# Patient Record
Sex: Male | Born: 1960 | Hispanic: Yes | Smoking: Former smoker
Health system: Southern US, Community
[De-identification: ages and names within clinical notes are randomized; demographics above are authoritative.]

## PROBLEM LIST (undated history)

## (undated) DIAGNOSIS — F32A Depression, unspecified: Secondary | ICD-10-CM

## (undated) DIAGNOSIS — K219 Gastro-esophageal reflux disease without esophagitis: Secondary | ICD-10-CM

## (undated) DIAGNOSIS — G629 Polyneuropathy, unspecified: Secondary | ICD-10-CM

## (undated) DIAGNOSIS — I10 Essential (primary) hypertension: Secondary | ICD-10-CM

## (undated) DIAGNOSIS — Z87442 Personal history of urinary calculi: Secondary | ICD-10-CM

## (undated) DIAGNOSIS — N200 Calculus of kidney: Secondary | ICD-10-CM

## (undated) DIAGNOSIS — M1990 Unspecified osteoarthritis, unspecified site: Secondary | ICD-10-CM

## (undated) DIAGNOSIS — M86671 Other chronic osteomyelitis, right ankle and foot: Secondary | ICD-10-CM

## (undated) DIAGNOSIS — Z9889 Other specified postprocedural states: Secondary | ICD-10-CM

## (undated) DIAGNOSIS — F419 Anxiety disorder, unspecified: Secondary | ICD-10-CM

## (undated) DIAGNOSIS — R51 Headache: Secondary | ICD-10-CM

## (undated) DIAGNOSIS — R112 Nausea with vomiting, unspecified: Secondary | ICD-10-CM

---

## 2003-12-25 ENCOUNTER — Ambulatory Visit (HOSPITAL_COMMUNITY): Admission: RE | Admit: 2003-12-25 | Discharge: 2003-12-25 | Payer: Self-pay | Admitting: Family Medicine

## 2006-11-10 ENCOUNTER — Observation Stay (HOSPITAL_COMMUNITY): Admission: EM | Admit: 2006-11-10 | Discharge: 2006-11-11 | Payer: Self-pay | Admitting: Emergency Medicine

## 2006-11-10 ENCOUNTER — Encounter: Payer: Self-pay | Admitting: Emergency Medicine

## 2007-01-21 HISTORY — PX: CARDIAC CATHETERIZATION: SHX172

## 2008-08-14 ENCOUNTER — Inpatient Hospital Stay (HOSPITAL_COMMUNITY): Admission: EM | Admit: 2008-08-14 | Discharge: 2008-08-17 | Payer: Self-pay | Admitting: Diagnostic Radiology

## 2010-04-28 LAB — URINALYSIS, ROUTINE W REFLEX MICROSCOPIC
Bilirubin Urine: NEGATIVE
Glucose, UA: >1000 mg/dL — AB
Ketones, ur: NEGATIVE mg/dL
Leukocytes, UA: NEGATIVE
Nitrite: NEGATIVE
Protein, ur: NEGATIVE mg/dL
Specific Gravity, Urine: 1.005 (ref 1.005–1.030)
Urobilinogen, UA: 0.2 mg/dL (ref 0.0–1.0)
pH: 6 (ref 5.0–8.0)

## 2010-04-28 LAB — GLUCOSE, CAPILLARY
Glucose-Capillary: 179 mg/dL — ABNORMAL HIGH (ref 70–99)
Glucose-Capillary: 203 mg/dL — ABNORMAL HIGH (ref 70–99)
Glucose-Capillary: 225 mg/dL — ABNORMAL HIGH (ref 70–99)
Glucose-Capillary: 259 mg/dL — ABNORMAL HIGH (ref 70–99)
Glucose-Capillary: 298 mg/dL — ABNORMAL HIGH (ref 70–99)
Glucose-Capillary: 319 mg/dL — ABNORMAL HIGH (ref 70–99)
Glucose-Capillary: 333 mg/dL — ABNORMAL HIGH (ref 70–99)
Glucose-Capillary: 336 mg/dL — ABNORMAL HIGH (ref 70–99)
Glucose-Capillary: 369 mg/dL — ABNORMAL HIGH (ref 70–99)
Glucose-Capillary: 370 mg/dL — ABNORMAL HIGH (ref 70–99)
Glucose-Capillary: 393 mg/dL — ABNORMAL HIGH (ref 70–99)
Glucose-Capillary: 411 mg/dL — ABNORMAL HIGH (ref 70–99)
Glucose-Capillary: >600 mg/dL (ref 70–99)
Glucose-Capillary: >600 mg/dL (ref 70–99)

## 2010-04-28 LAB — BASIC METABOLIC PANEL
BUN: 15 mg/dL (ref 6–23)
BUN: 16 mg/dL (ref 6–23)
BUN: 21 mg/dL (ref 6–23)
CO2: 25 mEq/L (ref 19–32)
CO2: 30 mEq/L (ref 19–32)
CO2: 32 mEq/L (ref 19–32)
Calcium: 10.3 mg/dL (ref 8.4–10.5)
Calcium: 8.9 mg/dL (ref 8.4–10.5)
Calcium: 9 mg/dL (ref 8.4–10.5)
Chloride: 87 mEq/L — ABNORMAL LOW (ref 96–112)
Chloride: 97 mEq/L (ref 96–112)
Chloride: 97 mEq/L (ref 96–112)
Creatinine, Ser: 0.89 mg/dL (ref 0.4–1.5)
Creatinine, Ser: 0.94 mg/dL (ref 0.4–1.5)
Creatinine, Ser: 1.22 mg/dL (ref 0.4–1.5)
GFR calc Af Amer: >60 mL/min (ref >60)
GFR calc Af Amer: >60 mL/min (ref >60)
GFR calc Af Amer: >60 mL/min (ref >60)
GFR calc non Af Amer: >60 mL/min (ref >60)
GFR calc non Af Amer: >60 mL/min (ref >60)
GFR calc non Af Amer: >60 mL/min (ref >60)
Glucose, Bld: 210 mg/dL — ABNORMAL HIGH (ref 70–99)
Glucose, Bld: 379 mg/dL — ABNORMAL HIGH (ref 70–99)
Glucose, Bld: 924 mg/dL (ref 70–99)
Potassium: 3.7 mEq/L (ref 3.5–5.1)
Potassium: 3.8 mEq/L (ref 3.5–5.1)
Potassium: 5.1 mEq/L (ref 3.5–5.1)
Sodium: 124 mEq/L — ABNORMAL LOW (ref 135–145)
Sodium: 134 mEq/L — ABNORMAL LOW (ref 135–145)
Sodium: 135 mEq/L (ref 135–145)

## 2010-04-28 LAB — DIFFERENTIAL
Basophils Absolute: 0 10*3/uL (ref 0.0–0.1)
Basophils Relative: 0 % (ref 0–1)
Basophils Relative: 1 % (ref 0–1)
Eosinophils Absolute: 0 10*3/uL (ref 0.0–0.7)
Eosinophils Relative: 0 % (ref 0–5)
Lymphocytes Relative: 27 % (ref 12–46)
Lymphocytes Relative: 33 % (ref 12–46)
Lymphs Abs: 2.5 10*3/uL (ref 0.7–4.0)
Lymphs Abs: 2.6 10*3/uL (ref 0.7–4.0)
Monocytes Absolute: 0.5 10*3/uL (ref 0.1–1.0)
Monocytes Absolute: 0.6 10*3/uL (ref 0.1–1.0)
Monocytes Relative: 6 % (ref 3–12)
Monocytes Relative: 7 % (ref 3–12)
Neutro Abs: 4.2 10*3/uL (ref 1.7–7.7)
Neutro Abs: 6.5 10*3/uL (ref 1.7–7.7)
Neutrophils Relative %: 57 % (ref 43–77)
Neutrophils Relative %: 67 % (ref 43–77)

## 2010-04-28 LAB — CBC
HCT: 43.6 % (ref 39.0–52.0)
HCT: 50.2 % (ref 39.0–52.0)
Hemoglobin: 15.4 g/dL (ref 13.0–17.0)
Hemoglobin: 17.5 g/dL — ABNORMAL HIGH (ref 13.0–17.0)
MCHC: 35 g/dL (ref 30.0–36.0)
MCHC: 35.4 g/dL (ref 30.0–36.0)
MCV: 89.8 fL (ref 78.0–100.0)
MCV: 90.5 fL (ref 78.0–100.0)
Platelets: 123 10*3/uL — ABNORMAL LOW (ref 150–400)
Platelets: 146 10*3/uL — ABNORMAL LOW (ref 150–400)
RBC: 4.85 MIL/uL (ref 4.22–5.81)
RBC: 5.55 MIL/uL (ref 4.22–5.81)
RDW: 13.1 % (ref 11.5–15.5)
RDW: 13.2 % (ref 11.5–15.5)
WBC: 7.5 10*3/uL (ref 4.0–10.5)
WBC: 9.7 10*3/uL (ref 4.0–10.5)

## 2010-04-28 LAB — CARDIAC PANEL(CRET KIN+CKTOT+MB+TROPI)
CK, MB: 3 ng/mL (ref 0.3–4.0)
Relative Index: 2 (ref 0.0–2.5)
Relative Index: 2.4 (ref 0.0–2.5)
Total CK: 108 U/L (ref 7–232)
Total CK: 124 U/L (ref 7–232)
Troponin I: 0.02 ng/mL (ref 0.00–0.06)
Troponin I: 0.02 ng/mL (ref 0.00–0.06)

## 2010-04-28 LAB — HEMOGLOBIN A1C
Hgb A1c MFr Bld: 11.9 % — ABNORMAL HIGH (ref 4.6–6.1)
Mean Plasma Glucose: 295 mg/dL
Mean Plasma Glucose: 301 mg/dL

## 2010-04-28 LAB — LIPID PANEL
Triglycerides: 792 mg/dL — ABNORMAL HIGH (ref <150)
VLDL: UNDETERMINED mg/dL (ref 0–40)

## 2010-04-28 LAB — PROTIME-INR
INR: 1.1 (ref 0.00–1.49)
Prothrombin Time: 14.2 seconds (ref 11.6–15.2)

## 2010-04-28 LAB — PHOSPHORUS: Phosphorus: 1.3 mg/dL — ABNORMAL LOW (ref 2.3–4.6)

## 2010-04-28 LAB — URINE MICROSCOPIC-ADD ON

## 2010-04-28 LAB — MAGNESIUM
Magnesium: 1.9 mg/dL (ref 1.5–2.5)
Magnesium: 2.4 mg/dL (ref 1.5–2.5)

## 2010-04-28 LAB — KETONES, QUALITATIVE: Acetone, Bld: NEGATIVE

## 2010-06-04 NOTE — Cardiovascular Report (Signed)
NAMEVITOR, [name] NO.:  [id number]   MEDICAL RECORD NO.:  [id number]          PATIENT TYPE:  INP   LOCATION:  3705                         FACILITY:  [facility]   PHYSICIAN:  Nanetta Batty, M.D.   DATE OF BIRTH:  Nov 11, 1960   DATE OF PROCEDURE:  11/10/2006  DATE OF DISCHARGE:  11/10/2006                            CARDIAC CATHETERIZATION   HISTORY:  [name] is a 50 year old mildly overweight Hispanic  male, father of 3, who was sent by [facility] from [facility] ER with  waxing and waning chest pain, with left upper extremity radiation.  He  was placed on IV heparin and nitro.  His EKG showed no acute changes.  He had normal enzymes.  He presents now for diagnostic coronary  arteriography to define his anatomy and rule out ischemic etiology.   HEMODYNAMICS:  1. Aortic systolic pressure 123, diastolic pressure 71.  2. Left ventricle systolic pressure 116, end-diastolic pressure 18.   SELECTIVE CHOLANGIOGRAPHY:  1. Left main normal.  2. LAD normal.  3. Left circumflex was codominant normal.  4. Ramus branch was moderate in size and normal.  5. Right coronary artery was codominant normal.   Left ventriculography; RAO, left ventriculogram was performed using 25  mL of Visipaque dye 12 mL per second.  The overall LVEF was estimated  approximately 60% without focal wall motion abnormalities.  Supravalvular aortography; performed in the LAO view using 25 mL of  Visipaque dye 20 mL per second.  There is no evidence of aortic  dissection.  Arch vessels appeared intact.   IMPRESSION:  [name] has essentially normal coronary arteries and  normal LV function without evidence of dissection.  I believe his chest  pain is noncardiac.  His ACT was measured at less than 200 and the  sheath was removed.  Pressure was held on the groin to achieve  hemostasis.  The patient left the lab in stable condition.  Heparin will  be discontinued as well as the nitro.  We  will obtain a D-dimer and  treat him empirically for reflux as well as some musculoskeletal chest  pain.  He left the lab in stable condition.      Nanetta Batty, M.D.  Electronically Signed     JB/MEDQ  D:  11/10/2006  T:  11/11/2006  Job:  [id number]   cc:   Kirk Ruths, M.D.

## 2010-06-04 NOTE — Group Therapy Note (Signed)
[name], [name]            ACCOUNT NO.:  [id number]   MEDICAL RECORD NO.:  [id number]          PATIENT TYPE:  INP   LOCATION:  A314                          FACILITY:  [facility]   PHYSICIAN:  Melissa L. Ladona Ridgel, MD  DATE OF BIRTH:  03-20-1960   DATE OF PROCEDURE:  08/15/2008  DATE OF DISCHARGE:                                 PROGRESS NOTE   Please see the accompanying written note.   SUBJECTIVE:  The patient describes a bit of a headache today.  Otherwise, he has had no other complaints.  His blood glucoses have  remained in the 200s to 300s.  He describes no nausea, vomiting and no  abdominal pain.  The patient is scheduled to see the diabetic educator  this afternoon.   PHYSICAL EXAMINATION:  VITAL SIGNS:  Temperature was 97.3, blood  pressure 121/75, pulse 84, respirations 18.  Saturation 95%.  GENERAL:  This is an obese Hispanic male in no acute distress.  HEENT:  Normocephalic, atraumatic.  Pupils equal, round and reactive to  light.  Extraocular muscles intact.  Mucous membranes are moist.  NECK:  Supple.  There was no JVD.  No lymph nodes.  No carotid bruits.  CHEST:  Decreased, but clear to auscultation.  There is no rhonchi,  rales or wheezes.  CARDIOVASCULAR:  Regular rate and rhythm.  Positive  S1-S2.  No S3-S4.  No murmurs, rubs or gallops.  ABDOMEN:  Obese, nontender, nondistended with positive bowel sounds.  There is no hepatosplenomegaly, no guarding, no rebound.  EXTREMITIES:  Show no clubbing, cyanosis or edema.  NEUROLOGICAL:  He is awake, alert and oriented.  Cranial nerves II-XII  are intact.  Power is 5/5.  DTRs are 2+.  Plantars are downgoing.   PERTINENT LABORATORIES:  Lipid profile:  Total cholesterol is 208,  triglycerides are 792, HDL 26, LDL cannot be calculated.  His hemoglobin  A1c is 11.9.  Cardiac markers have all been negative.  Phosphorus was  1.3, magnesium was 1.9, sodium was 134, potassium 3.8, chloride 97, CO2  of 32, glucose 379, BUN was  15, creatinine 0.94, PTT was 26, INR 14.2  with an __________of 1.1.  White count showed a WBC count of 7.5,  hemoglobin of 15.9, hematocrit 43.6 and platelets of 123.   ASSESSMENT/PLAN:  This is a 50 year old Hispanic male with newly  discovered diabetes.  I doubt that this is actually new onset.  It  appears that he has been having high blood sugars for some time based on  his hemoglobin A1c.  The patient has been started on metformin,  glipizide and I have increased his sliding scale insulin to moderate.  I  will keep the patient in the hospital overnight as his blood sugars are  still poorly controlled.  We will start him on Lantus this evening and  see how he does.   1. Diabetes.  The patient has an ACE inhibitor in place and has been      started on metformin and glipizide.  We will add Lantus this      evening.  2. Headache, likely secondary to  the metabolic changes related to      having lower blood sugar.  We will continue to monitor that.  I do      not think he needs to have a head CT.  3. High triglycerides.  We may elect to put the patient on medication      related to this.  At this time, I am going to just concentrate on      his glucoses, but in morning I may discharge him on a lipid agent.   Total time with this patient was 30 minutes.      Melissa L. Ladona Ridgel, MD  Electronically Signed     MLT/MEDQ  D:  08/16/2008  T:  08/16/2008  Job:  [id number]

## 2010-06-04 NOTE — H&P (Signed)
[name], [name]            ACCOUNT NO.:  [id number]   MEDICAL RECORD NO.:  [id number]          PATIENT TYPE:  INP   LOCATION:  A314                          FACILITY:  [facility]   PHYSICIAN:  Melissa L. Ladona Ridgel, MD  DATE OF BIRTH:  1960-10-09   DATE OF ADMISSION:  08/14/2008  DATE OF DISCHARGE:  LH                              HISTORY & PHYSICAL   CHIEF COMPLAINT:  I have been feeling bad for 2 weeks.   HISTORY OF PRESENT ILLNESS:  The patient is a 50 year old Hispanic male  with a past medical history significant for hypertension and arthritis  who presents to the emergency room with increasing urination, weakness,  mouth dryness, increased water intake, no dysuria, but polyuria.  He had  some nausea and vomiting last night and today and so he came to the  emergency room for further care.  He denied any diarrhea.  The patient  evidently had not mentioned this to Dr. Regino Schultze or [name].  He was  evidently seen 3 weeks ago for upper respiratory symptoms but at that  time did not have these symptoms.   REVIEW OF SYSTEMS:  In the last 3 months he has lost 32 pounds.  He has  had no fever, no chills but has had some night sweats.  He has had some  blurred vision.  EYES:  No double vision.  EAR, NOSE AND THROAT:  No  tinnitus, dysphagia, discharge.  CARDIOVASCULAR:  No chest pain or  palpitations.  RESPIRATORY:  No shortness of breath or cough at least  this week, he did have that 3 weeks ago.  NEUROLOGICALLY:  No headache  or seizure disorder.  PSYCHIATRICALLY:  No depression or anxiety.  HEMATOLOGIC:  No bleeding disorders.  INTEGUMENTARY:  No rashes.   ALLERGIES:  No known drug allergies.   MEDICATIONS:  He does not know them but he takes one for blood pressure  and arthritis and he gets them at [facility].   PAST MEDICAL HISTORY:  1. Hypertension.  2. Three weeks ago treated for some sore of respiratory ailment.  3. Arthritis in the legs.   PAST SURGICAL HISTORY:  He  had a heart catheterization 1 year ago, that  was negative, because he was having chest pain.   SOCIAL HISTORY:  He works in [employer] representative.  He has children who are  grown but he does not have their phone numbers.  He does not smoke or  drink.  He has a sister who is the contact person, [phone].   FAMILY HISTORY:  Mom is deceased with a history of heart failure,  diabetes insulin-dependent.  Dad is deceased with a history of alcohol  abuse.   VITAL SIGNS:  Temperature 98.2, blood pressure 140/83, pulse 98,  respirations 20, saturation 95%.  GENERAL:  This is a morbidly obese Hispanic male in no acute distress,  normocephalic, atraumatic.  Pupils equal, round, reactive to light.  Extraocular muscles are intact, mucous membranes are moist.  NECK:  Supple.  There is no JVD, no lymph nodes, no carotid bruits.  EARS:  Reveal no discharge.  Tympanic membranes  are intact bilaterally.  NOSE:  Reveals septum midline without discharge.  MOUTH:  Reveals no oral lesions or lip lesions.  CHEST:  Decreased but clear to auscultation.  There is no rhonchi, rales  or wheezes.  CARDIOVASCULAR:  Regular rate, rhythm.  Positive S1-S2.  No S3, S4.  No  murmurs, rubs or gallops.  ABDOMEN:  Soft, obese, nontender, nondistended with positive bowel  sounds.  EXTREMITIES:  Show deformity of the fourth left digit on his foot from  birth.  NEUROLOGICALLY:  Awake, alert and oriented.  Cranial nerves II-XII are  intact.  Power is 5/5.  DTRs are 2+.  Plantars are downgoing.   PERTINENT LABORATORIES:  Magnesium is 2.4.  Urine shows 0-2 RBCs.  UA  shows greater than 1000 glucose, small blood, no leukoesterase,  nitrates.  Sodium is 124, potassium 5.1, chloride is 87, CO2 25, glucose  was 924.  BUN 21, creatinine 1.22, calcium is 10.3.  He has no ketones.  White count is 9.7, hemoglobin 17.5, hematocrit 50.2 and platelets of  146.   ASSESSMENT/PLAN:  This is a 47-year Hispanic male with classic symptoms  of new  onset diabetes, polyuria, polydipsia, weight loss over several  weeks to maybe a month.  1. Newly recognized diabetes type 2.  Will check a hemoglobin A1c.  He      will receive diabetic education.  Will continue the Glucommander      and then switch him to metformin and glipizide with sliding scale      insulin.  2. Hypertension.  Will need to obtain his home medications.  For now,      we are going to put him on an ACE inhibitor.  3. Arthritis.  Will check the home medication list.  4. Deep vein thrombosis.  Prophylaxis, ambulation and low dose      heparin, Lovenox.  5. Thrombocytopenia.  Will continue to monitor.   We will obtain his medication list from [facility]. Total time  on this case was 50 minutes.      Melissa L. Ladona Ridgel, MD  Electronically Signed     MLT/MEDQ  D:  08/14/2008  T:  08/14/2008  Job:  [id number]   cc:   Madelin Rear. Jerry Gambler, MD  Fax: [phone]   Kirk Ruths, M.D.  Fax: [phone]

## 2010-06-04 NOTE — Discharge Summary (Signed)
[name]            ACCOUNT NO.:  [id number]   MEDICAL RECORD NO.:  [id number]          PATIENT TYPE:  INP   LOCATION:  A314                          FACILITY:  [facility]   PHYSICIAN:  Melissa L. Ladona Ridgel, MD  DATE OF BIRTH:  29-May-1960   DATE OF ADMISSION:  08/14/2008  DATE OF DISCHARGE:  07/29/2010LH                               DISCHARGE SUMMARY   DISCHARGING DIAGNOSES:  1. New onset diabetes type 2.  The patient has been hospitalized for      several days initial with glucoses elevated at 924.  He required an      insulin drip to start and subsequently was transitioned to oral      tablets and Lantus insulin.  At this time, we have not achieved      what would be optimal blood glucose control, but he is well on his      way to achieving levels in acceptable range.  At this time, I have      discussed the case with his primary care physician who feels      comfortable taking over his care as an outpatient.  He recognizes      that his blood glucoses are elevated and will titrate his      medications.  I have arranged for him to have a walk in visit      tomorrow at his doctor's office.  When he discharges today, he will      go to the outpatient diabetic class.  2. Hypertension.  I have switched the patient from Norvasc to      lisinopril in order to renally protect him in light of his      diabetes.  3. 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  The      patient uses naproxen for his aches and pains.  I have asked him to      please use this cautiously has it can injure his kidneys especially      in light of his diabetes.  4. The patient has reflux.  He has been taking omeprazole.  We will      continue that.   The patient has by history taken alprazolam.  I will refer that back to  his primary care physician for anxiety.   MEDICATIONS AT THE TIME OF DISCHARGE:  1. Hydrocodone/APAP as per his primary care physician.  2. Omeprazole 20 mg daily.  3.  Naproxen 500 mg as per his primary care physician but should be      limited as it can cause renal difficulties.  4. Alprazolam 0.5 mg as directed by his primary care physician.  5. Lisinopril 10 mg once daily.  6. Lantus 40 units subcutaneously at bedtime.  7. Glucophage 500 mg twice daily.  8. Glipizide 5 mg twice daily.   The patient will be given prescriptions for all of his medications, a  prescription for his Glucometer of his choice, insulin syringes 2 boxes,  test strips 2 boxes, lancets 2 boxes, and alcohol pads 2 boxes.   CONSULTATIONS DURING THIS ADMISSION:  With  diabetic education.   HOSPITAL COURSE:  The patient is a pleasant, 50 year old, Hispanic male  who was admitted on July 26 after presenting to the Emergency Room with  complaints of polyuria, polydipsia, weakness, dry mouth, blurred vision.  He was found to have blood glucose that was 924.   The patient was admitted to the general medical floor.  He was started  on IV fluids and a Glucommander.  We were able to get his blood sugars  down into the 200 range.  The insulin drip was discontinued, and he was  started on sliding scale insulin in addition to Lantus.  Unfortunately,  his blood glucoses remained in the mid 200s despite titrating up oral  medications and Lantus.  I therefore have spoken with his primary care  physician who agrees that he can titrate his medications as an  outpatient.  We therefore will discharge the patient at this time  despite his elevated blood glucoses.  The patient himself is feeling  much better.  He states that his vision is improved.  He denied nausea  and vomiting.  He has a little bit of swelling in his feet but generally  is in no acute distress.   PHYSICAL EXAMINATION:  VITAL SIGNS:  On the day of discharge, the  patient's vital signs temperature is 97.6, blood pressure 121/81, pulse  73, respirations 20, saturation 95%.  GENERAL: This is a moderately obese, Hispanic male who  is in no acute  distress.  He is normocephalic, atraumatic.  Pupils equal, round, and  reactive to light.  Extraocular muscles appear to be intact.  He has  anicteric sclerae.  Examination of the ears reveal no external lesions  or discharge.  Examination of the nose reveals no external lesions or  discharge.  Examination of mouth reveals no oral lesions or lip lesions.  NECK:  Supple.  There is no JVD, no lymphadenopathy, no carotid bruits.  CHEST:  Clear to auscultation.  There is no rhonchi, rales, or wheezes.  CARDIOVASCULAR:  Regular rate and rhythm.  Positive S1, S2.  No S3, S4.  No murmurs, rubs, or gallops.  ABDOMEN:  Obese, nontender, nondistended with positive bowel sounds.  No  hepatosplenomegaly, no guarding, no rebound.  EXTREMITIES:  Trace if at all edema likely secondary to the IV fluid  hydration.   Pertinent laboratories during the course of the hospital stay, his last  hemoglobin A1c registered 12.1.  His lipid panel showed cholesterol 208,  triglycerides 792, HDL 26, LDL was not able to be obtained.  His cardiac  markers were negative.  His phosphorus was 1.3.  His magnesium was 1.9.  PTT was 1.6.  PT was 14.2 with an INR of 1.1.  The patient had no  ketones on admission.  His urinalysis was greater than 1000 of glucose,  a small amount of blood, but was otherwise negative.   At this time, the patient is deemed stable for discharge to his primary  care physician's care for titration of his diabetic medications.  I will  further increase his Lantus tonight to 40 units.  The patient may need  to have [facility] coverage; however, working as a Corporate investment banker, I think  this would be very prohibitive for him.  I would like to continue to up  titrate his Lantus and try to up titrate his oral medications before we  actually add insulin during the day.   DISPOSITION:  The patient will be discharged to home but in  the meantime  will be given information about the 3 o'clock  diabetes meeting that he  can attend, and he will follow up with Dr. Regino Schultze and Dr. Sherwood Gambler in the  morning.   CONDITION AT DISCHARGE:  Stable.   Please note that this discharge is less than 30 minutes.      Melissa L. Ladona Ridgel, MD  Electronically Signed     MLT/MEDQ  D:  08/17/2008  T:  08/17/2008  Job:  [id number]   cc:   Kirk Ruths, M.D.  Fax: [phone]   Madelin Rear. Sherwood Gambler, MD  Fax: [phone]

## 2010-06-04 NOTE — H&P (Signed)
[name], [name]            ACCOUNT NO.:  [id number]   MEDICAL RECORD NO.:  [id number]          PATIENT TYPE:  INP   LOCATION:  3705                         FACILITY:  [facility]   PHYSICIAN:  Nanetta Batty, M.D.   DATE OF BIRTH:  13-Aug-1960   DATE OF ADMISSION:  11/10/2006  DATE OF DISCHARGE:                              HISTORY & PHYSICAL   CHIEF COMPLAINT:  Left-sided chest pain.   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  He describes sharp left-  sided chest pain that awakened him from his sleep about 4 a.m.  This was  associated with some left arm numbness.  He denies any nausea, vomiting,  diaphoresis or shortness of breath.  He had symptoms off and on all  night.  Nitroglycerin seemed to help when he presented emergency room as  well as morphine en route.  His initial CK, MB and troponins are  negative.  His EKG shows no acute changes.   PAST MEDICAL HISTORY:  Remarkable for treated hypertension,.   He is not sure of his medications.   He has no known drug allergies.   SOCIAL HISTORY:  He is married, he works as a Buyer, retail.  He  used to smoke 2-3 packs a day and quit 3 years ago.  He has three  children and one grandchild.  He does not drink alcohol.   FAMILY HISTORY:  Remarkable for coronary disease, his mother died in her  66s of coronary disease.   REVIEW OF SYSTEMS:  He has a past history of kidney stones.  He also may  have non-insulin-dependent diabetes.  His primary care physician  recently advised him to lose weight and go on a diabetic diet.  He is  not on medications.   PHYSICAL EXAM:  Blood pressure 103/42, pulse 96, respirations 12.  GENERAL:  He is a well-developed, obese Hispanic male in no acute  distress.  HEENT:  Normocephalic.  Extraocular movements are intact.  Sclerae are  nonicteric.  NECK:  Without JVD or bruits.  CHEST:  Clear to auscultation and percussion.  CARDIAC:  Regular rate and rhythm without murmur, rub or gallop, normal  S1 and S2.  ABDOMEN:  Obese, nontender.  EXTREMITIES:  Without edema.  Distal pulses are intact.  NEUROLOGIC:  Grossly intact.  He is awake, alert and oriented and  cooperative.  Moves all extremities without obvious deficits.  SKIN:  Warm and dry.   EKG shows sinus rhythm without acute changes.   LAB:  White count 6.6, hemoglobin 15, hematocrit 43.6, platelets 178.  CK, MB and troponin are negative x2.  INR is 1.0.  Sodium 141, potassium  3.8, BUN 13, creatinine 0.9.  LFTs were normal.   IMPRESSION:  1. Unstable angina.  2. Obesity.  3. Non-insulin diabetes, diet-controlled.  4. Hypertension.  5. Ex-smoker.  6. Family history of coronary disease.   PLAN:  The patient will be admitted to telemetry and set up for a  diagnostic catheterization today.      [name], P.A.      Nanetta Batty, M.D.  Electronically Signed    LKK/MEDQ  D:  11/10/2006  T:  11/11/2006  Job:  [id number]

## 2010-06-04 NOTE — Discharge Summary (Signed)
[name], [name]            ACCOUNT NO.:  [id number]   MEDICAL RECORD NO.:  [id number]          PATIENT TYPE:  INP   LOCATION:  3705                         FACILITY:  [facility]   PHYSICIAN:  Nanetta Batty, M.D.   DATE OF BIRTH:  Aug 04, 1960   DATE OF ADMISSION:  11/10/2006  DATE OF DISCHARGE:  11/11/2006                               DISCHARGE SUMMARY   DISCHARGE DIAGNOSES:  1. Chest pain, normal coronaries and normal LV function at      catheterization.  2. Bilateral hand numbness, possibly carpal tunnel syndrome.  3. Morbid obesity.  4. Treated hypertension.  5. Former smoker  6. Family history of coronary disease.   HOSPITAL COURSE:  The patient is a 50 year old male who was transferred  from [facility] after he presented there with chest pain.  He  described left-sided chest pain that had awakened him from sleep.  He  works Holiday representative.  He had been doing some heavy lifting the previous  day.  He did seem to get some relief with nitroglycerin.  His labs were  negative, and his EKG was unremarkable.  He did have other risk factors  for coronary disease.  So we decided to proceed with diagnostic  catheterization.  This revealed normal coronaries and normal LV  function.  He had a normal aortic root with no evidence of dissection.  We feel he can be discharged November 11, 2006.   LABORATORY RESULTS:  Sodium 138, potassium 3.6, BUN 13, creatinine 0.9,  white count 7, hemoglobin 15.1, hematocrit 44.2, platelets 194, D-dimer  is negative, CK-MB and troponins were negative.  Chest x-ray showed no  active disease. An EKG shows sinus rhythm without acute changes. The INR  was 1.0, TSH is 1.07.   DISCHARGE MEDICATIONS:  The patient was on an antihypertensive at home  and a medicine for arthritis.  He will continue these.  We added  omeprazole 20 mg a day.  He was already on Xanax p.r.n. 0.5 mg and  Lortab p.r.n.  We did do C-spine films on him prior to discharge.  The  patient is to  follow-up with Dr. Regino Reilly regarding his hand numbness which sounds like  either carpal tunnel or possibly some DJD in his C-spineBradsher.  He  will follow up with Dr. Domingo Reilly in [place].  He may very well need a  sleep study at some point.      [name], P.A.      Nanetta Batty, M.D.  Electronically Signed    LKK/MEDQ  D:  11/11/2006  T:  11/12/2006  Job:  [id number]   cc:   [name], M.D.  Jerry Gobble, MD

## 2010-06-04 NOTE — Group Therapy Note (Signed)
[name], [name]            ACCOUNT NO.:  [id number]   MEDICAL RECORD NO.:  [id number]          PATIENT TYPE:  INP   LOCATION:  A314                          FACILITY:  [facility]   PHYSICIAN:  Melissa L. Ladona Ridgel, MD  DATE OF BIRTH:  Jun 07, 1960   DATE OF PROCEDURE:  DATE OF DISCHARGE:                                 PROGRESS NOTE   Subjectively, the patient is a little bit frustrated that his blood  glucose has remained elevated.  He cannot quite comprehend that despite  being on 1800 ADA calorie diet that he cannot control his sugars.  The  patient states he really just wants to eat nothing in order to get out  of the hospital.  I explained to him that it is not practical since  everything that he eats will be converted in some way, shape, or form to  something close to sugar and that obviously there are better foods that  would improve his diabetes and that we had to be realistic that he did  have to eat.  I tried to explain to him how his body also will be  recruiting glucose without eating.  At this time, the patient agrees to  stay in the hospital to continue to manage his blood glucoses which were  elevated throughout the course of the day despite oral medications and  subcu heparin.   PHYSICAL EXAMINATION:  VITAL SIGNS:  Temperature 98.3, blood pressure  131/79, pulse 83, respirations 18, saturation 97%.  His glucoses last  one was 203, 301, 278, and 276.  GENERAL:  This is a morbidly obese, Hispanic male in no acute distress.  He is frustrated, but he is manageable.  He is normocephalic,  atraumatic.  Pupils equal, round, and reactive to light.  Extraocular  muscles intact.  Mucous membranes are moist.  NECK:  Supple.  There is no JVD, no lymphadenopathy, no carotid bruits.  CHEST:  Clear to auscultation.  There is no rhonchi, rales, or wheezes.  CARDIOVASCULAR:  Regular rate and rhythm.  Positive S1, S2.  No S3, S4.  No murmurs, rubs, or gallops.  ABDOMEN:  Obese,  nontender, nondistended with positive bowel sounds.  There is no hepatosplenomegaly, no guarding, no rebound.  EXTREMITIES:  No clubbing, cyanosis, or edema.  NEUROLOGIC:  He is awake, alert, and oriented.  Cranial nerves II-XII  are intact.  Power is 5/5.  DTRs are 2+.  Plantars are downgoing.   Results of his hemoglobin A1c was repeated for whatever reason is 12.1.  There are no other new laboratories today.   ASSESSMENT/PLAN:  This is a 50 year old, Hispanic male who likely has  had significant uncontrolled diabetes for some time who presented to the  Emergency Room in a hyperglycemic state which was easily controlled with  IV insulin and IV fluids.  The patient subsequently has been started on  metformin and glipizide.  This will be titrated up today since blood  glucoses remain very elevated.   1. Diabetes type 2.  The patient is on an 1800 ADA diet.  He will      remain on  glipizide 5 mg in the morning and will add 5 mg in the      evening.  We have added metformin 500 b.i.d.  That can be further      titrated as needed.  I am going to increase his Lantus to 30 units      subcu and will see how he does with the marked sliding scale.  If      his blood glucoses are able to come down into the high 100s, I will      discharge him in the a.m. so that he can attend the outpatient      diabetes education class.  2. Visual blurring is improved.  The patient has canceled his eye      appointment and is planning to reschedule that in the near future      since he is in a state of flux at the moment.  3. Hypertriglyceridemia with a slightly elevated total cholesterol.      This really should be addressed dietarily and with the correction      of his glucose.  However, we may consider adding an oral agent.  4. Gastroesophageal reflux disease.  The patient will have Protonix      added, and he has had some Milk of Magnesia.   Total time with this patient was 25 minutes.   Plan is for  disposition hopefully out of the hospital tomorrow if his  blood glucoses are better controlled.      Melissa L. Ladona Ridgel, MD  Electronically Signed     MLT/MEDQ  D:  08/16/2008  T:  08/17/2008  Job:  [id number]

## 2010-08-24 ENCOUNTER — Emergency Department (HOSPITAL_COMMUNITY): Payer: Managed Care, Other (non HMO)

## 2010-08-24 ENCOUNTER — Encounter: Payer: Self-pay | Admitting: *Deleted

## 2010-08-24 ENCOUNTER — Other Ambulatory Visit: Payer: Self-pay

## 2010-08-24 ENCOUNTER — Inpatient Hospital Stay (HOSPITAL_COMMUNITY)
Admission: EM | Admit: 2010-08-24 | Discharge: 2010-08-26 | DRG: 382 | Disposition: A | Discharge status: Home / Self Care | Payer: Managed Care, Other (non HMO) | Attending: Internal Medicine | Admitting: Internal Medicine

## 2010-08-24 DIAGNOSIS — R739 Hyperglycemia, unspecified: Secondary | ICD-10-CM

## 2010-08-24 DIAGNOSIS — G8929 Other chronic pain: Secondary | ICD-10-CM | POA: Diagnosis present

## 2010-08-24 DIAGNOSIS — K8020 Calculus of gallbladder without cholecystitis without obstruction: Secondary | ICD-10-CM | POA: Diagnosis present

## 2010-08-24 DIAGNOSIS — R079 Chest pain, unspecified: Secondary | ICD-10-CM

## 2010-08-24 DIAGNOSIS — K2960 Other gastritis without bleeding: Secondary | ICD-10-CM | POA: Diagnosis present

## 2010-08-24 DIAGNOSIS — K2210 Ulcer of esophagus without bleeding: Principal | ICD-10-CM | POA: Diagnosis present

## 2010-08-24 DIAGNOSIS — E119 Type 2 diabetes mellitus without complications: Secondary | ICD-10-CM | POA: Diagnosis present

## 2010-08-24 DIAGNOSIS — I10 Essential (primary) hypertension: Secondary | ICD-10-CM | POA: Diagnosis present

## 2010-08-24 HISTORY — DX: Essential (primary) hypertension: I10

## 2010-08-24 HISTORY — DX: Unspecified osteoarthritis, unspecified site: M19.90

## 2010-08-24 HISTORY — DX: Calculus of kidney: N20.0

## 2010-08-24 LAB — URINALYSIS, ROUTINE W REFLEX MICROSCOPIC
Leukocytes, UA: NEGATIVE
Nitrite: NEGATIVE
Protein, ur: NEGATIVE mg/dL
Urobilinogen, UA: 0.2 mg/dL (ref 0.0–1.0)

## 2010-08-24 LAB — CBC
HCT: 43.7 % (ref 39.0–52.0)
Hemoglobin: 15.1 g/dL (ref 13.0–17.0)
RBC: 4.9 MIL/uL (ref 4.22–5.81)

## 2010-08-24 LAB — BASIC METABOLIC PANEL
CO2: 21 mEq/L (ref 19–32)
Glucose, Bld: 411 mg/dL — ABNORMAL HIGH (ref 70–99)
Potassium: 3.5 mEq/L (ref 3.5–5.1)
Sodium: 137 mEq/L (ref 135–145)

## 2010-08-24 LAB — CK TOTAL AND CKMB (NOT AT ARMC)
CK, MB: 3.1 ng/mL (ref 0.3–4.0)
Relative Index: 2.4 (ref 0.0–2.5)
Total CK: 129 U/L (ref 7–232)

## 2010-08-24 LAB — URINE MICROSCOPIC-ADD ON

## 2010-08-24 MED ORDER — IOHEXOL 350 MG/ML SOLN
100.0000 mL | Freq: Once | INTRAVENOUS | Status: AC | PRN
  Administered 2010-08-24: 100 mL via INTRAVENOUS

## 2010-08-24 MED ORDER — ONDANSETRON HCL 4 MG/2ML IJ SOLN
4.0000 mg | Freq: Once | INTRAMUSCULAR | Status: AC
  Administered 2010-08-24: 4 mg via INTRAVENOUS
  Filled 2010-08-24: qty 2

## 2010-08-24 MED ORDER — PANTOPRAZOLE SODIUM 40 MG IV SOLR
40.0000 mg | Freq: Once | INTRAVENOUS | Status: AC
  Administered 2010-08-24: 40 mg via INTRAVENOUS
  Filled 2010-08-24: qty 40

## 2010-08-24 MED ORDER — GI COCKTAIL ~~LOC~~
30.0000 mL | Freq: Once | ORAL | Status: AC
  Administered 2010-08-25: 30 mL via ORAL
  Filled 2010-08-24: qty 30

## 2010-08-24 MED ORDER — ASPIRIN EC 325 MG PO TBEC
325.0000 mg | DELAYED_RELEASE_TABLET | Freq: Once | ORAL | Status: AC
  Administered 2010-08-25: 325 mg via ORAL
  Filled 2010-08-24: qty 1

## 2010-08-24 MED ORDER — INSULIN REGULAR HUMAN 100 UNIT/ML IJ SOLN
10.0000 [IU] | Freq: Once | INTRAMUSCULAR | Status: AC
  Administered 2010-08-25: 10 [IU] via SUBCUTANEOUS
  Filled 2010-08-24: qty 10

## 2010-08-24 MED ORDER — HYDROMORPHONE HCL 1 MG/ML IJ SOLN
1.0000 mg | Freq: Once | INTRAMUSCULAR | Status: AC
  Administered 2010-08-25: 1 mg via INTRAVENOUS
  Filled 2010-08-24: qty 1

## 2010-08-24 MED ORDER — MORPHINE SULFATE 4 MG/ML IJ SOLN
4.0000 mg | Freq: Once | INTRAMUSCULAR | Status: AC
  Administered 2010-08-24: 4 mg via INTRAVENOUS
  Filled 2010-08-24: qty 1

## 2010-08-24 NOTE — ED Provider Notes (Addendum)
History     CSN: [id number] Arrival date & time: 08/24/2010  8:32 PM  Chief Complaint  Patient presents with  . Chest Pain   Patient is a 50 y.o. male presenting with chest pain. The history is provided by the patient.  Chest Pain The chest pain began yesterday. Duration of episode(s) is 1 day. Chest pain occurs constantly. The chest pain is worsening. Associated with: swallowing makes it worse. At its most intense, the pain is at 10/10. The pain is currently at 10/10. The severity of the pain is severe. The quality of the pain is described as crushing. The pain radiates to the mid back. Chest pain is worsened by deep breathing and stress. Primary symptoms include shortness of breath. Pertinent negatives for primary symptoms include no fever, no syncope, no abdominal pain and no vomiting.  Pertinent negatives for associated symptoms include no diaphoresis and no orthopnea. He tried nothing for the symptoms. Risk factors include alcohol intake and stress.  His past medical history is significant for diabetes.  Pertinent negatives for past medical history include no CAD.  Procedure history is positive for cardiac catheterization.   severe pain that radiates to his back  Past Medical History  Diagnosis Date  . Diabetes mellitus     History reviewed. No pertinent past surgical history.  Family History  Problem Relation Age of Onset  . Diabetes Mother   . Heart failure Mother   . Heart failure Father     History  Substance Use Topics  . Smoking status: Former Games developer  . Smokeless tobacco: Not on file  . Alcohol Use: No      Review of Systems  Constitutional: Negative for fever, chills and diaphoresis.  HENT: Negative for neck pain and neck stiffness.   Eyes: Negative for pain.  Respiratory: Positive for shortness of breath.   Cardiovascular: Positive for chest pain. Negative for orthopnea and syncope.  Gastrointestinal: Negative for vomiting and abdominal pain.    Genitourinary: Negative for dysuria.  Musculoskeletal: Negative for back pain.  Skin: Negative for rash.  Neurological: Negative for headaches.  All other systems reviewed and are negative.    Physical Exam  BP 160/93  Pulse 96  Temp(Src) 97.8 F (36.6 C) (Oral)  Resp 20  Ht 5\' 9"  (1.753 m)  Wt 253 lb (114.76 kg)  BMI 37.36 kg/m2  SpO2 96%  Physical Exam  Constitutional: He is oriented to person, place, and time. He appears well-developed and well-nourished.  HENT:  Head: Normocephalic and atraumatic.  Eyes: Conjunctivae and EOM are normal. Pupils are equal, round, and reactive to light.  Neck: Trachea normal. Neck supple. No thyromegaly present.  Cardiovascular: Normal rate, regular rhythm, S1 normal, S2 normal and normal pulses.     No systolic murmur is present   No diastolic murmur is present  Pulses:      Radial pulses are 2+ on the right side, and 2+ on the left side.  Pulmonary/Chest: Effort normal and breath sounds normal. He has no wheezes. He has no rhonchi. He has no rales. He exhibits no tenderness.  Abdominal: Soft. Normal appearance and bowel sounds are normal. There is no tenderness. There is no CVA tenderness and negative Murphy's sign.  Musculoskeletal:       BLE:s Calves nontender, no cords or erythema, negative Homans sign  Neurological: He is alert and oriented to person, place, and time. He has normal strength. No cranial nerve deficit or sensory deficit. GCS eye subscore is 4. GCS  verbal subscore is 5. GCS motor subscore is 6.  Skin: Skin is warm and dry. No rash noted. He is not diaphoretic.  Psychiatric: His speech is normal.       Cooperative and appropriate    ED Course  Procedures   Date: 08/24/2010  Rate: 95  Rhythm: normal sinus rhythm  QRS Axis: normal  Intervals: normal  ST/T Wave abnormalities: nonspecific ST changes  Conduction Disutrbances:none  Narrative Interpretation:   Old EKG Reviewed: none available    MDM CP radiates  to his back described as severe. ECG reviewed no ischemia. Sent for CTA to eval for dissection. Labs obtained and reviewed has sig hyperglycemia but no dissection.   On recheck having persistent pain, MED c/s for admit. D/w Dr Rito Ehrlich will admit 12:00 AM  Calio insulin for hyperglycemia    Results for orders placed during the hospital encounter of 08/24/10  CBC      Component Value Range   WBC 8.4  4.0 - 10.5 (K/uL)   RBC 4.90  4.22 - 5.81 (MIL/uL)   Hemoglobin 15.1  13.0 - 17.0 (g/dL)   HCT 96.0  45.4 - 09.8 (%)   MCV 89.2  78.0 - 100.0 (fL)   MCH 30.8  26.0 - 34.0 (pg)   MCHC 34.6  30.0 - 36.0 (g/dL)   RDW 11.9  14.7 - 82.9 (%)   Platelets 147 (*) 150 - 400 (K/uL)  BASIC METABOLIC PANEL      Component Value Range   Sodium 137  135 - 145 (mEq/L)   Potassium 3.5  3.5 - 5.1 (mEq/L)   Chloride 98  96 - 112 (mEq/L)   CO2 21  19 - 32 (mEq/L)   Glucose, Bld 411 (*) 70 - 99 (mg/dL)   BUN 11  6 - 23 (mg/dL)   Creatinine, Ser 5.62  0.50 - 1.35 (mg/dL)   Calcium 9.8  8.4 - 13.0 (mg/dL)   GFR calc non Af Amer >60  >60 (mL/min)   GFR calc Af Amer >60  >60 (mL/min)  TROPONIN I      Component Value Range   Troponin I <0.30  <0.30 (ng/mL)  CK TOTAL AND CKMB      Component Value Range   Total CK 129  7 - 232 (U/L)   CK, MB 3.1  0.3 - 4.0 (ng/mL)   Relative Index 2.4  0.0 - 2.5   URINALYSIS, ROUTINE W REFLEX MICROSCOPIC      Component Value Range   Color, Urine YELLOW  YELLOW    Appearance CLEAR  CLEAR    Specific Gravity, Urine 1.015  1.005 - 1.030    pH 6.5  5.0 - 8.0    Glucose, UA >1000 (*) NEGATIVE (mg/dL)   Hgb urine dipstick NEGATIVE  NEGATIVE    Bilirubin Urine NEGATIVE  NEGATIVE    Ketones, ur NEGATIVE  NEGATIVE (mg/dL)   Protein, ur NEGATIVE  NEGATIVE (mg/dL)   Urobilinogen, UA 0.2  0.0 - 1.0 (mg/dL)   Nitrite NEGATIVE  NEGATIVE    Leukocytes, UA NEGATIVE  NEGATIVE   URINE MICROSCOPIC-ADD ON      Component Value Range   WBC, UA 3-6  <3 (WBC/hpf)   Bacteria, UA FEW (*)  RARE    Ct Angio Chest W/cm &/or Wo Cm  08/24/2010  *RADIOLOGY REPORT*  Clinical Data:  Chest pain, radiating to back, evaluate for dissection  CT ANGIOGRAPHY CHEST, ABDOMEN AND PELVIS  Technique:  Multidetector CT imaging through the chest,  abdomen and pelvis was performed using the standard protocol during bolus administration of intravenous contrast.  Multiplanar reconstructed images including MIPs were obtained and reviewed to evaluate the vascular anatomy.  Contrast: 100 ml Omnipaque-300 IV  Comparison:  Chest radiograph dated 11/10/2006  CTA CHEST  Findings:  No evidence of aortic dissection.  No evidence of pulmonary embolism.  The lungs are essentially clear.  No suspicious pulmonary nodules. No pleural effusion or pneumothorax.  Visualized thyroid is unremarkable.  The heart is normal in size.  No pericardial effusion.  Coronary atherosclerosis.  No mediastinal, hilar, or axillary lymphadenopathy.  Degenerative changes of the thoracic spine.   Review of the MIP images confirms the above findings.  IMPRESSION: No evidence of aortic dissection.  No evidence of pulmonary embolism.  CTA ABDOMEN AND PELVIS  Findings:  No evidence of aortic dissection.  Classic hepatic arterial anatomy.  The celiac axis, SMA, and IMA are patent.  Single bilateral renal arteries, patent.  Bilateral common iliac arteries are patent.  Hepatic steatosis.  Liver, pancreas, adrenal glands are within normal limits.  Cholelithiasis, without associated inflammatory changes.  No intrahepatic or extrahepatic ductal dilatation.  Kidneys within normal limits.  No hydronephrosis.  No evidence of bowel obstruction.  Normal appendix.  Colonic diverticulosis, without associated inflammatory changes.  No abdominopelvic ascites.  No suspicious abdominopelvic lymphadenopathy.  Prostate is unremarkable.  Bladder is within normal limits.  Degenerative changes of the lumbar spine.  Bilateral pars defects at L5.   Review of the MIP images confirms the  above findings.  IMPRESSION: No evidence of aortic dissection.  Hepatic steatosis.  Cholelithiasis, without associated inflammatory changes.  Original Report Authenticated By: Charline Bills, M.D.    CT noted no RUQ pain and neg Murphy's sign. No [place] available tonight. Plan Admit     Sunnie Nielsen, MD 08/25/10 0001  Sunnie Nielsen, MD 08/25/10 [phone]

## 2010-08-24 NOTE — ED Notes (Signed)
Pt c/o chest pain x 2 days. States that it started radiating through to his back yesterday. Pt also c/o worsening pain with taking po fluids and swallowing. Denies nausea and shortness of breath.

## 2010-08-25 ENCOUNTER — Encounter (HOSPITAL_COMMUNITY)
Admission: EM | Disposition: A | Discharge status: Home / Self Care | Payer: Self-pay | Source: Home / Self Care | Attending: Internal Medicine

## 2010-08-25 ENCOUNTER — Encounter (HOSPITAL_COMMUNITY): Payer: Self-pay | Admitting: Internal Medicine

## 2010-08-25 ENCOUNTER — Other Ambulatory Visit (INDEPENDENT_AMBULATORY_CARE_PROVIDER_SITE_OTHER): Payer: Self-pay | Admitting: Internal Medicine

## 2010-08-25 DIAGNOSIS — G8929 Other chronic pain: Secondary | ICD-10-CM | POA: Diagnosis present

## 2010-08-25 DIAGNOSIS — R0789 Other chest pain: Secondary | ICD-10-CM

## 2010-08-25 DIAGNOSIS — E119 Type 2 diabetes mellitus without complications: Secondary | ICD-10-CM | POA: Diagnosis present

## 2010-08-25 DIAGNOSIS — R1312 Dysphagia, oropharyngeal phase: Secondary | ICD-10-CM

## 2010-08-25 LAB — COMPREHENSIVE METABOLIC PANEL
ALT: 52 U/L (ref 0–53)
AST: 34 U/L (ref 0–37)
Albumin: 3.1 g/dL — ABNORMAL LOW (ref 3.5–5.2)
Alkaline Phosphatase: 60 U/L (ref 39–117)
Potassium: 3.3 mEq/L — ABNORMAL LOW (ref 3.5–5.1)
Sodium: 140 mEq/L (ref 135–145)
Total Protein: 6.3 g/dL (ref 6.0–8.3)

## 2010-08-25 LAB — GLUCOSE, CAPILLARY
Glucose-Capillary: 207 mg/dL — ABNORMAL HIGH (ref 70–99)
Glucose-Capillary: 200 mg/dL — ABNORMAL HIGH (ref 70–99)
Glucose-Capillary: 166 mg/dL — ABNORMAL HIGH (ref 70–99)
Glucose-Capillary: 300 mg/dL — ABNORMAL HIGH (ref 70–99)

## 2010-08-25 LAB — HEPATIC FUNCTION PANEL
AST: 33 U/L (ref 0–37)
Bilirubin, Direct: 0.1 mg/dL (ref 0.0–0.3)
Total Bilirubin: 0.5 mg/dL (ref 0.3–1.2)

## 2010-08-25 LAB — LIPID PANEL
LDL Cholesterol: UNDETERMINED mg/dL (ref 0–99)
VLDL: UNDETERMINED mg/dL (ref 0–40)

## 2010-08-25 LAB — HEMOGLOBIN A1C
Hgb A1c MFr Bld: 10.2 % — ABNORMAL HIGH (ref <5.7)
Mean Plasma Glucose: 246 mg/dL — ABNORMAL HIGH (ref <117)

## 2010-08-25 LAB — CBC
HCT: 40.4 % (ref 39.0–52.0)
MCH: 30.2 pg (ref 26.0–34.0)
MCHC: 33.4 g/dL (ref 30.0–36.0)
MCV: 90.4 fL (ref 78.0–100.0)
RDW: 13.4 % (ref 11.5–15.5)
WBC: 6.7 10*3/uL (ref 4.0–10.5)

## 2010-08-25 LAB — CARDIAC PANEL(CRET KIN+CKTOT+MB+TROPI)
Relative Index: 2.5 (ref 0.0–2.5)
Relative Index: INVALID (ref 0.0–2.5)
Total CK: 111 U/L (ref 7–232)

## 2010-08-25 LAB — TSH: TSH: 1.643 u[IU]/mL (ref 0.350–4.500)

## 2010-08-25 SURGERY — EGD (ESOPHAGOGASTRODUODENOSCOPY)
Anesthesia: Moderate Sedation

## 2010-08-25 MED ORDER — PANTOPRAZOLE SODIUM 40 MG IV SOLR
40.0000 mg | Freq: Two times a day (BID) | INTRAVENOUS | Status: DC
  Administered 2010-08-25 – 2010-08-26 (×3): 40 mg via INTRAVENOUS
  Filled 2010-08-25 (×3): qty 40

## 2010-08-25 MED ORDER — HYDROMORPHONE HCL 1 MG/ML IJ SOLN
1.0000 mg | INTRAMUSCULAR | Status: DC | PRN
  Administered 2010-08-25 – 2010-08-26 (×3): 1 mg via INTRAVENOUS
  Filled 2010-08-25 (×3): qty 1

## 2010-08-25 MED ORDER — ACETAMINOPHEN 650 MG RE SUPP: 650.0000 mg | Freq: Four times a day (QID) | RECTAL | Status: DC | PRN

## 2010-08-25 MED ORDER — HYDROCODONE-ACETAMINOPHEN 5-325 MG PO TABS
2.0000 | ORAL_TABLET | Freq: Four times a day (QID) | ORAL | Status: DC | PRN
  Filled 2010-08-25: qty 1

## 2010-08-25 MED ORDER — BUTAMBEN-TETRACAINE-BENZOCAINE 2-2-14 % EX AERO
INHALATION_SPRAY | CUTANEOUS | Status: DC | PRN
  Administered 2010-08-25: 2 via TOPICAL

## 2010-08-25 MED ORDER — ONDANSETRON HCL 4 MG/2ML IJ SOLN
4.0000 mg | Freq: Four times a day (QID) | INTRAMUSCULAR | Status: DC | PRN
  Administered 2010-08-25: 4 mg via INTRAVENOUS
  Filled 2010-08-25: qty 2

## 2010-08-25 MED ORDER — MIDAZOLAM HCL 5 MG/5ML IJ SOLN
INTRAMUSCULAR | Status: AC
  Filled 2010-08-25: qty 10

## 2010-08-25 MED ORDER — SODIUM CHLORIDE 0.9 % IJ SOLN
INTRAMUSCULAR | Status: AC
  Administered 2010-08-25: 10 mL
  Filled 2010-08-25: qty 10

## 2010-08-25 MED ORDER — SODIUM CHLORIDE 0.9 % IV SOLN
INTRAVENOUS | Status: DC
  Administered 2010-08-25 (×2): via INTRAVENOUS
  Administered 2010-08-26: 1000 mL via INTRAVENOUS

## 2010-08-25 MED ORDER — PROMETHAZINE HCL 12.5 MG PO TABS: 12.5000 mg | ORAL_TABLET | Freq: Four times a day (QID) | ORAL | Status: DC | PRN

## 2010-08-25 MED ORDER — ONDANSETRON HCL 4 MG PO TABS: 4.0000 mg | ORAL_TABLET | Freq: Four times a day (QID) | ORAL | Status: DC | PRN

## 2010-08-25 MED ORDER — INSULIN ASPART 100 UNIT/ML ~~LOC~~ SOLN
0.0000 [IU] | SUBCUTANEOUS | Status: DC
  Administered 2010-08-25 (×2): 3 [IU] via SUBCUTANEOUS
  Administered 2010-08-25: 5 [IU] via SUBCUTANEOUS
  Administered 2010-08-25: 8 [IU] via SUBCUTANEOUS
  Administered 2010-08-25: 3 [IU] via SUBCUTANEOUS
  Administered 2010-08-26: 5 [IU] via SUBCUTANEOUS
  Administered 2010-08-26: 3 [IU] via SUBCUTANEOUS
  Administered 2010-08-26 (×2): 5 [IU] via SUBCUTANEOUS
  Filled 2010-08-25: qty 3

## 2010-08-25 MED ORDER — ALBUTEROL SULFATE (5 MG/ML) 0.5% IN NEBU: 2.5000 mg | INHALATION_SOLUTION | RESPIRATORY_TRACT | Status: DC | PRN

## 2010-08-25 MED ORDER — MEPERIDINE HCL 50 MG/ML IJ SOLN
INTRAMUSCULAR | Status: AC
  Filled 2010-08-25: qty 1

## 2010-08-25 MED ORDER — ALUM & MAG HYDROXIDE-SIMETH 200-200-20 MG/5ML PO SUSP
30.0000 mL | Freq: Four times a day (QID) | ORAL | Status: DC | PRN
  Administered 2010-08-25 – 2010-08-26 (×2): 30 mL via ORAL
  Filled 2010-08-25 (×2): qty 30

## 2010-08-25 MED ORDER — STERILE WATER FOR IRRIGATION IR SOLN
Status: DC | PRN
  Administered 2010-08-25: 15:00:00

## 2010-08-25 MED ORDER — ACETAMINOPHEN 325 MG PO TABS
650.0000 mg | ORAL_TABLET | Freq: Four times a day (QID) | ORAL | Status: DC | PRN
  Administered 2010-08-25: 650 mg via ORAL
  Filled 2010-08-25: qty 2

## 2010-08-25 MED ORDER — INSULIN GLARGINE 100 UNIT/ML ~~LOC~~ SOLN
10.0000 [IU] | Freq: Every day | SUBCUTANEOUS | Status: DC
  Administered 2010-08-25: 10 [IU] via SUBCUTANEOUS
  Filled 2010-08-25: qty 3

## 2010-08-25 MED ORDER — SUCRALFATE 1 GM/10ML PO SUSP
1.0000 g | Freq: Three times a day (TID) | ORAL | Status: DC
  Administered 2010-08-25 – 2010-08-26 (×4): 1 g via ORAL
  Filled 2010-08-25 (×4): qty 10

## 2010-08-25 MED ORDER — PROMETHAZINE HCL 25 MG/ML IJ SOLN: 12.5000 mg | Freq: Four times a day (QID) | INTRAMUSCULAR | Status: DC | PRN

## 2010-08-25 MED ORDER — MEPERIDINE HCL 25 MG/ML IJ SOLN
INTRAMUSCULAR | Status: DC | PRN
  Administered 2010-08-25 (×2): 25 mg via INTRAVENOUS

## 2010-08-25 MED ORDER — MIDAZOLAM HCL 5 MG/5ML IJ SOLN
INTRAMUSCULAR | Status: DC | PRN
  Administered 2010-08-25 (×3): 2 mg via INTRAVENOUS

## 2010-08-25 MED ORDER — ALPRAZOLAM 0.5 MG PO TABS: 0.5000 mg | ORAL_TABLET | Freq: Three times a day (TID) | ORAL | Status: DC | PRN

## 2010-08-25 NOTE — Progress Notes (Signed)
Subjective: Patient complains of continued pain midsternally. It is described as sharp and intense worse when lying on his back or when he takes a deep breath but most often when he tries to take anything by mouth liquid or solid or pills.  Objective: Weight change:   Intake/Output Summary (Last 24 hours) at 08/25/10 1456 Last data filed at 08/25/10 1246  Gross per 24 hour  Intake    420 ml  Output    225 ml  Net    195 ml   BP 124/77  Pulse 70  Temp(Src) 98.2 F (36.8 C) (Oral)  Resp 16  Ht 5\' 9"  (1.753 m)  Wt 111.721 kg (246 lb 4.8 oz)  BMI 36.37 kg/m2  SpO2 93% General appearance: alert, cooperative, appears stated age, mild distress and mildly obese Lungs: clear to auscultation bilaterally Chest wall: Midsternal tenderness. Heart: regular rate and rhythm, S1, S2 normal, no murmur, click, rub or gallop Abdomen: soft, non-tender; bowel sounds normal; no masses,  no organomegaly Extremities: extremities normal, atraumatic, no cyanosis or edema Pulses: 2+ and symmetric Skin: Skin color, texture, turgor normal. No rashes or lesions  Lab Results: Basic Metabolic Panel:  Basename 08/25/10 0452 08/24/10 2115  NA 140 137  K 3.3* 3.5  CL 104 98  CO2 23 21  GLUCOSE 175* 411*  BUN 11 11  CREATININE 0.69 0.75  CALCIUM 8.7 9.8  MG -- --  PHOS -- --   Liver Function Tests:  [facility] 08/25/10 0452 08/24/10 2115  AST 34 33  ALT 52 59*  ALKPHOS 60 82  BILITOT 0.4 0.5  PROT 6.3 7.3  ALBUMIN 3.1* 3.5    Basename 08/25/10 0452 08/24/10 2115  LIPASE 57 61*  AMYLASE 69 --   CBC:  Basename 08/25/10 0452 08/24/10 2115  WBC 6.7 8.4  NEUTROABS -- --  HGB 13.5 15.1  HCT 40.4 43.7  MCV 90.4 89.2  PLT 129* 147*   Cardiac Enzymes:  Basename 08/25/10 0740 08/24/10 2116  CKTOTAL 111 129  CKMB 2.8 3.1  CKMBINDEX -- --  TROPONINI <0.30 <0.30   CBG:  Basename 08/25/10 1133 08/25/10 0730 08/25/10 0413 08/25/10 0145  GLUCAP 200* 207* 172* 217*     Studies/Results: Ct Angio Chest W/cm &/or Wo Cm  .  IMPRESSION: No evidence of aortic dissection.  No evidence of pulmonary embolism.    CTA ABDOMEN AND PELVIS  Findings:  IMPRESSION: No evidence of aortic dissection.  Hepatic steatosis.  Cholelithiasis, without associated inflammatory changes.  Original Report Authenticated By: Charline Bills, M.D.   Ct Angio Abd/pel W/cm &/or W/o Cm  Medications: Scheduled Meds:   . aspirin EC  325 mg Oral Once  . gi cocktail  30 mL Oral Once  . HYDROmorphone  1 mg Intravenous Once  . insulin aspart  0-15 Units Subcutaneous Q4H  . insulin glargine  10 Units Subcutaneous QHS  . insulin regular  10 Units Subcutaneous Once  . meperidine      . midazolam      . morphine  4 mg Intravenous Once  . ondansetron  4 mg Intravenous Once  . pantoprazole  40 mg Intravenous Once  . pantoprazole (PROTONIX) IV  40 mg Intravenous Q12H   Continuous Infusions:   . sodium chloride 100 mL/hr at 08/25/10 0638   PRN Meds:.acetaminophen, acetaminophen, albuterol, ALPRAZolam, alum & mag hydroxide-simeth, HYDROcodone-acetaminophen, HYDROmorphone, iohexol, ondansetron (ZOFRAN) IV, ondansetron, promethazine, promethazine  Assessment/Plan: Patient Active Hospital Problem List: Chest pain (08/25/2010)   Assessment: Noncardiac. Enzymes  x3 negative and history not consistent with cardiac in nature. Suspect this is GI, most likely esophageal ulcer versus esophageal candidiasis versus narrowing. Perhaps even gastric ulcer. Appreciate GIs plan to see an EGD this afternoon.   DM type 2 (diabetes mellitus, type 2) (08/25/2010)  sliding scale   Obesity    LOS: 1 day   [name] K 08/25/2010, 2:56 PM

## 2010-08-25 NOTE — H&P (Signed)
[name] is an 50 y.o. male.  This is a primary care physician is Dr. Sherwood Gambler.  Chief Complaint: Chest pain, and abdominal pain  HPI: This is a 50 year old male of Hispanic origin who was in his usual state of health yesterday when he started having lower chest and upper abdominal pain. The pain was described as sharp, sometimes burning pain, radiating to the back. There was no nausea, vomiting in the last 2 days, but he has had nausea and vomiting in the past. Denies any shortness of breath or fever. Has been having a dry cough for the last 6 months. Denies any weight loss. Denies any blood in the stools or black stools. Denies any palpitations, or diaphoresis. The pain is present even at rest. He also admits to difficulty swallowing and regurgitation of food many times. The pain as such, currently, 9/10 in intensity. He says he used to drink heavily many years ago but quit drinking about 20 years ago. He was diagnosed with ulcer in the stomach at that time. He continues to have regurgitation. He admits to having had a cardiac catheterization about 3 years ago at [facility] and if he was told that it was clear.   Prior to Admission medications   Medication Sig Start Date End Date Taking? Authorizing Provider  ALPRAZolam Prudy Feeler) 0.5 MG tablet Take 0.5 mg by mouth 4 (four) times daily as needed. FOR  MOOD STABILIZER    Yes Historical Provider, MD  aspirin 325 MG tablet Take 325 mg by mouth every other day.     Yes Historical Provider, MD  HYDROcodone-acetaminophen (LORCET) 10-650 MG per tablet Take 1 tablet by mouth 4 (four) times daily as needed. FOR KNEE PAIN    Yes Historical Provider, MD  Insulin Glargine (LANTUS SOLOSTAR [place]) Inject 25-35 Units into the skin at bedtime. IF  SUGAR LEVEL IS ABOVE 250 USE 35 UNITS    Yes Historical Provider, MD  ketotifen (REFRESH EYE ITCH RELIEF) 0.025 % ophthalmic solution 1 drop 2 (two) times daily. OTC FOR ALLERGIES OR DRY EYES    Yes Historical Provider,  MD  metFORMIN (GLUCOPHAGE) 500 MG tablet Take 500 mg by mouth 2 (two) times daily with a meal. PATIENT IS UNSURE OF DOSE, WILL FOLLOW UP WITH PHARMACY WHEN THEY OPEN TO VERIFY    Yes Historical Provider, MD  PRESCRIPTION MEDICATION daily. BLOOD PRESSURE MEDICATION, PATIENT UNSURE OF THE NAME, (PHARMACY TO FOLLOW UP)    Yes Historical Provider, MD    Allergies: No Known Allergies  Past Medical History  Diagnosis Date  . Diabetes mellitus   . Arthritis   . Kidney stones     History reviewed. No pertinent past surgical history.  Social History:  reports that he has quit smoking. He does not have any smokeless tobacco history on file. He reports that he does not drink alcohol or use illicit drugs.  Family History:  Family History  Problem Relation Age of Onset  . Diabetes Mother   . Heart failure Mother   . Heart disease Mother   . Heart failure Father     Review of Systems  Constitutional: Negative for fever and chills.  Eyes: Negative.   Respiratory: Positive for cough. Negative for sputum production, shortness of breath and wheezing.   Cardiovascular: Positive for chest pain. Negative for palpitations and orthopnea.  Gastrointestinal: Positive for heartburn, nausea and abdominal pain. Negative for blood in stool and melena.  Genitourinary: Positive for flank pain.  Musculoskeletal: Positive  for joint pain.  Skin: Negative.   Neurological: Positive for headaches. Negative for dizziness and tingling.  Endo/Heme/Allergies: Negative.   Psychiatric/Behavioral: Negative.     Blood pressure 148/97, pulse 87, temperature 97.8 F (36.6 C), temperature source Oral, resp. rate 19, height 5\' 9"  (1.753 m), weight 114.76 kg (253 lb), SpO2 95.00%. Physical Exam  Vitals reviewed. Constitutional: He is oriented to person, place, and time. He appears well-developed and well-nourished. No distress.  HENT:  Head: Normocephalic and atraumatic.  Nose: Nose normal.  Mouth/Throat: Oropharynx  is clear and moist. No oropharyngeal exudate.  Eyes: Conjunctivae and EOM are normal. Pupils are equal, round, and reactive to light. Right eye exhibits no discharge. Left eye exhibits no discharge. No scleral icterus.  Neck: Normal range of motion. Neck supple. No JVD present. No tracheal deviation present. No thyromegaly present.  Cardiovascular: Normal rate, regular rhythm, normal heart sounds and intact distal pulses.  Exam reveals no gallop and no friction rub.   No murmur heard. Pulmonary/Chest: Effort normal and breath sounds normal. No stridor. No respiratory distress. He has no wheezes. He has no rales. He exhibits no tenderness.  Abdominal: Soft. Normal appearance and bowel sounds are normal. He exhibits no distension, no pulsatile liver, no abdominal bruit, no ascites and no mass. There is no hepatosplenomegaly. There is tenderness in the epigastric area. There is no rebound, no guarding, no tenderness at McBurney's point and negative Murphy's sign. No hernia.  Musculoskeletal: Normal range of motion.  Lymphadenopathy:    He has no cervical adenopathy.  Neurological: He is alert and oriented to person, place, and time. No cranial nerve deficit.  Skin: Skin is warm and dry. No rash noted. He is not diaphoretic. No erythema.     Results for orders placed during the hospital encounter of 08/24/10 (from the past 48 hour(s))  CBC     Status: Abnormal   Collection Time   08/24/10  9:15 PM      Component Value Range Comment   WBC 8.4  4.0 - 10.5 (K/uL)    RBC 4.90  4.22 - 5.81 (MIL/uL)    Hemoglobin 15.1  13.0 - 17.0 (g/dL)    HCT 16.1  09.6 - 04.5 (%)    MCV 89.2  78.0 - 100.0 (fL)    MCH 30.8  26.0 - 34.0 (pg)    MCHC 34.6  30.0 - 36.0 (g/dL)    RDW 40.9  81.1 - 91.4 (%)    Platelets 147 (*) 150 - 400 (K/uL)   BASIC METABOLIC PANEL     Status: Abnormal   Collection Time   08/24/10  9:15 PM      Component Value Range Comment   Sodium 137  135 - 145 (mEq/L)    Potassium 3.5  3.5 -  5.1 (mEq/L)    Chloride 98  96 - 112 (mEq/L)    CO2 21  19 - 32 (mEq/L)    Glucose, Bld 411 (*) 70 - 99 (mg/dL)    BUN 11  6 - 23 (mg/dL)    Creatinine, Ser 7.82  0.50 - 1.35 (mg/dL)    Calcium 9.8  8.4 - 10.5 (mg/dL)    GFR calc non Af Amer >60  >60 (mL/min)    GFR calc Af Amer >60  >60 (mL/min)   TROPONIN I     Status: Normal   Collection Time   08/24/10  9:16 PM      Component Value Range Comment   Troponin I <  0.30  <0.30 (ng/mL)   CK TOTAL AND CKMB     Status: Normal   Collection Time   08/24/10  9:16 PM      Component Value Range Comment   Total CK 129  7 - 232 (U/L)    CK, MB 3.1  0.3 - 4.0 (ng/mL)    Relative Index 2.4  0.0 - 2.5    URINALYSIS, ROUTINE W REFLEX MICROSCOPIC     Status: Abnormal   Collection Time   08/24/10  9:24 PM      Component Value Range Comment   Color, Urine YELLOW  YELLOW     Appearance CLEAR  CLEAR     Specific Gravity, Urine 1.015  1.005 - 1.030     pH 6.5  5.0 - 8.0     Glucose, UA >1000 (*) NEGATIVE (mg/dL)    Hgb urine dipstick NEGATIVE  NEGATIVE     Bilirubin Urine NEGATIVE  NEGATIVE     Ketones, ur NEGATIVE  NEGATIVE (mg/dL)    Protein, ur NEGATIVE  NEGATIVE (mg/dL)    Urobilinogen, UA 0.2  0.0 - 1.0 (mg/dL)    Nitrite NEGATIVE  NEGATIVE     Leukocytes, UA NEGATIVE  NEGATIVE    URINE MICROSCOPIC-ADD ON     Status: Abnormal   Collection Time   08/24/10  9:24 PM      Component Value Range Comment   WBC, UA 3-6  <3 (WBC/hpf)    Bacteria, UA FEW (*) RARE     Ct Angio Chest W/cm &/or Wo Cm  08/24/2010  *RADIOLOGY REPORT*  Clinical Data:  Chest pain, radiating to back, evaluate for dissection  CT ANGIOGRAPHY CHEST, ABDOMEN AND PELVIS  Technique:  Multidetector CT imaging through the chest, abdomen and pelvis was performed using the standard protocol during bolus administration of intravenous contrast.  Multiplanar reconstructed images including MIPs were obtained and reviewed to evaluate the vascular anatomy.  Contrast: 100 ml Omnipaque-300 IV   Comparison:  Chest radiograph dated 11/10/2006  CTA CHEST  Findings:  No evidence of aortic dissection.  No evidence of pulmonary embolism.  The lungs are essentially clear.  No suspicious pulmonary nodules. No pleural effusion or pneumothorax.  Visualized thyroid is unremarkable.  The heart is normal in size.  No pericardial effusion.  Coronary atherosclerosis.  No mediastinal, hilar, or axillary lymphadenopathy.  Degenerative changes of the thoracic spine.   Review of the MIP images confirms the above findings.  IMPRESSION: No evidence of aortic dissection.  No evidence of pulmonary embolism.  CTA ABDOMEN AND PELVIS  Findings:  No evidence of aortic dissection.  Classic hepatic arterial anatomy.  The celiac axis, SMA, and IMA are patent.  Single bilateral renal arteries, patent.  Bilateral common iliac arteries are patent.  Hepatic steatosis.  Liver, pancreas, adrenal glands are within normal limits.  Cholelithiasis, without associated inflammatory changes.  No intrahepatic or extrahepatic ductal dilatation.  Kidneys within normal limits.  No hydronephrosis.  No evidence of bowel obstruction.  Normal appendix.  Colonic diverticulosis, without associated inflammatory changes.  No abdominopelvic ascites.  No suspicious abdominopelvic lymphadenopathy.  Prostate is unremarkable.  Bladder is within normal limits.  Degenerative changes of the lumbar spine.  Bilateral pars defects at L5.   Review of the MIP images confirms the above findings.  IMPRESSION: No evidence of aortic dissection.  Hepatic steatosis.  Cholelithiasis, without associated inflammatory changes.  Original Report Authenticated By: Charline Bills, M.D.   Ct Angio Abd/pel W/cm &/or W/o Cm  08/24/2010  *RADIOLOGY REPORT*  Clinical Data:  Chest pain, radiating to back, evaluate for dissection  CT ANGIOGRAPHY CHEST, ABDOMEN AND PELVIS  Technique:  Multidetector CT imaging through the chest, abdomen and pelvis was performed using the standard protocol  during bolus administration of intravenous contrast.  Multiplanar reconstructed images including MIPs were obtained and reviewed to evaluate the vascular anatomy.  Contrast: 100 ml Omnipaque-300 IV  Comparison:  Chest radiograph dated 11/10/2006  CTA CHEST  Findings:  No evidence of aortic dissection.  No evidence of pulmonary embolism.  The lungs are essentially clear.  No suspicious pulmonary nodules. No pleural effusion or pneumothorax.  Visualized thyroid is unremarkable.  The heart is normal in size.  No pericardial effusion.  Coronary atherosclerosis.  No mediastinal, hilar, or axillary lymphadenopathy.  Degenerative changes of the thoracic spine.   Review of the MIP images confirms the above findings.  IMPRESSION: No evidence of aortic dissection.  No evidence of pulmonary embolism.  CTA ABDOMEN AND PELVIS  Findings:  No evidence of aortic dissection.  Classic hepatic arterial anatomy.  The celiac axis, SMA, and IMA are patent.  Single bilateral renal arteries, patent.  Bilateral common iliac arteries are patent.  Hepatic steatosis.  Liver, pancreas, adrenal glands are within normal limits.  Cholelithiasis, without associated inflammatory changes.  No intrahepatic or extrahepatic ductal dilatation.  Kidneys within normal limits.  No hydronephrosis.  No evidence of bowel obstruction.  Normal appendix.  Colonic diverticulosis, without associated inflammatory changes.  No abdominopelvic ascites.  No suspicious abdominopelvic lymphadenopathy.  Prostate is unremarkable.  Bladder is within normal limits.  Degenerative changes of the lumbar spine.  Bilateral pars defects at L5.   Review of the MIP images confirms the above findings.  IMPRESSION: No evidence of aortic dissection.  Hepatic steatosis.  Cholelithiasis, without associated inflammatory changes.  Original Report Authenticated By: Charline Bills, M.D.   EKG: Sinus rhythm at 95 beats per minute. Normal axis. Intervals appear to be in the normal range.  Possible Q wave in lead 3. T. inversion in lead 3. Poor R wave progression. No other concerning ST changes are noted.  Assessment/Plan  Principal Problem:  *Chest pain Active Problems:  Abdominal pain, acute, epigastric  DM type 2 (diabetes mellitus, type 2)  Chronic pain   #1 chest pain: I think his pain is GI in origin. Because the pain radiates to the back we need to rule out acute pancreatitis. CT did not show any evidence for dissection. But cholelithiasis was seen along with hepatic steatosis. With a negative cardiac catheter 3 years ago it is unlikely the patient has significant coronary disease.  #2 abdominal pain. Patient does have significant abdominal pain. It's radiating to his back. I feel the patient may have pancreatitis. We will check a lipase level. Will check LFTs. Will put him on protonix. Because of his significant dysphagia and odynophagia will go ahead and consult GI. He may require endoscopy at some point.  #3 diabetes. We'll put him on a low-dose of Lantus, as he will be kept n.p.o. HbA1c will be checked. He likely has very poorly controlled diabetes.  #4 chronic arthritic pain. This is mostly located in his lower extremities. He may have significant neuropathy. Continue with his pain medications for now.  #5 elevated blood pressure: This is likely a result of his pain. We'll monitor his blood pressures closely.  #6 DVT, prophylaxis will be provided.  Further management decisions will depend on results of further testing and patient's response  to treatment.  [name] 08/25/2010, 12:55 AM

## 2010-08-25 NOTE — Op Note (Signed)
EGD PROCEDURE REPORT  PATIENT:  [name]  MR#:  [id number] Birthdate:  13-Aug-1960, 50 y.o., male Endoscopist:  Dr. Malissa Hippo, MD Primary MD.   Dr. Sherwood Gambler. Procedure Date: 08/25/2010  Procedure:   EGD  Indications:  Chest pain with dysphagia and odynophagia of acute onset.            Informed Consent:  The risks, benefits, alternatives & imponderables which include, but are not limited to, bleeding, infection, perforation, drug reaction and potential missed lesion have been reviewed.  The potential for biopsy, lesion removal, esophageal dilation, etc. have also been discussed.  Questions have been answered.  All parties agreeable.  Please see history & physical in medical record for more information.  Medications:  Demerol 59mg  IV Versed 6 mg IV Cetacaine spray topically for oropharyngeal anesthesia  Description of procedure:  The endoscope was introduced through the mouth and advanced to the second portion of the duodenum without difficulty or limitations. The mucosal surfaces were surveyed very carefully during advancement of the scope and upon withdrawal.  Findings:  Esophagus:  About 3 x 4 cm patch of mucosa at 35 cm from the incisors with erythema, friability and exudate; distal esophageal mucosa normal. GEJ:  41 cm  Stomach:  2 antral erosions otherwise normal examination Duodenum:  Normal bulbar and post bulbar mucosa  Therapeutic/Diagnostic Maneuvers Performed:  Biopsy taken from this abnormal patch of his official mucosa  Complications:  None  Impression: Focal acute esophagitis; ? pill-induced. Erosive antral gastritis.  Recommendations:  Continue IV PPI for now. Airfreight liquid 1 g 4 times a day. Full liquids today. Await biopsy results. H.Pylori serology.  Lakaya Tolen U  08/25/2010  3:49 PM  CC: Dr. No primary provider on file. & Dr. No ref. provider found

## 2010-08-25 NOTE — Consult Note (Signed)
Job no; [id number]

## 2010-08-25 NOTE — Consult Note (Signed)
[name], [name]            ACCOUNT NO.:  [id number]  MEDICAL RECORD NO.:  [id number]  LOCATION:  A302                          FACILITY:  [facility]  PHYSICIAN:  [name], M.D.    DATE OF BIRTH:  [birth year]  DATE OF CONSULTATION:  08/25/2010 DATE OF DISCHARGE:                                CONSULTATION   REASON FOR CONSULTATION:  Chest pain and odynophagia.  HISTORY OF PRESENT ILLNESS:  The patient is a 50 year old male who is admitted to hospitalist service via emergency room, where he presented late last night and was admitted just after midnight by Dr. Osvaldo Reilly.  He states he was at usual state of health until 3 days ago or 2 days prior to admission when he began to experience sharp retrosternal pain radiating posteriorly.  He also noted difficulty when he tried to swallow solids and even liquids.  However, he has not had any difficulty with saliva.  His pain got worse and he could not swallow his food.  He, therefore, came to emergency room.  He was evaluated.  He was noted to have mildly elevated ALT of 59.  His AST was 33.  He also had CT angio of the chest which was negative for aortic dissection or pulmonary embolism.  No adenopathy or thyromegaly was noted.  Abdominopelvic CT revealed echogenic liver calcified gallstones, but no evidence of wall thickening or pericholecystic fluid or dilated bile duct.  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  He also had cardiac enzymes and these were within normal limits.  The patient was hospitalized and begun on IV fluids and IV PPI.  The patient states that he has been having heartburn off and on for the last 40 years.  He was on a medication until 1 year ago.  He states the medicine was discontinued when his NSAID was stopped as his physician did not feel that he needed to be on PPI.  He says he has been having heartburn daily.  However, he has not experienced any dysphagia  or odynophagia until now.  He was treated with antibiotic for "chest cold" and the last dose was 1 week ago.  He does not remember the name of this medication.  He denies fever, chills or shortness of breath.  He states when he takes deep breath it does result in some pain.  He has not experienced any hematemesis or melena.  He had a normal stool this morning.  He is trying to lose weight.  He states he used to weigh close to 290 pounds and he is now down to around 250.  He denies abdominal pain.  At home, he has been on: 1. ASA 325 mg every other day. 2. Vicodin 10/650 one q.6 p.r.n. 3. Lantus 25-35 units every evening. 4. Alprazolam 0.5 mg q.6 p.r.n. which he does not take daily. 5. Ketotifen eyedrops 1 drop b.i.d. p.r.n. for dry eyes with     allergies. 6. Metformin 500 mg twice daily. 7. Lisinopril 20 mg p.o. daily.  CURRENT MEDICATIONS: 1. Tylenol 650 mg p.o. or PR q.6 p.r.n. 2. Alprazolam 0.5 mg t.i.d. p.r.n. 3. Dilaudid 1 mg IV q.3  p.r.n. 4. Albuterol 0.5% neb 2.5 mg every 2 h. p.r.n. 5. Zofran 4 mg IV or oral q.6 p.r.n. 6. Promethazine 12.5 mg p.o. q.6 p.r.n. 7. He is also on IV fluids. 8. Pantoprazole 40 mg IV q.12 h.  PAST MEDICAL HISTORY:  Obesity.  His peak weight was around 290 pounds and now he is trying to lose weight.  He has been hypertensive for 8 years.  He has been diabetic for 2 years and his control has been rather poor. He does not remember his last hemoglobin A1c.  He has left knee arthritis.  However, he remains physically active.  He had a cardiac cath about 3 years ago and was normal.  At that time, he was having pain in his left shoulder and left arm.  Stress disorder.  History of renal stones removed via cystoscopy in 1996, possibly calcium oxalate stones.  ALLERGIES:  NK.  FAMILY HISTORY:  Mother was diabetic and died of MI at age 57.  Father had MI, died at age 43.  He has 1 brother and 4 sisters, one sister is diabetic.  They  generally are in good health.  SOCIAL HISTORY:  He is married (second marriage).  He has 4 children from his first marriage and they are all healthy.  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  He is presently unemployed.  He used to drink alcohol at times too much, but he quit drinking 20 years ago.  He smoked 2-3 packs per day for 15 years, but finally quit 20 years ago.  He has smoked marijuana in the remote past, but not recently and no history of IV drug use.  OBJECTIVE:  VITAL SIGNS:  He weighs 114.76 kg which is 253 pounds, he is 69 inches tall, pulse 70 per minute, blood pressure 124/77, respiratory rate is 16 and temperature is 98.2. GENERAL:  He has remained afebrile during this admission. EYES:  Conjunctivae are pink.  Sclerae are nonicteric. MOUTH:  Oropharyngeal mucosa is normal.  Few teeth are missing a lower jaw, rest in satisfactory condition. NECK:  No neck masses or thyromegaly noted. CARDIAC:  With regular rhythm.  Normal S1 and S2.  No murmur or gallop noted. LUNGS:  Clear to auscultation. ABDOMEN:  Full.  Bowel sounds are normal.  On palpation, soft abdomen without tenderness, organomegaly or masses. EXTREMITIES:  No peripheral edema or clubbing noted.  LABORATORY DATA FROM ADMISSION:  WBC 8.4, H and H 15.1 and 43.7, platelet count 147 K, bilirubin 0.5, AP 82, AST 33, ALT 59, total protein 7.3 with albumin of 3.5, glucose was 411, BUN 10, creatinine 0.75.  Sodium 137, potassium 3.5, chloride 98, CO2 21 and his calcium was 9.8.  Lipase was 61 which is mildly elevated.  Troponin level was less than 0.30.  LABORATORY DATA FROM THIS MORNING:  Bilirubin 0.4, AST 34, ALT is 52, glucose 175, potassium 3.1, amylase is 69, lipase 57 and TSH is pending.  Chest and abdominopelvic CT reviewed.  There does not appear to be any thickening to esophageal wall.  He does have multiple calcified stones in the gallbladder.  There is no wall  thickening or pericholecystic fluid or dilated bile duct.  Liver is echogenic.  Spleen is not enlarged.  ASSESSMENT: 1. The patient is a 50 year old male, who presents with acute onset of     chest pain with odynophagia.  He recently did take antibiotics for     "chest cold."  He is diabetic and his diabetes has been poorly     controlled.  He has chronic chronic heartburn. I suspect his symptoms are either due to severe reflux esophagitis or Candida esophagitis or both. 1. Mildly elevated ALT; on followup has decreased.  I suspect this is     due to fatty liver and not indicative of biliary colic. 2. Cholelithiasis.  This would appear to be asymptomatic at the     present time.  RECOMMENDATIONS:  Diagnostic esophagogastroduodenoscopy to be performed later today.  If he does have a stricture it may have to be dilated.  I have reviewed the procedure risks with the patient and he is agreeable.  We appreciate the opportunity to participate in the care of this gentleman.  Thank you very much.          ______________________________ [name], M.D.     NR/MEDQ  D:  08/25/2010  T:  08/25/2010  Job:  [id number]  cc:   Madelin Rear. Sherwood Gambler, MD Fax: [phone]

## 2010-08-26 DIAGNOSIS — K2210 Ulcer of esophagus without bleeding: Secondary | ICD-10-CM | POA: Diagnosis present

## 2010-08-26 DIAGNOSIS — K208 Other esophagitis without bleeding: Secondary | ICD-10-CM

## 2010-08-26 DIAGNOSIS — I10 Essential (primary) hypertension: Secondary | ICD-10-CM | POA: Diagnosis present

## 2010-08-26 LAB — HEPATITIS PANEL, ACUTE
Hep B C IgM: NEGATIVE
Hepatitis B Surface Ag: NEGATIVE

## 2010-08-26 LAB — GLUCOSE, CAPILLARY
Glucose-Capillary: 232 mg/dL — ABNORMAL HIGH (ref 70–99)
Glucose-Capillary: 240 mg/dL — ABNORMAL HIGH (ref 70–99)

## 2010-08-26 LAB — CARDIAC PANEL(CRET KIN+CKTOT+MB+TROPI): Total CK: 85 U/L (ref 7–232)

## 2010-08-26 MED ORDER — SUCRALFATE 1 GM/10ML PO SUSP: 1.0000 g | Freq: Three times a day (TID) | ORAL | Status: AC

## 2010-08-26 MED ORDER — ASPIRIN 325 MG PO TABS: 325.0000 mg | ORAL_TABLET | ORAL | Status: DC

## 2010-08-26 MED ORDER — OMEPRAZOLE MAGNESIUM 20 MG PO TBEC: 20.0000 mg | DELAYED_RELEASE_TABLET | Freq: Every day | ORAL | Status: DC

## 2010-08-26 MED ORDER — SODIUM CHLORIDE 0.9 % IJ SOLN
INTRAMUSCULAR | Status: AC
  Administered 2010-08-26: 10 mL
  Filled 2010-08-26: qty 10

## 2010-08-26 MED ORDER — PROMETHAZINE HCL 12.5 MG PO TABS: 12.5000 mg | ORAL_TABLET | Freq: Three times a day (TID) | ORAL | Status: AC | PRN

## 2010-08-26 MED ORDER — SUCRALFATE 1 GM/10ML PO SUSP: 1.0000 g | Freq: Three times a day (TID) | ORAL | Status: DC

## 2010-08-26 MED ORDER — OMEPRAZOLE MAGNESIUM 20 MG PO TBEC: 20.0000 mg | DELAYED_RELEASE_TABLET | Freq: Two times a day (BID) | ORAL | Status: DC

## 2010-08-26 NOTE — Discharge Summary (Signed)
Discharge Summary  [name]  MR#: [id number]  DOB:05-22-1960  Date of Admission: 08/24/2010 Date of Discharge: 08/26/2010  Attending Physician:Camerin Ladouceur K  Patient's PCP: No primary provider on file.  Consults: Treatment Team:  Malissa Hippo, MD, GI  Discharge Diagnoses: Esophageal ulcer without bleeding .DM type 2 (diabetes mellitus, type 2) .Chronic pain .HTN (hypertension)    Discharge Medications Current Discharge Medication List    START taking these medications   Details  omeprazole (PRILOSEC OTC) 20 MG tablet Take 1 tablet (20 mg total) by mouth 2 (two) times daily. Qty: 60 tablet, Refills: 1    promethazine (PHENERGAN) 12.5 MG tablet Take 1 tablet (12.5 mg total) by mouth every 8 (eight) hours as needed for nausea. Qty: 15 tablet, Refills: 0    sucralfate (CARAFATE) 1 GM/10ML suspension Take 10 mLs (1 g total) by mouth 4 (four) times daily -  with meals and at bedtime. Qty: 420 mL, Refills: 1      CONTINUE these medications which have CHANGED   Details  aspirin 325 MG tablet Stop this medicine for next 3-4 weeks until ulcer has healed.  Take 1 tablet (325 mg total) by mouth every other day.      CONTINUE these medications which have NOT CHANGED   Details  ALPRAZolam (XANAX) 0.5 MG tablet Take 0.5 mg by mouth 4 (four) times daily as needed. FOR  MOOD STABILIZER     HYDROcodone-acetaminophen (LORCET) 10-650 MG per tablet Take 1 tablet by mouth 4 (four) times daily as needed. FOR KNEE PAIN     Insulin Glargine (LANTUS SOLOSTAR [place]) Inject 25-35 Units into the skin at bedtime. IF  SUGAR LEVEL IS ABOVE 250 USE 35 UNITS     ketotifen (REFRESH EYE ITCH RELIEF) 0.025 % ophthalmic solution 1 drop 2 (two) times daily. OTC FOR ALLERGIES OR DRY EYES     lisinopril (PRINIVIL,ZESTRIL) 20 MG tablet Take 20 mg by mouth daily.      metFORMIN (GLUCOPHAGE) 500 MG tablet Take 500 mg by mouth 2 (two) times daily with a meal.        STOP taking these  medications     PRESCRIPTION MEDICATION         Hospital Course: Esophageal ulcer without bleeding: Pt is a 50 yr old male w/hx of HTN who presented with complaints of mid epigastric pain radiating to chest.  This pain was described as worse when lying down or with dep inspiration, but most noticeably when eating or drinking.  Given his hx of HTN, patient was admitted to ensure this was not cardiac in nature. GI was consulted given the suspicion this was likely a GI process.    Enzymes x 3 were negative.  Patient was taken for an endoscopy which noted gastritis & an esophageal ulcer.  This was felt to be the cause of the patient's pain.  He will be discharged on BID PPI and carafate.  Present on Admission:  .DM type 2 (diabetes mellitus, type 2)-stable, will advise patient to monitor CBG's as his PO intake make decrease while he has the ulcer.  .Chronic pain  .HTN (hypertension): Stable  Day of Discharge BP 158/84  Pulse 76  Temp(Src) 97.7 F (36.5 C) (Oral)  Resp 20  Ht 5\' 9"  (1.753 m)  Wt 111.721 kg (246 lb 4.8 oz)  BMI 36.37 kg/m2  SpO2 96%  Physical Exam: Gen: Alert and oriented.  NAD. CV:RRR S1S2 Lungs: CTA Bilat Abd: Soft, mild tenderness esp in mid-epigastric area,  mild obese, +BS Ext: Tr edema.  Disposition: Improved.   Follow-up Appts: Discharge Orders    Future Orders Please Complete By Expires   Diet - low sodium heart healthy      Increase activity slowly         TESTS THAT NEED FOLLOW-UP Biopsies during EGD  Signed: Hollice Espy 08/26/2010, 11:03 AM

## 2010-08-26 NOTE — Progress Notes (Signed)
Subjective. Patient feels better; he is having much less pain with liquids. He also has not experienced any regurgitation nausea or vomiting.; He denies abdominal pain or melena. Objective; BP 158/84  Pulse 76  Temp(Src) 97.7 F (36.5 C) (Oral)  Resp 20  Ht 5\' 9"  (1.753 m)  Wt 111.721 kg (246 lb 4.8 oz)  BMI 36.37 kg/m2  SpO2 96% Patient is ambulating in the hall; His abdominal exam reveals soft abdomen without tenderness or organomegaly. Lab data; Esophageal biopsy is pending; Assessment; Acute esophagitis, possible pill-induced; patient doing better with PPI and Carafate. Mildly elevated ALT possibly secondary to fatty liver. Asymptomatic cholelithiasis. Recommendations; Advance diet. Continue Carafate for 2 weeks. Continue PPI twice a day schedule for 8 weeks and then once a day. I will contact patient with results of biopsy later this week.

## 2010-08-27 LAB — H. PYLORI ANTIBODY, IGG: H Pylori IgG: <0.4 {ISR}

## 2010-08-29 ENCOUNTER — Encounter (INDEPENDENT_AMBULATORY_CARE_PROVIDER_SITE_OTHER): Payer: Self-pay | Admitting: *Deleted

## 2010-08-30 NOTE — Progress Notes (Signed)
Encounter addended by: Ree Shay, RN on: 08/30/2010  4:50 PM<BR>     Documentation filed: Charges VN

## 2010-09-03 ENCOUNTER — Encounter (HOSPITAL_COMMUNITY): Payer: Self-pay | Admitting: Internal Medicine

## 2010-09-19 NOTE — Progress Notes (Signed)
Encounter addended by: Clarene Critchley on: 09/19/2010  8:37 AM<BR>     Documentation filed: Flowsheet VN

## 2010-10-30 LAB — DIFFERENTIAL
Basophils Absolute: 0
Basophils Absolute: 0
Basophils Relative: 0
Basophils Relative: 1
Lymphocytes Relative: 35
Lymphocytes Relative: 41
Monocytes Absolute: 0.4
Neutro Abs: 3
Neutro Abs: 4.5
Neutrophils Relative %: 57

## 2010-10-30 LAB — D-DIMER, QUANTITATIVE: D-Dimer, Quant: <0.22

## 2010-10-30 LAB — POCT CARDIAC MARKERS
CKMB, poc: 1.3
CKMB, poc: 2.5
Operator id: 215651
Operator id: 215651
Operator id: 221061
Troponin i, poc: <0.05
Troponin i, poc: <0.05

## 2010-10-30 LAB — COMPREHENSIVE METABOLIC PANEL
Albumin: 3.6
Alkaline Phosphatase: 51
Alkaline Phosphatase: 55
BUN: 13
BUN: 13
CO2: 25
CO2: 28
Chloride: 105
Chloride: 107
Creatinine, Ser: 0.92
Creatinine, Ser: 0.93
GFR calc non Af Amer: >60
GFR calc non Af Amer: >60
Glucose, Bld: 100 — ABNORMAL HIGH
Glucose, Bld: 120 — ABNORMAL HIGH
Potassium: 3.6
Total Bilirubin: 0.5
Total Bilirubin: 0.8

## 2010-10-30 LAB — APTT
aPTT: 29
aPTT: 49 — ABNORMAL HIGH

## 2010-10-30 LAB — CK TOTAL AND CKMB (NOT AT ARMC)
Relative Index: 1.7
Relative Index: 2
Relative Index: 2
Total CK: 132
Total CK: 133

## 2010-10-30 LAB — CBC
HCT: 43.6
HCT: 44.2
Hemoglobin: 15.1
MCHC: 34.1
MCHC: 34.3
MCV: 90.8
Platelets: 178
Platelets: 194
RDW: 13.1
RDW: 13.5

## 2010-10-30 LAB — TROPONIN I
Troponin I: 0.01
Troponin I: 0.01
Troponin I: 0.02
Troponin I: <0.01

## 2010-10-30 LAB — PROTIME-INR
INR: 1
Prothrombin Time: 13.4
Prothrombin Time: 13.5

## 2012-04-16 ENCOUNTER — Encounter (INDEPENDENT_AMBULATORY_CARE_PROVIDER_SITE_OTHER): Payer: Self-pay | Admitting: *Deleted

## 2012-04-26 ENCOUNTER — Emergency Department (HOSPITAL_COMMUNITY)
Admission: EM | Admit: 2012-04-26 | Discharge: 2012-04-26 | Disposition: A | Discharge status: Home / Self Care | Payer: Managed Care, Other (non HMO) | Attending: Emergency Medicine | Admitting: Emergency Medicine

## 2012-04-26 ENCOUNTER — Encounter (HOSPITAL_COMMUNITY): Payer: Self-pay | Admitting: *Deleted

## 2012-04-26 ENCOUNTER — Emergency Department (HOSPITAL_COMMUNITY): Payer: Managed Care, Other (non HMO)

## 2012-04-26 DIAGNOSIS — Y9389 Activity, other specified: Secondary | ICD-10-CM | POA: Insufficient documentation

## 2012-04-26 DIAGNOSIS — Z7982 Long term (current) use of aspirin: Secondary | ICD-10-CM | POA: Insufficient documentation

## 2012-04-26 DIAGNOSIS — S335XXA Sprain of ligaments of lumbar spine, initial encounter: Secondary | ICD-10-CM | POA: Insufficient documentation

## 2012-04-26 DIAGNOSIS — Z87442 Personal history of urinary calculi: Secondary | ICD-10-CM | POA: Insufficient documentation

## 2012-04-26 DIAGNOSIS — E119 Type 2 diabetes mellitus without complications: Secondary | ICD-10-CM | POA: Insufficient documentation

## 2012-04-26 DIAGNOSIS — Z794 Long term (current) use of insulin: Secondary | ICD-10-CM | POA: Insufficient documentation

## 2012-04-26 DIAGNOSIS — Y92410 Unspecified street and highway as the place of occurrence of the external cause: Secondary | ICD-10-CM | POA: Insufficient documentation

## 2012-04-26 DIAGNOSIS — I10 Essential (primary) hypertension: Secondary | ICD-10-CM | POA: Insufficient documentation

## 2012-04-26 DIAGNOSIS — Z8739 Personal history of other diseases of the musculoskeletal system and connective tissue: Secondary | ICD-10-CM | POA: Insufficient documentation

## 2012-04-26 DIAGNOSIS — Z79899 Other long term (current) drug therapy: Secondary | ICD-10-CM | POA: Insufficient documentation

## 2012-04-26 DIAGNOSIS — S39012A Strain of muscle, fascia and tendon of lower back, initial encounter: Secondary | ICD-10-CM

## 2012-04-26 DIAGNOSIS — F17200 Nicotine dependence, unspecified, uncomplicated: Secondary | ICD-10-CM | POA: Insufficient documentation

## 2012-04-26 DIAGNOSIS — Z9861 Coronary angioplasty status: Secondary | ICD-10-CM | POA: Insufficient documentation

## 2012-04-26 NOTE — ED Notes (Signed)
Alert, talking, Dr Bebe Shaggy in  to see pt
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
## 2012-09-07 ENCOUNTER — Encounter (INDEPENDENT_AMBULATORY_CARE_PROVIDER_SITE_OTHER): Payer: Self-pay | Admitting: *Deleted

## 2013-01-28 ENCOUNTER — Other Ambulatory Visit (INDEPENDENT_AMBULATORY_CARE_PROVIDER_SITE_OTHER): Payer: Self-pay | Admitting: *Deleted

## 2013-01-28 ENCOUNTER — Telehealth (INDEPENDENT_AMBULATORY_CARE_PROVIDER_SITE_OTHER): Payer: Self-pay | Admitting: *Deleted

## 2013-01-28 ENCOUNTER — Encounter (INDEPENDENT_AMBULATORY_CARE_PROVIDER_SITE_OTHER): Payer: Self-pay | Admitting: *Deleted

## 2013-01-28 DIAGNOSIS — Z1211 Encounter for screening for malignant neoplasm of colon: Secondary | ICD-10-CM

## 2013-01-28 DIAGNOSIS — Z800 Family history of malignant neoplasm of digestive organs: Secondary | ICD-10-CM

## 2013-01-28 MED ORDER — PEG-KCL-NACL-NASULF-NA ASC-C 100 G PO SOLR: 1.0000 | Freq: Once | ORAL | Status: DC

## 2013-01-28 NOTE — Telephone Encounter (Signed)
Patient needs movi prep 

## 2013-03-02 ENCOUNTER — Telehealth (INDEPENDENT_AMBULATORY_CARE_PROVIDER_SITE_OTHER): Payer: Self-pay | Admitting: *Deleted

## 2013-03-02 NOTE — Telephone Encounter (Signed)
  Procedure: tcs  Reason/Indication:  Screening, fam hx colon ca  Has patient had this procedure before?  no  If so, when, by whom and where?    Is there a family history of colon cancer?  Yes, uncle  Who?  What age when diagnosed?    Is patient diabetic?   yes      Does patient have prosthetic heart valve?  no  Do you have a pacemaker?  no  Has patient ever had endocarditis? no  Has patient had joint replacement within last 12 months?  no  Does patient tend to be constipated or take laxatives? no  Is patient on Coumadin, Plavix and/or Aspirin? yes  Medications: colon cleanser daily, asa 81 mg daily, hydrocodone, tramadol, xanax, metformin 500 mg 3 tab in am & 2 tan in pm, amlodipine, lisinopril, omeprazole, lantus 20 mg nightly  Allergies: nkda  Medication Adjustment: asa 2 days, hold metformin evening before & morning of, 1/2 lantus night before  Procedure date & time: 03/31/13 at 730

## 2013-03-03 NOTE — Telephone Encounter (Signed)
agree

## 2013-03-08 ENCOUNTER — Encounter (HOSPITAL_COMMUNITY): Payer: Self-pay | Admitting: Pharmacy Technician

## 2013-03-31 ENCOUNTER — Encounter (HOSPITAL_COMMUNITY): Payer: Self-pay | Admitting: *Deleted

## 2013-03-31 ENCOUNTER — Encounter (HOSPITAL_COMMUNITY)
Admission: RE | Disposition: A | Discharge status: Home / Self Care | Payer: Self-pay | Source: Ambulatory Visit | Attending: Internal Medicine

## 2013-03-31 ENCOUNTER — Ambulatory Visit (HOSPITAL_COMMUNITY)
Admission: RE | Admit: 2013-03-31 | Discharge: 2013-03-31 | Disposition: A | Discharge status: Home / Self Care | Payer: 59 | Source: Ambulatory Visit | Attending: Internal Medicine | Admitting: Internal Medicine

## 2013-03-31 DIAGNOSIS — K644 Residual hemorrhoidal skin tags: Secondary | ICD-10-CM

## 2013-03-31 DIAGNOSIS — Z7982 Long term (current) use of aspirin: Secondary | ICD-10-CM | POA: Insufficient documentation

## 2013-03-31 DIAGNOSIS — Z1211 Encounter for screening for malignant neoplasm of colon: Secondary | ICD-10-CM | POA: Insufficient documentation

## 2013-03-31 DIAGNOSIS — E119 Type 2 diabetes mellitus without complications: Secondary | ICD-10-CM | POA: Insufficient documentation

## 2013-03-31 DIAGNOSIS — K5730 Diverticulosis of large intestine without perforation or abscess without bleeding: Secondary | ICD-10-CM

## 2013-03-31 DIAGNOSIS — D126 Benign neoplasm of colon, unspecified: Secondary | ICD-10-CM | POA: Insufficient documentation

## 2013-03-31 DIAGNOSIS — Z800 Family history of malignant neoplasm of digestive organs: Secondary | ICD-10-CM

## 2013-03-31 DIAGNOSIS — K219 Gastro-esophageal reflux disease without esophagitis: Secondary | ICD-10-CM | POA: Insufficient documentation

## 2013-03-31 DIAGNOSIS — I10 Essential (primary) hypertension: Secondary | ICD-10-CM | POA: Insufficient documentation

## 2013-03-31 DIAGNOSIS — Z79899 Other long term (current) drug therapy: Secondary | ICD-10-CM | POA: Insufficient documentation

## 2013-03-31 HISTORY — DX: Headache: R51

## 2013-03-31 HISTORY — PX: COLONOSCOPY: SHX5424

## 2013-03-31 HISTORY — DX: Gastro-esophageal reflux disease without esophagitis: K21.9

## 2013-03-31 LAB — GLUCOSE, CAPILLARY: GLUCOSE-CAPILLARY: 160 mg/dL — AB (ref 70–99)

## 2013-03-31 SURGERY — COLONOSCOPY
Anesthesia: Moderate Sedation

## 2013-03-31 MED ORDER — STERILE WATER FOR IRRIGATION IR SOLN
Status: DC | PRN
  Administered 2013-03-31: 08:00:00

## 2013-03-31 MED ORDER — MEPERIDINE HCL 50 MG/ML IJ SOLN
INTRAMUSCULAR | Status: DC | PRN
  Administered 2013-03-31 (×2): 25 mg via INTRAVENOUS

## 2013-03-31 MED ORDER — MIDAZOLAM HCL 5 MG/5ML IJ SOLN
INTRAMUSCULAR | Status: DC | PRN
  Administered 2013-03-31 (×2): 3 mg via INTRAVENOUS
  Administered 2013-03-31: 2 mg via INTRAVENOUS

## 2013-03-31 MED ORDER — SODIUM CHLORIDE 0.9 % IV SOLN
INTRAVENOUS | Status: DC
  Administered 2013-03-31: 07:00:00 via INTRAVENOUS

## 2013-03-31 MED ORDER — MIDAZOLAM HCL 5 MG/5ML IJ SOLN
INTRAMUSCULAR | Status: AC
  Filled 2013-03-31: qty 10

## 2013-03-31 MED ORDER — MEPERIDINE HCL 50 MG/ML IJ SOLN
INTRAMUSCULAR | Status: AC
  Filled 2013-03-31: qty 1

## 2013-03-31 NOTE — Op Note (Signed)
COLONOSCOPY PROCEDURE REPORT  PATIENT:  [name]  MR#:  [id number] Birthdate:  09-01-1960, 53 y.o., male Endoscopist:  Dr. Rogene Houston, MD Referred By:  Dr. Sherrilee Gilles. Gerarda Fraction, MD Procedure Date: 03/31/2013  Procedure:   Colonoscopy  Indications:  Patient is 53 year old Hispanic male who is undergoing screening colonoscopy. Family history is significant for colon carcinoma in two maternal uncles who were her early 15s at the time of diagnosis. Family history is also positive for non-GI malignancies on mother's side of the family.  Informed Consent:  The procedure and risks were reviewed with the patient and informed consent was obtained.  Medications:  Demerol 50 mg IV Versed 10 mg IV  Description of procedure:  After a digital rectal exam was performed, that colonoscope was advanced from the anus through the rectum and colon to the area of the cecum, ileocecal valve and appendiceal orifice. The cecum was deeply intubated. These structures were well-seen and photographed for the record. From the level of the cecum and ileocecal valve, the scope was slowly and cautiously withdrawn. The mucosal surfaces were carefully surveyed utilizing scope tip to flexion to facilitate fold flattening as needed. The scope was pulled down into the rectum where a thorough exam including retroflexion was performed.  Findings:   Prep satisfactory. Three small polyps were ablated while cold biopsy. Two were located at cecum and one at ascending colon. These polyps were submitted together. Few scattered diverticula in sigmoid colon. Normal rectal mucosa. Small hemorrhoids below the dentate line.   Therapeutic/Diagnostic Maneuvers Performed:  See above  Complications:  None  Cecal Withdrawal Time:  15 minutes  Impression:  Examination performed to cecum. Three small polyps ablated via cold biopsy and submitted together(2 which cecum and one at ascending colon). Mild sigmoid colon  diverticulosis. Small external hemorrhoids.  Recommendations:  Standard instructions given. High fiber diet. I will contact patient with biopsy results and further recommendations.  [name] U  03/31/2013 8:15 AM  CC: Dr. Glo Herring., MD & Dr. Rayne Du ref. provider found

## 2013-03-31 NOTE — Discharge Instructions (Signed)
Resume usual medications and high fiber diet. No driving for 24 hours. Physician will contact you with biopsy results.   Colonoscopy, Care After Refer to this sheet in the next few weeks. These instructions provide you with information on caring for yourself after your procedure. Your health care provider may also give you more specific instructions. Your treatment has been planned according to current medical practices, but problems sometimes occur. Call your health care provider if you have any problems or questions after your procedure. WHAT TO EXPECT AFTER THE PROCEDURE  After your procedure, it is typical to have the following:  A small amount of blood in your stool.  Moderate amounts of gas and mild abdominal cramping or bloating. HOME CARE INSTRUCTIONS  Do not drive, operate machinery, or sign important documents for 24 hours.  You may shower and resume your regular physical activities, but move at a slower pace for the first 24 hours.  Take frequent rest periods for the first 24 hours.  Walk around or put a warm pack on your abdomen to help reduce abdominal cramping and bloating.  Drink enough fluids to keep your urine clear or pale yellow.  You may resume your normal diet as instructed by your health care provider. Avoid heavy or fried foods that are hard to digest.  Avoid drinking alcohol for 24 hours or as instructed by your health care provider.  Only take over-the-counter or prescription medicines as directed by your health care provider.  If a tissue sample (biopsy) was taken during your procedure:  Do not take aspirin or blood thinners for 7 days, or as instructed by your health care provider.  Do not drink alcohol for 7 days, or as instructed by your health care provider.  Eat soft foods for the first 24 hours. SEEK MEDICAL CARE IF: You have persistent spotting of blood in your stool 2 3 days after the procedure. SEEK IMMEDIATE MEDICAL CARE IF:  You have more  than a small spotting of blood in your stool.  You pass large blood clots in your stool.  Your abdomen is swollen (distended).  You have nausea or vomiting.  You have a fever.  You have increasing abdominal pain that is not relieved with medicine. Document Released: 08/21/2003 Document Revised: 10/27/2012 Document Reviewed: 09/13/2012 [facility] Patient Information 2014 [place].   High-Fiber Diet Fiber is found in fruits, vegetables, and grains. A high-fiber diet encourages the addition of more whole grains, legumes, fruits, and vegetables in your diet. The recommended amount of fiber for adult males is 38 g per day. For adult females, it is 25 g per day. Pregnant and lactating women should get 28 g of fiber per day. If you have a digestive or bowel problem, ask your caregiver for advice before adding high-fiber foods to your diet. Eat a variety of high-fiber foods instead of only a select few type of foods.  PURPOSE  To increase stool bulk.  To make bowel movements more regular to prevent constipation.  To lower cholesterol.  To prevent overeating. WHEN IS THIS DIET USED?  It may be used if you have constipation and hemorrhoids.  It may be used if you have uncomplicated diverticulosis (intestine condition) and irritable bowel syndrome.  It may be used if you need help with weight management.  It may be used if you want to add it to your diet as a protective measure against atherosclerosis, diabetes, and cancer. SOURCES OF FIBER  Whole-grain breads and cereals.  Fruits, such  as apples, oranges, bananas, berries, prunes, and pears.  Vegetables, such as green peas, carrots, sweet potatoes, beets, broccoli, cabbage, spinach, and artichokes.  Legumes, such split peas, soy, lentils.  Almonds. FIBER CONTENT IN FOODS Starches and Grains / Dietary Fiber (g)  Cheerios, 1 cup / 3 g  Corn Flakes cereal, 1 cup / 0.7 g  Rice crispy treat cereal, 1 cup / 0.3 g  Instant  oatmeal (cooked),  cup / 2 g  Frosted wheat cereal, 1 cup / 5.1 g  Brown, long-grain rice (cooked), 1 cup / 3.5 g  Kalven Ganim, long-grain rice (cooked), 1 cup / 0.6 g  Enriched macaroni (cooked), 1 cup / 2.5 g Legumes / Dietary Fiber (g)  Baked beans (canned, plain, or vegetarian),  cup / 5.2 g  Kidney beans (canned),  cup / 6.8 g  Pinto beans (cooked),  cup / 5.5 g Breads and Crackers / Dietary Fiber (g)  Plain or honey graham crackers, 2 squares / 0.7 g  Saltine crackers, 3 squares / 0.3 g  Plain, salted pretzels, 10 pieces / 1.8 g  Whole-wheat bread, 1 slice / 1.9 g  Hildegard Hlavac bread, 1 slice / 0.7 g  Raisin bread, 1 slice / 1.2 g  Plain bagel, 3 oz / 2 g  Flour tortilla, 1 oz / 0.9 g  Corn tortilla, 1 small / 1.5 g  Hamburger or hotdog bun, 1 small / 0.9 g Fruits / Dietary Fiber (g)  Apple with skin, 1 medium / 4.4 g  Sweetened applesauce,  cup / 1.5 g  Banana,  medium / 1.5 g  Grapes, 10 grapes / 0.4 g  Orange, 1 small / 2.3 g  Raisin, 1.5 oz / 1.6 g  Melon, 1 cup / 1.4 g Vegetables / Dietary Fiber (g)  Green beans (canned),  cup / 1.3 g  Carrots (cooked),  cup / 2.3 g  Broccoli (cooked),  cup / 2.8 g  Peas (cooked),  cup / 4.4 g  Mashed potatoes,  cup / 1.6 g  Lettuce, 1 cup / 0.5 g  Corn (canned),  cup / 1.6 g  Tomato,  cup / 1.1 g Document Released: 01/06/2005 Document Revised: 07/08/2011 Document Reviewed: 04/10/2011 [facility] Patient Information 2014 [place], [place].

## 2013-03-31 NOTE — H&P (Signed)
[name] is an 53 y.o. male.   Chief Complaint: Patient is  here for colonoscopy. HPI: Patient is 53 year old Hispanic male was in screening colonoscopy. He denies recent change in his bowel habits or rectal bleeding. He takes colon cancer daily to and constipation. Family history is negative for CRC in first degree relatives. However he lost 2 maternal uncles with colon carcinoma and in her early 75s. Family history is also significant for other malignancies on mother side of the family.  Past Medical History  Diagnosis Date  . Diabetes mellitus   . Arthritis   . Hypertension   . Kidney stones   . GERD (gastroesophageal reflux disease)   . ZOXWRUEA(540.9)     Past Surgical History  Procedure Laterality Date  . Cardiac catheterization  2009  . Esophagogastroduodenoscopy  08/25/2010    Procedure: ESOPHAGOGASTRODUODENOSCOPY (EGD);  Surgeon: Rogene Houston, MD;  Location: [facility];  Service: Endoscopy;  Laterality: N/A;    Family History  Problem Relation Age of Onset  . Diabetes Mother   . Heart failure Mother   . Heart disease Mother   . Heart failure Father    Social History:  reports that he has been smoking.  He does not have any smokeless tobacco history on file. He reports that he does not drink alcohol or use illicit drugs.  Allergies: No Known Allergies  Medications Prior to Admission  Medication Sig Dispense Refill  . ALPRAZolam (XANAX) 0.5 MG tablet Take 0.5 mg by mouth at bedtime as needed. FOR  MOOD STABILIZER      . amLODipine (NORVASC) 10 MG tablet Take 10 mg by mouth daily.      Marland Kitchen aspirin 325 MG tablet Take 325 mg by mouth every morning.      Marland Kitchen HYDROcodone-acetaminophen (NORCO) 10-325 MG per tablet Take 1 tablet by mouth every 4 (four) hours as needed for pain.      . Insulin Glargine (LANTUS SOLOSTAR [place]) Inject 25-35 Units into the skin at bedtime. IF  SUGAR LEVEL IS ABOVE 250 USE 35 UNITS       . lisinopril (PRINIVIL,ZESTRIL) 20 MG tablet Take 40  mg by mouth every morning.       . metFORMIN (GLUCOPHAGE) 500 MG tablet Take 1,000-1,500 mg by mouth 2 (two) times daily. Takes three tablets in the morning and and two tablets in the afternoon      . Multiple Vitamin (MULTIVITAMIN WITH MINERALS) TABS tablet Take 1 tablet by mouth daily.      Marland Kitchen omeprazole (PRILOSEC OTC) 20 MG tablet Take 20 mg by mouth every morning.      . peg 3350 powder (MOVIPREP) 100 G SOLR Take 1 kit (200 g total) by mouth once.  1 kit  0    Results for orders placed during the hospital encounter of 03/31/13 (from the past 48 hour(s))  GLUCOSE, CAPILLARY     Status: Abnormal   Collection Time    03/31/13  6:48 AM      Result Value Ref Range   Glucose-Capillary 160 (*) 70 - 99 mg/dL   No results found.  ROS  Blood pressure 125/84, pulse 86, temperature 98 F (36.7 C), temperature source Oral, resp. rate 14, height _0  (1.753 m), weight 232 lb (105.235 kg), SpO2 97.00%. Physical Exam  Constitutional: He appears well-developed and well-nourished.  HENT:  Mouth/Throat: Oropharynx is clear and moist.  Eyes: Conjunctivae are normal. No scleral icterus.  Neck: No thyromegaly present.  Cardiovascular:  Normal rate, regular rhythm and normal heart sounds.   No murmur heard. Respiratory: Effort normal and breath sounds normal.  GI: Soft. He exhibits no distension. There is no tenderness.  Multiple areas of focal erythema at sites of insulin injections  Musculoskeletal: He exhibits no edema.  Lymphadenopathy:    He has no cervical adenopathy.  Neurological: He is alert.  Skin: Skin is warm and dry.     Assessment/Plan Screening colonoscopy. Family history of colon carcinoma in two second-degree relatives.  JerryNAJEEB Reilly 03/31/2013, 7:40 AM

## 2013-04-05 ENCOUNTER — Encounter (HOSPITAL_COMMUNITY): Payer: Self-pay | Admitting: Internal Medicine

## 2013-04-14 ENCOUNTER — Encounter (INDEPENDENT_AMBULATORY_CARE_PROVIDER_SITE_OTHER): Payer: Self-pay | Admitting: *Deleted

## 2013-10-14 ENCOUNTER — Other Ambulatory Visit (HOSPITAL_COMMUNITY): Payer: Self-pay | Admitting: Internal Medicine

## 2013-10-14 ENCOUNTER — Ambulatory Visit (HOSPITAL_COMMUNITY)
Admission: RE | Admit: 2013-10-14 | Discharge: 2013-10-14 | Disposition: A | Discharge status: Home / Self Care | Payer: 59 | Source: Ambulatory Visit | Attending: Internal Medicine | Admitting: Internal Medicine

## 2013-10-14 DIAGNOSIS — R079 Chest pain, unspecified: Secondary | ICD-10-CM

## 2013-10-14 DIAGNOSIS — R1310 Dysphagia, unspecified: Secondary | ICD-10-CM

## 2013-10-18 ENCOUNTER — Other Ambulatory Visit (HOSPITAL_COMMUNITY): Payer: 59

## 2013-10-21 ENCOUNTER — Emergency Department (HOSPITAL_COMMUNITY): Payer: 59

## 2013-10-21 ENCOUNTER — Encounter (HOSPITAL_COMMUNITY): Payer: Self-pay | Admitting: Emergency Medicine

## 2013-10-21 ENCOUNTER — Inpatient Hospital Stay (HOSPITAL_COMMUNITY)
Admission: EM | Admit: 2013-10-21 | Discharge: 2013-10-25 | DRG: 418 | Disposition: A | Discharge status: Home / Self Care | Payer: 59 | Attending: Internal Medicine | Admitting: Internal Medicine

## 2013-10-21 DIAGNOSIS — R1310 Dysphagia, unspecified: Secondary | ICD-10-CM | POA: Diagnosis present

## 2013-10-21 DIAGNOSIS — Z7982 Long term (current) use of aspirin: Secondary | ICD-10-CM

## 2013-10-21 DIAGNOSIS — Z79891 Long term (current) use of opiate analgesic: Secondary | ICD-10-CM | POA: Diagnosis not present

## 2013-10-21 DIAGNOSIS — E1165 Type 2 diabetes mellitus with hyperglycemia: Secondary | ICD-10-CM

## 2013-10-21 DIAGNOSIS — Z833 Family history of diabetes mellitus: Secondary | ICD-10-CM

## 2013-10-21 DIAGNOSIS — E872 Acidosis: Secondary | ICD-10-CM | POA: Diagnosis present

## 2013-10-21 DIAGNOSIS — Z87891 Personal history of nicotine dependence: Secondary | ICD-10-CM | POA: Diagnosis not present

## 2013-10-21 DIAGNOSIS — K851 Biliary acute pancreatitis without necrosis or infection: Secondary | ICD-10-CM

## 2013-10-21 DIAGNOSIS — K219 Gastro-esophageal reflux disease without esophagitis: Secondary | ICD-10-CM | POA: Diagnosis present

## 2013-10-21 DIAGNOSIS — K8050 Calculus of bile duct without cholangitis or cholecystitis without obstruction: Secondary | ICD-10-CM

## 2013-10-21 DIAGNOSIS — Z23 Encounter for immunization: Secondary | ICD-10-CM | POA: Diagnosis not present

## 2013-10-21 DIAGNOSIS — K222 Esophageal obstruction: Secondary | ICD-10-CM

## 2013-10-21 DIAGNOSIS — R1011 Right upper quadrant pain: Secondary | ICD-10-CM

## 2013-10-21 DIAGNOSIS — M1990 Unspecified osteoarthritis, unspecified site: Secondary | ICD-10-CM | POA: Diagnosis present

## 2013-10-21 DIAGNOSIS — Z8249 Family history of ischemic heart disease and other diseases of the circulatory system: Secondary | ICD-10-CM

## 2013-10-21 DIAGNOSIS — K8020 Calculus of gallbladder without cholecystitis without obstruction: Secondary | ICD-10-CM

## 2013-10-21 DIAGNOSIS — Z79899 Other long term (current) drug therapy: Secondary | ICD-10-CM | POA: Diagnosis not present

## 2013-10-21 DIAGNOSIS — G8929 Other chronic pain: Secondary | ICD-10-CM

## 2013-10-21 DIAGNOSIS — E119 Type 2 diabetes mellitus without complications: Secondary | ICD-10-CM

## 2013-10-21 DIAGNOSIS — I10 Essential (primary) hypertension: Secondary | ICD-10-CM

## 2013-10-21 DIAGNOSIS — K859 Acute pancreatitis, unspecified: Secondary | ICD-10-CM

## 2013-10-21 LAB — COMPREHENSIVE METABOLIC PANEL
ALBUMIN: 4.7 g/dL (ref 3.5–5.2)
ALK PHOS: 97 U/L (ref 39–117)
ALT: 36 U/L (ref 0–53)
ANION GAP: 18 — AB (ref 5–15)
AST: 21 U/L (ref 0–37)
BILIRUBIN TOTAL: 0.6 mg/dL (ref 0.3–1.2)
BUN: 12 mg/dL (ref 6–23)
CHLORIDE: 94 meq/L — AB (ref 96–112)
CO2: 27 meq/L (ref 19–32)
Calcium: 9.8 mg/dL (ref 8.4–10.5)
Creatinine, Ser: 0.7 mg/dL (ref 0.50–1.35)
GFR calc Af Amer: >90 mL/min (ref >90)
Glucose, Bld: 274 mg/dL — ABNORMAL HIGH (ref 70–99)
POTASSIUM: 3.9 meq/L (ref 3.7–5.3)
Sodium: 139 mEq/L (ref 137–147)
Total Protein: 8.4 g/dL — ABNORMAL HIGH (ref 6.0–8.3)

## 2013-10-21 LAB — CBC WITH DIFFERENTIAL/PLATELET
Basophils Absolute: 0.1 10*3/uL (ref 0.0–0.1)
Basophils Relative: 0 % (ref 0–1)
Eosinophils Absolute: 0.1 10*3/uL (ref 0.0–0.7)
Eosinophils Relative: 1 % (ref 0–5)
HEMATOCRIT: 48.8 % (ref 39.0–52.0)
HEMOGLOBIN: 17.5 g/dL — AB (ref 13.0–17.0)
LYMPHS ABS: 3.1 10*3/uL (ref 0.7–4.0)
LYMPHS PCT: 28 % (ref 12–46)
MCH: 31.9 pg (ref 26.0–34.0)
MCHC: 35.9 g/dL (ref 30.0–36.0)
MCV: 88.9 fL (ref 78.0–100.0)
MONOS PCT: 5 % (ref 3–12)
Monocytes Absolute: 0.6 10*3/uL (ref 0.1–1.0)
NEUTROS ABS: 7.3 10*3/uL (ref 1.7–7.7)
NEUTROS PCT: 66 % (ref 43–77)
Platelets: 192 10*3/uL (ref 150–400)
RBC: 5.49 MIL/uL (ref 4.22–5.81)
RDW: 12.9 % (ref 11.5–15.5)
WBC: 11.2 10*3/uL — AB (ref 4.0–10.5)

## 2013-10-21 LAB — URINALYSIS, ROUTINE W REFLEX MICROSCOPIC
Bilirubin Urine: NEGATIVE
GLUCOSE, UA: 500 mg/dL — AB
Hgb urine dipstick: NEGATIVE
Leukocytes, UA: NEGATIVE
Nitrite: NEGATIVE
Protein, ur: 100 mg/dL — AB
Specific Gravity, Urine: 1.025 (ref 1.005–1.030)
Urobilinogen, UA: 0.2 mg/dL (ref 0.0–1.0)
pH: 7 (ref 5.0–8.0)

## 2013-10-21 LAB — LIPASE, BLOOD: Lipase: 1121 U/L — ABNORMAL HIGH (ref 11–59)

## 2013-10-21 LAB — URINE MICROSCOPIC-ADD ON

## 2013-10-21 LAB — GLUCOSE, CAPILLARY: Glucose-Capillary: 205 mg/dL — ABNORMAL HIGH (ref 70–99)

## 2013-10-21 LAB — AMYLASE: Amylase: 412 U/L — ABNORMAL HIGH (ref 0–105)

## 2013-10-21 MED ORDER — INSULIN ASPART 100 UNIT/ML ~~LOC~~ SOLN
0.0000 [IU] | Freq: Every day | SUBCUTANEOUS | Status: DC
  Administered 2013-10-21: 2 [IU] via SUBCUTANEOUS
  Administered 2013-10-24: 3 [IU] via SUBCUTANEOUS

## 2013-10-21 MED ORDER — POLYETHYLENE GLYCOL 3350 17 G PO PACK: 17.0000 g | PACK | Freq: Every day | ORAL | Status: DC | PRN

## 2013-10-21 MED ORDER — INSULIN ASPART 100 UNIT/ML ~~LOC~~ SOLN: 5.0000 [IU] | Freq: Once | SUBCUTANEOUS | Status: DC

## 2013-10-21 MED ORDER — IOHEXOL 300 MG/ML  SOLN
50.0000 mL | Freq: Once | INTRAMUSCULAR | Status: AC | PRN
  Administered 2013-10-21: 50 mL via ORAL

## 2013-10-21 MED ORDER — SENNA 8.6 MG PO TABS
1.0000 | ORAL_TABLET | Freq: Two times a day (BID) | ORAL | Status: DC
  Administered 2013-10-21 – 2013-10-25 (×7): 8.6 mg via ORAL
  Filled 2013-10-21 (×8): qty 1

## 2013-10-21 MED ORDER — PNEUMOCOCCAL VAC POLYVALENT 25 MCG/0.5ML IJ INJ
0.5000 mL | INJECTION | INTRAMUSCULAR | Status: DC
  Filled 2013-10-21: qty 0.5

## 2013-10-21 MED ORDER — KCL IN DEXTROSE-NACL 20-5-0.45 MEQ/L-%-% IV SOLN
INTRAVENOUS | Status: DC
  Administered 2013-10-21 – 2013-10-22 (×2): via INTRAVENOUS

## 2013-10-21 MED ORDER — ONDANSETRON HCL 4 MG/2ML IJ SOLN
4.0000 mg | Freq: Four times a day (QID) | INTRAMUSCULAR | Status: DC | PRN
  Administered 2013-10-24: 4 mg via INTRAVENOUS
  Filled 2013-10-21: qty 2

## 2013-10-21 MED ORDER — INSULIN ASPART 100 UNIT/ML ~~LOC~~ SOLN
0.0000 [IU] | Freq: Three times a day (TID) | SUBCUTANEOUS | Status: DC
  Administered 2013-10-22: 7 [IU] via SUBCUTANEOUS
  Administered 2013-10-22: 3 [IU] via SUBCUTANEOUS
  Administered 2013-10-22: 5 [IU] via SUBCUTANEOUS
  Administered 2013-10-23 (×2): 3 [IU] via SUBCUTANEOUS
  Administered 2013-10-23: 2 [IU] via SUBCUTANEOUS
  Administered 2013-10-24: 3 [IU] via SUBCUTANEOUS
  Administered 2013-10-24: 2 [IU] via SUBCUTANEOUS
  Administered 2013-10-25: 5 [IU] via SUBCUTANEOUS

## 2013-10-21 MED ORDER — ASPIRIN EC 81 MG PO TBEC
81.0000 mg | DELAYED_RELEASE_TABLET | Freq: Every day | ORAL | Status: DC
  Administered 2013-10-21 – 2013-10-23 (×2): 81 mg via ORAL
  Filled 2013-10-21 (×2): qty 1

## 2013-10-21 MED ORDER — MORPHINE SULFATE 4 MG/ML IJ SOLN
4.0000 mg | INTRAMUSCULAR | Status: DC | PRN
  Administered 2013-10-21 – 2013-10-22 (×3): 4 mg via INTRAVENOUS
  Filled 2013-10-21 (×4): qty 1

## 2013-10-21 MED ORDER — MORPHINE SULFATE 4 MG/ML IJ SOLN
4.0000 mg | Freq: Once | INTRAMUSCULAR | Status: AC
  Administered 2013-10-21: 4 mg via INTRAVENOUS
  Filled 2013-10-21: qty 1

## 2013-10-21 MED ORDER — DILTIAZEM HCL 25 MG/5ML IV SOLN
20.0000 mg | INTRAVENOUS | Status: DC
  Administered 2013-10-21 – 2013-10-22 (×5): 20 mg via INTRAVENOUS
  Filled 2013-10-21 (×5): qty 5

## 2013-10-21 MED ORDER — SODIUM CHLORIDE 0.9 % IV SOLN
1000.0000 mL | Freq: Once | INTRAVENOUS | Status: AC
  Administered 2013-10-21: 1000 mL via INTRAVENOUS

## 2013-10-21 MED ORDER — ALPRAZOLAM 0.5 MG PO TABS
0.5000 mg | ORAL_TABLET | Freq: Every evening | ORAL | Status: DC | PRN
  Administered 2013-10-21 – 2013-10-24 (×3): 0.5 mg via ORAL
  Filled 2013-10-21 (×3): qty 1

## 2013-10-21 MED ORDER — SODIUM CHLORIDE 0.9 % IV SOLN
1000.0000 mL | INTRAVENOUS | Status: DC
  Administered 2013-10-21: 1000 mL via INTRAVENOUS

## 2013-10-21 MED ORDER — HYDRALAZINE HCL 20 MG/ML IJ SOLN: 10.0000 mg | INTRAMUSCULAR | Status: DC | PRN

## 2013-10-21 MED ORDER — HEPARIN SODIUM (PORCINE) 5000 UNIT/ML IJ SOLN
5000.0000 [IU] | Freq: Three times a day (TID) | INTRAMUSCULAR | Status: DC
  Administered 2013-10-21 – 2013-10-23 (×4): 5000 [IU] via SUBCUTANEOUS
  Filled 2013-10-21 (×5): qty 1

## 2013-10-21 MED ORDER — IOHEXOL 300 MG/ML  SOLN
100.0000 mL | Freq: Once | INTRAMUSCULAR | Status: AC | PRN
  Administered 2013-10-21: 100 mL via INTRAVENOUS

## 2013-10-21 MED ORDER — ONDANSETRON HCL 4 MG PO TABS
4.0000 mg | ORAL_TABLET | Freq: Four times a day (QID) | ORAL | Status: DC | PRN
  Administered 2013-10-25: 4 mg via ORAL
  Filled 2013-10-21: qty 1

## 2013-10-21 MED ORDER — PANTOPRAZOLE SODIUM 40 MG IV SOLR
40.0000 mg | INTRAVENOUS | Status: DC
Start: 2013-10-21 — End: 2013-10-25
  Administered 2013-10-21 – 2013-10-24 (×4): 40 mg via INTRAVENOUS
  Filled 2013-10-21 (×4): qty 40

## 2013-10-21 MED ORDER — ONDANSETRON HCL 4 MG/2ML IJ SOLN
4.0000 mg | Freq: Once | INTRAMUSCULAR | Status: AC
  Administered 2013-10-21: 4 mg via INTRAVENOUS
  Filled 2013-10-21: qty 2

## 2013-10-21 NOTE — ED Provider Notes (Signed)
CSN: [id number]     Arrival date & time 10/21/13  1325 History  This chart was scribed for Janice Norrie, MD by Delphia Grates, ED Scribe. This patient was seen in room APA11/APA11 and the patient's care was started at 5:07 PM.     Chief Complaint  Patient presents with  . Abdominal Pain    The history is provided by the patient. No language interpreter was used.    HPI Comments: SHANNEN VERNON is a 53 y.o. male who presents to the Emergency Department complaining of constant, sharp, waxing and waning RUQ abdominal pain that started yesterday morning. Patient rates his pain as 10/10 although he said it was hurting more earlier. He had nausea today but no vomiting. No diarrhea. Takes colon pills daily to prevent constipation.  No fever. But eyes are buring. Movement makes the pain worse, but he reports no pain with deep breathing. No alleviating factors. Patient had a sandwich and two hotdogs yesteday but eating did not make pain worse. No history of the same. No abdominal surgeries. Appointment October 9th to have a swallow test and has CXR last week to evaluate chest pressure. Patient is a nonsmoker and does not consume EtOH. Patient has history of kidney stones, but states this does not feel similar. Patient still has his gallbladder, but states his mother and sisters all had an cholecysectomy. States this pain is different from his prior kidney stones.   PCP Dr. Gerarda Fraction   Past Medical History  Diagnosis Date  . Diabetes mellitus   . Arthritis   . Hypertension   . GERD (gastroesophageal reflux disease)   . Headache(784.0)   . Kidney stones    Past Surgical History  Procedure Laterality Date  . Esophagogastroduodenoscopy  08/25/2010    Procedure: ESOPHAGOGASTRODUODENOSCOPY (EGD);  Surgeon: Rogene Houston, MD;  Location: [facility];  Service: Endoscopy;  Laterality: N/A;  . Colonoscopy N/A 03/31/2013    Procedure: COLONOSCOPY;  Surgeon: Rogene Houston, MD;  Location: [facility];  Service: Endoscopy;  Laterality: N/A;  730  . Cardiac catheterization  2009   Family History  Problem Relation Age of Onset  . Diabetes Mother   . Heart failure Mother   . Heart disease Mother   . Heart failure Father    History  Substance Use Topics  . Smoking status: Former Smoker    Types: Cigarettes  . Smokeless tobacco: Not on file  . Alcohol Use: No  lives at home employed  Review of Systems  Gastrointestinal: Positive for nausea and abdominal pain. Negative for vomiting and diarrhea.  All other systems reviewed and are negative.     Allergies  Review of patient's allergies indicates no known allergies.  Home Medications   Prior to Admission medications   Medication Sig Start Date End Date Taking? Authorizing Provider  acetaminophen (TYLENOL) 500 MG tablet Take 1,500 mg by mouth every 6 (six) hours as needed for headache.   Yes Historical Provider, MD  ALPRAZolam Duanne Moron) 0.5 MG tablet Take 0.5 mg by mouth at bedtime as needed. FOR  MOOD STABILIZER   Yes Historical Provider, MD  aspirin EC 81 MG tablet Take 81 mg by mouth daily.   Yes Historical Provider, MD  diltiazem (TIAZAC) 240 MG 24 hr capsule Take 240 mg by mouth daily.   Yes Historical Provider, MD  HYDROcodone-acetaminophen (NORCO) 10-325 MG per tablet Take 1 tablet by mouth every 4 (four) hours as needed for pain.   Yes  Historical Provider, MD  lisinopril (PRINIVIL,ZESTRIL) 40 MG tablet Take 40 mg by mouth daily.   Yes Historical Provider, MD  metFORMIN (GLUCOPHAGE) 500 MG tablet Take 1,000-1,500 mg by mouth 2 (two) times daily. Takes three tablets in the morning and and two tablets in the afternoon   Yes Historical Provider, MD  Multiple Vitamin (MULTIVITAMIN WITH MINERALS) TABS tablet Take 1 tablet by mouth daily.   Yes Historical Provider, MD  omeprazole (PRILOSEC OTC) 20 MG tablet Take 20 mg by mouth every morning. 08/26/10 10/21/13 Yes Annita Brod, MD  OxyCODONE (OXYCONTIN) 10 mg T12A 12 hr  tablet Take 10 mg by mouth every 12 (twelve) hours as needed (severe pain).   Yes Historical Provider, MD   Triage Vitals: BP 142/91  Pulse 113  Temp(Src) 98.8 F (37.1 C) (Oral)  Resp 18  Ht 5\' 9"  (1.753 m)  Wt 235 lb (106.595 kg)  BMI 34.69 kg/m2  SpO2 99%  Vital signs normal except for tachycardia   Physical Exam  Nursing note and vitals reviewed. Constitutional: He is oriented to person, place, and time. He appears well-developed and well-nourished.  Non-toxic appearance. He does not appear ill. No distress.  HENT:  Head: Normocephalic and atraumatic.  Right Ear: External ear normal.  Left Ear: External ear normal.  Nose: Nose normal. No mucosal edema or rhinorrhea.  Mouth/Throat: Oropharynx is clear and moist and mucous membranes are normal. No dental abscesses or uvula swelling.  Tongue is dry.  Eyes: Conjunctivae and EOM are normal. Pupils are equal, round, and reactive to light.  Neck: Normal range of motion and full passive range of motion without pain. Neck supple.  Cardiovascular: Normal rate, regular rhythm and normal heart sounds.  Exam reveals no gallop and no friction rub.   No murmur heard. Pulmonary/Chest: Effort normal and breath sounds normal. No respiratory distress. He has no wheezes. He has no rhonchi. He has no rales. He exhibits no tenderness and no crepitus.  Abdominal: Soft. Normal appearance and bowel sounds are normal. He exhibits no distension. There is tenderness. There is no rebound and no guarding.    Tender in RUQ and tender in right flank.  Musculoskeletal: Normal range of motion. He exhibits no edema and no tenderness.       Back:  Moves all extremities well.   Neurological: He is alert and oriented to person, place, and time. He has normal strength. No cranial nerve deficit.  Skin: Skin is warm, dry and intact. No rash noted. No erythema. No pallor.  Psychiatric: He has a normal mood and affect. His speech is normal and behavior is normal.  His mood appears not anxious.    ED Course  Procedures (including critical care time)  Medications  0.9 %  sodium chloride infusion (0 mLs Intravenous Stopped 10/21/13 1906)    Followed by  0.9 %  sodium chloride infusion (0 mLs Intravenous Stopped 10/21/13 1906)    Followed by  0.9 %  sodium chloride infusion (1,000 mLs Intravenous New Bag/Given 10/21/13 1927)  morphine 4 MG/ML injection 4 mg (4 mg Intravenous Given 10/21/13 1736)  ondansetron (ZOFRAN) injection 4 mg (4 mg Intravenous Given 10/21/13 1735)  iohexol (OMNIPAQUE) 300 MG/ML solution 50 mL (50 mLs Oral Contrast Given 10/21/13 1757)  iohexol (OMNIPAQUE) 300 MG/ML solution 100 mL (100 mLs Intravenous Contrast Given 10/21/13 1818)  morphine 4 MG/ML injection 4 mg (4 mg Intravenous Given 10/21/13 1927)    DIAGNOSTIC STUDIES: Oxygen Saturation is 98% on room air,  normal by my interpretation.    COORDINATION OF CARE: At 1713 Discussed treatment plan with patient which includes pain medication, antiemetic, and CT scan of abdomen. Patient agrees. We discussed with his very elevated lipase he should expect to be admitted and he is agreeable.   19:46 Dr Marily Memos has seen patient and done his admission orders.   Labs Review Results for orders placed during the hospital encounter of 10/21/13  CBC WITH DIFFERENTIAL      Result Value Ref Range   WBC 11.2 (*) 4.0 - 10.5 K/uL   RBC 5.49  4.22 - 5.81 MIL/uL   Hemoglobin 17.5 (*) 13.0 - 17.0 g/dL   HCT 48.8  39.0 - 52.0 %   MCV 88.9  78.0 - 100.0 fL   MCH 31.9  26.0 - 34.0 pg   MCHC 35.9  30.0 - 36.0 g/dL   RDW 12.9  11.5 - 15.5 %   Platelets 192  150 - 400 K/uL   Neutrophils Relative % 66  43 - 77 %   Neutro Abs 7.3  1.7 - 7.7 K/uL   Lymphocytes Relative 28  12 - 46 %   Lymphs Abs 3.1  0.7 - 4.0 K/uL   Monocytes Relative 5  3 - 12 %   Monocytes Absolute 0.6  0.1 - 1.0 K/uL   Eosinophils Relative 1  0 - 5 %   Eosinophils Absolute 0.1  0.0 - 0.7 K/uL   Basophils Relative 0  0 - 1 %    Basophils Absolute 0.1  0.0 - 0.1 K/uL  COMPREHENSIVE METABOLIC PANEL      Result Value Ref Range   Sodium 139  137 - 147 mEq/L   Potassium 3.9  3.7 - 5.3 mEq/L   Chloride 94 (*) 96 - 112 mEq/L   CO2 27  19 - 32 mEq/L   Glucose, Bld 274 (*) 70 - 99 mg/dL   BUN 12  6 - 23 mg/dL   Creatinine, Ser 0.70  0.50 - 1.35 mg/dL   Calcium 9.8  8.4 - 10.5 mg/dL   Total Protein 8.4 (*) 6.0 - 8.3 g/dL   Albumin 4.7  3.5 - 5.2 g/dL   AST 21  0 - 37 U/L   ALT 36  0 - 53 U/L   Alkaline Phosphatase 97  39 - 117 U/L   Total Bilirubin 0.6  0.3 - 1.2 mg/dL   GFR calc non Af Amer >90  >90 mL/min   GFR calc Af Amer >90  >90 mL/min   Anion gap 18 (*) 5 - 15  LIPASE, BLOOD      Result Value Ref Range   Lipase 1121 (*) 11 - 59 U/L  AMYLASE      Result Value Ref Range   Amylase 412 (*) 0 - 105 U/L  URINALYSIS, ROUTINE W REFLEX MICROSCOPIC      Result Value Ref Range   Color, Urine YELLOW  YELLOW   APPearance CLEAR  CLEAR   Specific Gravity, Urine 1.025  1.005 - 1.030   pH 7.0  5.0 - 8.0   Glucose, UA 500 (*) NEGATIVE mg/dL   Hgb urine dipstick NEGATIVE  NEGATIVE   Bilirubin Urine NEGATIVE  NEGATIVE   Ketones, ur TRACE (*) NEGATIVE mg/dL   Protein, ur 100 (*) NEGATIVE mg/dL   Urobilinogen, UA 0.2  0.0 - 1.0 mg/dL   Nitrite NEGATIVE  NEGATIVE   Leukocytes, UA NEGATIVE  NEGATIVE  URINE MICROSCOPIC-ADD ON  Result Value Ref Range   Squamous Epithelial / LPF RARE  RARE   WBC, UA 0-2  <3 WBC/hpf   Bacteria, UA RARE  RARE   Urine-Other MUCOUS PRESENT    GLUCOSE, CAPILLARY      Result Value Ref Range   Glucose-Capillary 205 (*) 70 - 99 mg/dL   Comment 1 Notify RN     Comment 2 Documented in Chart     Laboratory interpretation all normal except for leukocytosis, concentrated hemoglobin consistent with dehydration, low chloride consistent with dehydration, elevated anion gap, very elevated lipase and amylase  Imaging Review Ct Abdomen Pelvis W Contrast  10/21/2013   CLINICAL DATA:  Acute severe  right upper quadrant pain since yesterday. Elevated lipase with nausea.  EXAM: CT ABDOMEN AND PELVIS WITH CONTRAST  TECHNIQUE: Multidetector CT imaging of the abdomen and pelvis was performed using the standard protocol following bolus administration of intravenous contrast.  CONTRAST:  51mL OMNIPAQUE IOHEXOL 300 MG/ML SOLN, 172mL OMNIPAQUE IOHEXOL 300 MG/ML SOLN  COMPARISON:  08/24/2010.  FINDINGS: Lower chest: Lung bases show no acute findings. Heart size normal. No pericardial or pleural effusion.  Hepatobiliary: Liver may be slightly decreased in attenuation. Question mild marginal irregularity. Stones are seen in the gallbladder. No biliary ductal dilatation.  Pancreas: Question subtle edema and haziness involving the pancreatic head. Pancreas is otherwise unremarkable.  Spleen: Negative.  Adrenals/Urinary Tract: Adrenal glands, kidneys and bladder are unremarkable. Ureters are decompressed.  Stomach/Bowel: Stomach, small bowel, appendix and colon are unremarkable.  Vascular/Lymphatic: Scattered atherosclerotic calcification of the arterial vasculature without abdominal aortic aneurysm. No pathologically enlarged lymph nodes.  Reproductive: Prostate is unremarkable.  Other: No free fluid.  Mesenteries and peritoneum are unremarkable.  Musculoskeletal: Chronic bilateral L5 pars defects without alignment abnormality. Degenerative changes are seen in the spine. No worrisome lytic or sclerotic lesions.  IMPRESSION: 1. Suspect very mild edema and haziness involving the pancreatic head, suggesting acute pancreatitis. 2. Liver appears fatty. Question slight marginal irregularity. Difficult to exclude early/mild cirrhosis. 3. Cholelithiasis.   Electronically Signed   By: Lorin Picket M.D.   On: 10/21/2013 18:46     EKG Interpretation None      MDM   Final diagnoses:  Right upper quadrant pain  Gallstones  Acute pancreatitis, unspecified pancreatitis type  . Plan admission   Rolland Porter, MD,  FACEP   I personally performed the services described in this documentation, which was scribed in my presence. The recorded information has been reviewed and considered.  Rolland Porter, MD, Abram Sander    Janice Norrie, MD 10/22/13 [phone]

## 2013-10-21 NOTE — H&P (Signed)
[facility] History and Physical  [name] GGE:[id number] DOB: [birth year] DOA: 10/21/2013  Referring physician: Dr Rolland Porter PCP: Glo Herring., MD  Specialists: GI  Chief Complaint: RUQ pain  Assessment/Plan Active Problems: Pancreatitis Diabetes HTN GERD Dysphagia   Pancreatitis: Lipase 1121, Amylase 412. WBC 11.2. Likely secondary to gallstones despite nml biliary caliber.  - Admit to med surge - NPO - IVF D5 1/2NS 150 ml/hr w/ 20 KCL  - CMET in am  Diabetes. Glucose 274. A1c 08/25/10 10.2. Home CBG 230-280. On Metformin only.  - SSI - hold home metformin  Cholelithiasis: Pt exam consistent w/ cholecystitis though not mentioned on CT.  - Ab [place] to better evaluate. -If able to wait consider surgical consult prior to DC when pancreatitis improves  Metabolic Acidosis: likely secondary to diabetic ketoacidosis compounded by pancreatitis. GAP 18. Bicarb nml at 27. K 3.9. Ketones in urine.  - Novolog 5 x1  - IVF and SSI as above. Consider glucomander if not improving - BMET at 01:00 - monitor K  HTN: Elevated at time of admission likely seceondary to pain. Improved to nml range after pain medicatinos - Change dilt to IV  - Hydralazine PRN SBP>180 - restart lisinopril once taking PO  GERD:  - IV Protonix  Dysphagia: Pt scheduled for esophageal dilation on 10/28/13.  - bedside swallow - If pt has prolonged stay, consider GI consult for inpt procedure  DVT Prophylaxis: Hep [place] TID  Code Status: FULL Family Communication: none Disposition Plan: Pending improvement  HPI: [name] is a 53 y.o. male came to [facility] ed 10/21/2013 with  RUQ pain on 10/1 when woke up for work. Worse w/ certain positions. No worse w/ food. Getting worse. Hydrocodone w/ only mild benefit. Denies CP, SOB, fever, emesis, diarrhea, constipation. Occasional nausea. Difficulty sleeping last night due pain. Denies tobacco or ETOH abuse. Pts mother adn sisters w/ cholecystectomy.    Appt set for Oct 9th for esophageal dilation.   Review of Systems: Per HPI w/ all other systems negative.   Past Medical History  Diagnosis Date  . Diabetes mellitus   . Arthritis   . Hypertension   . GERD (gastroesophageal reflux disease)   . Headache(784.0)   . Kidney stones    Past Surgical History  Procedure Laterality Date  . Esophagogastroduodenoscopy  08/25/2010    Procedure: ESOPHAGOGASTRODUODENOSCOPY (EGD);  Surgeon: Rogene Houston, MD;  [facility]: [facility];  Service: Endoscopy;  Laterality: N/A;  . Colonoscopy N/A 03/31/2013    Procedure: COLONOSCOPY;  Surgeon: Rogene Houston, MD;  Location: [facility];  Service: Endoscopy;  Laterality: N/A;  730  . Cardiac catheterization  2009   Social History:  History   Social History Narrative  . No narrative on file    No Known Allergies  Family History  Problem Relation Age of Onset  . Diabetes Mother   . Heart failure Mother   . Heart disease Mother   . Heart failure Father      Prior to Admission medications   Medication Sig Start Date End Date Taking? Authorizing Provider  acetaminophen (TYLENOL) 500 MG tablet Take 1,500 mg by mouth every 6 (six) hours as needed for headache.   Yes Historical Provider, MD  ALPRAZolam Duanne Moron) 0.5 MG tablet Take 0.5 mg by mouth at bedtime as needed. FOR  MOOD STABILIZER   Yes Historical Provider, MD  aspirin EC 81 MG tablet Take 81 mg by mouth daily.   Yes Historical Provider,  MD  diltiazem (TIAZAC) 240 MG 24 hr capsule Take 240 mg by mouth daily.   Yes Historical Provider, MD  HYDROcodone-acetaminophen (NORCO) 10-325 MG per tablet Take 1 tablet by mouth every 4 (four) hours as needed for pain.   Yes Historical Provider, MD  lisinopril (PRINIVIL,ZESTRIL) 40 MG tablet Take 40 mg by mouth daily.   Yes Historical Provider, MD  metFORMIN (GLUCOPHAGE) 500 MG tablet Take 1,000-1,500 mg by mouth 2 (two) times daily. Takes three tablets in the morning and and two tablets in the  afternoon   Yes Historical Provider, MD  Multiple Vitamin (MULTIVITAMIN WITH MINERALS) TABS tablet Take 1 tablet by mouth daily.   Yes Historical Provider, MD  omeprazole (PRILOSEC OTC) 20 MG tablet Take 20 mg by mouth every morning. 08/26/10 10/21/13 Yes Annita Brod, MD  OxyCODONE (OXYCONTIN) 10 mg T12A 12 hr tablet Take 10 mg by mouth every 12 (twelve) hours as needed (severe pain).   Yes Historical Provider, MD   Physical Exam: Filed Vitals:   10/21/13 1333 10/21/13 1539  BP: 155/111 142/91  Pulse: 112 113  Temp: 98.8 F (37.1 C)   TempSrc: Oral   Resp:  18  Height: 5\' 9"  (1.753 m)   Weight: 106.595 kg (235 lb)   SpO2: 98% 99%     General:  Mild distress, NAD  Eyes: PERRL, EOMI   ENT: mmm,  Neck: FROM  Cardiovascular: RRR, no m/r/g  Respiratory: Nml WOB. No wheezes, ronchi. Good breath sounds throughout  Abdomen: RUQ ttp - Murphys sign positive, nonttp  Skin: warm, well perfused, intact  Musculoskeletal: No LE edema, moves all extremities spontaneously   Psychiatric: nml affect  Neurologic: CN2-12 Grossly intact, cerebellar fxn nml  Labs on Admission:  Basic Metabolic Panel:  Recent Labs Lab 10/21/13 1352  NA 139  K 3.9  CL 94*  CO2 27  GLUCOSE 274*  BUN 12  CREATININE 0.70  CALCIUM 9.8   Liver Function Tests:  Recent Labs Lab 10/21/13 1352  AST 21  ALT 36  ALKPHOS 97  BILITOT 0.6  PROT 8.4*  ALBUMIN 4.7    Recent Labs Lab 10/21/13 1352  LIPASE 1121*  AMYLASE 412*   No results found for this basename: AMMONIA,  in the last 168 hours CBC:  Recent Labs Lab 10/21/13 1352  WBC 11.2*  NEUTROABS 7.3  HGB 17.5*  HCT 48.8  MCV 88.9  PLT 192   Cardiac Enzymes: No results found for this basename: CKTOTAL, CKMB, CKMBINDEX, TROPONINI,  in the last 168 hours  BNP (last 3 results) No results found for this basename: PROBNP,  in the last 8760 hours CBG: No results found for this basename: GLUCAP,  in the last 168  hours  Radiological Exams on Admission: Ct Abdomen Pelvis W Contrast  10/21/2013   CLINICAL DATA:  Acute severe right upper quadrant pain since yesterday. Elevated lipase with nausea.  EXAM: CT ABDOMEN AND PELVIS WITH CONTRAST  TECHNIQUE: Multidetector CT imaging of the abdomen and pelvis was performed using the standard protocol following bolus administration of intravenous contrast.  CONTRAST:  26mL OMNIPAQUE IOHEXOL 300 MG/ML SOLN, 170mL OMNIPAQUE IOHEXOL 300 MG/ML SOLN  COMPARISON:  08/24/2010.  FINDINGS: Lower chest: Lung bases show no acute findings. Heart size normal. No pericardial or pleural effusion.  Hepatobiliary: Liver may be slightly decreased in attenuation. Question mild marginal irregularity. Stones are seen in the gallbladder. No biliary ductal dilatation.  Pancreas: Question subtle edema and haziness involving the pancreatic head. Pancreas  is otherwise unremarkable.  Spleen: Negative.  Adrenals/Urinary Tract: Adrenal glands, kidneys and bladder are unremarkable. Ureters are decompressed.  Stomach/Bowel: Stomach, small bowel, appendix and colon are unremarkable.  Vascular/Lymphatic: Scattered atherosclerotic calcification of the arterial vasculature without abdominal aortic aneurysm. No pathologically enlarged lymph nodes.  Reproductive: Prostate is unremarkable.  Other: No free fluid.  Mesenteries and peritoneum are unremarkable.  Musculoskeletal: Chronic bilateral L5 pars defects without alignment abnormality. Degenerative changes are seen in the spine. No worrisome lytic or sclerotic lesions.  IMPRESSION: 1. Suspect very mild edema and haziness involving the pancreatic head, suggesting acute pancreatitis. 2. Liver appears fatty. Question slight marginal irregularity. Difficult to exclude early/mild cirrhosis. 3. Cholelithiasis.   Electronically Signed   By: Lorin Picket M.D.   On: 10/21/2013 18:46     Time spent: >70 min in direct pt care and coordination.    MERRELL, Grayling Congress,  MD [facility] [email or web address] Password [email or web address] 10/21/2013, 7:23 PM

## 2013-10-21 NOTE — ED Notes (Signed)
RUQ pain, since yesterday. No n/v/d.    Dr Gerarda Fraction has been seeing him recently to evaluate esophageal pain.  Now pain in RUQ.

## 2013-10-22 ENCOUNTER — Inpatient Hospital Stay (HOSPITAL_COMMUNITY): Payer: 59

## 2013-10-22 DIAGNOSIS — R1011 Right upper quadrant pain: Secondary | ICD-10-CM

## 2013-10-22 DIAGNOSIS — E119 Type 2 diabetes mellitus without complications: Secondary | ICD-10-CM

## 2013-10-22 LAB — CBC WITH DIFFERENTIAL/PLATELET
BASOS PCT: 1 % (ref 0–1)
Basophils Absolute: 0 10*3/uL (ref 0.0–0.1)
EOS ABS: 0.2 10*3/uL (ref 0.0–0.7)
EOS PCT: 2 % (ref 0–5)
HCT: 43.8 % (ref 39.0–52.0)
Hemoglobin: 15.6 g/dL (ref 13.0–17.0)
Lymphocytes Relative: 24 % (ref 12–46)
Lymphs Abs: 2.1 10*3/uL (ref 0.7–4.0)
MCH: 31.8 pg (ref 26.0–34.0)
MCHC: 35.6 g/dL (ref 30.0–36.0)
MCV: 89.2 fL (ref 78.0–100.0)
Monocytes Absolute: 0.7 10*3/uL (ref 0.1–1.0)
Monocytes Relative: 8 % (ref 3–12)
Neutro Abs: 5.8 10*3/uL (ref 1.7–7.7)
Neutrophils Relative %: 65 % (ref 43–77)
PLATELETS: 159 10*3/uL (ref 150–400)
RBC: 4.91 MIL/uL (ref 4.22–5.81)
RDW: 13 % (ref 11.5–15.5)
WBC: 8.7 10*3/uL (ref 4.0–10.5)

## 2013-10-22 LAB — COMPREHENSIVE METABOLIC PANEL
ALT: 30 U/L (ref 0–53)
AST: 20 U/L (ref 0–37)
Albumin: 3.6 g/dL (ref 3.5–5.2)
Alkaline Phosphatase: 71 U/L (ref 39–117)
Anion gap: 13 (ref 5–15)
BUN: 10 mg/dL (ref 6–23)
CALCIUM: 8.5 mg/dL (ref 8.4–10.5)
CHLORIDE: 101 meq/L (ref 96–112)
CO2: 25 mEq/L (ref 19–32)
Creatinine, Ser: 0.64 mg/dL (ref 0.50–1.35)
GFR calc non Af Amer: >90 mL/min (ref >90)
GLUCOSE: 277 mg/dL — AB (ref 70–99)
Potassium: 4.2 mEq/L (ref 3.7–5.3)
SODIUM: 139 meq/L (ref 137–147)
Total Bilirubin: 0.7 mg/dL (ref 0.3–1.2)
Total Protein: 6.8 g/dL (ref 6.0–8.3)

## 2013-10-22 LAB — GLUCOSE, CAPILLARY
GLUCOSE-CAPILLARY: 222 mg/dL — AB (ref 70–99)
GLUCOSE-CAPILLARY: 304 mg/dL — AB (ref 70–99)
GLUCOSE-CAPILLARY: 161 mg/dL — AB (ref 70–99)
Glucose-Capillary: 270 mg/dL — ABNORMAL HIGH (ref 70–99)

## 2013-10-22 LAB — BASIC METABOLIC PANEL
ANION GAP: 14 (ref 5–15)
BUN: 11 mg/dL (ref 6–23)
CALCIUM: 8.4 mg/dL (ref 8.4–10.5)
CHLORIDE: 99 meq/L (ref 96–112)
CO2: 25 mEq/L (ref 19–32)
CREATININE: 0.7 mg/dL (ref 0.50–1.35)
GFR calc Af Amer: >90 mL/min (ref >90)
GFR calc non Af Amer: >90 mL/min (ref >90)
Glucose, Bld: 276 mg/dL — ABNORMAL HIGH (ref 70–99)
Potassium: 3.7 mEq/L (ref 3.7–5.3)
Sodium: 138 mEq/L (ref 137–147)

## 2013-10-22 LAB — HEMOGLOBIN A1C
HEMOGLOBIN A1C: 8.5 % — AB (ref <5.7)
MEAN PLASMA GLUCOSE: 197 mg/dL — AB (ref <117)

## 2013-10-22 LAB — LIPASE, BLOOD: Lipase: 638 U/L — ABNORMAL HIGH (ref 11–59)

## 2013-10-22 MED ORDER — DILTIAZEM HCL ER COATED BEADS 240 MG PO CP24
240.0000 mg | ORAL_CAPSULE | Freq: Every day | ORAL | Status: DC
  Administered 2013-10-23 – 2013-10-25 (×3): 240 mg via ORAL
  Filled 2013-10-22 (×3): qty 1

## 2013-10-22 MED ORDER — ACETAMINOPHEN 325 MG PO TABS
650.0000 mg | ORAL_TABLET | Freq: Four times a day (QID) | ORAL | Status: DC | PRN
  Administered 2013-10-22 – 2013-10-24 (×3): 650 mg via ORAL
  Filled 2013-10-22 (×3): qty 2

## 2013-10-22 MED ORDER — LISINOPRIL 10 MG PO TABS
40.0000 mg | ORAL_TABLET | Freq: Every day | ORAL | Status: DC
  Administered 2013-10-23 – 2013-10-25 (×3): 40 mg via ORAL
  Filled 2013-10-22 (×3): qty 4

## 2013-10-22 MED ORDER — MORPHINE SULFATE 2 MG/ML IJ SOLN
2.0000 mg | INTRAMUSCULAR | Status: DC | PRN
  Administered 2013-10-22 – 2013-10-25 (×10): 2 mg via INTRAVENOUS
  Filled 2013-10-22 (×11): qty 1

## 2013-10-22 NOTE — Progress Notes (Addendum)
  PROGRESS NOTE  [name] VFI:[id number] DOB: [birth year] DOA: 10/21/2013 PCP: Glo Herring., MD  Summary: 53 year old man presented with RUQ pain for several days, not worsened by pain.  Assessment/Plan: 1. RUQ pain with cholelithiasis without evidence of cholecystitis. 2. Acute pancreatitis, suspect gallstone as etiology, no h/o alcohol, but LFTs are normal. Lipase improving, no epigastric pain, no fever or leukocytosis. 3. Metabolic acidosis AG has resolved. DKA was considered on admission, possible/ 4. DM type 2. Hold metformin. 5. H/o dysphagia, scheduled for esophageal dilatation 10/28/13   Pancreatitis seems to be improving, decreased lipase and no epigastric pain. Continues to have RUQ pain without evidence to suggest cholecystitis (normal WBC, afebrile, VSS, imaging)  Check labs in AM, will ask for surgical opinion for consideration of cholecystectomy for biliary colic.  Code Status: full code DVT prophylaxis: heparin Family Communication: family present Disposition Plan: home  Murray Hodgkins, MD  [facility]  Pager [phone] If 7PM-7AM, please contact night-coverage at [email or web address], password [facility] 10/22/2013, 2:12 PM  LOS: 1 day   Consultants:    Procedures:    Antibiotics:    HPI/Subjective: Complains of RUQ pain, no epigastric pain, no n/v.   Objective: Filed Vitals:   10/21/13 2108 10/22/13 0029 10/22/13 0529 10/22/13 0931  BP: 144/111 116/62 127/90 151/97  Pulse: 94  84 85  Temp: 98.3 F (36.8 C)  97.8 F (36.6 C)   TempSrc: Oral     Resp: 24  18   Height: 5\' 9"  (1.753 m)     Weight: 98.884 kg (218 lb)     SpO2: 98%  98%     Intake/Output Summary (Last 24 hours) at 10/22/13 1412 Last data filed at 10/21/13 1906  Gross per 24 hour  Intake   2000 ml  Output      0 ml  Net   2000 ml     Filed Weights   10/21/13 1333 10/21/13 2108  Weight: 106.595 kg (235 lb) 98.884 kg (218 lb)    Exam:     Afebrile, VSS, no  hypoxia  General: appears calm and comfortable  Psych: alert, speech fluent and clear  CV: RRR no m/r/g. No LE edema.  Respiratory: CTA bilaterally no w/r/r. Normal resp effort.  Abdomen: abdomen soft, nd, moderate RUQ pain, no epigastric pain  Data Reviewed:  CBG stable  LFTs unremarkable  Lipase 1121 >> 638  WBC 11.2 >> 8.7  CT abdomen/pelvis suggested fatty liver, pancreatitis; cholithiasis  [place] RUQ with cholelithiasis, no evidenceo f acute cholecystitis. Hepatic steatosis.   Scheduled Meds: . aspirin EC  81 mg Oral Daily  . diltiazem  20 mg Intravenous Q4H  . heparin  5,000 Units Subcutaneous 3 times per day  . insulin aspart  0-5 Units Subcutaneous QHS  . insulin aspart  0-9 Units Subcutaneous TID WC  . insulin aspart  5 Units Intravenous Once  . pantoprazole (PROTONIX) IV  40 mg Intravenous Q24H  . pneumococcal 23 valent vaccine  0.5 mL Intramuscular Tomorrow-1000  . senna  1 tablet Oral BID   Continuous Infusions: . sodium chloride 1,000 mL (10/21/13 1927)  . dextrose 5 % and 0.45 % NaCl with KCl 20 mEq/L 125 mL/hr at 10/22/13 0160    Active Problems:   Pancreatitis   Time spent 20 minutes

## 2013-10-23 DIAGNOSIS — K8050 Calculus of bile duct without cholangitis or cholecystitis without obstruction: Secondary | ICD-10-CM

## 2013-10-23 DIAGNOSIS — K851 Biliary acute pancreatitis without necrosis or infection: Secondary | ICD-10-CM

## 2013-10-23 HISTORY — DX: Calculus of bile duct without cholangitis or cholecystitis without obstruction: K80.50

## 2013-10-23 HISTORY — DX: Biliary acute pancreatitis without necrosis or infection: K85.10

## 2013-10-23 LAB — GLUCOSE, CAPILLARY
GLUCOSE-CAPILLARY: 162 mg/dL — AB (ref 70–99)
Glucose-Capillary: 218 mg/dL — ABNORMAL HIGH (ref 70–99)
Glucose-Capillary: 187 mg/dL — ABNORMAL HIGH (ref 70–99)
Glucose-Capillary: 244 mg/dL — ABNORMAL HIGH (ref 70–99)

## 2013-10-23 LAB — COMPREHENSIVE METABOLIC PANEL
ALBUMIN: 3.6 g/dL (ref 3.5–5.2)
ALT: 32 U/L (ref 0–53)
ANION GAP: 12 (ref 5–15)
AST: 23 U/L (ref 0–37)
Alkaline Phosphatase: 72 U/L (ref 39–117)
BUN: 8 mg/dL (ref 6–23)
CALCIUM: 8.8 mg/dL (ref 8.4–10.5)
CO2: 26 mEq/L (ref 19–32)
CREATININE: 0.74 mg/dL (ref 0.50–1.35)
Chloride: 101 mEq/L (ref 96–112)
GFR calc Af Amer: >90 mL/min (ref >90)
GFR calc non Af Amer: >90 mL/min (ref >90)
Glucose, Bld: 212 mg/dL — ABNORMAL HIGH (ref 70–99)
Potassium: 4.4 mEq/L (ref 3.7–5.3)
Sodium: 139 mEq/L (ref 137–147)
TOTAL PROTEIN: 7.1 g/dL (ref 6.0–8.3)
Total Bilirubin: 0.6 mg/dL (ref 0.3–1.2)

## 2013-10-23 LAB — LIPASE, BLOOD: LIPASE: 110 U/L — AB (ref 11–59)

## 2013-10-23 LAB — SURGICAL PCR SCREEN
MRSA, PCR: NEGATIVE
Staphylococcus aureus: NEGATIVE

## 2013-10-23 MED ORDER — CHLORHEXIDINE GLUCONATE 4 % EX LIQD
1.0000 "application " | Freq: Once | CUTANEOUS | Status: AC
  Administered 2013-10-23: 1 via TOPICAL
  Filled 2013-10-23: qty 15

## 2013-10-23 MED ORDER — INFLUENZA VAC SPLIT QUAD 0.5 ML IM SUSY
0.5000 mL | PREFILLED_SYRINGE | INTRAMUSCULAR | Status: DC
  Filled 2013-10-23: qty 0.5

## 2013-10-23 MED ORDER — NYSTATIN 100000 UNIT/GM EX POWD
Freq: Three times a day (TID) | CUTANEOUS | Status: DC
  Administered 2013-10-23 – 2013-10-25 (×5): via TOPICAL
  Filled 2013-10-23 (×2): qty 15

## 2013-10-23 MED ORDER — SODIUM CHLORIDE 0.9 % IV SOLN
1000.0000 mL | INTRAVENOUS | Status: DC
  Administered 2013-10-23: 1000 mL via INTRAVENOUS

## 2013-10-23 MED ORDER — ENOXAPARIN SODIUM 40 MG/0.4ML ~~LOC~~ SOLN
40.0000 mg | Freq: Every day | SUBCUTANEOUS | Status: DC
  Administered 2013-10-23 – 2013-10-25 (×3): 40 mg via SUBCUTANEOUS
  Filled 2013-10-23 (×3): qty 0.4

## 2013-10-23 NOTE — Consult Note (Signed)
Reason for Consult: Gallstone pancreatitis Referring Physician: Hospitalist  [name] is an 53 y.o. male.  HPI: Patient is a 53 year old white male who presented to the hospital with worsening upper abdominal pain. He was found to have pancreatitis. Ultrasound the gallbladder was performed which revealed cholelithiasis. His abdominal pain has been improving. This is his first episode of upper abdominal pain. He has had a history of reflux and has undergone EGD and colonoscopy by Dr. Laural Golden in the past. No fever, chills, or jaundice.  Past Medical History  Diagnosis Date  . Diabetes mellitus   . Arthritis   . Hypertension   . GERD (gastroesophageal reflux disease)   . Headache(784.0)   . Kidney stones     Past Surgical History  Procedure Laterality Date  . Esophagogastroduodenoscopy  08/25/2010    Procedure: ESOPHAGOGASTRODUODENOSCOPY (EGD);  Surgeon: Rogene Houston, MD;  [facility]: [facility];  Service: Endoscopy;  Laterality: N/A;  . Colonoscopy N/A 03/31/2013    Procedure: COLONOSCOPY;  Surgeon: Rogene Houston, MD;  Location: [facility];  Service: Endoscopy;  Laterality: N/A;  730  . Cardiac catheterization  2009    Family History  Problem Relation Age of Onset  . Diabetes Mother   . Heart failure Mother   . Heart disease Mother   . Heart failure Father     Social History:  reports that he has quit smoking. His smoking use included Cigarettes. He smoked 0.00 packs per day. He does not have any smokeless tobacco history on file. He reports that he does not drink alcohol or use illicit drugs.  Allergies: No Known Allergies  Medications: I have reviewed the patient's current medications.  Results for orders placed during the hospital encounter of 10/21/13 (from the past 48 hour(s))  CBC WITH DIFFERENTIAL     Status: Abnormal   Collection Time    10/21/13  1:52 PM      Result Value Ref Range   WBC 11.2 (*) 4.0 - 10.5 K/uL   RBC 5.49  4.22 - 5.81 MIL/uL    Hemoglobin 17.5 (*) 13.0 - 17.0 g/dL   HCT 48.8  39.0 - 52.0 %   MCV 88.9  78.0 - 100.0 fL   MCH 31.9  26.0 - 34.0 pg   MCHC 35.9  30.0 - 36.0 g/dL   RDW 12.9  11.5 - 15.5 %   Platelets 192  150 - 400 K/uL   Neutrophils Relative % 66  43 - 77 %   Neutro Abs 7.3  1.7 - 7.7 K/uL   Lymphocytes Relative 28  12 - 46 %   Lymphs Abs 3.1  0.7 - 4.0 K/uL   Monocytes Relative 5  3 - 12 %   Monocytes Absolute 0.6  0.1 - 1.0 K/uL   Eosinophils Relative 1  0 - 5 %   Eosinophils Absolute 0.1  0.0 - 0.7 K/uL   Basophils Relative 0  0 - 1 %   Basophils Absolute 0.1  0.0 - 0.1 K/uL  COMPREHENSIVE METABOLIC PANEL     Status: Abnormal   Collection Time    10/21/13  1:52 PM      Result Value Ref Range   Sodium 139  137 - 147 mEq/L   Potassium 3.9  3.7 - 5.3 mEq/L   Chloride 94 (*) 96 - 112 mEq/L   CO2 27  19 - 32 mEq/L   Glucose, Bld 274 (*) 70 - 99 mg/dL   BUN 12  6 - 23 mg/dL   Creatinine, Ser 0.70  0.50 - 1.35 mg/dL   Calcium 9.8  8.4 - 10.5 mg/dL   Total Protein 8.4 (*) 6.0 - 8.3 g/dL   Albumin 4.7  3.5 - 5.2 g/dL   AST 21  0 - 37 U/L   ALT 36  0 - 53 U/L   Alkaline Phosphatase 97  39 - 117 U/L   Total Bilirubin 0.6  0.3 - 1.2 mg/dL   GFR calc non Af Amer >90  >90 mL/min   GFR calc Af Amer >90  >90 mL/min   Comment: (NOTE)     The eGFR has been calculated using the CKD EPI equation.     This calculation has not been validated in all clinical situations.     eGFR's persistently <90 mL/min signify possible Chronic Kidney     Disease.   Anion gap 18 (*) 5 - 15  LIPASE, BLOOD     Status: Abnormal   Collection Time    10/21/13  1:52 PM      Result Value Ref Range   Lipase 1121 (*) 11 - 59 U/L  AMYLASE     Status: Abnormal   Collection Time    10/21/13  1:52 PM      Result Value Ref Range   Amylase 412 (*) 0 - 105 U/L  URINALYSIS, ROUTINE W REFLEX MICROSCOPIC     Status: Abnormal   Collection Time    10/21/13  5:30 PM      Result Value Ref Range   Color, Urine YELLOW  YELLOW    APPearance CLEAR  CLEAR   Specific Gravity, Urine 1.025  1.005 - 1.030   pH 7.0  5.0 - 8.0   Glucose, UA 500 (*) NEGATIVE mg/dL   Hgb urine dipstick NEGATIVE  NEGATIVE   Bilirubin Urine NEGATIVE  NEGATIVE   Ketones, ur TRACE (*) NEGATIVE mg/dL   Protein, ur 100 (*) NEGATIVE mg/dL   Urobilinogen, UA 0.2  0.0 - 1.0 mg/dL   Nitrite NEGATIVE  NEGATIVE   Leukocytes, UA NEGATIVE  NEGATIVE  URINE MICROSCOPIC-ADD ON     Status: None   Collection Time    10/21/13  5:30 PM      Result Value Ref Range   Squamous Epithelial / LPF RARE  RARE   WBC, UA 0-2  <3 WBC/hpf   Bacteria, UA RARE  RARE   Urine-Other MUCOUS PRESENT    GLUCOSE, CAPILLARY     Status: Abnormal   Collection Time    10/21/13  9:05 PM      Result Value Ref Range   Glucose-Capillary 205 (*) 70 - 99 mg/dL   Comment 1 Notify RN     Comment 2 Documented in Chart    BASIC METABOLIC PANEL     Status: Abnormal   Collection Time    10/22/13 12:19 AM      Result Value Ref Range   Sodium 138  137 - 147 mEq/L   Potassium 3.7  3.7 - 5.3 mEq/L   Chloride 99  96 - 112 mEq/L   CO2 25  19 - 32 mEq/L   Glucose, Bld 276 (*) 70 - 99 mg/dL   BUN 11  6 - 23 mg/dL   Creatinine, Ser 0.70  0.50 - 1.35 mg/dL   Calcium 8.4  8.4 - 10.5 mg/dL   GFR calc non Af Amer >90  >90 mL/min   GFR calc Af Amer >90  >90 mL/min  Comment: (NOTE)     The eGFR has been calculated using the CKD EPI equation.     This calculation has not been validated in all clinical situations.     eGFR's persistently <90 mL/min signify possible Chronic Kidney     Disease.   Anion gap 14  5 - 15  LIPASE, BLOOD     Status: Abnormal   Collection Time    10/22/13  6:37 AM      Result Value Ref Range   Lipase 638 (*) 11 - 59 U/L  COMPREHENSIVE METABOLIC PANEL     Status: Abnormal   Collection Time    10/22/13  6:39 AM      Result Value Ref Range   Sodium 139  137 - 147 mEq/L   Potassium 4.2  3.7 - 5.3 mEq/L   Chloride 101  96 - 112 mEq/L   CO2 25  19 - 32 mEq/L    Glucose, Bld 277 (*) 70 - 99 mg/dL   BUN 10  6 - 23 mg/dL   Creatinine, Ser 0.64  0.50 - 1.35 mg/dL   Calcium 8.5  8.4 - 10.5 mg/dL   Total Protein 6.8  6.0 - 8.3 g/dL   Albumin 3.6  3.5 - 5.2 g/dL   AST 20  0 - 37 U/L   ALT 30  0 - 53 U/L   Alkaline Phosphatase 71  39 - 117 U/L   Total Bilirubin 0.7  0.3 - 1.2 mg/dL   GFR calc non Af Amer >90  >90 mL/min   GFR calc Af Amer >90  >90 mL/min   Comment: (NOTE)     The eGFR has been calculated using the CKD EPI equation.     This calculation has not been validated in all clinical situations.     eGFR's persistently <90 mL/min signify possible Chronic Kidney     Disease.   Anion gap 13  5 - 15  CBC WITH DIFFERENTIAL     Status: None   Collection Time    10/22/13  6:39 AM      Result Value Ref Range   WBC 8.7  4.0 - 10.5 K/uL   RBC 4.91  4.22 - 5.81 MIL/uL   Hemoglobin 15.6  13.0 - 17.0 g/dL   HCT 43.8  39.0 - 52.0 %   MCV 89.2  78.0 - 100.0 fL   MCH 31.8  26.0 - 34.0 pg   MCHC 35.6  30.0 - 36.0 g/dL   RDW 13.0  11.5 - 15.5 %   Platelets 159  150 - 400 K/uL   Neutrophils Relative % 65  43 - 77 %   Neutro Abs 5.8  1.7 - 7.7 K/uL   Lymphocytes Relative 24  12 - 46 %   Lymphs Abs 2.1  0.7 - 4.0 K/uL   Monocytes Relative 8  3 - 12 %   Monocytes Absolute 0.7  0.1 - 1.0 K/uL   Eosinophils Relative 2  0 - 5 %   Eosinophils Absolute 0.2  0.0 - 0.7 K/uL   Basophils Relative 1  0 - 1 %   Basophils Absolute 0.0  0.0 - 0.1 K/uL  HEMOGLOBIN A1C     Status: Abnormal   Collection Time    10/22/13  6:39 AM      Result Value Ref Range   Hemoglobin A1C 8.5 (*) <5.7 %   Comment: (NOTE)  According to the ADA Clinical Practice Recommendations for 2011, when     HbA1c is used as a screening test:      >=6.5%   Diagnostic of Diabetes Mellitus               (if abnormal result is confirmed)     5.7-6.4%   Increased risk of developing Diabetes Mellitus      References:Diagnosis and Classification of Diabetes Mellitus,Diabetes     TXHF,4142,39(RVUYE 1):S62-S69 and Standards of Medical Care in             Diabetes - 2011,Diabetes BXID,5686,16 (Suppl 1):S11-S61.   Mean Plasma Glucose 197 (*) <117 mg/dL   Comment: Performed at [place], CAPILLARY     Status: Abnormal   Collection Time    10/22/13  7:30 AM      Result Value Ref Range   Glucose-Capillary 270 (*) 70 - 99 mg/dL   Comment 1 Notify RN    GLUCOSE, CAPILLARY     Status: Abnormal   Collection Time    10/22/13 11:32 AM      Result Value Ref Range   Glucose-Capillary 222 (*) 70 - 99 mg/dL   Comment 1 Notify RN    GLUCOSE, CAPILLARY     Status: Abnormal   Collection Time    10/22/13  4:18 PM      Result Value Ref Range   Glucose-Capillary 304 (*) 70 - 99 mg/dL   Comment 1 Notify RN    GLUCOSE, CAPILLARY     Status: Abnormal   Collection Time    10/22/13  9:13 PM      Result Value Ref Range   Glucose-Capillary 161 (*) 70 - 99 mg/dL   Comment 1 Documented in Chart     Comment 2 Notify RN    LIPASE, BLOOD     Status: Abnormal   Collection Time    10/23/13  6:11 AM      Result Value Ref Range   Lipase 110 (*) 11 - 59 U/L  COMPREHENSIVE METABOLIC PANEL     Status: Abnormal   Collection Time    10/23/13  6:11 AM      Result Value Ref Range   Sodium 139  137 - 147 mEq/L   Potassium 4.4  3.7 - 5.3 mEq/L   Chloride 101  96 - 112 mEq/L   CO2 26  19 - 32 mEq/L   Glucose, Bld 212 (*) 70 - 99 mg/dL   BUN 8  6 - 23 mg/dL   Creatinine, Ser 0.74  0.50 - 1.35 mg/dL   Calcium 8.8  8.4 - 10.5 mg/dL   Total Protein 7.1  6.0 - 8.3 g/dL   Albumin 3.6  3.5 - 5.2 g/dL   AST 23  0 - 37 U/L   ALT 32  0 - 53 U/L   Alkaline Phosphatase 72  39 - 117 U/L   Total Bilirubin 0.6  0.3 - 1.2 mg/dL   GFR calc non Af Amer >90  >90 mL/min   GFR calc Af Amer >90  >90 mL/min   Comment: (NOTE)     The eGFR has been calculated using the CKD EPI equation.     This calculation has not  been validated in all clinical situations.     eGFR's persistently <90 mL/min signify possible Chronic Kidney     Disease.   Anion gap 12  5 - 15  GLUCOSE, CAPILLARY  Status: Abnormal   Collection Time    10/23/13  7:32 AM      Result Value Ref Range   Glucose-Capillary 218 (*) 70 - 99 mg/dL   Comment 1 Notify RN      Ct Abdomen Pelvis W Contrast  10/21/2013   CLINICAL DATA:  Acute severe right upper quadrant pain since yesterday. Elevated lipase with nausea.  EXAM: CT ABDOMEN AND PELVIS WITH CONTRAST  TECHNIQUE: Multidetector CT imaging of the abdomen and pelvis was performed using the standard protocol following bolus administration of intravenous contrast.  CONTRAST:  47m OMNIPAQUE IOHEXOL 300 MG/ML SOLN, 1040mOMNIPAQUE IOHEXOL 300 MG/ML SOLN  COMPARISON:  08/24/2010.  FINDINGS: Lower chest: Lung bases show no acute findings. Heart size normal. No pericardial or pleural effusion.  Hepatobiliary: Liver may be slightly decreased in attenuation. Question mild marginal irregularity. Stones are seen in the gallbladder. No biliary ductal dilatation.  Pancreas: Question subtle edema and haziness involving the pancreatic head. Pancreas is otherwise unremarkable.  Spleen: Negative.  Adrenals/Urinary Tract: Adrenal glands, kidneys and bladder are unremarkable. Ureters are decompressed.  Stomach/Bowel: Stomach, small bowel, appendix and colon are unremarkable.  Vascular/Lymphatic: Scattered atherosclerotic calcification of the arterial vasculature without abdominal aortic aneurysm. No pathologically enlarged lymph nodes.  Reproductive: Prostate is unremarkable.  Other: No free fluid.  Mesenteries and peritoneum are unremarkable.  Musculoskeletal: Chronic bilateral L5 pars defects without alignment abnormality. Degenerative changes are seen in the spine. No worrisome lytic or sclerotic lesions.  IMPRESSION: 1. Suspect very mild edema and haziness involving the pancreatic head, suggesting acute pancreatitis.  2. Liver appears fatty. Question slight marginal irregularity. Difficult to exclude early/mild cirrhosis. 3. Cholelithiasis.   Electronically Signed   By: MeLorin Picket.D.   On: 10/21/2013 18:46   UsKoreabdomen Limited Ruq  10/22/2013   CLINICAL DATA:  Right upper quadrant pain for 2 days with nausea. Cholelithiasis.  EXAM: US[place]oreaBDOMEN LIMITED - RIGHT UPPER QUADRANT  COMPARISON:  CT abdomen and pelvis 10/21/2013  FINDINGS: Gallbladder:  Multiple gallstones were seen measuring up to approximately 1 cm in size. Gallbladder wall was not thickened, measuring 2.3 mm. Sonographic Murphy's sign was negative. Gallbladder sludge also noted.  Common bile duct:  Diameter: 6 mm  Liver:  Diffusely increased echogenicity and mildly enlarged, measuring 18.8 cm in length. No focal abnormality identified.  IMPRESSION: 1. Cholelithiasis without evidence of acute cholecystitis. No biliary dilatation. 2. Hepatic steatosis.   Electronically Signed   By: AlLogan Bores On: 10/22/2013 10:18    ROS: See chart Blood pressure 161/93, pulse 87, temperature 98 F (36.7 C), temperature source Oral, resp. rate 20, height 5' 9" (1.753 m), weight 98.884 kg (218 lb), SpO2 99.00%. Physical Exam: Pleasant white male no acute distress. Abdomen is soft with some mild tenderness to palpation of the right upper quadrant. No hepatosplenomegaly, masses, or hernias identified. No rigidity is noted.  Assessment/Plan: Impression: Gallstone pancreatitis, clinically pancreatitis is improving. Enzyme tests are decreasing. Liver enzyme tests are within normal limits. Plan: We'll take patient to the operating room tomorrow for laparoscopic cholecystectomy with possible cholangiograms. The risks and benefits of the procedure including bleeding, infection, hepatobiliary injury, and the possibility of an open procedure were fully explained to the patient, who gave informed consent.  Mattheus Rauls A 10/23/2013, 10:09 AM

## 2013-10-23 NOTE — Progress Notes (Signed)
  PROGRESS NOTE  [name] NAT:[id number] DOB: 07-20-1960 DOA: 10/21/2013 PCP: Glo Herring., MD  Summary: 53 year old man presented with RUQ pain for several days, not worsened by pain.  Assessment/Plan: 1. RUQ pain with cholelithiasis without evidence of cholecystitis. Biliary colic. Stable.  2. Acute pancreatitis, suspect gallstone as etiology, no h/o alcohol, but LFTs are normal. Lipase continues to improve. 3. DM type 2. Stable. Continue SSI. Hold metformin. 4. H/o dysphagia, scheduled for esophageal dilatation 10/28/13   Lipase improving, no symptoms of pancreatitis. Plan as below for bilary colic, suspected gallstone pancreatitis.  Lap chole in AM per surgery   Code Status: full code DVT prophylaxis: heparin Family Communication: family present Disposition Plan: home  Murray Hodgkins, MD  [facility]  Pager [phone] If 7PM-7AM, please contact night-coverage at [email or web address], password [facility] 10/23/2013, 11:09 AM  LOS: 2 days   Consultants:  General surgery  Procedures:    Antibiotics:    HPI/Subjective: Surgery plans on lap chole in AM.  Feeling better, no vomiting but still has RUQ pain.  Objective: Filed Vitals:   10/22/13 1412 10/22/13 1439 10/22/13 2354 10/23/13 0604  BP: 162/99 144/83 148/82 161/93  Pulse: 90 87 92 87  Temp:  98.4 F (36.9 C) 98.1 F (36.7 C) 98 F (36.7 C)  TempSrc:  Oral Oral Oral  Resp:  20 20 20   Height:      Weight:      SpO2:  99% 98% 99%    Intake/Output Summary (Last 24 hours) at 10/23/13 1109 Last data filed at 10/23/13 0930  Gross per 24 hour  Intake    480 ml  Output      0 ml  Net    480 ml     Filed Weights   10/21/13 1333 10/21/13 2108  Weight: 106.595 kg (235 lb) 98.884 kg (218 lb)    Exam:     Afebrile, VSS, no hypoxia  Alert, appears calm and comfortable sitting in chair  CV RRR no m/r/g.   Resp CTA bilaterally no w/r/r. Normal resp effort.  Abd soft, obese, no epigastric  pain. Mild RUQ tenderness.  Data Reviewed:  CBG stable.  Lipase down to 110  LFTS remain normal  Scheduled Meds: . diltiazem  240 mg Oral Daily  . enoxaparin (LOVENOX) injection  40 mg Subcutaneous Daily  . [START ON 10/24/2013] Influenza vac split quadrivalent PF  0.5 mL Intramuscular Tomorrow-1000  . insulin aspart  0-5 Units Subcutaneous QHS  . insulin aspart  0-9 Units Subcutaneous TID WC  . insulin aspart  5 Units Intravenous Once  . lisinopril  40 mg Oral Daily  . pantoprazole (PROTONIX) IV  40 mg Intravenous Q24H  . pneumococcal 23 valent vaccine  0.5 mL Intramuscular Tomorrow-1000  . senna  1 tablet Oral BID   Continuous Infusions: . sodium chloride      Principal Problem:   Acute biliary pancreatitis Active Problems:   DM type 2 (diabetes mellitus, type 2)   Abdominal pain, right upper quadrant   Biliary colic   Time spent 15 minutes

## 2013-10-24 ENCOUNTER — Encounter (HOSPITAL_COMMUNITY): Payer: 59 | Admitting: Anesthesiology

## 2013-10-24 ENCOUNTER — Encounter (HOSPITAL_COMMUNITY)
Admission: EM | Disposition: A | Discharge status: Home / Self Care | Payer: Self-pay | Source: Home / Self Care | Attending: Family Medicine

## 2013-10-24 ENCOUNTER — Inpatient Hospital Stay (HOSPITAL_COMMUNITY): Payer: 59

## 2013-10-24 ENCOUNTER — Inpatient Hospital Stay (HOSPITAL_COMMUNITY): Payer: 59 | Admitting: Anesthesiology

## 2013-10-24 ENCOUNTER — Encounter (HOSPITAL_COMMUNITY): Payer: Self-pay | Admitting: *Deleted

## 2013-10-24 HISTORY — PX: CHOLECYSTECTOMY: SHX55

## 2013-10-24 LAB — GLUCOSE, CAPILLARY
GLUCOSE-CAPILLARY: 196 mg/dL — AB (ref 70–99)
GLUCOSE-CAPILLARY: 243 mg/dL — AB (ref 70–99)
GLUCOSE-CAPILLARY: 253 mg/dL — AB (ref 70–99)
Glucose-Capillary: 190 mg/dL — ABNORMAL HIGH (ref 70–99)
Glucose-Capillary: 248 mg/dL — ABNORMAL HIGH (ref 70–99)
Glucose-Capillary: 195 mg/dL — ABNORMAL HIGH (ref 70–99)

## 2013-10-24 SURGERY — LAPAROSCOPIC CHOLECYSTECTOMY
Anesthesia: General

## 2013-10-24 MED ORDER — ROCURONIUM BROMIDE 50 MG/5ML IV SOLN
INTRAVENOUS | Status: AC
  Filled 2013-10-24: qty 1

## 2013-10-24 MED ORDER — CIPROFLOXACIN IN D5W 400 MG/200ML IV SOLN
INTRAVENOUS | Status: AC
  Filled 2013-10-24: qty 200

## 2013-10-24 MED ORDER — POVIDONE-IODINE 10 % OINT PACKET
TOPICAL_OINTMENT | CUTANEOUS | Status: DC | PRN
  Administered 2013-10-24: 1 via TOPICAL

## 2013-10-24 MED ORDER — HYDROMORPHONE HCL 1 MG/ML IJ SOLN
INTRAMUSCULAR | Status: AC
  Filled 2013-10-24: qty 1

## 2013-10-24 MED ORDER — LABETALOL HCL 5 MG/ML IV SOLN
10.0000 mg | INTRAVENOUS | Status: AC | PRN
  Administered 2013-10-24 (×2): 10 mg via INTRAVENOUS

## 2013-10-24 MED ORDER — LACTATED RINGERS IV SOLN
INTRAVENOUS | Status: DC
  Administered 2013-10-24 – 2013-10-25 (×2): via INTRAVENOUS

## 2013-10-24 MED ORDER — SIMETHICONE 80 MG PO CHEW
80.0000 mg | CHEWABLE_TABLET | Freq: Four times a day (QID) | ORAL | Status: DC | PRN
  Administered 2013-10-24: 80 mg via ORAL
  Filled 2013-10-24: qty 1

## 2013-10-24 MED ORDER — ONDANSETRON HCL 4 MG/2ML IJ SOLN: 4.0000 mg | Freq: Once | INTRAMUSCULAR | Status: DC | PRN

## 2013-10-24 MED ORDER — HYDROMORPHONE HCL 1 MG/ML IJ SOLN
1.0000 mg | INTRAMUSCULAR | Status: DC | PRN
  Administered 2013-10-24 (×3): 1 mg via INTRAVENOUS
  Filled 2013-10-24 (×2): qty 1

## 2013-10-24 MED ORDER — BUPIVACAINE HCL (PF) 0.5 % IJ SOLN
INTRAMUSCULAR | Status: DC | PRN
  Administered 2013-10-24: 10 mL

## 2013-10-24 MED ORDER — LABETALOL HCL 5 MG/ML IV SOLN
INTRAVENOUS | Status: AC
  Filled 2013-10-24: qty 4

## 2013-10-24 MED ORDER — LIDOCAINE HCL (CARDIAC) 10 MG/ML IV SOLN
INTRAVENOUS | Status: DC | PRN
  Administered 2013-10-24: 20 mg via INTRAVENOUS

## 2013-10-24 MED ORDER — CIPROFLOXACIN IN D5W 400 MG/200ML IV SOLN
400.0000 mg | INTRAVENOUS | Status: AC
  Administered 2013-10-24: 400 mg via INTRAVENOUS

## 2013-10-24 MED ORDER — POVIDONE-IODINE 10 % EX OINT
TOPICAL_OINTMENT | CUTANEOUS | Status: AC
Start: 2013-10-24 — End: 2013-10-24
  Filled 2013-10-24: qty 1

## 2013-10-24 MED ORDER — LIDOCAINE HCL (PF) 1 % IJ SOLN
INTRAMUSCULAR | Status: AC
  Filled 2013-10-24: qty 5

## 2013-10-24 MED ORDER — ONDANSETRON HCL 4 MG PO TABS: 4.0000 mg | ORAL_TABLET | Freq: Four times a day (QID) | ORAL | Status: DC | PRN

## 2013-10-24 MED ORDER — PROPOFOL 10 MG/ML IV BOLUS
INTRAVENOUS | Status: DC | PRN
  Administered 2013-10-24: 200 mg via INTRAVENOUS

## 2013-10-24 MED ORDER — FENTANYL CITRATE 0.05 MG/ML IJ SOLN
INTRAMUSCULAR | Status: DC | PRN
  Administered 2013-10-24 (×4): 50 ug via INTRAVENOUS
  Administered 2013-10-24: 25 ug via INTRAVENOUS
  Administered 2013-10-24: 100 ug via INTRAVENOUS
  Administered 2013-10-24: 25 ug via INTRAVENOUS

## 2013-10-24 MED ORDER — FENTANYL CITRATE 0.05 MG/ML IJ SOLN
INTRAMUSCULAR | Status: AC
  Filled 2013-10-24: qty 5

## 2013-10-24 MED ORDER — HYDROMORPHONE HCL 1 MG/ML IJ SOLN
0.5000 mg | INTRAMUSCULAR | Status: DC | PRN
  Administered 2013-10-24 (×7): 0.5 mg via INTRAVENOUS
  Filled 2013-10-24 (×3): qty 1

## 2013-10-24 MED ORDER — INFLUENZA VAC SPLIT QUAD 0.5 ML IM SUSY
0.5000 mL | PREFILLED_SYRINGE | Freq: Once | INTRAMUSCULAR | Status: AC
  Administered 2013-10-24: 0.5 mL via INTRAMUSCULAR
  Filled 2013-10-24: qty 0.5

## 2013-10-24 MED ORDER — LABETALOL HCL 5 MG/ML IV SOLN
INTRAVENOUS | Status: DC | PRN
  Administered 2013-10-24 (×2): 5 mg via INTRAVENOUS

## 2013-10-24 MED ORDER — LIDOCAINE HCL (PF) 1 % IJ SOLN
INTRAMUSCULAR | Status: AC
Start: 2013-10-24 — End: 2013-10-24
  Filled 2013-10-24: qty 5

## 2013-10-24 MED ORDER — HEMOSTATIC AGENTS (NO CHARGE) OPTIME
TOPICAL | Status: DC | PRN
  Administered 2013-10-24: 1 via TOPICAL

## 2013-10-24 MED ORDER — 0.9 % SODIUM CHLORIDE (POUR BTL) OPTIME
TOPICAL | Status: DC | PRN
  Administered 2013-10-24: 1000 mL

## 2013-10-24 MED ORDER — LACTATED RINGERS IV SOLN
INTRAVENOUS | Status: DC
  Administered 2013-10-24: 1000 mL via INTRAVENOUS

## 2013-10-24 MED ORDER — MIDAZOLAM HCL 2 MG/2ML IJ SOLN
1.0000 mg | INTRAMUSCULAR | Status: DC | PRN
  Administered 2013-10-24: 2 mg via INTRAVENOUS

## 2013-10-24 MED ORDER — KETOROLAC TROMETHAMINE 30 MG/ML IJ SOLN
30.0000 mg | Freq: Once | INTRAMUSCULAR | Status: AC
  Administered 2013-10-24: 30 mg via INTRAVENOUS
  Filled 2013-10-24: qty 1

## 2013-10-24 MED ORDER — GLYCOPYRROLATE 0.2 MG/ML IJ SOLN
INTRAMUSCULAR | Status: AC
  Filled 2013-10-24: qty 1

## 2013-10-24 MED ORDER — ONDANSETRON HCL 4 MG/2ML IJ SOLN: 4.0000 mg | Freq: Four times a day (QID) | INTRAMUSCULAR | Status: DC | PRN

## 2013-10-24 MED ORDER — GLYCOPYRROLATE 0.2 MG/ML IJ SOLN
INTRAMUSCULAR | Status: DC | PRN
  Administered 2013-10-24: 0.6 mg via INTRAVENOUS

## 2013-10-24 MED ORDER — PNEUMOCOCCAL VAC POLYVALENT 25 MCG/0.5ML IJ INJ
0.5000 mL | INJECTION | Freq: Once | INTRAMUSCULAR | Status: AC
  Administered 2013-10-24: 0.5 mL via INTRAMUSCULAR
  Filled 2013-10-24: qty 0.5

## 2013-10-24 MED ORDER — GLYCOPYRROLATE 0.2 MG/ML IJ SOLN
INTRAMUSCULAR | Status: AC
  Filled 2013-10-24: qty 5

## 2013-10-24 MED ORDER — SUCCINYLCHOLINE CHLORIDE 20 MG/ML IJ SOLN
INTRAMUSCULAR | Status: AC
  Filled 2013-10-24: qty 1

## 2013-10-24 MED ORDER — ROCURONIUM BROMIDE 100 MG/10ML IV SOLN
INTRAVENOUS | Status: DC | PRN
  Administered 2013-10-24: 25 mg via INTRAVENOUS
  Administered 2013-10-24 (×2): 5 mg via INTRAVENOUS

## 2013-10-24 MED ORDER — NEOSTIGMINE METHYLSULFATE 10 MG/10ML IV SOLN
INTRAVENOUS | Status: DC | PRN
  Administered 2013-10-24: 3 mg via INTRAVENOUS

## 2013-10-24 MED ORDER — FENTANYL CITRATE 0.05 MG/ML IJ SOLN: 25.0000 ug | INTRAMUSCULAR | Status: DC | PRN

## 2013-10-24 MED ORDER — ONDANSETRON HCL 4 MG/2ML IJ SOLN
4.0000 mg | Freq: Once | INTRAMUSCULAR | Status: AC
  Administered 2013-10-24: 4 mg via INTRAVENOUS

## 2013-10-24 MED ORDER — SUCCINYLCHOLINE CHLORIDE 20 MG/ML IJ SOLN
INTRAMUSCULAR | Status: DC | PRN
  Administered 2013-10-24: 140 mg via INTRAVENOUS

## 2013-10-24 MED ORDER — MIDAZOLAM HCL 2 MG/2ML IJ SOLN
INTRAMUSCULAR | Status: AC
  Filled 2013-10-24: qty 2

## 2013-10-24 MED ORDER — FENTANYL CITRATE 0.05 MG/ML IJ SOLN
INTRAMUSCULAR | Status: AC
  Filled 2013-10-24: qty 2

## 2013-10-24 MED ORDER — OXYCODONE-ACETAMINOPHEN 5-325 MG PO TABS
1.0000 | ORAL_TABLET | ORAL | Status: DC | PRN
  Administered 2013-10-24 – 2013-10-25 (×3): 2 via ORAL
  Filled 2013-10-24 (×3): qty 2

## 2013-10-24 MED ORDER — BUPIVACAINE HCL (PF) 0.5 % IJ SOLN
INTRAMUSCULAR | Status: AC
  Filled 2013-10-24: qty 30

## 2013-10-24 MED ORDER — LABETALOL HCL 5 MG/ML IV SOLN
10.0000 mg | INTRAVENOUS | Status: DC | PRN
  Administered 2013-10-24: 10 mg via INTRAVENOUS
  Filled 2013-10-24: qty 4

## 2013-10-24 MED ORDER — GLYCOPYRROLATE 0.2 MG/ML IJ SOLN
0.2000 mg | Freq: Once | INTRAMUSCULAR | Status: AC
  Administered 2013-10-24: 0.2 mg via INTRAVENOUS

## 2013-10-24 MED ORDER — ONDANSETRON HCL 4 MG/2ML IJ SOLN
INTRAMUSCULAR | Status: AC
  Filled 2013-10-24: qty 2

## 2013-10-24 MED ORDER — PROPOFOL 10 MG/ML IV EMUL
INTRAVENOUS | Status: AC
  Filled 2013-10-24: qty 20

## 2013-10-24 SURGICAL SUPPLY — 42 items
APPLIER CLIP LAPSCP 10X32 DD (CLIP) ×2 IMPLANT
BAG HAMPER (MISCELLANEOUS) ×2 IMPLANT
CATH CHOLANGIOGRAM 4.5FR (CATHETERS) ×2 IMPLANT
CLOTH BEACON ORANGE TIMEOUT ST (SAFETY) ×2 IMPLANT
COVER LIGHT HANDLE STERIS (MISCELLANEOUS) ×4 IMPLANT
COVER MAYO STAND XLG (DRAPE) ×2 IMPLANT
DECANTER SPIKE VIAL GLASS SM (MISCELLANEOUS) ×2 IMPLANT
DISSECTOR BLUNT TIP ENDO 5MM (MISCELLANEOUS) IMPLANT
DRAPE C-ARM FOLDED MOBILE STRL (DRAPES) ×2 IMPLANT
DURAPREP 26ML APPLICATOR (WOUND CARE) ×2 IMPLANT
ELECT REM PT RETURN 9FT ADLT (ELECTROSURGICAL) ×2
ELECTRODE REM PT RTRN 9FT ADLT (ELECTROSURGICAL) ×1 IMPLANT
FILTER SMOKE EVAC LAPAROSHD (FILTER) ×2 IMPLANT
FORMALIN 10 PREFIL 120ML (MISCELLANEOUS) ×2 IMPLANT
GLOVE SURG SS PI 7.5 STRL IVOR (GLOVE) ×4 IMPLANT
GOWN STRL REUS W/ TWL XL LVL3 (GOWN DISPOSABLE) ×1 IMPLANT
GOWN STRL REUS W/TWL LRG LVL3 (GOWN DISPOSABLE) ×6 IMPLANT
GOWN STRL REUS W/TWL XL LVL3 (GOWN DISPOSABLE) ×1
HEMOSTAT SNOW SURGICEL 2X4 (HEMOSTASIS) ×2 IMPLANT
INST SET LAPROSCOPIC AP (KITS) ×2 IMPLANT
KIT ROOM TURNOVER APOR (KITS) ×2 IMPLANT
MANIFOLD NEPTUNE II (INSTRUMENTS) ×2 IMPLANT
NEEDLE INSUFFLATION 120MM (ENDOMECHANICALS) ×2 IMPLANT
NS IRRIG 1000ML POUR BTL (IV SOLUTION) ×2 IMPLANT
PACK LAP CHOLE LZT030E (CUSTOM PROCEDURE TRAY) ×2 IMPLANT
PAD ARMBOARD 7.5X6 YLW CONV (MISCELLANEOUS) ×2 IMPLANT
POUCH SPECIMEN RETRIEVAL 10MM (ENDOMECHANICALS) ×2 IMPLANT
SET BASIN LINEN APH (SET/KITS/TRAYS/PACK) ×2 IMPLANT
SET TUBE IRRIG SUCTION NO TIP (IRRIGATION / IRRIGATOR) IMPLANT
SLEEVE ENDOPATH XCEL 5M (ENDOMECHANICALS) ×2 IMPLANT
SPONGE GAUZE 2X2 8PLY STRL LF (GAUZE/BANDAGES/DRESSINGS) ×8 IMPLANT
STAPLER VISISTAT (STAPLE) ×2 IMPLANT
SUT VICRYL 0 UR6 27IN ABS (SUTURE) ×2 IMPLANT
SYR 20CC LL (SYRINGE) ×2 IMPLANT
SYR 30ML LL (SYRINGE) ×2 IMPLANT
TAPE CLOTH SURG 4X10 WHT LF (GAUZE/BANDAGES/DRESSINGS) ×2 IMPLANT
TROCAR ENDO BLADELESS 11MM (ENDOMECHANICALS) ×2 IMPLANT
TROCAR XCEL NON-BLD 5MMX100MML (ENDOMECHANICALS) ×2 IMPLANT
TROCAR XCEL UNIV SLVE 11M 100M (ENDOMECHANICALS) ×2 IMPLANT
TUBING INSUFFLATION (TUBING) ×2 IMPLANT
WARMER LAPAROSCOPE (MISCELLANEOUS) ×2 IMPLANT
YANKAUER SUCT 12FT TUBE ARGYLE (SUCTIONS) ×2 IMPLANT

## 2013-10-24 NOTE — Anesthesia Procedure Notes (Signed)
Procedure Name: Intubation Date/Time: 10/24/2013 8:57 AM Performed by: Vista Deck Pre-anesthesia Checklist: Patient identified, Patient being monitored, Timeout performed, Emergency Drugs available and Suction available Patient Re-evaluated:Patient Re-evaluated prior to inductionOxygen Delivery Method: Circle System Utilized Preoxygenation: Pre-oxygenation with 100% oxygen Intubation Type: IV induction and Cricoid Pressure applied Ventilation: Oral airway inserted - appropriate to patient size Laryngoscope Size: Sabra Heck and 2 Grade View: Grade I Tube type: Oral Tube size: 7.0 mm Number of attempts: 1 Airway Equipment and Method: stylet Placement Confirmation: ETT inserted through vocal cords under direct vision,  positive ETCO2 and breath sounds checked- equal and bilateral Secured at: 22 cm Tube secured with: Tape Dental Injury: Teeth and Oropharynx as per pre-operative assessment

## 2013-10-24 NOTE — Care Management Note (Signed)
    Page 1 of 1   10/24/2013     1:32:33 PM CARE MANAGEMENT NOTE 10/24/2013  Patient:  JORIEL, STREETY   Account Number:  [id number]  Date Initiated:  10/24/2013  Documentation initiated by:  Theophilus Kinds  Subjective/Objective Assessment:   Pt admitted from home with pancreatitis. Pt lives with his girlfriend and will return home at discharge. Pt is independent with ADl's.     Action/Plan:   S/p lap choley on 10/24/13. No CM needs anticipated.   Anticipated DC Date:  10/25/2013   Anticipated DC Plan:  [place]  CM consult      Choice offered to / List presented to:             Status of service:  Completed, signed off Medicare Important Message given?   (If response is "NO", the following Medicare IM given date fields will be blank) Date Medicare IM given:   Medicare IM given by:   Date Additional Medicare IM given:   Additional Medicare IM given by:    Discharge Disposition:  HOME/SELF CARE  Per UR Regulation:    If discussed at Long Length of Stay Meetings, dates discussed:    Comments:  10/24/13 [place], RN BSN CM

## 2013-10-24 NOTE — Anesthesia Postprocedure Evaluation (Signed)
  Anesthesia Post-op Note  Patient: [name]  Procedure(s) Performed: Procedure(s): LAPAROSCOPIC CHOLECYSTECTOMY (N/A)  Patient Location: PACU  Anesthesia Type:General  Level of Consciousness: sedated and patient cooperative  Airway and Oxygen Therapy: Patient Spontanous Breathing and non-rebreather face mask  Post-op Pain: 5/10  Post-op Assessment: Post-op Vital signs reviewed, Patient's Cardiovascular Status Stable, Respiratory Function Stable and Patent Airway  Post-op Vital Signs: Reviewed and stable    Complications: No apparent anesthesia complications

## 2013-10-24 NOTE — Progress Notes (Signed)
UR chart review completed.  

## 2013-10-24 NOTE — Anesthesia Preprocedure Evaluation (Signed)
Anesthesia Evaluation  Patient identified by MRN, date of birth, ID band Patient awake    Reviewed: Allergy & Precautions, H&P , NPO status , Patient's Chart, lab work & pertinent test results  Airway Mallampati: II TM Distance: >3 FB Neck ROM: Full    Dental  (+) Teeth Intact   Pulmonary neg pulmonary ROS, former smoker,  breath sounds clear to auscultation        Cardiovascular hypertension, Pt. on medications Rhythm:Regular Rate:Normal     Neuro/Psych  Headaches,    GI/Hepatic PUD, GERD-  Medicated,  Endo/Other  diabetes, Type 2, Oral Hypoglycemic Agents  Renal/GU Renal disease     Musculoskeletal  (+) Arthritis -,   Abdominal   Peds  Hematology   Anesthesia Other Findings   Reproductive/Obstetrics                           Anesthesia Physical Anesthesia Plan  ASA: III  Anesthesia Plan: General   Post-op Pain Management:    Induction: Intravenous, Rapid sequence and Cricoid pressure planned  Airway Management Planned: Oral ETT  Additional Equipment:   Intra-op Plan:   Post-operative Plan: Extubation in OR  Informed Consent: I have reviewed the patients History and Physical, chart, labs and discussed the procedure including the risks, benefits and alternatives for the proposed anesthesia with the patient or authorized representative who has indicated his/her understanding and acceptance.     Plan Discussed with:   Anesthesia Plan Comments:         Anesthesia Quick Evaluation

## 2013-10-24 NOTE — Progress Notes (Signed)
  PROGRESS NOTE  [name] LFY:[id number] DOB: Sep 15, 1960 DOA: 10/21/2013 PCP: Glo Herring., MD  Summary: 53 year old man presented with RUQ pain for several days, not worsened by pain. Admitted by acute pancreatitis thought gallstone in nature. persistant RUQ with rapidly resolving lipase, suspect biliary colic/symptomatic cholelithiasis, therefore underwent lap chole 10/5. Likely home 10/6.  Assessment/Plan: 1. RUQ pain with cholelithiasis without evidence of cholecystitis. Biliary colic. Now s/p lap chole. 2. Acute pancreatitis, suspect gallstone as etiology, no h/o alcohol, but LFTs are normal. Lipase improving. 3. DM type 2. Stable. Continue SSI. Hold metformin. 4. H/o dysphagia, scheduled for esophageal dilatation 10/28/13   Doing well post op lap chole. Lipase trending down. Plan advance diet, check labs in AM, likely home 10/6.  Code Status: full code DVT prophylaxis: Lovenox Family Communication: family present Disposition Plan: home  Murray Hodgkins, MD  [facility]  Pager [phone] If 7PM-7AM, please contact night-coverage at [email or web address], password [facility] 10/24/2013, 4:39 PM  LOS: 3 days   Consultants:  General surgery  Procedures:    Antibiotics:    HPI/Subjective: S/p lap chole. Feeling much better.  Objective: Filed Vitals:   10/24/13 1100 10/24/13 1115 10/24/13 1135 10/24/13 1411  BP: 167/105  141/79 141/94  Pulse: 84 82 85 90  Temp:   98.2 F (36.8 C) 98.2 F (36.8 C)  TempSrc:    Oral  Resp: 17 13 16 18   Height:      Weight:      SpO2: 95% 94% 95% 94%    Intake/Output Summary (Last 24 hours) at 10/24/13 1639 Last data filed at 10/24/13 0932  Gross per 24 hour  Intake    920 ml  Output      0 ml  Net    920 ml     Filed Weights   10/21/13 1333 10/21/13 2108  Weight: 106.595 kg (235 lb) 98.884 kg (218 lb)    Exam:     Afebrile, VSS, no hypoxia  Appears calm, comfortable  Alert, speech fluent and clear  Resp  CTA bilaterally no w/r/r. Normal resp effort  CV RRR no m/r/g.   Data Reviewed:  CBG stable.   Scheduled Meds: . diltiazem  240 mg Oral Daily  . enoxaparin (LOVENOX) injection  40 mg Subcutaneous Daily  . insulin aspart  0-5 Units Subcutaneous QHS  . insulin aspart  0-9 Units Subcutaneous TID WC  . insulin aspart  5 Units Intravenous Once  . lisinopril  40 mg Oral Daily  . nystatin   Topical TID  . pantoprazole (PROTONIX) IV  40 mg Intravenous Q24H  . senna  1 tablet Oral BID   Continuous Infusions: . lactated ringers 75 mL/hr at 10/24/13 1418    Principal Problem:   Acute biliary pancreatitis Active Problems:   DM type 2 (diabetes mellitus, type 2)   Abdominal pain, right upper quadrant   Biliary colic   Time spent 15 minutes

## 2013-10-24 NOTE — Anesthesia Postprocedure Evaluation (Signed)
  Anesthesia Post-op Note  Patient: [name]  Procedure(s) Performed: Procedure(s): LAPAROSCOPIC CHOLECYSTECTOMY (N/A)  Patient Location: PACU  Anesthesia Type:General  Level of Consciousness: sedated and patient cooperative  Airway and Oxygen Therapy: Patient Spontanous Breathing and non-rebreather face mask  Post-op Pain: moderate  Post-op Assessment: Respiratory Function Stable, Patent Airway and No signs of Nausea or vomiting  Post-op Vital Signs: Reviewed and stable    Complications: No apparent anesthesia complications

## 2013-10-24 NOTE — Progress Notes (Signed)
Pt has ambulated twice today, approximately 250 feet each time. Tolerated well. No complaints.

## 2013-10-24 NOTE — Transfer of Care (Signed)
Immediate Anesthesia Transfer of Care Note  Patient: [name]  Procedure(s) Performed: Procedure(s): LAPAROSCOPIC CHOLECYSTECTOMY (N/A)  Patient Location: PACU  Anesthesia Type:General  Level of Consciousness: sedated and patient cooperative  Airway & Oxygen Therapy: Patient Spontanous Breathing and non-rebreather face mask  Post-op Assessment: Report given to PACU RN, Post -op Vital signs reviewed and stable and Patient moving all extremities  Post vital signs: Reviewed and stable  Complications: No apparent anesthesia complications

## 2013-10-24 NOTE — Progress Notes (Signed)
Inpatient Diabetes Program Recommendations  AACE/ADA: New Consensus Statement on Inpatient Glycemic Control (2013)  Target Ranges:  Prepandial:   less than 140 mg/dL      Peak postprandial:   less than 180 mg/dL (1-2 hours)      Critically ill patients:  140 - 180 mg/dL   Results for BRANSTON, HALSTED (MRN [id number]) as of 10/24/2013 13:49  Ref. Range 10/23/2013 07:32 10/23/2013 11:37 10/23/2013 16:22 10/23/2013 21:24 10/24/2013 07:00 10/24/2013 08:01 10/24/2013 10:56 10/24/2013 12:30  Glucose-Capillary Latest Range: 70-99 mg/dL 218 (H) 187 (H) 244 (H) 162 (H) 196 (H) 190 (H) 248 (H) 243 (H)   Diabetes history: DM2 Outpatient Diabetes medications: Metformin 1500 mg QAM, Metformin 1000 mg QPM Current orders for Inpatient glycemic control: Novolog 0-9 units AC, Novolog 0-5 units HS  Inpatient Diabetes Program Recommendations Insulin - Basal: May want to consider ordering low dose basal insulin to improve inpatient glycemic control. Correction (SSI): Please consider increasing Novolog correction to moderate scale ACHS.  Thanks, Barnie Alderman, RN, MSN, CCRN Diabetes Coordinator Inpatient Diabetes Program [phone] (Team Pager) [phone] ([facility] office) [phone] [facility] office)

## 2013-10-24 NOTE — Discharge Instructions (Signed)

## 2013-10-24 NOTE — Op Note (Signed)
Patient:  [name]  DOB:  02/26/1960  MRN:  [id number]   Preop Diagnosis:  Gallstone pancreatitis  Postop Diagnosis:  Same  Procedure:  Laparoscopic cholecystectomy  Surgeon:  Aviva Signs, M.D.  Anes:  General endotracheal  Indications:  Patient is a 53 year old white male who presents with gallstone pancreatitis. Ultrasound reveals cholelithiasis. Liver enzyme tests have returned to normal levels. Patient now comes to the operating room for laparoscopic cholecystectomy with possible cholangiograms. Risks and benefits of the procedure including bleeding, infection, hepatobiliary injury, and the possibility of an open procedure were fully explained to the patient, who gave informed consent.  Procedure note:  The patient is placed the supine position. After induction of general endotracheal anesthesia, the abdomen was prepped and draped using usual sterile technique with DuraPrep. Surgical site confirmation was performed.  A supraumbilical incision was made down to the fascia. A Veress needle was introduced into the abdominal cavity and confirmation of placement was done using the saline drop test. The abdomen was then insufflated to 16 mm mercury pressure. An 11 mm trocar was introduced into the abdominal cavity under direct visualization without difficulty. The patient was placed in reverse Trendelenburg position and additional 11 mm trocar was placed the epigastric region and 5 mm trochars were placed the right upper quadrant and right flank regions. Liver was inspected and noted within normal limits. The gallbladder was retracted in a dynamic fashion in order to expose the triangle of Calot. The cystic duct was first identified. Its junction to the infundibulum was fully identified. He was noted to be extremely small, thus this precluded an attempt at cholangiogram. Endoclips placed proximally and distally on the cystic duct, and the cystic duct was divided. This was likewise done  cystic artery. The gallbladder was freed away from the gallbladder fossa using Bovie electrocautery. The gallbladder delivered through the epigastric trocar site using an Endo Catch bag. The gallbladder fossa was inspected and no abnormal bleeding or bile leakage was noted. Surgicel is placed the gallbladder fossa. All fluid and air were then evacuated from the abdominal cavity prior to removal of the trochars.  All wounds were irrigated with normal saline. All wounds were injected with 0.5% Sensorcaine. The supraumbilical fascia as well as epigastric fascia reapproximated using 0 Vicryl interrupted sutures. All skin incisions were closed using staples. Betadine ointment and dry sterile dressings were applied.  All tape and needle counts were correct the end of the procedure. Patient was extubated in the operating room and transferred to PACU in stable condition.  Complications:  None  EBL:  Minimal  Specimen:  Gallbladder

## 2013-10-25 LAB — GLUCOSE, CAPILLARY: Glucose-Capillary: 251 mg/dL — ABNORMAL HIGH (ref 70–99)

## 2013-10-25 MED ORDER — OXYCODONE HCL ER 10 MG PO T12A: 10.0000 mg | EXTENDED_RELEASE_TABLET | Freq: Two times a day (BID) | ORAL | Status: DC | PRN

## 2013-10-25 MED ORDER — HYDROCODONE-ACETAMINOPHEN 10-325 MG PO TABS: 1.0000 | ORAL_TABLET | ORAL | Status: DC | PRN

## 2013-10-25 NOTE — Progress Notes (Signed)
[facility] PROGRESS NOTE  TRAYVON Reilly DZH:[id number] DOB: [birth year] DOA: 10/21/2013 PCP: Glo Herring., MD   Assessment/Plan: Biliary colic Patient presented with right upper quadrant pain with cholelithiasis without cholecystitis. Underwent laparoscopy cholecystectomy on 10/5. Tolerated well. Was able to take solids today. Feels sore but no nausea or vomiting.  Patient discharged by surgery. Pain medications and outpatient followup provided  Acute Biliary pancreatitis Mild. Lipase improved. LFTs normal.  Type 2 diabetes mellitus Resume metformin.  Hypertension Continue lisinopril and Cardizem.  Patient will follow up with his PCP and Dr. Arnoldo Morale as outpatient.   Code Status: Full code Family Communication: Family at bedside Disposition Plan: Patient discharged home today   Consultants:  Surgery (Dr. Arnoldo Morale)  Procedures:  Laparoscopy cholecystectomy on 10/5  Antibiotics:  None  HPI/Subjective: Patient reports some abdominal soreness but denies any nausea or vomiting. Bowel movement this morning and tolerated diet  Objective: Filed Vitals:   10/25/13 0634  BP: 115/57  Pulse: 96  Temp: 98 F (36.7 C)  Resp: 18    Intake/Output Summary (Last 24 hours) at 10/25/13 1121 Last data filed at 10/25/13 0516  Gross per 24 hour  Intake 1323.75 ml  Output      1 ml  Net 1322.75 ml   Filed Weights   10/21/13 1333 10/21/13 2108  Weight: 106.595 kg (235 lb) 98.884 kg (218 lb)    Exam:   General:  Middle aged obese male in no acute distress  HEENT: Moist oral mucosa  Cardiovascular: S1 and S2, no murmurs  Respiratory: Clear Bilaterally  Abdomen: Dressing over her laparoscopy site clean, minimal tenderness, BS+  Musculoskeletal: Warm, no edema   Data Reviewed: Basic Metabolic Panel:  Recent Labs Lab 10/21/13 1352 10/22/13 0019 10/22/13 0639 10/23/13 0611  NA 139 138 139 139  K 3.9 3.7 4.2 4.4  CL 94* 99 101 101  CO2 27 25 25  26   GLUCOSE 274* 276* 277* 212*  BUN 12 11 10 8   CREATININE 0.70 0.70 0.64 0.74  CALCIUM 9.8 8.4 8.5 8.8   Liver Function Tests:  Recent Labs Lab 10/21/13 1352 10/22/13 0639 10/23/13 0611  AST 21 20 23   ALT 36 30 32  ALKPHOS 97 71 72  BILITOT 0.6 0.7 0.6  PROT 8.4* 6.8 7.1  ALBUMIN 4.7 3.6 3.6    Recent Labs Lab 10/21/13 1352 10/22/13 0637 10/23/13 0611  LIPASE 1121* 638* 110*  AMYLASE 412*  --   --    No results found for this basename: AMMONIA,  in the last 168 hours CBC:  Recent Labs Lab 10/21/13 1352 10/22/13 0639  WBC 11.2* 8.7  NEUTROABS 7.3 5.8  HGB 17.5* 15.6  HCT 48.8 43.8  MCV 88.9 89.2  PLT 192 159   Cardiac Enzymes: No results found for this basename: CKTOTAL, CKMB, CKMBINDEX, TROPONINI,  in the last 168 hours BNP (last 3 results) No results found for this basename: PROBNP,  in the last 8760 hours CBG:  Recent Labs Lab 10/24/13 1056 10/24/13 1230 10/24/13 1726 10/24/13 2035 10/25/13 0725  GLUCAP 248* 243* 195* 253* 251*    Recent Results (from the past 240 hour(s))  SURGICAL PCR SCREEN     Status: None   Collection Time    10/23/13  2:37 PM      Result Value Ref Range Status   MRSA, PCR NEGATIVE  NEGATIVE Final   Staphylococcus aureus NEGATIVE  NEGATIVE Final   Comment:  The Xpert SA Assay (FDA     approved for NASAL specimens     in patients over 40 years of age),     is one component of     a comprehensive surveillance     program.  Test performance has     been validated by [employer] for patients greater     than or equal to 16 year old.     It is not intended     to diagnose infection nor to     guide or monitor treatment.     Studies: No results found.  Scheduled Meds: . diltiazem  240 mg Oral Daily  . enoxaparin (LOVENOX) injection  40 mg Subcutaneous Daily  . insulin aspart  0-5 Units Subcutaneous QHS  . insulin aspart  0-9 Units Subcutaneous TID WC  . insulin aspart  5 Units Intravenous Once   . lisinopril  40 mg Oral Daily  . nystatin   Topical TID  . pantoprazole (PROTONIX) IV  40 mg Intravenous Q24H  . senna  1 tablet Oral BID   Continuous Infusions: . lactated ringers 75 mL/hr at 10/25/13 0457     Time spent: 25 minutes    Latori Beggs, [facility] Pager [phone]. If 7PM-7AM, please contact night-coverage at [email or web address], password [facility] 10/25/2013, 11:21 AM  LOS: 4 days

## 2013-10-25 NOTE — Discharge Summary (Signed)
Physician Discharge Summary  Patient ID: [name] MRN: [id number] DOB/AGE: [birth year] 53 y.o.  Admit date: 10/21/2013 Discharge date: 10/25/2013  Admission Diagnoses: Gallstone pancreatitis  Discharge Diagnoses: Same Principal Problem:   Acute biliary pancreatitis Active Problems:   DM type 2 (diabetes mellitus, type 2)   Abdominal pain, right upper quadrant   Biliary colic   Discharged Condition: good  Hospital Course: Patient is a 53 year old white male who presented emergency room with worsening upper abdominal pain. He was admitted to the hospital for further evaluation treatment. He was found to have gallstone pancreatitis. Surgery was consulted and the patient ultimately underwent a laparoscopic cholecystectomy on 10/24/2013. Tolerated the procedure well. His postoperative course has been unremarkable. His diet was advanced at difficulty. The patient is being discharged home in good improving condition.  Treatments: surgery: Laparoscopic cholecystectomy on 10/24/2013  Discharge Exam: Blood pressure 115/57, pulse 96, temperature 98 F (36.7 C), temperature source Oral, resp. rate 18, height 5\' 9"  (1.753 m), weight 98.884 kg (218 lb), SpO2 96.00%. General appearance: alert, cooperative and no distress Resp: clear to auscultation bilaterally Cardio: regular rate and rhythm, S1, S2 normal, no murmur, click, rub or gallop GI: Soft, dressings dry and intact.  Disposition: 01-Home or Self Care     Medication List    TAKE these medications       acetaminophen 500 MG tablet  Commonly known as:  TYLENOL  Take 1,500 mg by mouth every 6 (six) hours as needed for headache.     ALPRAZolam 0.5 MG tablet  Commonly known as:  XANAX  Take 0.5 mg by mouth at bedtime as needed. FOR  MOOD STABILIZER     aspirin EC 81 MG tablet  Take 81 mg by mouth daily.     diltiazem 240 MG 24 hr capsule  Commonly known as:  TIAZAC  Take 240 mg by mouth daily.     HYDROcodone-acetaminophen 10-325 MG per tablet  Commonly known as:  NORCO  Take 1 tablet by mouth every 4 (four) hours as needed.     lisinopril 40 MG tablet  Commonly known as:  PRINIVIL,ZESTRIL  Take 40 mg by mouth daily.     metFORMIN 500 MG tablet  Commonly known as:  GLUCOPHAGE  Take 1,000-1,500 mg by mouth 2 (two) times daily. Takes three tablets in the morning and and two tablets in the afternoon     multivitamin with minerals Tabs tablet  Take 1 tablet by mouth daily.     OxyCODONE 10 mg T12a 12 hr tablet  Commonly known as:  OXYCONTIN  Take 1 tablet (10 mg total) by mouth every 12 (twelve) hours as needed (severe pain).      ASK your doctor about these medications       omeprazole 20 MG tablet  Commonly known as:  PRILOSEC OTC  Take 20 mg by mouth every morning.           Follow-up Information   Follow up with Jamesetta So, MD. Schedule an appointment as soon as possible for a visit on 11/01/2013.   Specialty:  General Surgery   Contact information:   [address] [address] [phone]       Signed: Aviva Signs A 10/25/2013, 8:18 AM

## 2013-10-27 ENCOUNTER — Encounter (INDEPENDENT_AMBULATORY_CARE_PROVIDER_SITE_OTHER): Payer: Self-pay | Admitting: *Deleted

## 2013-10-27 ENCOUNTER — Encounter (HOSPITAL_COMMUNITY): Payer: Self-pay | Admitting: General Surgery

## 2013-10-28 ENCOUNTER — Ambulatory Visit (HOSPITAL_COMMUNITY)
Admission: RE | Admit: 2013-10-28 | Discharge: 2013-10-28 | Disposition: A | Discharge status: Home / Self Care | Payer: 59 | Source: Ambulatory Visit | Attending: Internal Medicine | Admitting: Internal Medicine

## 2013-10-28 DIAGNOSIS — R1310 Dysphagia, unspecified: Secondary | ICD-10-CM | POA: Insufficient documentation

## 2013-10-28 DIAGNOSIS — K219 Gastro-esophageal reflux disease without esophagitis: Secondary | ICD-10-CM | POA: Diagnosis not present

## 2013-10-28 DIAGNOSIS — I10 Essential (primary) hypertension: Secondary | ICD-10-CM | POA: Diagnosis not present

## 2013-10-28 DIAGNOSIS — R0789 Other chest pain: Secondary | ICD-10-CM | POA: Insufficient documentation

## 2013-10-28 DIAGNOSIS — E119 Type 2 diabetes mellitus without complications: Secondary | ICD-10-CM | POA: Insufficient documentation

## 2013-11-01 ENCOUNTER — Ambulatory Visit (INDEPENDENT_AMBULATORY_CARE_PROVIDER_SITE_OTHER): Payer: 59 | Admitting: Internal Medicine

## 2013-11-08 ENCOUNTER — Telehealth (INDEPENDENT_AMBULATORY_CARE_PROVIDER_SITE_OTHER): Payer: Self-pay | Admitting: *Deleted

## 2013-11-08 ENCOUNTER — Encounter (INDEPENDENT_AMBULATORY_CARE_PROVIDER_SITE_OTHER): Payer: Self-pay | Admitting: *Deleted

## 2013-11-08 NOTE — Telephone Encounter (Signed)
[name] SHOWED for his apt with Deberah Castle, NP on 11/01/13. A NS letter has been mailed.

## 2014-02-02 ENCOUNTER — Encounter (HOSPITAL_COMMUNITY): Payer: Self-pay | Admitting: Internal Medicine

## 2014-06-12 ENCOUNTER — Emergency Department (HOSPITAL_COMMUNITY)
Admission: EM | Admit: 2014-06-12 | Discharge: 2014-06-12 | Disposition: A | Discharge status: Home / Self Care | Payer: 59 | Attending: Emergency Medicine | Admitting: Emergency Medicine

## 2014-06-12 ENCOUNTER — Emergency Department (HOSPITAL_COMMUNITY): Payer: 59

## 2014-06-12 ENCOUNTER — Encounter (HOSPITAL_COMMUNITY): Payer: Self-pay | Admitting: *Deleted

## 2014-06-12 DIAGNOSIS — E876 Hypokalemia: Secondary | ICD-10-CM | POA: Diagnosis not present

## 2014-06-12 DIAGNOSIS — I10 Essential (primary) hypertension: Secondary | ICD-10-CM | POA: Insufficient documentation

## 2014-06-12 DIAGNOSIS — Z7982 Long term (current) use of aspirin: Secondary | ICD-10-CM | POA: Diagnosis not present

## 2014-06-12 DIAGNOSIS — Z9889 Other specified postprocedural states: Secondary | ICD-10-CM | POA: Diagnosis not present

## 2014-06-12 DIAGNOSIS — R109 Unspecified abdominal pain: Secondary | ICD-10-CM | POA: Diagnosis not present

## 2014-06-12 DIAGNOSIS — K219 Gastro-esophageal reflux disease without esophagitis: Secondary | ICD-10-CM | POA: Insufficient documentation

## 2014-06-12 DIAGNOSIS — Z79899 Other long term (current) drug therapy: Secondary | ICD-10-CM | POA: Diagnosis not present

## 2014-06-12 DIAGNOSIS — E119 Type 2 diabetes mellitus without complications: Secondary | ICD-10-CM | POA: Insufficient documentation

## 2014-06-12 DIAGNOSIS — Z87891 Personal history of nicotine dependence: Secondary | ICD-10-CM | POA: Insufficient documentation

## 2014-06-12 DIAGNOSIS — Z87442 Personal history of urinary calculi: Secondary | ICD-10-CM | POA: Diagnosis not present

## 2014-06-12 DIAGNOSIS — M1990 Unspecified osteoarthritis, unspecified site: Secondary | ICD-10-CM | POA: Diagnosis not present

## 2014-06-12 LAB — COMPREHENSIVE METABOLIC PANEL
ALT: 25 U/L (ref 17–63)
ANION GAP: 10 (ref 5–15)
AST: 22 U/L (ref 15–41)
Albumin: 4.1 g/dL (ref 3.5–5.0)
Alkaline Phosphatase: 55 U/L (ref 38–126)
BILIRUBIN TOTAL: 0.7 mg/dL (ref 0.3–1.2)
BUN: 14 mg/dL (ref 6–20)
CO2: 25 mmol/L (ref 22–32)
CREATININE: 0.7 mg/dL (ref 0.61–1.24)
Calcium: 8.8 mg/dL — ABNORMAL LOW (ref 8.9–10.3)
Chloride: 105 mmol/L (ref 101–111)
GLUCOSE: 145 mg/dL — AB (ref 65–99)
POTASSIUM: 3.3 mmol/L — AB (ref 3.5–5.1)
Sodium: 140 mmol/L (ref 135–145)
Total Protein: 6.9 g/dL (ref 6.5–8.1)

## 2014-06-12 LAB — CBC WITH DIFFERENTIAL/PLATELET
BASOS ABS: 0 10*3/uL (ref 0.0–0.1)
Basophils Relative: 1 % (ref 0–1)
EOS PCT: 2 % (ref 0–5)
Eosinophils Absolute: 0.1 10*3/uL (ref 0.0–0.7)
HCT: 42.5 % (ref 39.0–52.0)
HEMOGLOBIN: 14.9 g/dL (ref 13.0–17.0)
Lymphocytes Relative: 39 % (ref 12–46)
Lymphs Abs: 2.7 10*3/uL (ref 0.7–4.0)
MCH: 31.3 pg (ref 26.0–34.0)
MCHC: 35.1 g/dL (ref 30.0–36.0)
MCV: 89.3 fL (ref 78.0–100.0)
Monocytes Absolute: 0.4 10*3/uL (ref 0.1–1.0)
Monocytes Relative: 6 % (ref 3–12)
NEUTROS PCT: 52 % (ref 43–77)
Neutro Abs: 3.7 10*3/uL (ref 1.7–7.7)
PLATELETS: 176 10*3/uL (ref 150–400)
RBC: 4.76 MIL/uL (ref 4.22–5.81)
RDW: 13.2 % (ref 11.5–15.5)
WBC: 6.9 10*3/uL (ref 4.0–10.5)

## 2014-06-12 LAB — URINE MICROSCOPIC-ADD ON

## 2014-06-12 LAB — URINALYSIS, ROUTINE W REFLEX MICROSCOPIC
BILIRUBIN URINE: NEGATIVE
GLUCOSE, UA: NEGATIVE mg/dL
HGB URINE DIPSTICK: NEGATIVE
Leukocytes, UA: NEGATIVE
Nitrite: NEGATIVE
PROTEIN: 100 mg/dL — AB
Specific Gravity, Urine: >1.03 — ABNORMAL HIGH (ref 1.005–1.030)
Urobilinogen, UA: 0.2 mg/dL (ref 0.0–1.0)
pH: 5.5 (ref 5.0–8.0)

## 2014-06-12 MED ORDER — KETOROLAC TROMETHAMINE 30 MG/ML IJ SOLN
30.0000 mg | Freq: Once | INTRAMUSCULAR | Status: AC
  Administered 2014-06-12: 30 mg via INTRAVENOUS
  Filled 2014-06-12: qty 1

## 2014-06-12 MED ORDER — IBUPROFEN 800 MG PO TABS: 800.0000 mg | ORAL_TABLET | Freq: Three times a day (TID) | ORAL | Status: DC

## 2014-06-12 MED ORDER — POTASSIUM CHLORIDE CRYS ER 20 MEQ PO TBCR: 20.0000 meq | EXTENDED_RELEASE_TABLET | Freq: Two times a day (BID) | ORAL | Status: DC

## 2014-06-12 MED ORDER — ONDANSETRON HCL 4 MG/2ML IJ SOLN
4.0000 mg | Freq: Once | INTRAMUSCULAR | Status: AC
  Administered 2014-06-12: 4 mg via INTRAVENOUS
  Filled 2014-06-12: qty 2

## 2014-06-12 NOTE — Discharge Instructions (Signed)

## 2014-06-12 NOTE — ED Provider Notes (Signed)
CSN: [id number]     Arrival date & time 06/12/14  1246 History   First MD Initiated Contact with Patient 06/12/14 1259     Chief Complaint  Patient presents with  . Abdominal Pain     (Consider location/radiation/quality/duration/timing/severity/associated sxs/prior Treatment) HPI Comments: The patient is a 54 year old male, history of diabetes hypertension and kidney stones, underwent cholecystectomy in October 2015. He presents after developing right-sided abdominal pain radiating into his right groin that started yesterday. He reports that this was gradual in onset, the pain has been persistent, it is associated with nausea but no vomiting, denies hematuria or changes in bowel habits including constipation diarrhea or blood in his stools. He feels that this is somewhat similar to prior kidney stones. There is no positional changes that make this any better or worse. He did take an OxyContin 10 mg tablet this morning that he uses for chronic back pain, he states that this did help somewhat with his pain.  Denies fevers chills vomiting diarrhea, back pain, chest pain, shortness of breath, headache, weakness, numbness, rashes, swelling  Patient is a 54 y.o. male presenting with abdominal pain. The history is provided by the patient.  Abdominal Pain   Past Medical History  Diagnosis Date  . Diabetes mellitus   . Arthritis   . Hypertension   . GERD (gastroesophageal reflux disease)   . Headache(784.0)   . Kidney stones    Past Surgical History  Procedure Laterality Date  . Colonoscopy N/A 03/31/2013    Procedure: COLONOSCOPY;  Surgeon: Rogene Houston, MD;  Location: [facility];  Service: Endoscopy;  Laterality: N/A;  730  . Cardiac catheterization  2009  . Cholecystectomy N/A 10/24/2013    Procedure: LAPAROSCOPIC CHOLECYSTECTOMY;  Surgeon: Jamesetta So, MD;  Location: [facility] ORS;  Service: General;  Laterality: N/A;   Family History  Problem Relation Age of Onset  . Diabetes Mother    . Heart failure Mother   . Heart disease Mother   . Heart failure Father    History  Substance Use Topics  . Smoking status: Former Smoker    Types: Cigarettes  . Smokeless tobacco: Not on file  . Alcohol Use: No    Review of Systems  Gastrointestinal: Positive for abdominal pain.  All other systems reviewed and are negative.     Allergies  Review of patient's allergies indicates no known allergies.  Home Medications   Prior to Admission medications   Medication Sig Start Date End Date Taking? Authorizing Provider  acetaminophen (TYLENOL) 500 MG tablet Take 1,500 mg by mouth every 6 (six) hours as needed for headache.    Historical Provider, MD  ALPRAZolam Duanne Moron) 0.5 MG tablet Take 0.5 mg by mouth at bedtime as needed. FOR  MOOD STABILIZER    Historical Provider, MD  aspirin EC 81 MG tablet Take 81 mg by mouth daily.    Historical Provider, MD  diltiazem (TIAZAC) 240 MG 24 hr capsule Take 240 mg by mouth daily.    Historical Provider, MD  HYDROcodone-acetaminophen (NORCO) 10-325 MG per tablet Take 1 tablet by mouth every 4 (four) hours as needed. 10/25/13   Aviva Signs Md, MD  ibuprofen (ADVIL,MOTRIN) 800 MG tablet Take 1 tablet (800 mg total) by mouth 3 (three) times daily. 06/12/14   Noemi Chapel, MD  lisinopril (PRINIVIL,ZESTRIL) 40 MG tablet Take 40 mg by mouth daily.    Historical Provider, MD  metFORMIN (GLUCOPHAGE) 500 MG tablet Take 1,000-1,500 mg by mouth 2 (two)  times daily. Takes three tablets in the morning and and two tablets in the afternoon    Historical Provider, MD  Multiple Vitamin (MULTIVITAMIN WITH MINERALS) TABS tablet Take 1 tablet by mouth daily.    Historical Provider, MD  omeprazole (PRILOSEC OTC) 20 MG tablet Take 20 mg by mouth every morning. 08/26/10 10/21/13  Annita Brod, MD  OxyCODONE (OXYCONTIN) 10 mg T12A 12 hr tablet Take 1 tablet (10 mg total) by mouth every 12 (twelve) hours as needed (severe pain). 10/25/13   Aviva Signs Md, MD  potassium  chloride SA (K-DUR,KLOR-CON) 20 MEQ tablet Take 1 tablet (20 mEq total) by mouth 2 (two) times daily. 06/12/14   Noemi Chapel, MD   BP 173/104 mmHg  Pulse 94  Temp(Src) 98.3 F (36.8 C) (Oral)  Resp 16  Ht 5\' 9"  (1.753 m)  Wt 228 lb (103.42 kg)  BMI 33.65 kg/m2  SpO2 96% Physical Exam  Constitutional: He appears well-developed and well-nourished. No distress.  HENT:  Head: Normocephalic and atraumatic.  Mouth/Throat: Oropharynx is clear and moist. No oropharyngeal exudate.  Eyes: Conjunctivae and EOM are normal. Pupils are equal, round, and reactive to light. Right eye exhibits no discharge. Left eye exhibits no discharge. No scleral icterus.  Neck: Normal range of motion. Neck supple. No JVD present. No thyromegaly present.  Cardiovascular: Normal rate, regular rhythm, normal heart sounds and intact distal pulses.  Exam reveals no gallop and no friction rub.   No murmur heard. Pulmonary/Chest: Effort normal and breath sounds normal. No respiratory distress. He has no wheezes. He has no rales.  Abdominal: Soft. Bowel sounds are normal. He exhibits no distension and no mass. There is tenderness (minimal right midabdominal tenderness, mild right CVA tenderness, no guarding, no Murphy sign, no pain at McBurney's point).  Musculoskeletal: Normal range of motion. He exhibits no edema or tenderness.  Lymphadenopathy:    He has no cervical adenopathy.  Neurological: He is alert. Coordination normal.  Skin: Skin is warm and dry. No rash noted. No erythema.  Psychiatric: He has a normal mood and affect. His behavior is normal.  Nursing note and vitals reviewed.   ED Course  Procedures (including critical care time) Labs Review Labs Reviewed  URINALYSIS, ROUTINE W REFLEX MICROSCOPIC - Abnormal; Notable for the following:    Specific Gravity, Urine >1.030 (*)    Ketones, ur TRACE (*)    Protein, ur 100 (*)    All other components within normal limits  COMPREHENSIVE METABOLIC PANEL -  Abnormal; Notable for the following:    Potassium 3.3 (*)    Glucose, Bld 145 (*)    Calcium 8.8 (*)    All other components within normal limits  URINE MICROSCOPIC-ADD ON - Abnormal; Notable for the following:    Bacteria, UA FEW (*)    Casts GRANULAR CAST (*)    All other components within normal limits  CBC WITH DIFFERENTIAL/PLATELET    Imaging Review Ct Renal Stone Study  06/12/2014   CLINICAL DATA:  Right flank pain extending into inguinal region for 1 day  EXAM: CT ABDOMEN AND PELVIS WITHOUT CONTRAST  TECHNIQUE: Multidetector CT imaging of the abdomen and pelvis was performed following the standard protocol without oral or intravenous contrast material administration.  COMPARISON:  October 21, 2013  FINDINGS: Lung bases are clear.  No focal liver lesions are identified on this noncontrast enhanced study. Gallbladder is absent. There is no appreciable biliary duct dilatation.  Spleen, pancreas, and adrenals appear normal. Kidneys  bilaterally show no mass or hydronephrosis on either side. There is no renal or ureteral calculus on either side.  In the pelvis, the urinary bladder is midline with normal wall thickness. There is fat in each inguinal ring. There are multiple sigmoid diverticula without diverticulitis. There is no pelvic mass or pelvic fluid collection. The appendix appears normal.  There is no bowel obstruction.  No free air or portal venous air.  There is no ascites, adenopathy, or abscess in the abdomen or pelvis. There is atherosclerotic change in the aorta but no aneurysm. There is degenerative change in the lumbar spine. There are pars defects at L5 bilaterally with mild anterolisthesis of L5 on S1. There is severe disc degeneration and narrowing at L2-3. There is moderate spinal stenosis at L2-3 due to bony hypertrophy as well as diffuse disc protrusion. Similar changes are noted at L3-4.  IMPRESSION: No renal or ureteral calculus.  No hydronephrosis.  Sigmoid diverticulosis  without diverticulitis.  No bowel obstruction.  No abscess.  Appendix appears normal.  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 Spinal stenosis at L3-4 and L4-5, multifactorial.   Electronically Signed   By: Lowella Grip III M.D.   On: 06/12/2014 14:14     MDM   Final diagnoses:  Abdominal pain  Hypokalemia    The patient has signs and symptoms which are not 100% consistent with kidney stone or appendicitis, etiology is unclear, check labs and CT scan to initially rule out kidney stone, CT scan without contrast ordered, pain medications ordered.  CT and labs without acute findings - needs small am't of hypokalemia.  Meds given in ED:  Medications  ondansetron (ZOFRAN) injection 4 mg (4 mg Intravenous Given 06/12/14 1333)  ketorolac (TORADOL) 30 MG/ML injection 30 mg (30 mg Intravenous Given 06/12/14 1333)    New Prescriptions   IBUPROFEN (ADVIL,MOTRIN) 800 MG TABLET    Take 1 tablet (800 mg total) by mouth 3 (three) times daily.   POTASSIUM CHLORIDE SA (K-DUR,KLOR-CON) 20 MEQ TABLET    Take 1 tablet (20 mEq total) by mouth 2 (two) times daily.      Noemi Chapel, MD 06/12/14 [phone]

## 2014-06-12 NOTE — ED Notes (Signed)
Patient with no complaints at this time. Respirations even and unlabored. Skin warm/dry. Discharge instructions reviewed with patient at this time. Patient given opportunity to voice concerns/ask questions. IV removed per policy and band-aid applied to site. Patient discharged at this time and left Emergency Department with steady gait.  

## 2014-06-12 NOTE — ED Notes (Signed)
Pt c/o right side abd pain that radiates to right groin area that started yesterday, has hx of kidney stones,

## 2015-03-13 ENCOUNTER — Emergency Department (HOSPITAL_COMMUNITY)
Admission: EM | Admit: 2015-03-13 | Discharge: 2015-03-13 | Disposition: A | Discharge status: Home / Self Care | Payer: 59 | Attending: Emergency Medicine | Admitting: Emergency Medicine

## 2015-03-13 ENCOUNTER — Emergency Department (HOSPITAL_COMMUNITY): Payer: 59

## 2015-03-13 ENCOUNTER — Encounter (HOSPITAL_COMMUNITY): Payer: Self-pay

## 2015-03-13 DIAGNOSIS — Z79899 Other long term (current) drug therapy: Secondary | ICD-10-CM | POA: Diagnosis not present

## 2015-03-13 DIAGNOSIS — Z7984 Long term (current) use of oral hypoglycemic drugs: Secondary | ICD-10-CM | POA: Insufficient documentation

## 2015-03-13 DIAGNOSIS — I10 Essential (primary) hypertension: Secondary | ICD-10-CM | POA: Insufficient documentation

## 2015-03-13 DIAGNOSIS — R1032 Left lower quadrant pain: Secondary | ICD-10-CM | POA: Diagnosis present

## 2015-03-13 DIAGNOSIS — Z7982 Long term (current) use of aspirin: Secondary | ICD-10-CM | POA: Insufficient documentation

## 2015-03-13 DIAGNOSIS — N50819 Testicular pain, unspecified: Secondary | ICD-10-CM | POA: Diagnosis not present

## 2015-03-13 DIAGNOSIS — R51 Headache: Secondary | ICD-10-CM | POA: Insufficient documentation

## 2015-03-13 DIAGNOSIS — T07XXXA Unspecified multiple injuries, initial encounter: Secondary | ICD-10-CM

## 2015-03-13 DIAGNOSIS — Z87442 Personal history of urinary calculi: Secondary | ICD-10-CM | POA: Insufficient documentation

## 2015-03-13 DIAGNOSIS — N50812 Left testicular pain: Secondary | ICD-10-CM

## 2015-03-13 DIAGNOSIS — Z87891 Personal history of nicotine dependence: Secondary | ICD-10-CM | POA: Insufficient documentation

## 2015-03-13 DIAGNOSIS — E119 Type 2 diabetes mellitus without complications: Secondary | ICD-10-CM | POA: Diagnosis not present

## 2015-03-13 LAB — CBC WITH DIFFERENTIAL/PLATELET
BASOS PCT: 1 %
Basophils Absolute: 0.1 10*3/uL (ref 0.0–0.1)
EOS ABS: 0.1 10*3/uL (ref 0.0–0.7)
EOS PCT: 1 %
HCT: 42.8 % (ref 39.0–52.0)
Hemoglobin: 15.4 g/dL (ref 13.0–17.0)
Lymphocytes Relative: 16 %
Lymphs Abs: 1.3 10*3/uL (ref 0.7–4.0)
MCH: 31.8 pg (ref 26.0–34.0)
MCHC: 36 g/dL (ref 30.0–36.0)
MCV: 88.2 fL (ref 78.0–100.0)
MONO ABS: 0.7 10*3/uL (ref 0.1–1.0)
Monocytes Relative: 9 %
Neutro Abs: 5.8 10*3/uL (ref 1.7–7.7)
Neutrophils Relative %: 73 %
PLATELETS: 171 10*3/uL (ref 150–400)
RBC: 4.85 MIL/uL (ref 4.22–5.81)
RDW: 13.5 % (ref 11.5–15.5)
WBC: 7.9 10*3/uL (ref 4.0–10.5)

## 2015-03-13 LAB — COMPREHENSIVE METABOLIC PANEL
ALBUMIN: 4.1 g/dL (ref 3.5–5.0)
ALT: 29 U/L (ref 17–63)
ANION GAP: 10 (ref 5–15)
AST: 27 U/L (ref 15–41)
Alkaline Phosphatase: 63 U/L (ref 38–126)
BUN: 11 mg/dL (ref 6–20)
CHLORIDE: 102 mmol/L (ref 101–111)
CO2: 24 mmol/L (ref 22–32)
Calcium: 8.8 mg/dL — ABNORMAL LOW (ref 8.9–10.3)
Creatinine, Ser: 0.63 mg/dL (ref 0.61–1.24)
GFR calc Af Amer: >60 mL/min (ref >60)
Glucose, Bld: 259 mg/dL — ABNORMAL HIGH (ref 65–99)
POTASSIUM: 3.4 mmol/L — AB (ref 3.5–5.1)
Sodium: 136 mmol/L (ref 135–145)
Total Bilirubin: 0.7 mg/dL (ref 0.3–1.2)
Total Protein: 6.9 g/dL (ref 6.5–8.1)

## 2015-03-13 LAB — URINALYSIS, ROUTINE W REFLEX MICROSCOPIC
BILIRUBIN URINE: NEGATIVE
Glucose, UA: 100 mg/dL — AB
Hgb urine dipstick: NEGATIVE
KETONES UR: NEGATIVE mg/dL
LEUKOCYTES UA: NEGATIVE
NITRITE: NEGATIVE
PH: 7 (ref 5.0–8.0)
Protein, ur: NEGATIVE mg/dL
Specific Gravity, Urine: <1.005 — ABNORMAL LOW (ref 1.005–1.030)

## 2015-03-13 LAB — SAMPLE TO BLOOD BANK

## 2015-03-13 MED ORDER — MORPHINE SULFATE (PF) 4 MG/ML IV SOLN
4.0000 mg | Freq: Once | INTRAVENOUS | Status: AC
  Administered 2015-03-13: 4 mg via INTRAVENOUS
  Filled 2015-03-13: qty 1

## 2015-03-13 MED ORDER — HYDROCODONE-ACETAMINOPHEN 5-325 MG PO TABS: 1.0000 | ORAL_TABLET | ORAL | Status: DC | PRN

## 2015-03-13 MED ORDER — IOHEXOL 300 MG/ML  SOLN
100.0000 mL | Freq: Once | INTRAMUSCULAR | Status: AC | PRN
  Administered 2015-03-13: 100 mL via INTRAVENOUS

## 2015-03-13 MED ORDER — ONDANSETRON HCL 4 MG/2ML IJ SOLN
4.0000 mg | Freq: Once | INTRAMUSCULAR | Status: AC
  Administered 2015-03-13: 4 mg via INTRAVENOUS
  Filled 2015-03-13: qty 2
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
## 2015-03-13 NOTE — ED Notes (Signed)
Patient c/o LLQ pain that radiates into left testicle. Redness noted to left testicle.
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
## 2015-03-13 NOTE — ED Notes (Signed)
Returned from CT.

## 2015-03-13 NOTE — ED Notes (Signed)
Tolerated po fluid challenge well.

## 2015-03-13 NOTE — ED Notes (Signed)
Patient ambulatory to restroom  To obtain ua samle without difficulty.

## 2015-03-13 NOTE — ED Notes (Signed)
MD at bedside. 
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
## 2015-07-09 ENCOUNTER — Other Ambulatory Visit (HOSPITAL_COMMUNITY): Payer: Self-pay | Admitting: Family Medicine

## 2015-07-09 ENCOUNTER — Ambulatory Visit (HOSPITAL_COMMUNITY)
Admission: RE | Admit: 2015-07-09 | Discharge: 2015-07-09 | Disposition: A | Discharge status: Home / Self Care | Payer: 59 | Source: Ambulatory Visit | Attending: Family Medicine | Admitting: Family Medicine

## 2015-07-09 DIAGNOSIS — J069 Acute upper respiratory infection, unspecified: Secondary | ICD-10-CM | POA: Diagnosis not present

## 2015-08-17 IMAGING — US US ABDOMEN LIMITED
1 series · 14 of 25 positions shown · non-contrast
Comparison: CT abdomen and pelvis 10/21/2013

CLINICAL DATA: Right upper quadrant pain for 2 days with nausea.
Cholelithiasis.

EXAM:
US ABDOMEN LIMITED - RIGHT UPPER QUADRANT

[Series 1: us abdomen limited · 0.20mm/px · 14 of 54 slices shown]
[im 1/54]
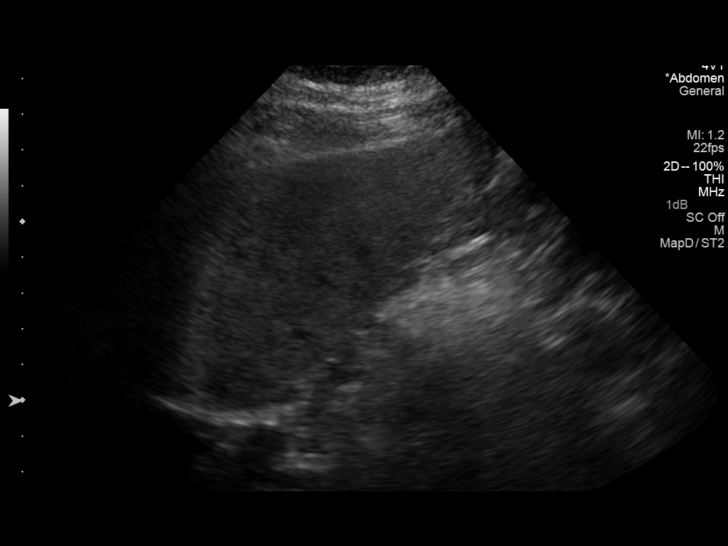
[im 5/54]
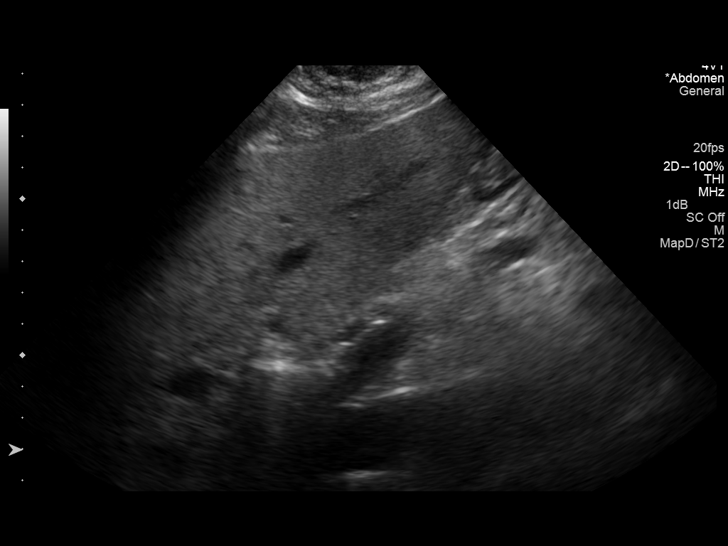
[im 9/54]
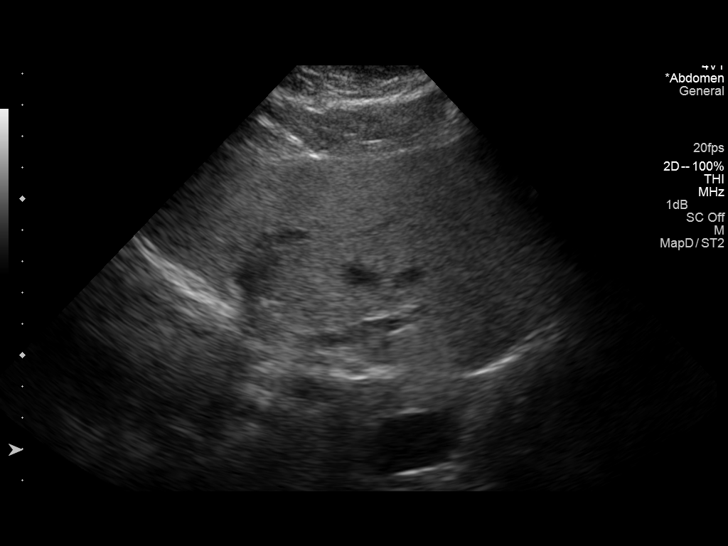
[im 14/54]
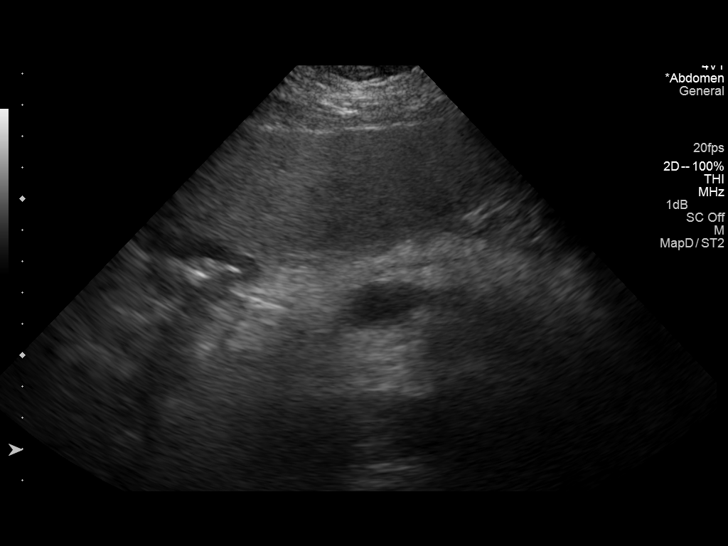
[im 18/54]
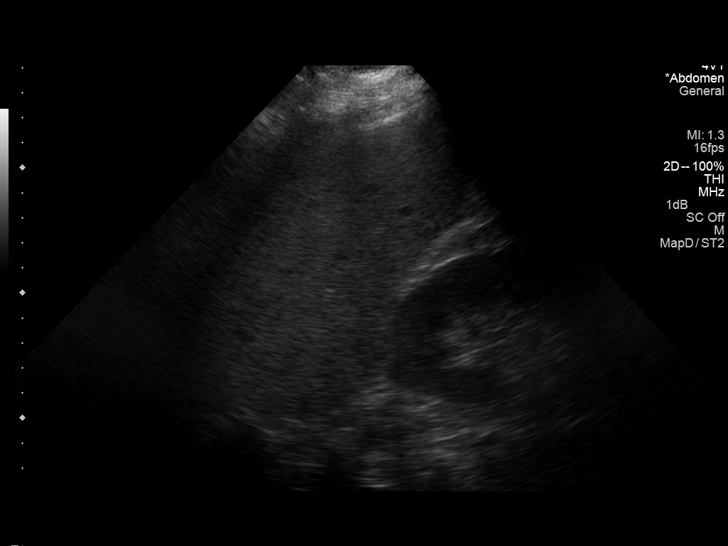
[im 20/54]
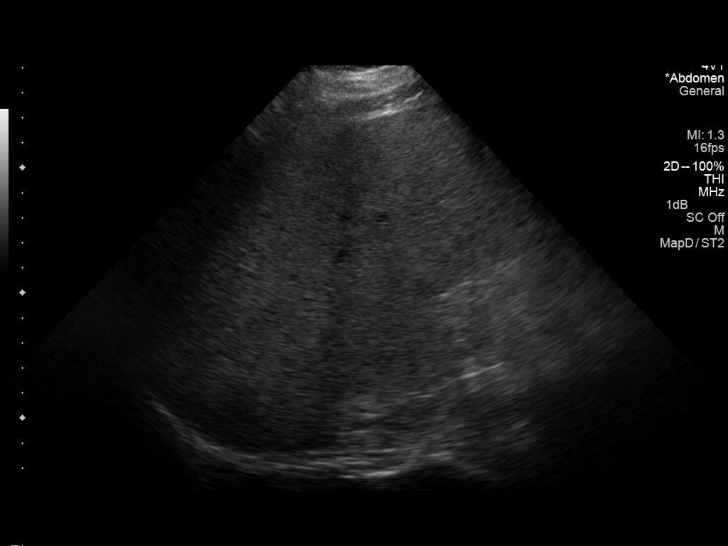
[im 25/54]
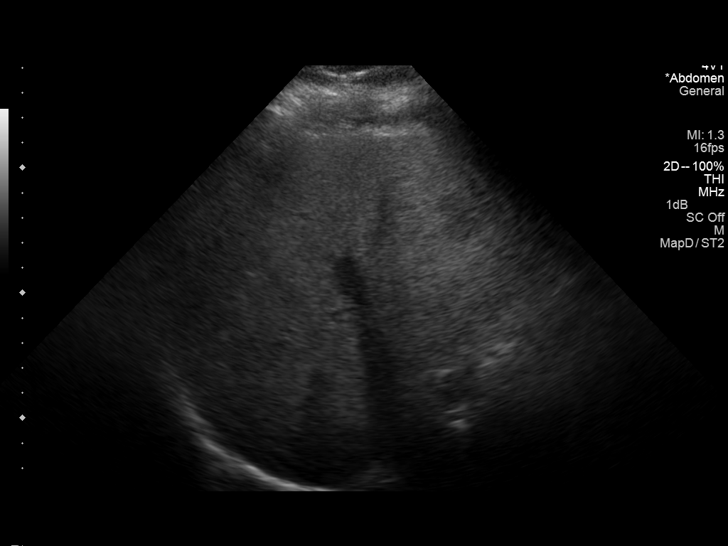
[im 29/54]
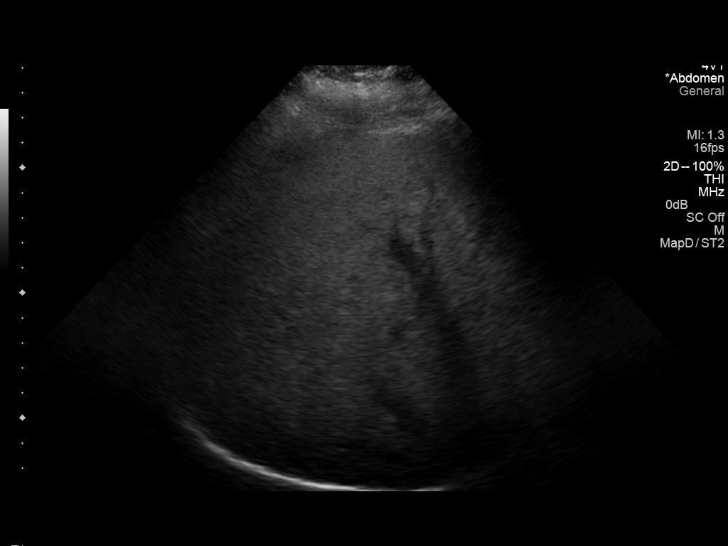
[im 34/54]
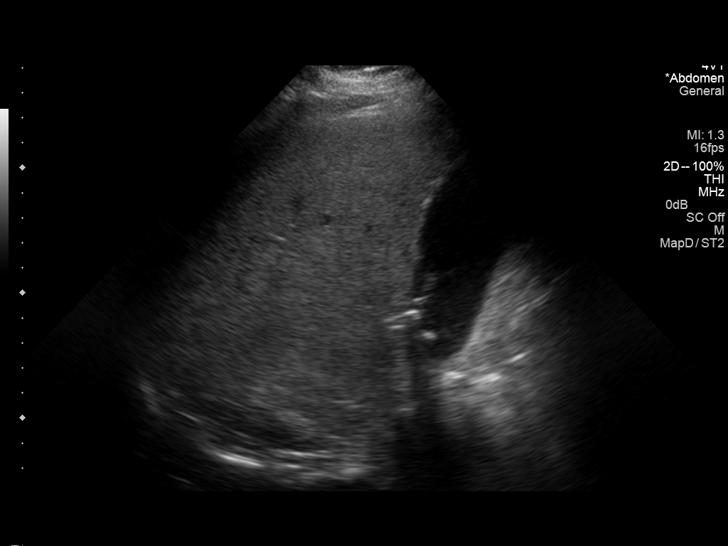
[im 36/54]
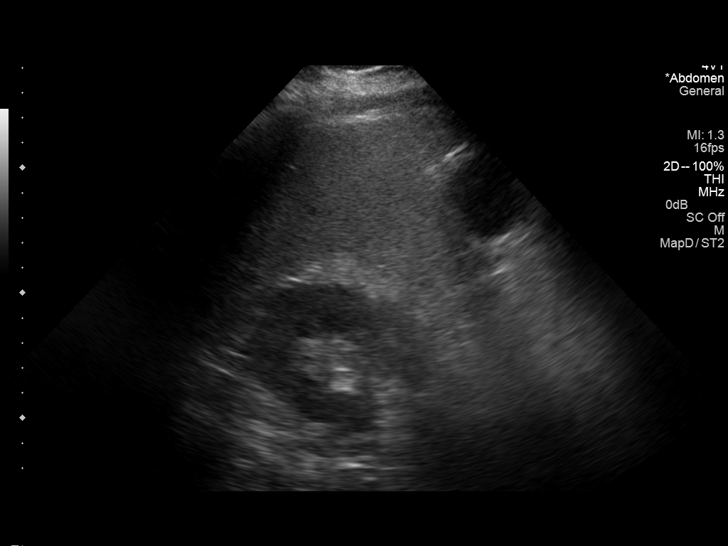
[im 40/54]
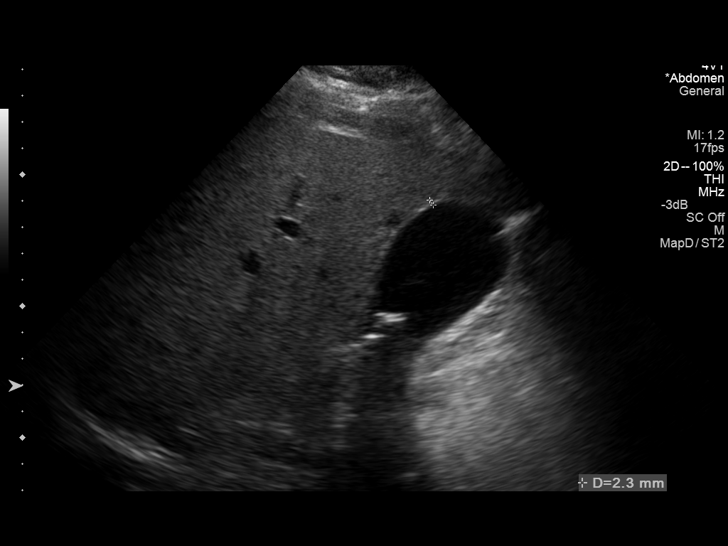
[im 45/54]
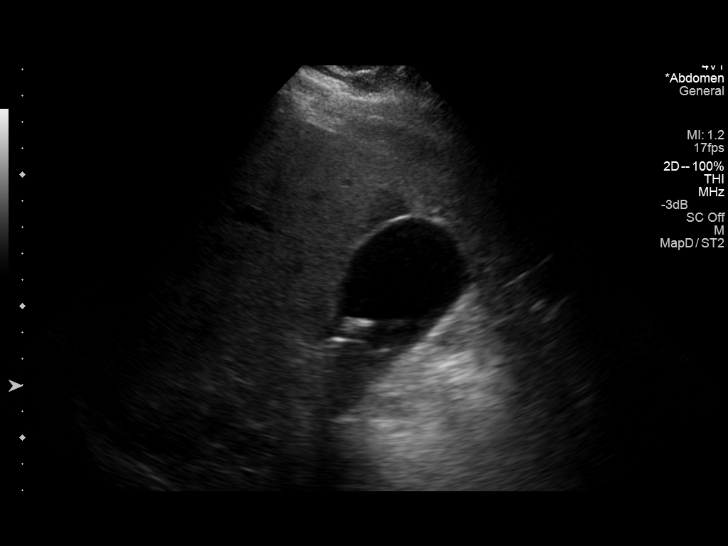
[im 49/54]
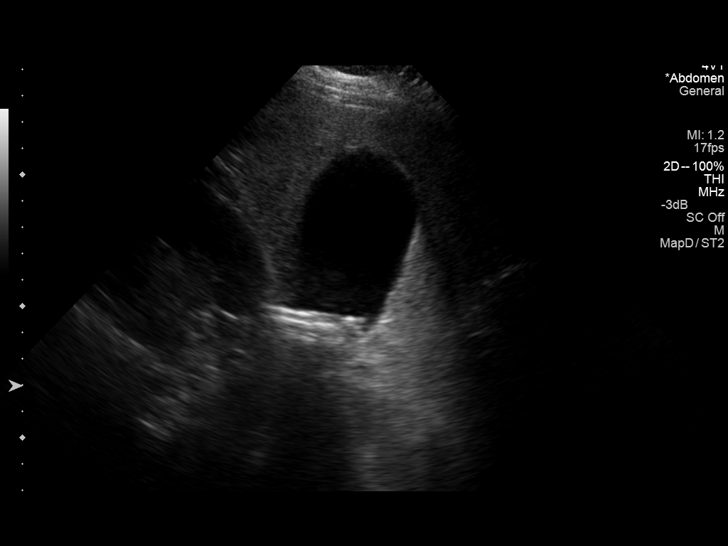
[im 54/54]
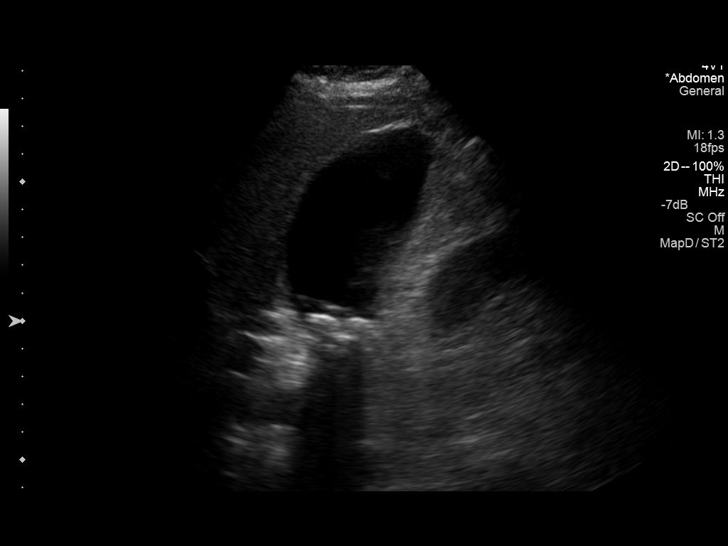

[14 of 25 positions shown; findings below may reference images not displayed]

FINDINGS: Gallbladder:

Multiple gallstones were seen measuring up to approximately 1 cm in
size. Gallbladder wall was not thickened, measuring 2.3 mm.
Sonographic Murphy's sign was negative. Gallbladder sludge also
noted.

Common bile duct:

Diameter: 6 mm

Liver:

Diffusely increased echogenicity and mildly enlarged, measuring
cm in length. No focal abnormality identified.
IMPRESSION: 1. Cholelithiasis without evidence of acute cholecystitis. No
biliary dilatation.
2. Hepatic steatosis.

## 2016-01-30 ENCOUNTER — Emergency Department (HOSPITAL_COMMUNITY): Payer: 59

## 2016-01-30 ENCOUNTER — Encounter (HOSPITAL_COMMUNITY): Payer: Self-pay | Admitting: *Deleted

## 2016-01-30 ENCOUNTER — Emergency Department (HOSPITAL_COMMUNITY)
Admission: EM | Admit: 2016-01-30 | Discharge: 2016-01-30 | Disposition: A | Discharge status: Home / Self Care | Payer: 59 | Attending: Emergency Medicine | Admitting: Emergency Medicine

## 2016-01-30 DIAGNOSIS — Z87891 Personal history of nicotine dependence: Secondary | ICD-10-CM | POA: Diagnosis not present

## 2016-01-30 DIAGNOSIS — I10 Essential (primary) hypertension: Secondary | ICD-10-CM | POA: Insufficient documentation

## 2016-01-30 DIAGNOSIS — Z79899 Other long term (current) drug therapy: Secondary | ICD-10-CM | POA: Insufficient documentation

## 2016-01-30 DIAGNOSIS — R079 Chest pain, unspecified: Secondary | ICD-10-CM | POA: Diagnosis not present

## 2016-01-30 DIAGNOSIS — R1031 Right lower quadrant pain: Secondary | ICD-10-CM | POA: Diagnosis not present

## 2016-01-30 DIAGNOSIS — R1010 Upper abdominal pain, unspecified: Secondary | ICD-10-CM

## 2016-01-30 DIAGNOSIS — E119 Type 2 diabetes mellitus without complications: Secondary | ICD-10-CM | POA: Insufficient documentation

## 2016-01-30 DIAGNOSIS — Z7984 Long term (current) use of oral hypoglycemic drugs: Secondary | ICD-10-CM | POA: Insufficient documentation

## 2016-01-30 DIAGNOSIS — R1011 Right upper quadrant pain: Secondary | ICD-10-CM | POA: Diagnosis not present

## 2016-01-30 DIAGNOSIS — K5730 Diverticulosis of large intestine without perforation or abscess without bleeding: Secondary | ICD-10-CM | POA: Diagnosis not present

## 2016-01-30 LAB — BASIC METABOLIC PANEL
Anion gap: 10 (ref 5–15)
BUN: 20 mg/dL (ref 6–20)
CALCIUM: 8.9 mg/dL (ref 8.9–10.3)
CO2: 26 mmol/L (ref 22–32)
CREATININE: 0.82 mg/dL (ref 0.61–1.24)
Chloride: 103 mmol/L (ref 101–111)
Glucose, Bld: 210 mg/dL — ABNORMAL HIGH (ref 65–99)
Potassium: 3.5 mmol/L (ref 3.5–5.1)
SODIUM: 139 mmol/L (ref 135–145)

## 2016-01-30 LAB — HEPATIC FUNCTION PANEL
ALBUMIN: 4 g/dL (ref 3.5–5.0)
ALK PHOS: 89 U/L (ref 38–126)
ALT: 35 U/L (ref 17–63)
AST: 22 U/L (ref 15–41)
Bilirubin, Direct: 0.1 mg/dL (ref 0.1–0.5)
Indirect Bilirubin: 0.5 mg/dL (ref 0.3–0.9)
TOTAL PROTEIN: 6.9 g/dL (ref 6.5–8.1)
Total Bilirubin: 0.6 mg/dL (ref 0.3–1.2)

## 2016-01-30 LAB — URINALYSIS, ROUTINE W REFLEX MICROSCOPIC
Bilirubin Urine: NEGATIVE
GLUCOSE, UA: 250 mg/dL — AB
Hgb urine dipstick: NEGATIVE
LEUKOCYTES UA: NEGATIVE
Nitrite: NEGATIVE
PROTEIN: 100 mg/dL — AB
Specific Gravity, Urine: >1.03 — ABNORMAL HIGH (ref 1.005–1.030)
pH: 5.5 (ref 5.0–8.0)

## 2016-01-30 LAB — I-STAT TROPONIN, ED: TROPONIN I, POC: 0.01 ng/mL (ref 0.00–0.08)

## 2016-01-30 LAB — CBC
HCT: 45.1 % (ref 39.0–52.0)
Hemoglobin: 15.6 g/dL (ref 13.0–17.0)
MCH: 31 pg (ref 26.0–34.0)
MCHC: 34.6 g/dL (ref 30.0–36.0)
MCV: 89.5 fL (ref 78.0–100.0)
PLATELETS: 184 10*3/uL (ref 150–400)
RBC: 5.04 MIL/uL (ref 4.22–5.81)
RDW: 13.7 % (ref 11.5–15.5)
WBC: 5 10*3/uL (ref 4.0–10.5)

## 2016-01-30 LAB — LIPASE, BLOOD: LIPASE: 30 U/L (ref 11–51)

## 2016-01-30 LAB — URINALYSIS, MICROSCOPIC (REFLEX)
BACTERIA UA: NONE SEEN
Squamous Epithelial / LPF: NONE SEEN
WBC, UA: NONE SEEN WBC/hpf (ref 0–5)

## 2016-01-30 MED ORDER — ONDANSETRON HCL 4 MG/2ML IJ SOLN
4.0000 mg | Freq: Once | INTRAMUSCULAR | Status: AC
  Administered 2016-01-30: 4 mg via INTRAVENOUS
  Filled 2016-01-30: qty 2

## 2016-01-30 MED ORDER — MORPHINE SULFATE (PF) 4 MG/ML IV SOLN
4.0000 mg | Freq: Once | INTRAVENOUS | Status: AC
  Administered 2016-01-30: 4 mg via INTRAVENOUS
  Filled 2016-01-30: qty 1

## 2016-01-30 MED ORDER — ONDANSETRON 4 MG PO TBDP: ORAL_TABLET | ORAL | 0 refills | Status: DC

## 2016-01-30 MED ORDER — PANTOPRAZOLE SODIUM 40 MG IV SOLR
40.0000 mg | Freq: Once | INTRAVENOUS | Status: AC
  Administered 2016-01-30: 40 mg via INTRAVENOUS
  Filled 2016-01-30: qty 40

## 2016-01-30 MED ORDER — SODIUM CHLORIDE 0.9 % IV SOLN
1000.0000 mL | INTRAVENOUS | Status: DC
  Administered 2016-01-30: 1000 mL via INTRAVENOUS

## 2016-01-30 MED ORDER — IOPAMIDOL (ISOVUE-300) INJECTION 61%
100.0000 mL | Freq: Once | INTRAVENOUS | Status: AC | PRN
  Administered 2016-01-30: 100 mL via INTRAVENOUS

## 2016-01-30 MED ORDER — IOPAMIDOL (ISOVUE-300) INJECTION 61%
INTRAVENOUS | Status: AC
  Filled 2016-01-30: qty 30

## 2016-01-30 MED ORDER — SODIUM CHLORIDE 0.9 % IV SOLN
1000.0000 mL | Freq: Once | INTRAVENOUS | Status: AC
  Administered 2016-01-30: 1000 mL via INTRAVENOUS

## 2016-01-30 MED ORDER — DICYCLOMINE HCL 20 MG PO TABS: ORAL_TABLET | ORAL | 0 refills | Status: DC

## 2016-01-30 NOTE — ED Notes (Signed)
Pt taken to CT scan.

## 2016-01-30 NOTE — ED Notes (Signed)
Pt ambulated to bathroom 

## 2016-01-30 NOTE — ED Provider Notes (Signed)
[facility] DEPT Provider Note   CSN: [id number] Arrival date & time: 01/30/16  0555     History   Chief Complaint Chief Complaint  Patient presents with  . Abdominal Pain    HPI [name] is a 56 y.o. male.  HPI  Past Medical History:  Diagnosis Date  . Arthritis   . Diabetes mellitus   . GERD (gastroesophageal reflux disease)   . Headache(784.0)   . Hypertension   . Kidney stones     Patient Active Problem List   Diagnosis Date Noted  . Acute biliary pancreatitis 10/23/2013  . Biliary colic [id number]  . Abdominal pain, right upper quadrant 10/21/2013  . Esophageal ulcer without bleeding 08/26/2010  . HTN (hypertension) 08/26/2010  . DM type 2 (diabetes mellitus, type 2) ([place]) 08/25/2010  . Chronic pain 08/25/2010    Past Surgical History:  Procedure Laterality Date  . CARDIAC CATHETERIZATION  2009  . CHOLECYSTECTOMY N/A 10/24/2013   Procedure: LAPAROSCOPIC CHOLECYSTECTOMY;  Surgeon: Jamesetta So, MD;  Location: [facility];  Service: General;  Laterality: N/A;  . COLONOSCOPY N/A 03/31/2013   Procedure: COLONOSCOPY;  Surgeon: Rogene Houston, MD;  Location: [facility];  Service: Endoscopy;  Laterality: N/A;  730       Home Medications    Prior to Admission medications   Medication Sig Start Date End Date Taking? Authorizing Provider  ALPRAZolam Duanne Moron) 1 MG tablet Take 1 mg by mouth 4 (four) times daily as needed for anxiety or sleep.  02/25/15   Historical Provider, MD  aspirin EC 81 MG tablet Take 81 mg by mouth daily.    Historical Provider, MD  diltiazem (TIAZAC) 240 MG 24 hr capsule Take 240 mg by mouth daily.    Historical Provider, MD  HYDROcodone-acetaminophen (NORCO/VICODIN) 5-325 MG tablet Take 1 tablet by mouth every 4 (four) hours as needed. 03/13/15   Evalee Jefferson, PA-C  lisinopril (PRINIVIL,ZESTRIL) 40 MG tablet Take 40 mg by mouth daily.    Historical Provider, MD  metFORMIN (GLUCOPHAGE) 500 MG tablet Take 1,000-1,500 mg by mouth 2  (two) times daily. Takes three tablets in the morning and and two tablets in the afternoon    Historical Provider, MD  Multiple Vitamin (MULTIVITAMIN WITH MINERALS) TABS tablet Take 1 tablet by mouth daily.    Historical Provider, MD  omeprazole (PRILOSEC) 20 MG capsule Take 20 mg by mouth daily. 02/21/15   Historical Provider, MD  Specialty Vitamins Products ([employer] E[employer] HEALTH FORMULA) CAPS Take 1 capsule by mouth daily.    Historical Provider, MD    Family History Family History  Problem Relation Age of Onset  . Diabetes Mother   . Heart failure Mother   . Heart disease Mother   . Heart failure Father     Social History Social History  Substance Use Topics  . Smoking status: Former Smoker    Types: Cigarettes  . Smokeless tobacco: Never Used  . Alcohol use No     Allergies   Patient has no known allergies.   Review of Systems Review of Systems   Physical Exam Updated Vital Signs BP 138/86   Pulse 86   Temp 98.4 F (36.9 C)   Resp 15   Ht 5\' 9"  (1.753 m)   Wt 207 lb (93.9 kg)   SpO2 100%   BMI 30.57 kg/m   Physical Exam   ED Treatments / Results  Labs (all labs ordered are listed, but only abnormal results are displayed) Labs  Reviewed  BASIC METABOLIC PANEL - Abnormal; Notable for the following:       Result Value   Glucose, Bld 210 (*)    All other components within normal limits  URINALYSIS, ROUTINE W REFLEX MICROSCOPIC - Abnormal; Notable for the following:    Specific Gravity, Urine >1.030 (*)    Glucose, UA 250 (*)    Ketones, ur TRACE (*)    Protein, ur 100 (*)    All other components within normal limits  CBC  URINALYSIS, MICROSCOPIC (REFLEX)  HEPATIC FUNCTION PANEL  LIPASE, BLOOD  I-STAT TROPOININ, ED    EKG  EKG Interpretation  Date/Time:  Wednesday January 30 2016 06:13:02 EST Ventricular Rate:  86 PR Interval:    QRS Duration: 101 QT Interval:  374 QTC Calculation: 448 R Axis:   91 Text Interpretation:  Sinus rhythm Left posterior  fascicular block Probable anterior infarct, old No significant change was found Confirmed by CAMPOS  MD, Lennette Bihari ([id number]) on 01/30/2016 6:35:17 AM       Radiology Dg Chest 2 View  Result Date: 01/30/2016 CLINICAL DATA:  Episode of chest pain and nausea and vomiting 5 days ago which resolved. Right upper quadrant pain radiating to the right flank appeared yesterday. EXAM: CHEST  2 VIEW COMPARISON:  PA and lateral chest x-ray of July 09, 2015 FINDINGS: The lungs are borderline hypoinflated. There is no focal infiltrate. There is no pleural effusion. The heart and pulmonary vascularity are normal. The mediastinum is normal in width. The bony thorax exhibits no acute abnormality. IMPRESSION: There is no acute cardiopulmonary abnormality. Electronically Signed   By: David  Martinique M.D.   On: 01/30/2016 07:06    Procedures Procedures (including critical care time)  Medications Ordered in ED Medications  0.9 %  sodium chloride infusion (1,000 mLs Intravenous New Bag/Given 01/30/16 0645)    Followed by  0.9 %  sodium chloride infusion (1,000 mLs Intravenous New Bag/Given 01/30/16 0645)  morphine 4 MG/ML injection 4 mg (4 mg Intravenous Given 01/30/16 0645)  ondansetron (ZOFRAN) injection 4 mg (4 mg Intravenous Given 01/30/16 0645)     Initial Impression / Assessment and Plan / ED Course  I have reviewed the triage vital signs and the nursing notes.  Pertinent labs & imaging results that were available during my care of the patient were reviewed by me and considered in my medical decision making (see chart for details).  Clinical Course       Final Clinical Impressions(s) / ED Diagnoses   Final diagnoses:  None    New Prescriptions New Prescriptions   No medications on file     Milton Ferguson, MD 02/02/16 1021

## 2016-01-30 NOTE — ED Triage Notes (Signed)
Pt states he had some chest pain x 5 days ago and began to have n/v; pt states the symptoms went away in a couple of days and yesterday he started having pain in his right upper quadrant that radiates around to his flank area;

## 2016-01-30 NOTE — ED Notes (Signed)
Pt made aware to return if symptoms worsen or if any life threatening symptoms occur.   

## 2016-01-30 NOTE — ED Provider Notes (Signed)
[place] DEPT Provider Note   CSN: [id number] Arrival date & time: 01/30/16  0555     History   Chief Complaint Chief Complaint  Patient presents with  . Abdominal Pain    HPI RAFIEL DOSTER is a 56 y.o. male.  Patient states he's had nausea vomiting and some loose stools and abdominal discomfort   The history is provided by the patient. No language interpreter was used.  Abdominal Pain   This is a new problem. The current episode started 12 to 24 hours ago. The problem occurs constantly. The problem has not changed since onset.The pain is associated with an unknown factor. The pain is located in the RLQ and RUQ. Associated symptoms include diarrhea, nausea and vomiting. Pertinent negatives include frequency, hematuria and headaches.    Past Medical History:  Diagnosis Date  . Arthritis   . Diabetes mellitus   . GERD (gastroesophageal reflux disease)   . Headache(784.0)   . Hypertension   . Kidney stones     Patient Active Problem List   Diagnosis Date Noted  . Acute biliary pancreatitis 10/23/2013  . Biliary colic [id number]  . Abdominal pain, right upper quadrant 10/21/2013  . Esophageal ulcer without bleeding 08/26/2010  . HTN (hypertension) 08/26/2010  . DM type 2 (diabetes mellitus, type 2) ([place]) 08/25/2010  . Chronic pain 08/25/2010    Past Surgical History:  Procedure Laterality Date  . CARDIAC CATHETERIZATION  2009  . CHOLECYSTECTOMY N/A 10/24/2013   Procedure: LAPAROSCOPIC CHOLECYSTECTOMY;  Surgeon: Jamesetta So, MD;  Location: [facility];  Service: General;  Laterality: N/A;  . COLONOSCOPY N/A 03/31/2013   Procedure: COLONOSCOPY;  Surgeon: Rogene Houston, MD;  Location: [facility];  Service: Endoscopy;  Laterality: N/A;  730       Home Medications    Prior to Admission medications   Medication Sig Start Date End Date Taking? Authorizing Provider  acetaminophen (TYLENOL) 325 MG tablet Take 325-975 mg by mouth daily as needed for  headache.    Yes Historical Provider, MD  aspirin EC 81 MG tablet Take 81 mg by mouth daily.   Yes Historical Provider, MD  HYDROcodone-acetaminophen (NORCO) 10-325 MG tablet Take 1 tablet by mouth every 4 (four) hours as needed. 01/25/16  Yes Historical Provider, MD  ibuprofen (ADVIL,MOTRIN) 200 MG tablet Take 200-600 mg by mouth daily as needed for headache.    Yes Historical Provider, MD  Multiple Vitamin (MULTIVITAMIN WITH MINERALS) TABS tablet Take 1 tablet by mouth daily.   Yes Historical Provider, MD  Specialty Vitamins Products ([employer] E[employer] FORMULA) CAPS Take 1 capsule by mouth daily.   Yes Historical Provider, MD  ALPRAZolam Duanne Moron) 1 MG tablet Take 1 mg by mouth 4 (four) times daily as needed for anxiety or sleep.  02/25/15   Historical Provider, MD  dicyclomine (BENTYL) 20 MG tablet Take one every 8 hours as needed for abd cramps 01/30/16   Milton Ferguson, MD  diltiazem [facility]) 240 MG 24 hr capsule Take 240 mg by mouth daily.    Historical Provider, MD  lisinopril (PRINIVIL,ZESTRIL) 40 MG tablet Take 40 mg by mouth daily.    Historical Provider, MD  metFORMIN (GLUCOPHAGE) 500 MG tablet Take 1,000-1,500 mg by mouth 2 (two) times daily. Takes three tablets in the morning and and two tablets in the afternoon    Historical Provider, MD  omeprazole (PRILOSEC) 20 MG capsule Take 20 mg by mouth daily. 02/21/15   Historical Provider, MD  ondansetron (ZOFRAN ODT)  4 MG disintegrating tablet 4mg  ODT q4 hours prn nausea/vomit 01/30/16   Milton Ferguson, MD    Family History Family History  Problem Relation Age of Onset  . Diabetes Mother   . Heart failure Mother   . Heart disease Mother   . Heart failure Father     Social History Social History  Substance Use Topics  . Smoking status: Former Smoker    Types: Cigarettes  . Smokeless tobacco: Never Used  . Alcohol use No     Allergies   Patient has no known allergies.   Review of Systems Review of Systems  Constitutional: Negative for  appetite change and fatigue.  HENT: Negative for congestion, ear discharge and sinus pressure.   Eyes: Negative for discharge.  Respiratory: Negative for cough.   Cardiovascular: Negative for chest pain.  Gastrointestinal: Positive for abdominal pain, diarrhea, nausea and vomiting.  Genitourinary: Negative for frequency and hematuria.  Musculoskeletal: Negative for back pain.  Skin: Negative for rash.  Neurological: Negative for seizures and headaches.  Psychiatric/Behavioral: Negative for hallucinations.     Physical Exam Updated Vital Signs BP 138/86   Pulse 86   Temp 98.4 F (36.9 C)   Resp 15   Ht 5\' 9"  (1.753 m)   Wt 207 lb (93.9 kg)   SpO2 100%   BMI 30.57 kg/m   Physical Exam  Constitutional: He is oriented to person, place, and time. He appears well-developed.  HENT:  Head: Normocephalic.  Eyes: Conjunctivae and EOM are normal. No scleral icterus.  Neck: Neck supple. No thyromegaly present.  Cardiovascular: Normal rate and regular rhythm.  Exam reveals no gallop and no friction rub.   No murmur heard. Pulmonary/Chest: No stridor. He has no wheezes. He has no rales. He exhibits no tenderness.  Abdominal: He exhibits no distension. There is tenderness. There is no rebound.  Mild tenderness to right lower quadrant  Musculoskeletal: Normal range of motion. He exhibits no edema.  Lymphadenopathy:    He has no cervical adenopathy.  Neurological: He is oriented to person, place, and time. He exhibits normal muscle tone. Coordination normal.  Skin: No rash noted. No erythema.  Psychiatric: He has a normal mood and affect. His behavior is normal.     ED Treatments / Results  Labs (all labs ordered are listed, but only abnormal results are displayed) Labs Reviewed  BASIC METABOLIC PANEL - Abnormal; Notable for the following:       Result Value   Glucose, Bld 210 (*)    All other components within normal limits  URINALYSIS, ROUTINE W REFLEX MICROSCOPIC - Abnormal;  Notable for the following:    Specific Gravity, Urine >1.030 (*)    Glucose, UA 250 (*)    Ketones, ur TRACE (*)    Protein, ur 100 (*)    All other components within normal limits  CBC  HEPATIC FUNCTION PANEL  LIPASE, BLOOD  URINALYSIS, MICROSCOPIC (REFLEX)  I-STAT TROPOININ, ED    EKG  EKG Interpretation  Date/Time:  Wednesday January 30 2016 06:13:02 EST Ventricular Rate:  86 PR Interval:    QRS Duration: 101 QT Interval:  374 QTC Calculation: 448 R Axis:   91 Text Interpretation:  Sinus rhythm 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 Right upper quadrant pain radiating to the right flank appeared yesterday. EXAM: CHEST  2 VIEW COMPARISON:  PA and lateral chest x-ray of July 09, 2015 FINDINGS: The lungs are borderline hypoinflated. There is no focal infiltrate. There is no pleural effusion. The heart and pulmonary vascularity are normal. The mediastinum is normal in width. The bony thorax exhibits no acute abnormality. IMPRESSION: There is no acute cardiopulmonary abnormality. Electronically Signed   By: David  Martinique M.D.   On: 01/30/2016 07:06   Ct Abdomen Pelvis W Contrast  Result Date: 01/30/2016 CLINICAL DATA:  Nausea, vomiting, some chest pain over the last 5 days, now with pain in the right upper quadrant radiating to the right flank EXAM: CT ABDOMEN AND PELVIS WITH CONTRAST TECHNIQUE: Multidetector CT imaging of the abdomen and pelvis was performed using the standard protocol following bolus administration of intravenous contrast. CONTRAST:  112mL ISOVUE-300 IOPAMIDOL (ISOVUE-300) INJECTION 61% COMPARISON:  CT abdomen pelvis of 03/13/2015 FINDINGS: Lower chest: The lung bases are clear. The heart is within normal  limits in size. Hepatobiliary: The liver enhances with no focal abnormality and no ductal dilatation is seen. Surgical clips are present from prior cholecystectomy. Pancreas: The pancreas is slightly atrophic. No pancreatic ductal dilatation is seen. Spleen: The spleen is unremarkable. Adrenals/Urinary Tract: The adrenal glands appear normal. The kidneys enhance with no calculus or mass. On delayed images, the pelvocaliceal systems are unremarkable. The ureters are normal in caliber. The urinary bladder is not well distended but no abnormality is seen. Stomach/Bowel: The stomach is not well distended. No small bowel distention is seen. There are multiple rectosigmoid colon diverticula present but no diverticulitis is noted. There is feces throughout the colon. The terminal ileum and the appendix are well visualized with no abnormality noted. Vascular/Lymphatic: The abdominal aorta is normal in caliber with only mild abdominal aortic atherosclerosis present. No adenopathy is seen. Reproductive: The prostate is normal in size. Other: None. Musculoskeletal: Bilateral pars defects are present L5. There is degenerative disc disease particularly at L2-3 and L5-S1 with loss of disc space and sclerosis with spurring and vacuum disc phenomenon. IMPRESSION: 1. No explanation for the patient's abdominal pain and clinical symptoms is seen. The appendix and terminal ileum are unremarkable. No renal or ureteral calculi are seen. 2. Multiple rectosigmoid colon diverticula. 3. Mild abdominal aortic atherosclerosis. 4. 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 Electronically Signed   By: Ivar Drape M.D.   On: 01/30/2016 10:20    Procedures Procedures (including critical care time)  Medications Ordered in ED Medications  0.9 %  sodium chloride infusion (0 mLs Intravenous Stopped 01/30/16 0732)    Followed by  0.9 %  sodium chloride infusion (1,000 mLs Intravenous New Bag/Given 01/30/16  0645)  iopamidol (ISOVUE-300) 61 % injection (not administered)  morphine 4 MG/ML injection 4 mg (4 mg Intravenous Given 01/30/16 0645)  ondansetron (ZOFRAN) injection 4 mg (4 mg Intravenous Given 01/30/16 0645)  pantoprazole (PROTONIX) injection 40 mg (40 mg Intravenous Given 01/30/16 0734)  iopamidol (ISOVUE-300) 61 % injection 100 mL (100 mLs Intravenous Contrast Given 01/30/16 0939)     Initial Impression / Assessment and Plan / ED Course  I have reviewed the triage vital signs and the nursing notes.  Pertinent labs & imaging results that were available during my care of the patient were reviewed by me and considered in my medical decision making (see chart for details).  Clinical  Course     Patient with mild abdominal discomfort and vomiting. CT scan unremarkable. Patient will be put on Bentyl and Zofran will follow-up with PCP  Final Clinical Impressions(s) / ED Diagnoses   Final diagnoses:  Pain of upper abdomen    New Prescriptions New Prescriptions   DICYCLOMINE (BENTYL) 20 MG TABLET    Take one every 8 hours as needed for abd cramps   ONDANSETRON (ZOFRAN ODT) 4 MG DISINTEGRATING TABLET    4mg  ODT q4 hours prn nausea/vomit     Milton Ferguson, MD 01/30/16 1035

## 2016-01-30 NOTE — ED Provider Notes (Signed)
MSE was initiated and I personally evaluated the patient and placed orders (if any) at  6:34 AM on January 30, 2016.  The patient appears stable so that the remainder of the MSE may be completed by another provider.   Jola Schmidt, MD 01/30/16 [phone]

## 2016-01-30 NOTE — Discharge Instructions (Signed)
Follow up with your md next week for recheck.  Drink plenty of fluids °

## 2016-01-31 DIAGNOSIS — Z1389 Encounter for screening for other disorder: Secondary | ICD-10-CM | POA: Diagnosis not present

## 2016-01-31 DIAGNOSIS — E1142 Type 2 diabetes mellitus with diabetic polyneuropathy: Secondary | ICD-10-CM | POA: Diagnosis not present

## 2016-01-31 DIAGNOSIS — R079 Chest pain, unspecified: Secondary | ICD-10-CM | POA: Diagnosis not present

## 2016-01-31 DIAGNOSIS — R109 Unspecified abdominal pain: Secondary | ICD-10-CM | POA: Diagnosis not present

## 2016-03-21 DIAGNOSIS — G894 Chronic pain syndrome: Secondary | ICD-10-CM | POA: Diagnosis not present

## 2016-04-25 DIAGNOSIS — Z1389 Encounter for screening for other disorder: Secondary | ICD-10-CM | POA: Diagnosis not present

## 2016-04-25 DIAGNOSIS — E1129 Type 2 diabetes mellitus with other diabetic kidney complication: Secondary | ICD-10-CM | POA: Diagnosis not present

## 2016-06-18 DIAGNOSIS — G894 Chronic pain syndrome: Secondary | ICD-10-CM | POA: Diagnosis not present

## 2016-06-19 ENCOUNTER — Other Ambulatory Visit (HOSPITAL_COMMUNITY): Payer: Self-pay | Admitting: Internal Medicine

## 2016-09-12 ENCOUNTER — Encounter (HOSPITAL_COMMUNITY): Payer: Self-pay | Admitting: *Deleted

## 2016-09-12 ENCOUNTER — Emergency Department (HOSPITAL_COMMUNITY)
Admission: EM | Admit: 2016-09-12 | Discharge: 2016-09-12 | Disposition: A | Discharge status: Home / Self Care | Payer: Self-pay | Attending: Emergency Medicine | Admitting: Emergency Medicine

## 2016-09-12 ENCOUNTER — Emergency Department (HOSPITAL_COMMUNITY): Payer: Self-pay

## 2016-09-12 DIAGNOSIS — Z7984 Long term (current) use of oral hypoglycemic drugs: Secondary | ICD-10-CM | POA: Insufficient documentation

## 2016-09-12 DIAGNOSIS — I10 Essential (primary) hypertension: Secondary | ICD-10-CM | POA: Insufficient documentation

## 2016-09-12 DIAGNOSIS — E119 Type 2 diabetes mellitus without complications: Secondary | ICD-10-CM | POA: Insufficient documentation

## 2016-09-12 DIAGNOSIS — M25562 Pain in left knee: Secondary | ICD-10-CM

## 2016-09-12 DIAGNOSIS — N50812 Left testicular pain: Secondary | ICD-10-CM

## 2016-09-12 DIAGNOSIS — Z87891 Personal history of nicotine dependence: Secondary | ICD-10-CM | POA: Insufficient documentation

## 2016-09-12 DIAGNOSIS — Z7982 Long term (current) use of aspirin: Secondary | ICD-10-CM | POA: Insufficient documentation

## 2016-09-12 DIAGNOSIS — Z79899 Other long term (current) drug therapy: Secondary | ICD-10-CM | POA: Insufficient documentation

## 2016-09-12 DIAGNOSIS — K4090 Unilateral inguinal hernia, without obstruction or gangrene, not specified as recurrent: Secondary | ICD-10-CM

## 2016-09-12 LAB — URINALYSIS, ROUTINE W REFLEX MICROSCOPIC
BACTERIA UA: NONE SEEN
Bilirubin Urine: NEGATIVE
Glucose, UA: >=500 mg/dL — AB
Hgb urine dipstick: NEGATIVE
Ketones, ur: 5 mg/dL — AB
Leukocytes, UA: NEGATIVE
Nitrite: NEGATIVE
PH: 5 (ref 5.0–8.0)
Protein, ur: 100 mg/dL — AB
SPECIFIC GRAVITY, URINE: 1.027 (ref 1.005–1.030)
SQUAMOUS EPITHELIAL / LPF: NONE SEEN

## 2016-09-12 MED ORDER — TRAMADOL HCL 50 MG PO TABS: 50.0000 mg | ORAL_TABLET | Freq: Four times a day (QID) | ORAL | 0 refills | Status: DC | PRN

## 2016-09-12 MED ORDER — TRAMADOL HCL 50 MG PO TABS
50.0000 mg | ORAL_TABLET | Freq: Once | ORAL | Status: AC
  Administered 2016-09-12: 50 mg via ORAL
  Filled 2016-09-12: qty 1

## 2016-09-12 NOTE — ED Triage Notes (Signed)
Pain in left testicle, left hip and left knee

## 2016-09-12 NOTE — ED Provider Notes (Signed)
[place] DEPT Provider Note   CSN: [id number] Arrival date & time: 09/12/16  1150     History   Chief Complaint Chief Complaint  Patient presents with  . Testicle Pain    HPI [name] is a 56 y.o. male.  HPI Patient presents with roughly one week of left testicular pain. States the pain is worse with movement. He thinks he may have had some penile discharge early in the week. He does have some mild dysuria. No hematuria. Also complains of left sided low back pain and left knee pain. 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 No focal weakness or numbness. No incontinence. Past Medical History:  Diagnosis Date  . Arthritis   . Diabetes mellitus   . GERD (gastroesophageal reflux disease)   . Headache(784.0)   . Hypertension   . Kidney stones     Patient Active Problem List   Diagnosis Date Noted  . Acute biliary pancreatitis 10/23/2013  . Biliary colic 64/40/3474  . Abdominal pain, right upper quadrant 10/21/2013  . Esophageal ulcer without bleeding 08/26/2010  . HTN (hypertension) 08/26/2010  . DM type 2 (diabetes mellitus, type 2) ([place]) 08/25/2010  . Chronic pain 08/25/2010    Past Surgical History:  Procedure Laterality Date  . CARDIAC CATHETERIZATION  2009  . CHOLECYSTECTOMY N/A 10/24/2013   Procedure: LAPAROSCOPIC CHOLECYSTECTOMY;  Surgeon: Jamesetta So, MD;  Location: [facility];  Service: General;  Laterality: N/A;  . COLONOSCOPY N/A 03/31/2013   Procedure: COLONOSCOPY;  Surgeon: Rogene Houston, MD;  Location: [facility];  Service: Endoscopy;  Laterality: N/A;  730       Home Medications    Prior to Admission medications   Medication Sig Start Date End Date Taking? Authorizing Provider  acetaminophen (TYLENOL) 325 MG tablet Take 325-975 mg by mouth daily as needed for headache.    Yes [provider]  ALPRAZolam Duanne Moron) 1 MG tablet Take 1 mg by mouth 4 (four) times daily as needed for anxiety or  sleep.  02/25/15  Yes [provider]  aspirin EC 81 MG tablet Take 81 mg by mouth daily.   Yes [provider]  gabapentin (NEURONTIN) 100 MG capsule Take 100 mg by mouth 3 (three) times daily.   Yes [provider]  HYDROcodone-acetaminophen (NORCO) 10-325 MG tablet Take 1 tablet by mouth every 4 (four) hours as needed. 01/25/16  Yes [provider]  lisinopril (PRINIVIL,ZESTRIL) 40 MG tablet Take 40 mg by mouth daily.   Yes [provider]  MATZIM LA 420 MG 24 hr tablet Take 1 tablet by mouth daily. 12/30/15  Yes [provider]  metFORMIN (GLUCOPHAGE) 500 MG tablet Take 500-1,000 mg by mouth 3 (three) times daily with meals. Takes a total of 2500mg  per day.   Yes [provider]  Multiple Vitamin (MULTIVITAMIN WITH MINERALS) TABS tablet Take 1 tablet by mouth daily.   Yes [provider]  omeprazole (PRILOSEC) 20 MG capsule Take 20 mg by mouth daily. 02/21/15  Yes [provider]  Specialty Vitamins Products ([employer] E[employer] HEALTH FORMULA) CAPS Take 1 capsule by mouth daily.   Yes [provider]  dicyclomine (BENTYL) 20 MG tablet Take one every 8 hours as needed for abd cramps Patient not taking: Reported on 09/12/2016 01/30/16   Milton Ferguson, MD  ondansetron (ZOFRAN ODT) 4 MG disintegrating tablet 4mg  ODT q4 hours prn nausea/vomit Patient not taking: Reported on 09/12/2016 01/30/16  Milton Ferguson, MD  traMADol (ULTRAM) 50 MG tablet Take 1 tablet (50 mg total) by mouth every 6 (six) hours as needed. 09/12/16   Julianne Rice, MD    Family History Family History  Problem Relation Age of Onset  . Diabetes Mother   . Heart failure Mother   . Heart disease Mother   . Heart failure Father     Social History Social History  Substance Use Topics  . Smoking status: Former Smoker    Types: Cigarettes  . Smokeless tobacco: Never Used  . Alcohol use No     Allergies   Patient has no known  allergies.   Review of Systems Review of Systems  Constitutional: Negative for chills, fatigue and fever.  Respiratory: Negative for cough and shortness of breath.   Cardiovascular: Negative for chest pain.  Gastrointestinal: Negative for abdominal pain, diarrhea and nausea.  Genitourinary: Positive for discharge, dysuria, scrotal swelling and testicular pain. Negative for difficulty urinating, flank pain, frequency, hematuria, penile pain and penile swelling.  Musculoskeletal: Positive for arthralgias, back pain and myalgias. Negative for joint swelling.  Skin: Negative for rash and wound.  Neurological: Negative for dizziness, weakness, light-headedness, numbness and headaches.  All other systems reviewed and are negative.    Physical Exam Updated Vital Signs BP (!) 143/89   Pulse 90   Temp 98.2 F (36.8 C) (Oral)   Resp 17   Ht 5\' 9"  (1.753 m)   Wt 86.2 kg (190 lb)   SpO2 99%   BMI 28.06 kg/m   Physical Exam  Constitutional: He is oriented to person, place, and time. He appears well-developed and well-nourished. No distress.  HENT:  Head: Normocephalic and atraumatic.  Mouth/Throat: Oropharynx is clear and moist.  Eyes: Pupils are equal, round, and reactive to light. EOM are normal.  Neck: Normal range of motion. Neck supple.  Cardiovascular: Normal rate and regular rhythm.   Pulmonary/Chest: Effort normal and breath sounds normal.  Abdominal: Soft. Bowel sounds are normal. He exhibits no distension. There is tenderness (very mild left upper and left lower quadrant tenderness to palpation.). There is no rebound and no guarding.  Genitourinary:  Genitourinary Comments: Testicles appear to be in a normal lie. Some mild left testicular tenderness with palpation. No erythema or warmth. Patient has a reducible left inguinal hernia. Unable to elicit cremasteric reflex bilaterally. No penile discharge.  Musculoskeletal: Normal range of motion. He exhibits no edema or tenderness.   Patient has left paraspinal lumbar tenderness to palpation. There is no CVA tenderness bilaterally. No midline thoracic or lumbar tenderness. Negative straight leg raise bilaterally. She does have mild amount of crepitance with range of motion of the left knee. No obvious effusion, erythema or warmth. Distal pulses are 2+.  Neurological: He is alert and oriented to person, place, and time.  Patient is alert and oriented x3 with clear, goal oriented speech. Patient has 5/5 motor in all extremities. Sensation is intact to light touch. No saddle anesthesia. Patient has a normal gait and walks without assistance.  Skin: Skin is warm and dry. Capillary refill takes less than 2 seconds. No rash noted. No erythema.  Psychiatric: He has a normal mood and affect. His behavior is normal.  Nursing note and vitals reviewed.    ED Treatments / Results  Labs (all labs ordered are listed, but only abnormal results are displayed) Labs Reviewed  URINALYSIS, ROUTINE W REFLEX MICROSCOPIC - Abnormal; Notable for the following:       Result  Value   Color, Urine AMBER (*)    APPearance CLOUDY (*)    Glucose, UA >=500 (*)    Ketones, ur 5 (*)    Protein, ur 100 (*)    All other components within normal limits  GC/CHLAMYDIA PROBE AMP ([place]) NOT AT [facility]    EKG  EKG Interpretation None       Radiology US Scrotum  Result Date: 09/12/2016 CLINICAL DATA:  Left testicular pain. EXAM: SCROTAL ULTRASOUND DOPPLER ULTRASOUND OF THE TESTICLES TECHNIQUE: Complete ultrasound examination of the testicles, epididymis, and other scrotal structures was performed. Color and spectral Doppler ultrasound were also utilized to evaluate blood flow to the testicles. COMPARISON:  None. FINDINGS: Right testicle Measurements: 4.5 x 2 x 3 cm. Microlithiasis is now appreciated. No mass or focal lesion. Normal intraparenchymal vascularity. Left testicle Measurements: 4.3 x 2.3 x 2.9 cm. Scattered microlithiasis. No mass or  focal lesion. Normal intraparenchymal vascularity. Right epididymis:  Normal in size and appearance. Left epididymis:  Normal in size and appearance. Hydrocele:  Small right-sided hydrocele. Varicocele:  None visualized. Pulsed Doppler interrogation of both testes demonstrates normal low resistance arterial and venous waveforms bilaterally. IMPRESSION: 1. No acute findings. No evidence of testicular torsion or orchitis. No evidence of epididymitis. 2. Stable small hydrocele. 3. 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 In the absence of other risk factors for testicular cancer (personal history of testicular cancer, family history of testicular cancer, history of cryptorchidism, or other risk factors), no imaging follow-up for this finding is necessary. Recommend routine monthly testicular self examination. If patient has risk factors for testicular cancer, consider referral to urologist for evaluation and determination of optimal follow-up strategy. Electronically Signed   By: Franki Cabot M.D.   On: 09/12/2016 13:51   [place] Art/ven Flow Abd Pelv Doppler  Result Date: 09/12/2016 CLINICAL DATA:  Left testicular pain. EXAM: SCROTAL ULTRASOUND DOPPLER ULTRASOUND OF THE TESTICLES TECHNIQUE: Complete ultrasound examination of the testicles, epididymis, and other scrotal structures was performed. Color and spectral Doppler ultrasound were also utilized to evaluate blood flow to the testicles. COMPARISON:  None. FINDINGS: Right testicle Measurements: 4.5 x 2 x 3 cm. Microlithiasis is now appreciated. No mass or focal lesion. Normal intraparenchymal vascularity. Left testicle Measurements: 4.3 x 2.3 x 2.9 cm. Scattered microlithiasis. No mass or focal lesion. Normal intraparenchymal vascularity. Right epididymis:  Normal in size and appearance. Left epididymis:  Normal in size and appearance. Hydrocele:  Small right-sided hydrocele. Varicocele:  None visualized. Pulsed  Doppler interrogation of both testes demonstrates normal low resistance arterial and venous waveforms bilaterally. IMPRESSION: 1. No acute findings. No evidence of testicular torsion or orchitis. No evidence of epididymitis. 2. Stable small hydrocele. 3. 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 In the absence of other risk factors for testicular cancer (personal history of testicular cancer, family history of testicular cancer, history of cryptorchidism, or other risk factors), no imaging follow-up for this finding is necessary. Recommend routine monthly testicular self examination. If patient has risk factors for testicular cancer, consider referral to urologist for evaluation and determination of optimal follow-up strategy. Electronically Signed   By: Franki Cabot M.D.   On: 09/12/2016 13:51    Procedures Procedures (including critical care time)  Medications Ordered in ED Medications  traMADol (ULTRAM) tablet 50 mg (50 mg Oral Given 09/12/16 1444)     Initial Impression / Assessment and Plan / ED Course  I have reviewed the triage vital  signs and the nursing notes.  Pertinent labs & imaging results that were available during my care of the patient were reviewed by me and considered in my medical decision making (see chart for details).    Exam is consistent with left inguinal hernia which is easily reduced. No evidence of obstruction or gangrene. Ultrasound without acute findings. No risk factors for testicular cancer. UA without evidence of infection. Urine GC is been sent. Advised to follow-up with general surgery. Advised to wear supportive undergarments and avoid heavy lifting. Return precautions given.  Final Clinical Impressions(s) / ED Diagnoses   Final diagnoses:  Inguinal hernia, left  Arthralgia of left knee    New Prescriptions Discharge Medication List as of 09/12/2016  2:40 PM    START taking these medications    Details  traMADol (ULTRAM) 50 MG tablet Take 1 tablet (50 mg total) by mouth every 6 (six) hours as needed., Starting Fri 09/12/2016, Print         Julianne Rice, MD 09/12/16 [phone]

## 2016-09-12 NOTE — ED Notes (Signed)
Pt returned from US

## 2016-09-15 LAB — GC/CHLAMYDIA PROBE AMP (~~LOC~~) NOT AT ARMC
Chlamydia: NEGATIVE
Neisseria Gonorrhea: NEGATIVE

## 2016-10-31 DIAGNOSIS — E291 Testicular hypofunction: Secondary | ICD-10-CM | POA: Diagnosis not present

## 2016-10-31 DIAGNOSIS — Z1389 Encounter for screening for other disorder: Secondary | ICD-10-CM | POA: Diagnosis not present

## 2016-10-31 DIAGNOSIS — G894 Chronic pain syndrome: Secondary | ICD-10-CM | POA: Diagnosis not present

## 2016-11-14 DIAGNOSIS — I10 Essential (primary) hypertension: Secondary | ICD-10-CM | POA: Diagnosis not present

## 2016-11-14 DIAGNOSIS — Z23 Encounter for immunization: Secondary | ICD-10-CM | POA: Diagnosis not present

## 2016-11-14 DIAGNOSIS — J329 Chronic sinusitis, unspecified: Secondary | ICD-10-CM | POA: Diagnosis not present

## 2016-11-14 DIAGNOSIS — E1165 Type 2 diabetes mellitus with hyperglycemia: Secondary | ICD-10-CM | POA: Diagnosis not present

## 2016-11-26 DIAGNOSIS — R7989 Other specified abnormal findings of blood chemistry: Secondary | ICD-10-CM | POA: Diagnosis not present

## 2016-11-26 DIAGNOSIS — Z23 Encounter for immunization: Secondary | ICD-10-CM | POA: Diagnosis not present

## 2016-12-30 DIAGNOSIS — R7989 Other specified abnormal findings of blood chemistry: Secondary | ICD-10-CM | POA: Diagnosis not present

## 2017-02-02 DIAGNOSIS — E291 Testicular hypofunction: Secondary | ICD-10-CM | POA: Diagnosis not present

## 2017-02-02 DIAGNOSIS — G894 Chronic pain syndrome: Secondary | ICD-10-CM | POA: Diagnosis not present

## 2017-02-02 DIAGNOSIS — E1142 Type 2 diabetes mellitus with diabetic polyneuropathy: Secondary | ICD-10-CM | POA: Diagnosis not present

## 2017-02-02 DIAGNOSIS — J329 Chronic sinusitis, unspecified: Secondary | ICD-10-CM | POA: Diagnosis not present

## 2017-02-24 ENCOUNTER — Emergency Department (HOSPITAL_COMMUNITY)
Admission: EM | Admit: 2017-02-24 | Discharge: 2017-02-24 | Disposition: A | Discharge status: Home / Self Care | Payer: Commercial Managed Care - PPO | Attending: Physician Assistant | Admitting: Physician Assistant

## 2017-02-24 ENCOUNTER — Emergency Department (HOSPITAL_COMMUNITY): Payer: Commercial Managed Care - PPO

## 2017-02-24 ENCOUNTER — Encounter (HOSPITAL_COMMUNITY): Payer: Self-pay

## 2017-02-24 ENCOUNTER — Other Ambulatory Visit: Payer: Self-pay

## 2017-02-24 DIAGNOSIS — Z79899 Other long term (current) drug therapy: Secondary | ICD-10-CM | POA: Diagnosis not present

## 2017-02-24 DIAGNOSIS — Z7982 Long term (current) use of aspirin: Secondary | ICD-10-CM | POA: Insufficient documentation

## 2017-02-24 DIAGNOSIS — N433 Hydrocele, unspecified: Secondary | ICD-10-CM | POA: Diagnosis not present

## 2017-02-24 DIAGNOSIS — R109 Unspecified abdominal pain: Secondary | ICD-10-CM

## 2017-02-24 DIAGNOSIS — Z87891 Personal history of nicotine dependence: Secondary | ICD-10-CM | POA: Diagnosis not present

## 2017-02-24 DIAGNOSIS — B349 Viral infection, unspecified: Secondary | ICD-10-CM | POA: Insufficient documentation

## 2017-02-24 DIAGNOSIS — J3489 Other specified disorders of nose and nasal sinuses: Secondary | ICD-10-CM | POA: Diagnosis not present

## 2017-02-24 DIAGNOSIS — Z7984 Long term (current) use of oral hypoglycemic drugs: Secondary | ICD-10-CM | POA: Insufficient documentation

## 2017-02-24 DIAGNOSIS — E119 Type 2 diabetes mellitus without complications: Secondary | ICD-10-CM | POA: Insufficient documentation

## 2017-02-24 DIAGNOSIS — R1031 Right lower quadrant pain: Secondary | ICD-10-CM | POA: Insufficient documentation

## 2017-02-24 DIAGNOSIS — I10 Essential (primary) hypertension: Secondary | ICD-10-CM | POA: Diagnosis not present

## 2017-02-24 LAB — COMPREHENSIVE METABOLIC PANEL
ALBUMIN: 4.4 g/dL (ref 3.5–5.0)
ALK PHOS: 68 U/L (ref 38–126)
ALT: 30 U/L (ref 17–63)
AST: 30 U/L (ref 15–41)
Anion gap: 13 (ref 5–15)
BUN: 12 mg/dL (ref 6–20)
CALCIUM: 9.2 mg/dL (ref 8.9–10.3)
CHLORIDE: 106 mmol/L (ref 101–111)
CO2: 20 mmol/L — AB (ref 22–32)
Creatinine, Ser: 0.82 mg/dL (ref 0.61–1.24)
GFR calc Af Amer: >60 mL/min (ref >60)
GFR calc non Af Amer: >60 mL/min (ref >60)
GLUCOSE: 135 mg/dL — AB (ref 65–99)
Potassium: 3.7 mmol/L (ref 3.5–5.1)
SODIUM: 139 mmol/L (ref 135–145)
Total Bilirubin: 1 mg/dL (ref 0.3–1.2)
Total Protein: 7.1 g/dL (ref 6.5–8.1)

## 2017-02-24 LAB — CBC
HCT: 47.1 % (ref 39.0–52.0)
Hemoglobin: 16.6 g/dL (ref 13.0–17.0)
MCH: 32.4 pg (ref 26.0–34.0)
MCHC: 35.2 g/dL (ref 30.0–36.0)
MCV: 92 fL (ref 78.0–100.0)
PLATELETS: 165 10*3/uL (ref 150–400)
RBC: 5.12 MIL/uL (ref 4.22–5.81)
RDW: 13.8 % (ref 11.5–15.5)
WBC: 6.4 10*3/uL (ref 4.0–10.5)

## 2017-02-24 LAB — URINALYSIS, ROUTINE W REFLEX MICROSCOPIC
Bilirubin Urine: NEGATIVE
GLUCOSE, UA: NEGATIVE mg/dL
Hgb urine dipstick: NEGATIVE
KETONES UR: 5 mg/dL — AB
Leukocytes, UA: NEGATIVE
Nitrite: NEGATIVE
PROTEIN: 30 mg/dL — AB
Specific Gravity, Urine: 1.024 (ref 1.005–1.030)
pH: 5 (ref 5.0–8.0)

## 2017-02-24 LAB — LIPASE, BLOOD: LIPASE: 37 U/L (ref 11–51)

## 2017-02-24 MED ORDER — KETOROLAC TROMETHAMINE 15 MG/ML IJ SOLN
15.0000 mg | Freq: Once | INTRAMUSCULAR | Status: AC
Start: 2017-02-24 — End: 2017-02-24
  Administered 2017-02-24: 15 mg via INTRAVENOUS
  Filled 2017-02-24: qty 1

## 2017-02-24 MED ORDER — NAPROXEN 500 MG PO TABS
500.0000 mg | ORAL_TABLET | Freq: Two times a day (BID) | ORAL | 0 refills | Status: DC
Start: 2017-02-24 — End: 2019-03-16

## 2017-02-24 MED ORDER — METHOCARBAMOL 500 MG PO TABS: 500.0000 mg | ORAL_TABLET | Freq: Three times a day (TID) | ORAL | 0 refills | Status: DC | PRN

## 2017-02-24 MED ORDER — BENZONATATE 100 MG PO CAPS: 100.0000 mg | ORAL_CAPSULE | Freq: Three times a day (TID) | ORAL | 0 refills | Status: DC | PRN

## 2017-02-24 MED ORDER — FLUTICASONE PROPIONATE 50 MCG/ACT NA SUSP: 1.0000 | Freq: Every day | NASAL | 2 refills | Status: DC

## 2017-02-24 MED ORDER — IOPAMIDOL (ISOVUE-300) INJECTION 61%
INTRAVENOUS | Status: AC
  Administered 2017-02-24: 100 mL
  Filled 2017-02-24: qty 100

## 2017-02-24 NOTE — ED Notes (Signed)
Pt ambulated to bathroom independently, left for [place] after.

## 2017-02-24 NOTE — ED Notes (Signed)
Patient given discharge instructions and verbalized understanding.  Patient stable to discharge at this time.  Patient is alert and oriented to baseline.  No distressed noted at this time.  All belongings taken with the patient at discharge.   

## 2017-02-24 NOTE — ED Provider Notes (Signed)
[facility] Provider Note   CSN: [id number] Arrival date & time: 02/24/17  1115     History   Chief Complaint Chief Complaint  Patient presents with  . Abdominal Pain    HPI [name] is a 57 y.o. male with a hx of DM, GERD, HTN, cholecystectomy, and nephrolithiasis who presents with complaint of intermittent RLQ abdominal pain for the past 4 days. Patient states he is having episodic pain frequently throughout the day, lasting on average 30 minutes then resolving and reoccurring. Patient describes the pain as a dull throb. Pain is worse with eating, walking, and movement, and better with laying down, states it does not bother him at night when he is sleeping. Rates the pain an 8/10 in severity at worst and at present. Patient states pain radiates to the R lower back as well as to the R testicular region. States this feels somewhat similar to previous kidney stones, but not really. Patient reports he has had some diarrhea that is non bloody. Also has has had congestion and productive cough with green sputum- cough makes pain worse. Denies fever, chills, nausea, vomiting, dysuria, frequency, or urgency. States urine has been somewhat darker- not bloody.    HPI  Past Medical History:  Diagnosis Date  . Arthritis   . Diabetes mellitus   . GERD (gastroesophageal reflux disease)   . Headache(784.0)   . Hypertension   . Kidney stones     Patient Active Problem List   Diagnosis Date Noted  . Acute biliary pancreatitis 10/23/2013  . Biliary colic 45/80/9983  . Abdominal pain, right upper quadrant 10/21/2013  . Esophageal ulcer without bleeding 08/26/2010  . HTN (hypertension) 08/26/2010  . DM type 2 (diabetes mellitus, type 2) ([place]) 08/25/2010  . Chronic pain 08/25/2010    Past Surgical History:  Procedure Laterality Date  . CARDIAC CATHETERIZATION  2009  . CHOLECYSTECTOMY N/A 10/24/2013   Procedure: LAPAROSCOPIC CHOLECYSTECTOMY;   Surgeon: Jamesetta So, MD;  Location: [facility];  Service: General;  Laterality: N/A;  . COLONOSCOPY N/A 03/31/2013   Procedure: COLONOSCOPY;  Surgeon: Rogene Houston, MD;  Location: [facility];  Service: Endoscopy;  Laterality: N/A;  730       Home Medications    Prior to Admission medications   Medication Sig Start Date End Date Taking? Authorizing Provider  acetaminophen (TYLENOL) 325 MG tablet Take 325-975 mg by mouth daily as needed for headache.     [provider]  ALPRAZolam Duanne Moron) 1 MG tablet Take 1 mg by mouth 4 (four) times daily as needed for anxiety or sleep.  02/25/15   [provider]  aspirin EC 81 MG tablet Take 81 mg by mouth daily.    [provider]  dicyclomine (BENTYL) 20 MG tablet Take one every 8 hours as needed for abd cramps Patient not taking: Reported on 09/12/2016 01/30/16   Milton Ferguson, MD  gabapentin (NEURONTIN) 100 MG capsule Take 100 mg by mouth 3 (three) times daily.    [provider]  HYDROcodone-acetaminophen (NORCO) 10-325 MG tablet Take 1 tablet by mouth every 4 (four) hours as needed. 01/25/16   [provider]  lisinopril (PRINIVIL,ZESTRIL) 40 MG tablet Take 40 mg by mouth daily.    [provider]  MATZIM LA 420 MG 24 hr tablet Take 1 tablet by mouth daily. 12/30/15   [provider]  metFORMIN (GLUCOPHAGE) 500 MG tablet Take 500-1,000 mg by mouth 3 (three) times  daily with meals. Takes a total of 2500mg  per day.    [provider]  Multiple Vitamin (MULTIVITAMIN WITH MINERALS) TABS tablet Take 1 tablet by mouth daily.    [provider]  omeprazole (PRILOSEC) 20 MG capsule Take 20 mg by mouth daily. 02/21/15   [provider]  ondansetron (ZOFRAN ODT) 4 MG disintegrating tablet 4mg  ODT q4 hours prn nausea/vomit Patient not taking: Reported on 09/12/2016 01/30/16   Milton Ferguson, MD  Specialty Vitamins Products ([employer] [facility] FORMULA) CAPS Take 1 capsule by mouth  daily.    [provider]  traMADol (ULTRAM) 50 MG tablet Take 1 tablet (50 mg total) by mouth every 6 (six) hours as needed. 09/12/16   Julianne Rice, MD    Family History Family History  Problem Relation Age of Onset  . Diabetes Mother   . Heart failure Mother   . Heart disease Mother   . Heart failure Father     Social History Social History   Tobacco Use  . Smoking status: Former Smoker    Types: Cigarettes  . Smokeless tobacco: Never Used  Substance Use Topics  . Alcohol use: No  . Drug use: No     Allergies   Patient has no known allergies.   Review of Systems Review of Systems  Constitutional: Negative for chills and fever.  HENT: Positive for congestion and rhinorrhea. Negative for sore throat.   Respiratory: Positive for cough. Negative for shortness of breath.   Cardiovascular: Negative for chest pain.  Gastrointestinal: Positive for abdominal pain and diarrhea. Negative for blood in stool, constipation, nausea and vomiting.  Genitourinary: Positive for testicular pain (R). Negative for dysuria, hematuria, scrotal swelling and urgency.       Positive for somewhat dark colored urine, has had decrease in fluids.  Musculoskeletal: Positive for back pain.  Neurological: Negative for weakness and numbness.       Negative for incontinence or saddle anesthesia  All other systems reviewed and are negative.  Physical Exam Updated Vital Signs BP (!) 183/102   Pulse 85   Temp 98.8 F (37.1 C) (Oral)   Resp 18   Ht 5\' 9"  (1.753 m)   Wt 91.6 kg (202 lb)   SpO2 99%   BMI 29.83 kg/m   Physical Exam  Constitutional: He appears well-developed and well-nourished.  Non-toxic appearance. No distress.  HENT:  Head: Normocephalic and atraumatic.  Right Ear: Tympanic membrane normal.  Left Ear: Tympanic membrane normal.  Nose: Mucosal edema present. Right sinus exhibits no maxillary sinus tenderness and no frontal sinus tenderness. Left sinus exhibits no  maxillary sinus tenderness and no frontal sinus tenderness.  Mouth/Throat: Uvula is midline and oropharynx is clear and moist.  Eyes: Conjunctivae are normal. Right eye exhibits no discharge. Left eye exhibits no discharge.  Neck: Normal range of motion. Neck supple.  Cardiovascular: Normal rate and regular rhythm.  No murmur heard. Pulmonary/Chest: Effort normal and breath sounds normal. No respiratory distress. He has no wheezes. He has no rhonchi. He has no rales.  Abdominal: Soft. He exhibits no distension. There is tenderness (RLQ ). There is no rigidity, no rebound, no guarding and no CVA tenderness.  Genitourinary: Cremasteric reflex is present. Right testis shows tenderness. Right testis shows no mass and no swelling. Left testis shows no mass, no swelling and no tenderness.  Genitourinary Comments: RN Julieta Gutting present as chaperone  Musculoskeletal:  Back: There is no overlying erythema or warmth. No midline tenderness. Right  lumbar paraspinal muscle tenderness to palpation.  Lymphadenopathy:    He has no cervical adenopathy.  Neurological: He is alert.  Clear speech. Sensation grossly intact to bilateral lower extremities. 5/5 strength with plantar/dorsi flexion bilaterally. Gait intact. Back pain reproduced with RLE straight leg raise.   Skin: Skin is warm and dry. No rash noted.  Psychiatric: He has a normal mood and affect. His behavior is normal.  Nursing note and vitals reviewed.   ED Treatments / Results  Labs Results for orders placed or performed during the hospital encounter of 02/24/17  Lipase, blood  Result Value Ref Range   Lipase 37 11 - 51 U/L  Comprehensive metabolic panel  Result Value Ref Range   Sodium 139 135 - 145 mmol/L   Potassium 3.7 3.5 - 5.1 mmol/L   Chloride 106 101 - 111 mmol/L   CO2 20 (L) 22 - 32 mmol/L   Glucose, Bld 135 (H) 65 - 99 mg/dL   BUN 12 6 - 20 mg/dL   Creatinine, Ser 0.82 0.61 - 1.24 mg/dL   Calcium 9.2 8.9 - 10.3 mg/dL     Total Protein 7.1 6.5 - 8.1 g/dL   Albumin 4.4 3.5 - 5.0 g/dL   AST 30 15 - 41 U/L   ALT 30 17 - 63 U/L   Alkaline Phosphatase 68 38 - 126 U/L   Total Bilirubin 1.0 0.3 - 1.2 mg/dL   GFR calc non Af Amer >60 >60 mL/min   GFR calc Af Amer >60 >60 mL/min   Anion gap 13 5 - 15  CBC  Result Value Ref Range   WBC 6.4 4.0 - 10.5 K/uL   RBC 5.12 4.22 - 5.81 MIL/uL   Hemoglobin 16.6 13.0 - 17.0 g/dL   HCT 47.1 39.0 - 52.0 %   MCV 92.0 78.0 - 100.0 fL   MCH 32.4 26.0 - 34.0 pg   MCHC 35.2 30.0 - 36.0 g/dL   RDW 13.8 11.5 - 15.5 %   Platelets 165 150 - 400 K/uL  Urinalysis, Routine w reflex microscopic  Result Value Ref Range   Color, Urine YELLOW YELLOW   APPearance CLEAR CLEAR   Specific Gravity, Urine 1.024 1.005 - 1.030   pH 5.0 5.0 - 8.0   Glucose, UA NEGATIVE NEGATIVE mg/dL   Hgb urine dipstick NEGATIVE NEGATIVE   Bilirubin Urine NEGATIVE NEGATIVE   Ketones, ur 5 (A) NEGATIVE mg/dL   Protein, ur 30 (A) NEGATIVE mg/dL   Nitrite NEGATIVE NEGATIVE   Leukocytes, UA NEGATIVE NEGATIVE   RBC / HPF 0-5 0 - 5 RBC/hpf   WBC, UA 0-5 0 - 5 WBC/hpf   Bacteria, UA RARE (A) NONE SEEN   Squamous Epithelial / LPF 0-5 (A) NONE SEEN   Mucus PRESENT     EKG  EKG Interpretation None      Radiology Ct Abdomen Pelvis W Contrast  Result Date: 02/24/2017 CLINICAL DATA:  Right lower quadrant pain for 3-4 days. History of a kidney stone 7 years ago. EXAM: CT ABDOMEN AND PELVIS WITH CONTRAST TECHNIQUE: Multidetector CT imaging of the abdomen and pelvis was performed using the standard protocol following bolus administration of intravenous contrast. CONTRAST:  100 mL of ISOVUE-300 IOPAMIDOL (ISOVUE-300) INJECTION 61% COMPARISON:  01/30/2016 FINDINGS: Lower chest: Clear lung bases.  Heart normal size. Hepatobiliary: No focal liver abnormality is seen. Status post cholecystectomy. No biliary dilatation. Pancreas: Unremarkable. No pancreatic ductal dilatation or surrounding inflammatory changes.  Spleen: Normal in size without focal abnormality. Adrenals/Urinary  Tract: Adrenal glands are unremarkable. Kidneys are normal, without renal calculi, focal lesion, or hydronephrosis. Bladder is unremarkable. Stomach/Bowel: Normal stomach and small bowel. No colonic dilation, wall thickening or inflammation. There are multiple diverticula mostly along the left colon. No diverticulitis. Normal appendix is visualized. Vascular/Lymphatic: Mild aortic atherosclerosis. No aneurysm. No adenopathy. Reproductive: Unremarkable. Other: No abdominal wall hernia or abnormality. No abdominopelvic ascites. Musculoskeletal: No acute fracture. 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 No osteoblastic or osteolytic lesions. Degenerative changes noted throughout the visualized spine. IMPRESSION: 1. No acute findings within the abdomen or pelvis. No findings to account for the patient's right lower quadrant pain. Normal appendix is visualized. 2. Multiple colonic diverticula mostly on the left. No diverticulitis. 3. Mild aortic atherosclerosis. Electronically Signed   By: Lajean Manes M.D.   On: 02/24/2017 20:45   US Scrotum W/doppler  Result Date: 02/24/2017 CLINICAL DATA:  Testicle pain EXAM: SCROTAL ULTRASOUND DOPPLER ULTRASOUND OF THE TESTICLES TECHNIQUE: Complete ultrasound examination of the testicles, epididymis, and other scrotal structures was performed. Color and spectral Doppler ultrasound were also utilized to evaluate blood flow to the testicles. COMPARISON:  CT 02/24/2017 FINDINGS: Right testicle Measurements: 4.1 x 1.9 x 2.9 cm. No mass. Microlithiasis is present. Left testicle Measurements: 3.9 x 2.3 x 3.1 cm. No mass. Microlithiasis is present. Right epididymis:  Normal in size and appearance. Left epididymis:  Normal in size and appearance. Hydrocele:  Trace bilateral hydrocele. Varicocele:  None visualized. Pulsed Doppler interrogation of both testes demonstrates normal low  resistance arterial and venous waveforms bilaterally. IMPRESSION: 1. Negative for testicular torsion 2. Trace bilateral hydrocele 3. Testicular microlithiasis. Current literature suggests that testicular microlithiasis is not a significant independent risk factor for development of testicular carcinoma, and that follow up imaging is not warranted in the absence of other risk factors. Monthly testicular self-examination and annual physical exams are considered appropriate surveillance. If patient has other risk factors for testicular carcinoma, then referral to Urology should be considered. (Reference: DeCastro, et al.: A 5-Year Follow up Study of Asymptomatic Men with Testicular Microlithiasis. J Urol 2008; 237:6283-1517.) Testicle pain please pick correct template. Electronically Signed   By: Donavan Foil M.D.   On: 02/24/2017 21:48    Procedures Procedures (including critical care time)  Medications Ordered in ED Medications - No data to display   Initial Impression / Assessment and Plan / ED Course  I have reviewed the triage vital signs and the nursing notes.  Pertinent labs & imaging results that were available during my care of the patient were reviewed by me and considered in my medical decision making (see chart for details).    Patient presents with R lower abdominal pain that radiates to R lower back and R testicle. Patient is nontoxic appearing, in no apparent distress, vitals are WNL other than BP which is elevated- no indication of HTN emergency, discussed with patient need for recheck by PCP. Lab work ordered in triage reviewed and unremarkable- of note there is no leukocytosis, significant electrolyte abnormality, or significant abnormality in liver or kidney function. Lipase WNL. Patient is tenderness to palpation of the R lumbar paraspinal muscles, RLQ abdomen, and R testicle. Patient's back pain is reproduced with RLE straight leg raise, no midline tenderness, no neurologic deficits  on exam, gait intact. Will evaluate with CT abdomen/pelvis and testicular US.   CT abdomen/pelvis without acute abnormality, testicular ultrasound with testicular microlithiasis and recommended monthly self exams and annual exams for  surveillance (discussed this specifically with patient), no indication of testicular torsion. Patient is nontoxic, nonseptic appearing, in no apparent distress.  Labs, imaging and vitals reviewed.  Patient does not meet the SIRS or Sepsis criteria.  On repeat exam patient does not have a surgical abdomen and there are no peritoneal signs.  No indication of appendicitis, bowel obstruction, bowel perforation, cholecystitis, diverticulitis, pancreatitis, or testicular torsion. Will treat with naproxen and robaxin- discussed patient is not to drive or operate heavy machinery while taking robaxin  Patient with additional URI complaints consistent with viral illness. Patient is afebrile and without adventitious sounds on lung exam,  doubt PNA. No wheezing on exam. Afebrile, no sinus tenderness, doubt sinusitis. Centor score 0, doubt strep pharyngitis. Suspect viral etiology, will treat supportively with Flonase and Tessalon.   I discussed results, treatment plan, need for PCP follow-up, and return precautions with the patient. Provided opportunity for questions, patient confirmed understanding and is in agreement with plan.   Final Clinical Impressions(s) / ED Diagnoses   Final diagnoses:  Abdominal pain, unspecified abdominal location  Viral illness    ED Discharge Orders        Ordered    naproxen (NAPROSYN) 500 MG tablet  2 times daily     02/24/17 2213    methocarbamol (ROBAXIN) 500 MG tablet  Every 8 hours PRN     02/24/17 2213    fluticasone (FLONASE) 50 MCG/ACT nasal spray  Daily     02/24/17 2213    benzonatate (TESSALON) 100 MG capsule  3 times daily PRN     02/24/17 2213       Amaryllis Dyke, PA-C 02/24/17 2245    Macarthur Critchley,  MD 02/26/17 1430

## 2017-02-24 NOTE — ED Triage Notes (Signed)
Pt presents with 4 day h/o R sided abdominal pain that radiates into his back; pt reports urine is darker that normal and endorses R testicular pain but denies swelling.  Pt reports productive cough with green phlegm x 2 days.

## 2017-02-24 NOTE — Discharge Instructions (Signed)
You were seen in the emergency department today for pain your abdomen, back, and testicle. Your lab work did not show any significant abnormalities.  Your CT scan of your abdomen and your ultrasound of your testicles did not reveal a cause of your pain.  As discussed the testicular ultrasound showed a small stone and recommended regular self testicular exams.   We have prescribed you multiple medications to treat your symptoms:  - Naproxen is a nonsteroidal anti-inflammatory medication that will help with pain and swelling. Be sure to take this medication as prescribed with food, 1 pill every 12 hours,  It should be taken with food, as it can cause stomach upset, and more seriously, stomach bleeding. Do not take other nonsteroidal anti-inflammatory medications with this such as Advil, Motrin, or Aleve.   - Robaxin is the muscle relaxer I have prescribed, this is meant to help with muscle tightness. Be aware that this medication may make you drowsy therefore the first time you take this it should be at a time you are in an environment where you can rest. Do not drive or operate heavy machinery when taking this medication.   -Flonase to be used 1 spray in each nostril daily.  This medication is used to treat your congestion.  -Tessalon can be taken once every 8 hours as needed.  This medication is used to treat your cough.  You will need to follow-up with your primary care provider in 3-5 days for re-evaluation. Return to the emergency department for any new or worsening symptoms including but not limited to worsening pain, blood in your stool, inability to keep down fluids, shortness of breath, chest pain, or any other concerns you may have.

## 2017-03-09 DIAGNOSIS — E291 Testicular hypofunction: Secondary | ICD-10-CM | POA: Diagnosis not present

## 2017-03-09 DIAGNOSIS — R7989 Other specified abnormal findings of blood chemistry: Secondary | ICD-10-CM | POA: Diagnosis not present

## 2017-04-13 DIAGNOSIS — R7989 Other specified abnormal findings of blood chemistry: Secondary | ICD-10-CM | POA: Diagnosis not present

## 2017-04-14 DIAGNOSIS — N181 Chronic kidney disease, stage 1: Secondary | ICD-10-CM | POA: Diagnosis not present

## 2017-04-14 DIAGNOSIS — M2550 Pain in unspecified joint: Secondary | ICD-10-CM | POA: Diagnosis not present

## 2017-04-14 DIAGNOSIS — M79604 Pain in right leg: Secondary | ICD-10-CM | POA: Diagnosis not present

## 2017-04-14 DIAGNOSIS — M79605 Pain in left leg: Secondary | ICD-10-CM | POA: Diagnosis not present

## 2017-04-14 DIAGNOSIS — I129 Hypertensive chronic kidney disease with stage 1 through stage 4 chronic kidney disease, or unspecified chronic kidney disease: Secondary | ICD-10-CM | POA: Diagnosis not present

## 2017-06-01 DIAGNOSIS — R7989 Other specified abnormal findings of blood chemistry: Secondary | ICD-10-CM | POA: Diagnosis not present

## 2017-07-06 DIAGNOSIS — R7989 Other specified abnormal findings of blood chemistry: Secondary | ICD-10-CM | POA: Diagnosis not present

## 2017-07-06 DIAGNOSIS — Z79899 Other long term (current) drug therapy: Secondary | ICD-10-CM | POA: Diagnosis not present

## 2017-07-20 DIAGNOSIS — R7989 Other specified abnormal findings of blood chemistry: Secondary | ICD-10-CM | POA: Diagnosis not present

## 2017-07-27 DIAGNOSIS — E1129 Type 2 diabetes mellitus with other diabetic kidney complication: Secondary | ICD-10-CM | POA: Diagnosis not present

## 2017-07-27 DIAGNOSIS — Z1389 Encounter for screening for other disorder: Secondary | ICD-10-CM | POA: Diagnosis not present

## 2017-07-27 DIAGNOSIS — G894 Chronic pain syndrome: Secondary | ICD-10-CM | POA: Diagnosis not present

## 2017-07-27 DIAGNOSIS — M2550 Pain in unspecified joint: Secondary | ICD-10-CM | POA: Diagnosis not present

## 2017-07-27 DIAGNOSIS — W57XXXA Bitten or stung by nonvenomous insect and other nonvenomous arthropods, initial encounter: Secondary | ICD-10-CM | POA: Diagnosis not present

## 2017-07-27 DIAGNOSIS — I10 Essential (primary) hypertension: Secondary | ICD-10-CM | POA: Diagnosis not present

## 2017-07-27 DIAGNOSIS — E119 Type 2 diabetes mellitus without complications: Secondary | ICD-10-CM | POA: Diagnosis not present

## 2017-08-03 DIAGNOSIS — R7989 Other specified abnormal findings of blood chemistry: Secondary | ICD-10-CM | POA: Diagnosis not present

## 2017-08-17 DIAGNOSIS — R7989 Other specified abnormal findings of blood chemistry: Secondary | ICD-10-CM | POA: Diagnosis not present

## 2017-09-16 DIAGNOSIS — Z1389 Encounter for screening for other disorder: Secondary | ICD-10-CM | POA: Diagnosis not present

## 2017-09-16 DIAGNOSIS — R631 Polydipsia: Secondary | ICD-10-CM | POA: Diagnosis not present

## 2017-09-16 DIAGNOSIS — E1165 Type 2 diabetes mellitus with hyperglycemia: Secondary | ICD-10-CM | POA: Diagnosis not present

## 2017-09-16 DIAGNOSIS — Z6829 Body mass index (BMI) 29.0-29.9, adult: Secondary | ICD-10-CM | POA: Diagnosis not present

## 2017-09-16 DIAGNOSIS — I10 Essential (primary) hypertension: Secondary | ICD-10-CM | POA: Diagnosis not present

## 2017-09-16 DIAGNOSIS — R358 Other polyuria: Secondary | ICD-10-CM | POA: Diagnosis not present

## 2017-09-16 DIAGNOSIS — E663 Overweight: Secondary | ICD-10-CM | POA: Diagnosis not present

## 2017-10-26 DIAGNOSIS — Z6829 Body mass index (BMI) 29.0-29.9, adult: Secondary | ICD-10-CM | POA: Diagnosis not present

## 2017-10-26 DIAGNOSIS — E663 Overweight: Secondary | ICD-10-CM | POA: Diagnosis not present

## 2017-10-26 DIAGNOSIS — M25512 Pain in left shoulder: Secondary | ICD-10-CM | POA: Diagnosis not present

## 2017-10-26 DIAGNOSIS — Z23 Encounter for immunization: Secondary | ICD-10-CM | POA: Diagnosis not present

## 2017-10-26 DIAGNOSIS — Z1389 Encounter for screening for other disorder: Secondary | ICD-10-CM | POA: Diagnosis not present

## 2017-11-04 DIAGNOSIS — E663 Overweight: Secondary | ICD-10-CM | POA: Diagnosis not present

## 2017-11-04 DIAGNOSIS — E119 Type 2 diabetes mellitus without complications: Secondary | ICD-10-CM | POA: Diagnosis not present

## 2017-11-04 DIAGNOSIS — G894 Chronic pain syndrome: Secondary | ICD-10-CM | POA: Diagnosis not present

## 2017-11-04 DIAGNOSIS — M5481 Occipital neuralgia: Secondary | ICD-10-CM | POA: Diagnosis not present

## 2017-11-04 DIAGNOSIS — Z1389 Encounter for screening for other disorder: Secondary | ICD-10-CM | POA: Diagnosis not present

## 2017-11-04 DIAGNOSIS — I10 Essential (primary) hypertension: Secondary | ICD-10-CM | POA: Diagnosis not present

## 2017-11-04 DIAGNOSIS — R58 Hemorrhage, not elsewhere classified: Secondary | ICD-10-CM | POA: Diagnosis not present

## 2017-11-23 DIAGNOSIS — S46912A Strain of unspecified muscle, fascia and tendon at shoulder and upper arm level, left arm, initial encounter: Secondary | ICD-10-CM | POA: Diagnosis not present

## 2017-11-23 DIAGNOSIS — M25512 Pain in left shoulder: Secondary | ICD-10-CM | POA: Diagnosis not present

## 2018-01-05 DIAGNOSIS — Z6830 Body mass index (BMI) 30.0-30.9, adult: Secondary | ICD-10-CM | POA: Diagnosis not present

## 2018-01-05 DIAGNOSIS — G894 Chronic pain syndrome: Secondary | ICD-10-CM | POA: Diagnosis not present

## 2018-01-05 DIAGNOSIS — R319 Hematuria, unspecified: Secondary | ICD-10-CM | POA: Diagnosis not present

## 2018-01-05 DIAGNOSIS — I10 Essential (primary) hypertension: Secondary | ICD-10-CM | POA: Diagnosis not present

## 2018-01-05 DIAGNOSIS — Z0001 Encounter for general adult medical examination with abnormal findings: Secondary | ICD-10-CM | POA: Diagnosis not present

## 2018-01-05 DIAGNOSIS — E6609 Other obesity due to excess calories: Secondary | ICD-10-CM | POA: Diagnosis not present

## 2018-01-05 DIAGNOSIS — N419 Inflammatory disease of prostate, unspecified: Secondary | ICD-10-CM | POA: Diagnosis not present

## 2018-01-05 DIAGNOSIS — Z1389 Encounter for screening for other disorder: Secondary | ICD-10-CM | POA: Diagnosis not present

## 2018-01-07 ENCOUNTER — Encounter (HOSPITAL_COMMUNITY): Payer: Self-pay | Admitting: Emergency Medicine

## 2018-01-07 ENCOUNTER — Emergency Department (HOSPITAL_COMMUNITY)
Admission: EM | Admit: 2018-01-07 | Discharge: 2018-01-07 | Disposition: A | Discharge status: Home / Self Care | Payer: Commercial Managed Care - PPO | Attending: Emergency Medicine | Admitting: Emergency Medicine

## 2018-01-07 ENCOUNTER — Other Ambulatory Visit: Payer: Self-pay

## 2018-01-07 ENCOUNTER — Emergency Department (HOSPITAL_COMMUNITY): Payer: Commercial Managed Care - PPO

## 2018-01-07 DIAGNOSIS — I10 Essential (primary) hypertension: Secondary | ICD-10-CM | POA: Insufficient documentation

## 2018-01-07 DIAGNOSIS — H5711 Ocular pain, right eye: Secondary | ICD-10-CM | POA: Insufficient documentation

## 2018-01-07 DIAGNOSIS — Z79899 Other long term (current) drug therapy: Secondary | ICD-10-CM | POA: Diagnosis not present

## 2018-01-07 DIAGNOSIS — Z87891 Personal history of nicotine dependence: Secondary | ICD-10-CM | POA: Insufficient documentation

## 2018-01-07 DIAGNOSIS — H538 Other visual disturbances: Secondary | ICD-10-CM | POA: Diagnosis present

## 2018-01-07 DIAGNOSIS — Z7982 Long term (current) use of aspirin: Secondary | ICD-10-CM | POA: Diagnosis not present

## 2018-01-07 DIAGNOSIS — E119 Type 2 diabetes mellitus without complications: Secondary | ICD-10-CM | POA: Diagnosis not present

## 2018-01-07 LAB — SEDIMENTATION RATE: Sed Rate: 2 mm/hr (ref 0–16)

## 2018-01-07 MED ORDER — ERYTHROMYCIN 5 MG/GM OP OINT
1.0000 "application " | TOPICAL_OINTMENT | Freq: Once | OPHTHALMIC | Status: AC
  Administered 2018-01-07: 1 via OPHTHALMIC
  Filled 2018-01-07: qty 3.5

## 2018-01-07 MED ORDER — TETRACAINE HCL 0.5 % OP SOLN
2.0000 [drp] | Freq: Once | OPHTHALMIC | Status: AC
  Administered 2018-01-07: 2 [drp] via OPHTHALMIC
  Filled 2018-01-07: qty 4

## 2018-01-07 MED ORDER — FLUORESCEIN SODIUM 1 MG OP STRP
1.0000 | ORAL_STRIP | Freq: Once | OPHTHALMIC | Status: AC
  Administered 2018-01-07: 1 via OPHTHALMIC
  Filled 2018-01-07: qty 1

## 2018-01-07 NOTE — ED Provider Notes (Signed)
[facility] EMERGENCY DEPARTMENT Provider Note   CSN: [id number] Arrival date & time: 01/07/18  1640     History   Chief Complaint Chief Complaint  Patient presents with  . Eye Pain    HPI [name] is a 57 y.o. male.  HPI Patient presents with concern of vision changes and eye pain. Patient awoke this morning about 10 hours prior to my evaluation with scratchy pain in his right eye, diminished visual capacity, described as looking through water. 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  Past Medical History:  Diagnosis Date  . Arthritis   . Diabetes mellitus   . GERD (gastroesophageal reflux disease)   . Headache(784.0)   . Hypertension   . Kidney stones     Patient Active Problem List   Diagnosis Date Noted  . Acute biliary pancreatitis 10/23/2013  . Biliary colic 97/41/6384  . Abdominal pain, right upper quadrant 10/21/2013  . Esophageal ulcer without bleeding 08/26/2010  . HTN (hypertension) 08/26/2010  . DM type 2 (diabetes mellitus, type 2) ([place]) 08/25/2010  . Chronic pain 08/25/2010    Past Surgical History:  Procedure Laterality Date  . CARDIAC CATHETERIZATION  2009  . CHOLECYSTECTOMY N/A 10/24/2013   Procedure: LAPAROSCOPIC CHOLECYSTECTOMY;  Surgeon: Jamesetta So, MD;  Location: [facility] ORS;  Service: General;  Laterality: N/A;  . COLONOSCOPY N/A 03/31/2013   Procedure: COLONOSCOPY;  Surgeon: Rogene Houston, MD;  Location: [facility] ENDO SUITE;  Service: Endoscopy;  Laterality: N/A;  730        Home Medications    Prior to Admission medications   Medication Sig Start Date End Date Taking? Authorizing Provider  acetaminophen (TYLENOL) 325 MG tablet Take 325-975 mg by  mouth daily as needed for headache.     [provider]  ALPRAZolam Duanne Moron) 1 MG tablet Take 1 mg by mouth 4 (four) times daily as needed for anxiety or sleep.  02/25/15   [provider]  aspirin EC 81 MG tablet Take 81 mg by mouth daily.    [provider]  benzonatate (TESSALON) 100 MG capsule Take 1 capsule (100 mg total) by mouth 3 (three) times daily as needed for cough. 02/24/17   Petrucelli, Aldona Bar R, PA-C  dicyclomine (BENTYL) 20 MG tablet Take one every 8 hours as needed for abd cramps 01/30/16   Milton Ferguson, MD  fluticasone [facility]) 50 MCG/ACT nasal spray Place 1 spray into both nostrils daily. 02/24/17   Petrucelli, Samantha R, PA-C  gabapentin (NEURONTIN) 100 MG capsule Take 100 mg by mouth 3 (three) times daily.    [provider]  HYDROcodone-acetaminophen (NORCO) 10-325 MG tablet Take 1 tablet by mouth every 4 (four) hours as needed for moderate pain.  01/25/16   [provider]  hydroxypropyl methylcellulose / hypromellose (ISOPTO TEARS / GONIOVISC) 2.5 % ophthalmic solution Place 1 drop into both eyes 3 (three) times daily as needed for dry eyes.    [provider]  lisinopril (PRINIVIL,ZESTRIL) 40 MG tablet Take 40 mg by mouth daily.    [provider]  MATZIM LA 420 MG 24 hr tablet Take 1 tablet by mouth daily. 12/30/15   [provider]  metFORMIN (GLUCOPHAGE) 500 MG tablet Take  2,000 mg by mouth every morning.     [provider]  methocarbamol (ROBAXIN) 500 MG tablet Take 1 tablet (500 mg total) by mouth every 8 (eight) hours as needed for muscle spasms. 02/24/17   Petrucelli, Samantha R, PA-C  Multiple Vitamin (MULTIVITAMIN WITH MINERALS) TABS tablet Take 1 tablet by mouth daily.    [provider]  naproxen (NAPROSYN) 500 MG tablet Take 1 tablet (500 mg total) by mouth 2 (two) times daily. 02/24/17   Petrucelli, Samantha R, PA-C  omeprazole (PRILOSEC) 20 MG capsule Take 20 mg by mouth daily.  02/21/15   [provider]  ondansetron (ZOFRAN ODT) 4 MG disintegrating tablet 4mg  ODT q4 hours prn nausea/vomit 01/30/16   Milton Ferguson, MD  Specialty Vitamins Products ([employer] [facility] FORMULA) CAPS Take 1 capsule by mouth daily.    [provider]    Family History Family History  Problem Relation Age of Onset  . Diabetes Mother   . Heart failure Mother   . Heart disease Mother   . Heart failure Father     Social History Social History   Tobacco Use  . Smoking status: Former Smoker    Types: Cigarettes  . Smokeless tobacco: Never Used  Substance Use Topics  . Alcohol use: No  . Drug use: No     Allergies   Patient has no known allergies.   Review of Systems Review of Systems  Constitutional:       Per HPI, otherwise negative  HENT:       Per HPI, otherwise negative  Eyes: Positive for photophobia, pain, discharge, itching and visual disturbance.  Respiratory:       Per HPI, otherwise negative  Cardiovascular:       Per HPI, otherwise negative  Gastrointestinal: Negative for vomiting.  Endocrine:       Negative aside from HPI  Genitourinary:       Neg aside from HPI   Musculoskeletal:       Per HPI, otherwise negative  Skin: Negative.   Neurological: Negative for syncope.     Physical Exam Updated Vital Signs BP (!) 158/89 (BP Location: Left Arm)   Pulse 81   Temp 98.4 F (36.9 C) (Oral)   Resp 16   Ht 5\' 9"  (1.753 m)   Wt 92.5 kg   SpO2 97%   BMI 30.13 kg/m   Physical Exam Vitals signs and nursing note reviewed.  Constitutional:      General: He is not in acute distress.    Appearance: He is well-developed.  HENT:     Head: Normocephalic and atraumatic.  Eyes:     General: Lids are normal. Visual field deficit present. No scleral icterus.       Right eye: Discharge present. No foreign body or hordeolum.        Left eye: No foreign body, discharge or hordeolum.     Intraocular pressure: Right eye pressure is 8 mmHg.  Measurements were taken using a handheld tonometer.    Extraocular Movements: Extraocular movements intact.     Right eye: No nystagmus.     Left eye: No nystagmus.     Conjunctiva/sclera:     Right eye: Right conjunctiva is injected. No chemosis, exudate or hemorrhage.    Pupils: Pupils are equal, round, and reactive to light.     Right eye: Pupil is reactive. Corneal abrasion and fluorescein uptake present.     Slit lamp exam:    Right eye:  Photophobia present. No corneal ulcer, foreign body, hyphema, hypopyon, anterior chamber bulge or anterior chamber flares.  Cardiovascular:     Rate and Rhythm: Normal rate and regular rhythm.  Pulmonary:     Effort: Pulmonary effort is normal. No respiratory distress.     Breath sounds: No stridor.  Abdominal:     General: There is no distension.  Skin:    General: Skin is warm and dry.  Neurological:     Mental Status: He is alert and oriented to person, place, and time.      ED Treatments / Results  Labs (all labs ordered are listed, but only abnormal results are displayed) Labs Reviewed  SEDIMENTATION RATE     Radiology Ct Head Wo Contrast  Result Date: 01/07/2018 CLINICAL DATA:  Right eye pain and blurriness. EXAM: CT HEAD WITHOUT CONTRAST TECHNIQUE: Contiguous axial images were obtained from the base of the skull through the vertex without intravenous contrast. COMPARISON:  CT head dated March 13, 2015. FINDINGS: Brain: No evidence of acute infarction, hemorrhage, hydrocephalus, extra-axial collection or mass lesion/mass effect. Vascular: Atherosclerotic vascular calcification of the carotid siphons. No hyperdense vessel. Skull: Normal. Negative for fracture or focal lesion. Sinuses/Orbits: No acute finding.  No orbital inflammatory changes. Other: None. IMPRESSION: 1. Normal noncontrast head CT. Electronically Signed   By: Titus Dubin M.D.   On: 01/07/2018 21:35    Procedures Procedures (including critical care  time)  Medications Ordered in ED Medications  fluorescein ophthalmic strip 1 strip (1 strip Right Eye Given by Other 01/07/18 1858)  tetracaine (PONTOCAINE) 0.5 % ophthalmic solution 2 drop (2 drops Right Eye Given by Other 01/07/18 1858)  erythromycin ophthalmic ointment 1 application (1 application Right Eye Given 01/07/18 2206)     Initial Impression / Assessment and Plan / ED Course  I have reviewed the triage vital signs and the nursing notes.  Pertinent labs & imaging results that were available during my care of the patient were reviewed by me and considered in my medical decision making (see chart for details).  After funduscopic exam, the patient continued to have eye pain, in spite of topical analgesia. With temporal pain, description of pain with eye motion, the patient will CT scan to exclude mass.  Update:, CT unremarkable, sed rate normal, low suspicion for giant cell arteritis. Patient's visual acuity remains abnormal. I discussed his case with our ophthalmologist who will see the patient in 10 hours.  This male presents with new vision changes, pain Findings most consistent with corneal abrasion versus iritis, no evidence for retinal detachment on bedside ultrasound, no evidence for mass on CT. Given his acuity changes, patient will follow-up with ophthalmology within 12 hours.  Final Clinical Impressions(s) / ED Diagnoses   Final diagnoses:  Pain of right eye      Carmin Muskrat, MD 01/07/18 2238

## 2018-01-07 NOTE — ED Notes (Signed)
Pt taken to CT.

## 2018-01-07 NOTE — ED Triage Notes (Signed)
Patient complains of right eye pain and blurriness. Patient states that he woke up this morning and had right eye blurriness. Patient state right eye is burning with pain that radiates to temple.

## 2018-01-07 NOTE — Discharge Instructions (Addendum)
As discussed, your eye pain and vision changes are likely due to irritation of the lining of the surface of your eye. It is very important that you keep your appointment tomorrow morning with our ophthalmologist.  Return here for concerning changes in your condition.

## 2018-01-08 DIAGNOSIS — E119 Type 2 diabetes mellitus without complications: Secondary | ICD-10-CM | POA: Diagnosis not present

## 2018-01-08 DIAGNOSIS — S0501XA Injury of conjunctiva and corneal abrasion without foreign body, right eye, initial encounter: Secondary | ICD-10-CM | POA: Diagnosis not present

## 2018-01-15 DIAGNOSIS — S0501XD Injury of conjunctiva and corneal abrasion without foreign body, right eye, subsequent encounter: Secondary | ICD-10-CM | POA: Diagnosis not present

## 2018-02-15 DIAGNOSIS — G894 Chronic pain syndrome: Secondary | ICD-10-CM | POA: Diagnosis not present

## 2018-02-15 DIAGNOSIS — E1140 Type 2 diabetes mellitus with diabetic neuropathy, unspecified: Secondary | ICD-10-CM | POA: Diagnosis not present

## 2018-02-15 DIAGNOSIS — Z1389 Encounter for screening for other disorder: Secondary | ICD-10-CM | POA: Diagnosis not present

## 2018-02-15 DIAGNOSIS — M5136 Other intervertebral disc degeneration, lumbar region: Secondary | ICD-10-CM | POA: Diagnosis not present

## 2018-02-15 DIAGNOSIS — M62830 Muscle spasm of back: Secondary | ICD-10-CM | POA: Diagnosis not present

## 2018-02-15 DIAGNOSIS — I10 Essential (primary) hypertension: Secondary | ICD-10-CM | POA: Diagnosis not present

## 2018-02-24 DIAGNOSIS — M48061 Spinal stenosis, lumbar region without neurogenic claudication: Secondary | ICD-10-CM | POA: Diagnosis not present

## 2018-02-24 DIAGNOSIS — M5127 Other intervertebral disc displacement, lumbosacral region: Secondary | ICD-10-CM | POA: Diagnosis not present

## 2018-02-24 DIAGNOSIS — M4316 Spondylolisthesis, lumbar region: Secondary | ICD-10-CM | POA: Diagnosis not present

## 2018-02-24 DIAGNOSIS — M5126 Other intervertebral disc displacement, lumbar region: Secondary | ICD-10-CM | POA: Diagnosis not present

## 2018-02-24 DIAGNOSIS — M438X7 Other specified deforming dorsopathies, lumbosacral region: Secondary | ICD-10-CM | POA: Diagnosis not present

## 2018-03-09 DIAGNOSIS — Z6829 Body mass index (BMI) 29.0-29.9, adult: Secondary | ICD-10-CM | POA: Diagnosis not present

## 2018-03-09 DIAGNOSIS — M48061 Spinal stenosis, lumbar region without neurogenic claudication: Secondary | ICD-10-CM | POA: Diagnosis not present

## 2018-03-10 DIAGNOSIS — M48061 Spinal stenosis, lumbar region without neurogenic claudication: Secondary | ICD-10-CM | POA: Diagnosis present

## 2018-03-16 DIAGNOSIS — M48061 Spinal stenosis, lumbar region without neurogenic claudication: Secondary | ICD-10-CM | POA: Diagnosis not present

## 2018-03-16 DIAGNOSIS — Z01818 Encounter for other preprocedural examination: Secondary | ICD-10-CM | POA: Diagnosis not present

## 2018-03-16 DIAGNOSIS — E1140 Type 2 diabetes mellitus with diabetic neuropathy, unspecified: Secondary | ICD-10-CM | POA: Diagnosis not present

## 2018-03-16 DIAGNOSIS — I10 Essential (primary) hypertension: Secondary | ICD-10-CM | POA: Diagnosis not present

## 2018-03-17 DIAGNOSIS — E7800 Pure hypercholesterolemia, unspecified: Secondary | ICD-10-CM | POA: Diagnosis not present

## 2018-03-17 DIAGNOSIS — E119 Type 2 diabetes mellitus without complications: Secondary | ICD-10-CM | POA: Diagnosis not present

## 2018-03-17 DIAGNOSIS — M48061 Spinal stenosis, lumbar region without neurogenic claudication: Secondary | ICD-10-CM | POA: Diagnosis not present

## 2018-03-17 DIAGNOSIS — M47816 Spondylosis without myelopathy or radiculopathy, lumbar region: Secondary | ICD-10-CM | POA: Diagnosis not present

## 2018-03-17 DIAGNOSIS — I10 Essential (primary) hypertension: Secondary | ICD-10-CM | POA: Diagnosis not present

## 2018-03-17 DIAGNOSIS — E1140 Type 2 diabetes mellitus with diabetic neuropathy, unspecified: Secondary | ICD-10-CM | POA: Diagnosis not present

## 2018-03-18 ENCOUNTER — Encounter (INDEPENDENT_AMBULATORY_CARE_PROVIDER_SITE_OTHER): Payer: Self-pay | Admitting: *Deleted

## 2018-03-18 DIAGNOSIS — E119 Type 2 diabetes mellitus without complications: Secondary | ICD-10-CM | POA: Diagnosis not present

## 2018-03-18 DIAGNOSIS — E1140 Type 2 diabetes mellitus with diabetic neuropathy, unspecified: Secondary | ICD-10-CM | POA: Diagnosis not present

## 2018-03-18 DIAGNOSIS — M48061 Spinal stenosis, lumbar region without neurogenic claudication: Secondary | ICD-10-CM | POA: Diagnosis not present

## 2018-03-18 DIAGNOSIS — Z4889 Encounter for other specified surgical aftercare: Secondary | ICD-10-CM | POA: Diagnosis not present

## 2018-03-18 DIAGNOSIS — I10 Essential (primary) hypertension: Secondary | ICD-10-CM | POA: Diagnosis not present

## 2018-03-29 DIAGNOSIS — I10 Essential (primary) hypertension: Secondary | ICD-10-CM | POA: Diagnosis not present

## 2018-03-29 DIAGNOSIS — Z1389 Encounter for screening for other disorder: Secondary | ICD-10-CM | POA: Diagnosis not present

## 2018-03-29 DIAGNOSIS — E663 Overweight: Secondary | ICD-10-CM | POA: Diagnosis not present

## 2018-03-29 DIAGNOSIS — Z6829 Body mass index (BMI) 29.0-29.9, adult: Secondary | ICD-10-CM | POA: Diagnosis not present

## 2018-03-29 DIAGNOSIS — L03032 Cellulitis of left toe: Secondary | ICD-10-CM | POA: Diagnosis not present

## 2018-04-06 DIAGNOSIS — E1140 Type 2 diabetes mellitus with diabetic neuropathy, unspecified: Secondary | ICD-10-CM | POA: Diagnosis not present

## 2018-04-06 DIAGNOSIS — L89892 Pressure ulcer of other site, stage 2: Secondary | ICD-10-CM | POA: Diagnosis not present

## 2018-04-06 DIAGNOSIS — I739 Peripheral vascular disease, unspecified: Secondary | ICD-10-CM | POA: Diagnosis not present

## 2018-05-11 DIAGNOSIS — M5417 Radiculopathy, lumbosacral region: Secondary | ICD-10-CM | POA: Diagnosis not present

## 2018-05-11 DIAGNOSIS — Z1389 Encounter for screening for other disorder: Secondary | ICD-10-CM | POA: Diagnosis not present

## 2018-05-11 DIAGNOSIS — E119 Type 2 diabetes mellitus without complications: Secondary | ICD-10-CM | POA: Diagnosis not present

## 2018-05-11 DIAGNOSIS — G894 Chronic pain syndrome: Secondary | ICD-10-CM | POA: Diagnosis not present

## 2018-05-11 DIAGNOSIS — E669 Obesity, unspecified: Secondary | ICD-10-CM | POA: Diagnosis not present

## 2018-07-26 ENCOUNTER — Other Ambulatory Visit: Payer: Self-pay | Admitting: Internal Medicine

## 2018-07-26 ENCOUNTER — Other Ambulatory Visit (HOSPITAL_COMMUNITY): Payer: Self-pay | Admitting: Internal Medicine

## 2018-07-26 DIAGNOSIS — E13621 Other specified diabetes mellitus with foot ulcer: Secondary | ICD-10-CM

## 2018-07-28 ENCOUNTER — Other Ambulatory Visit (HOSPITAL_COMMUNITY): Payer: Self-pay | Admitting: Internal Medicine

## 2018-07-28 DIAGNOSIS — L97509 Non-pressure chronic ulcer of other part of unspecified foot with unspecified severity: Secondary | ICD-10-CM

## 2018-07-28 DIAGNOSIS — E11621 Type 2 diabetes mellitus with foot ulcer: Secondary | ICD-10-CM

## 2018-07-30 ENCOUNTER — Ambulatory Visit (HOSPITAL_COMMUNITY): Admission: RE | Admit: 2018-07-30 | Payer: Self-pay | Source: Ambulatory Visit

## 2018-07-30 ENCOUNTER — Encounter (HOSPITAL_COMMUNITY): Payer: Self-pay

## 2018-08-05 ENCOUNTER — Other Ambulatory Visit: Payer: Self-pay | Admitting: Neurosurgery

## 2018-08-05 DIAGNOSIS — M48061 Spinal stenosis, lumbar region without neurogenic claudication: Secondary | ICD-10-CM

## 2018-08-12 ENCOUNTER — Ambulatory Visit
Admission: RE | Admit: 2018-08-12 | Discharge: 2018-08-12 | Disposition: A | Discharge status: Home / Self Care | Payer: Self-pay | Source: Ambulatory Visit | Attending: Neurosurgery | Admitting: Neurosurgery

## 2018-08-12 ENCOUNTER — Other Ambulatory Visit: Payer: Self-pay | Admitting: Neurosurgery

## 2018-08-12 DIAGNOSIS — M48061 Spinal stenosis, lumbar region without neurogenic claudication: Secondary | ICD-10-CM

## 2018-08-12 MED ORDER — IOPAMIDOL (ISOVUE-M 200) INJECTION 41%
1.0000 mL | Freq: Once | INTRAMUSCULAR | Status: AC
  Administered 2018-08-12: 15:00:00 1 mL via EPIDURAL

## 2018-08-12 MED ORDER — METHYLPREDNISOLONE ACETATE 40 MG/ML INJ SUSP (RADIOLOG
120.0000 mg | Freq: Once | INTRAMUSCULAR | Status: AC
  Administered 2018-08-12: 120 mg via EPIDURAL

## 2018-08-12 NOTE — Discharge Instructions (Signed)

## 2018-08-30 ENCOUNTER — Other Ambulatory Visit: Payer: Self-pay | Admitting: Neurosurgery

## 2018-08-30 DIAGNOSIS — M48061 Spinal stenosis, lumbar region without neurogenic claudication: Secondary | ICD-10-CM

## 2018-09-02 ENCOUNTER — Other Ambulatory Visit: Payer: Self-pay

## 2018-09-02 ENCOUNTER — Other Ambulatory Visit: Payer: Self-pay | Admitting: Neurosurgery

## 2018-09-02 ENCOUNTER — Ambulatory Visit
Admission: RE | Admit: 2018-09-02 | Discharge: 2018-09-02 | Disposition: A | Discharge status: Home / Self Care | Payer: Self-pay | Source: Ambulatory Visit | Attending: Neurosurgery | Admitting: Neurosurgery

## 2018-09-02 DIAGNOSIS — M48061 Spinal stenosis, lumbar region without neurogenic claudication: Secondary | ICD-10-CM

## 2018-09-02 MED ORDER — METHYLPREDNISOLONE ACETATE 40 MG/ML INJ SUSP (RADIOLOG: 120.0000 mg | Freq: Once | INTRAMUSCULAR | Status: DC

## 2018-09-02 MED ORDER — IOPAMIDOL (ISOVUE-M 200) INJECTION 41%: 1.0000 mL | Freq: Once | INTRAMUSCULAR | Status: DC

## 2018-09-02 NOTE — Discharge Instructions (Signed)

## 2019-03-16 ENCOUNTER — Encounter (HOSPITAL_COMMUNITY): Payer: Self-pay | Admitting: Emergency Medicine

## 2019-03-16 ENCOUNTER — Emergency Department (HOSPITAL_COMMUNITY)
Admission: EM | Admit: 2019-03-16 | Discharge: 2019-03-16 | Disposition: A | Discharge status: Home / Self Care | Payer: Medicaid Other | Attending: Emergency Medicine | Admitting: Emergency Medicine

## 2019-03-16 ENCOUNTER — Other Ambulatory Visit: Payer: Self-pay

## 2019-03-16 ENCOUNTER — Emergency Department (HOSPITAL_COMMUNITY): Payer: Medicaid Other

## 2019-03-16 DIAGNOSIS — I10 Essential (primary) hypertension: Secondary | ICD-10-CM | POA: Insufficient documentation

## 2019-03-16 DIAGNOSIS — Z7982 Long term (current) use of aspirin: Secondary | ICD-10-CM | POA: Insufficient documentation

## 2019-03-16 DIAGNOSIS — Z87891 Personal history of nicotine dependence: Secondary | ICD-10-CM | POA: Insufficient documentation

## 2019-03-16 DIAGNOSIS — E1165 Type 2 diabetes mellitus with hyperglycemia: Secondary | ICD-10-CM | POA: Diagnosis not present

## 2019-03-16 DIAGNOSIS — Z794 Long term (current) use of insulin: Secondary | ICD-10-CM | POA: Diagnosis not present

## 2019-03-16 DIAGNOSIS — Z79899 Other long term (current) drug therapy: Secondary | ICD-10-CM | POA: Insufficient documentation

## 2019-03-16 DIAGNOSIS — R079 Chest pain, unspecified: Secondary | ICD-10-CM | POA: Diagnosis present

## 2019-03-16 DIAGNOSIS — R739 Hyperglycemia, unspecified: Secondary | ICD-10-CM

## 2019-03-16 LAB — BASIC METABOLIC PANEL
Anion gap: 12 (ref 5–15)
BUN: 18 mg/dL (ref 6–20)
CO2: 22 mmol/L (ref 22–32)
Calcium: 8.9 mg/dL (ref 8.9–10.3)
Chloride: 105 mmol/L (ref 98–111)
Creatinine, Ser: 1.04 mg/dL (ref 0.61–1.24)
GFR calc Af Amer: >60 mL/min (ref >60)
GFR calc non Af Amer: >60 mL/min (ref >60)
Glucose, Bld: 318 mg/dL — ABNORMAL HIGH (ref 70–99)
Potassium: 3.7 mmol/L (ref 3.5–5.1)
Sodium: 139 mmol/L (ref 135–145)

## 2019-03-16 LAB — CBC
HCT: 44.2 % (ref 39.0–52.0)
Hemoglobin: 15.3 g/dL (ref 13.0–17.0)
MCH: 31.7 pg (ref 26.0–34.0)
MCHC: 34.6 g/dL (ref 30.0–36.0)
MCV: 91.5 fL (ref 80.0–100.0)
Platelets: 193 10*3/uL (ref 150–400)
RBC: 4.83 MIL/uL (ref 4.22–5.81)
RDW: 13.2 % (ref 11.5–15.5)
WBC: 6.5 10*3/uL (ref 4.0–10.5)
nRBC: 0 % (ref 0.0–0.2)

## 2019-03-16 LAB — LIPASE, BLOOD: Lipase: 36 U/L (ref 11–51)

## 2019-03-16 LAB — CBG MONITORING, ED: Glucose-Capillary: 188 mg/dL — ABNORMAL HIGH (ref 70–99)

## 2019-03-16 LAB — TROPONIN I (HIGH SENSITIVITY)
Troponin I (High Sensitivity): 5 ng/L (ref <18)
Troponin I (High Sensitivity): 6 ng/L (ref <18)

## 2019-03-16 MED ORDER — ASPIRIN 81 MG PO CHEW
324.0000 mg | CHEWABLE_TABLET | Freq: Once | ORAL | Status: AC
  Administered 2019-03-16: 324 mg via ORAL
  Filled 2019-03-16: qty 4

## 2019-03-16 MED ORDER — FAMOTIDINE 20 MG PO TABS
20.0000 mg | ORAL_TABLET | Freq: Once | ORAL | Status: AC
  Administered 2019-03-16: 20 mg via ORAL
  Filled 2019-03-16: qty 1

## 2019-03-16 MED ORDER — ALUM & MAG HYDROXIDE-SIMETH 200-200-20 MG/5ML PO SUSP
30.0000 mL | Freq: Once | ORAL | Status: AC
  Administered 2019-03-16: 30 mL via ORAL
  Filled 2019-03-16: qty 30

## 2019-03-16 MED ORDER — ALUM & MAG HYDROXIDE-SIMETH 200-200-20 MG/5ML PO SUSP: 30.0000 mL | Freq: Once | ORAL | Status: DC

## 2019-03-16 MED ORDER — INSULIN ASPART 100 UNIT/ML ~~LOC~~ SOLN
8.0000 [IU] | Freq: Once | SUBCUTANEOUS | Status: AC
  Administered 2019-03-16: 8 [IU] via SUBCUTANEOUS
  Filled 2019-03-16: qty 1

## 2019-03-16 MED ORDER — ACETAMINOPHEN 500 MG PO TABS
1000.0000 mg | ORAL_TABLET | Freq: Once | ORAL | Status: AC
  Administered 2019-03-16: 1000 mg via ORAL
  Filled 2019-03-16: qty 2

## 2019-03-16 MED ORDER — SODIUM CHLORIDE 0.9 % IV BOLUS: 1000.0000 mL | Freq: Once | INTRAVENOUS | Status: DC

## 2019-03-16 MED ORDER — SODIUM CHLORIDE 0.9% FLUSH: 3.0000 mL | Freq: Once | INTRAVENOUS | Status: DC

## 2019-03-16 MED ORDER — NITROGLYCERIN 0.4 MG SL SUBL
0.4000 mg | SUBLINGUAL_TABLET | SUBLINGUAL | Status: DC | PRN
  Administered 2019-03-16: 15:00:00 0.4 mg via SUBLINGUAL
  Filled 2019-03-16: qty 1

## 2019-03-16 NOTE — ED Triage Notes (Signed)
Patient reports centralized chest pain with burning, radiation to back. C/O SOB.Onset 3 hours ago.

## 2019-03-16 NOTE — ED Notes (Signed)
Pt given 20 oz of water

## 2019-03-16 NOTE — ED Provider Notes (Signed)
[facility] EMERGENCY DEPARTMENT Provider Note   CSN: [id number] Arrival date & time: 03/16/19  1308     History Chief Complaint  Patient presents with  . Chest Pain    [name] is a 59 y.o. male with a history including diabetes, GERD, hypertension and chronic back pain presenting with sudden onset of mid sternal chest pain described as burning and pressure with radiation into his back between his shoulder blades as he was walking in his yard.  He denies shortness of breath, nausea, vomiting or diaphoresis, also no palpitations.  He had recently ate lunch then walked to his yard to feed his animals when his symptoms began.  He denies abdominal pain, flank pain, dysuria, fevers or chills.  He has had no medications prior to arrival and has found no alleviators for symptoms.  The history is provided by the patient.    HPI: A 59 year old patient with a history of treated diabetes, hypertension and obesity presents for evaluation of chest pain. Initial onset of pain was approximately 3-6 hours ago. The patient's chest pain is described as heaviness/pressure/tightness and is not worse with exertion. The patient's chest pain is middle- or left-sided, is not well-localized, is not sharp and does not radiate to the arms/jaw/neck. The patient does not complain of nausea and denies diaphoresis. The patient has no history of stroke, has no history of peripheral artery disease, has not smoked in the past 90 days, has no relevant family history of coronary artery disease (first degree relative at less than age 52) and has no history of hypercholesterolemia.   Past Medical History:  Diagnosis Date  . Arthritis   . Diabetes mellitus   . GERD (gastroesophageal reflux disease)   . Headache(784.0)   . Hypertension   . Kidney stones     Patient Active Problem List   Diagnosis Date Noted  . Acute biliary pancreatitis 10/23/2013  . Biliary colic [id number]  . Abdominal pain, right upper  quadrant 10/21/2013  . Esophageal ulcer without bleeding 08/26/2010  . HTN (hypertension) 08/26/2010  . DM type 2 (diabetes mellitus, type 2) ([place]) 08/25/2010  . Chronic pain 08/25/2010    Past Surgical History:  Procedure Laterality Date  . CARDIAC CATHETERIZATION  2009  . CHOLECYSTECTOMY N/A 10/24/2013   Procedure: LAPAROSCOPIC CHOLECYSTECTOMY;  Surgeon: Jamesetta So, MD;  Location: [facility] ORS;  Service: General;  Laterality: N/A;  . COLONOSCOPY N/A 03/31/2013   Procedure: COLONOSCOPY;  Surgeon: Rogene Houston, MD;  Location: [facility] ENDO SUITE;  Service: Endoscopy;  Laterality: N/A;  730       Family History  Problem Relation Age of Onset  . Diabetes Mother   . Heart failure Mother   . Heart disease Mother   . Heart failure Father     Social History   Tobacco Use  . Smoking status: Former Smoker    Types: Cigarettes  . Smokeless tobacco: Never Used  Substance Use Topics  . Alcohol use: No  . Drug use: No    Home Medications Prior to Admission medications   Medication Sig Start Date End Date Taking? Authorizing Provider  ALPRAZolam Duanne Moron) 0.5 MG tablet Take 0.5 mg by mouth 3 (three) times daily as needed for anxiety or sleep.  02/25/15  Yes [provider]  aspirin EC 81 MG tablet Take 81 mg by mouth daily.   Yes [provider]  buPROPion (WELLBUTRIN) 100 MG tablet Take 100 mg by mouth 2 (two) times daily. 02/24/19  Yes  [provider]  diltiazem (TIAZAC) 420 MG 24 hr capsule Take 420 mg by mouth daily. 02/20/19  Yes [provider]  gabapentin (NEURONTIN) 400 MG capsule Take 400 mg by mouth 3 (three) times daily.    Yes [provider]  HYDROcodone-acetaminophen (NORCO) 10-325 MG tablet Take 1 tablet by mouth every 4 (four) hours as needed for moderate pain.  01/25/16  Yes [provider]  JANUVIA 100 MG tablet Take 100 mg by mouth daily. 11/10/18  Yes [provider]  LANTUS 100 UNIT/ML injection Inject 20 Units into  the skin at bedtime.  01/25/19  Yes [provider]  losartan (COZAAR) 100 MG tablet Take 100 mg by mouth daily. 11/16/18  Yes [provider]  metFORMIN (GLUCOPHAGE) 500 MG tablet Take 2,000 mg by mouth every morning.    Yes [provider]  Multiple Vitamin (MULTIVITAMIN WITH MINERALS) TABS tablet Take 1 tablet by mouth daily.   Yes [provider]  omeprazole (PRILOSEC) 20 MG capsule Take 20 mg by mouth daily. 02/21/15  Yes [provider]  tiZANidine (ZANAFLEX) 4 MG tablet Take 4 mg by mouth 4 (four) times daily as needed for muscle spasms.  03/11/19  Yes [provider]    Allergies    Patient has no known allergies.  Review of Systems   Review of Systems  Constitutional: Negative for diaphoresis and fever.  HENT: Negative for congestion and sore throat.   Eyes: Negative.   Respiratory: Positive for chest tightness. Negative for shortness of breath.   Cardiovascular: Positive for chest pain.  Gastrointestinal: Negative for abdominal pain, nausea and vomiting.  Genitourinary: Negative.   Musculoskeletal: Negative for arthralgias, joint swelling and neck pain.  Skin: Negative.  Negative for rash and wound.  Neurological: Negative for dizziness, weakness, light-headedness, numbness and headaches.  Psychiatric/Behavioral: Negative.     Physical Exam Updated Vital Signs BP (!) 145/99   Pulse 93   Temp 98.5 F (36.9 C) (Oral)   Resp 15   Ht 5\' 9"  (1.753 m)   Wt 92.1 kg   SpO2 97%   BMI 29.98 kg/m   Physical Exam Vitals and nursing note reviewed.  Constitutional:      Appearance: He is well-developed.  HENT:     Head: Normocephalic and atraumatic.  Eyes:     Conjunctiva/sclera: Conjunctivae normal.  Cardiovascular:     Rate and Rhythm: Normal rate and regular rhythm.     Heart sounds: Normal heart sounds.  Pulmonary:     Effort: Pulmonary effort is normal.     Breath sounds: Normal breath sounds. No wheezing or  rhonchi.  Abdominal:     General: Bowel sounds are normal.     Palpations: Abdomen is soft.     Tenderness: There is no abdominal tenderness.  Musculoskeletal:        General: Normal range of motion.     Cervical back: Normal range of motion.     Right lower leg: No tenderness. Edema present.     Left lower leg: No tenderness. Edema present.     Comments: Trace bilateral edema  Skin:    General: Skin is warm and dry.  Neurological:     Mental Status: He is alert.     ED Results / Procedures / Treatments   Labs (all labs ordered are listed, but only abnormal results are displayed) Labs Reviewed  BASIC METABOLIC PANEL - Abnormal; Notable for the following components:  Result Value   Glucose, Bld 318 (*)    All other components within normal limits  CBC  LIPASE, BLOOD  TROPONIN I (HIGH SENSITIVITY)  TROPONIN I (HIGH SENSITIVITY)    EKG EKG Interpretation  Date/Time:  Wednesday March 16 2019 13:18:12 EST Ventricular Rate:  108 PR Interval:    QRS Duration: 100 QT Interval:  341 QTC Calculation: 457 R Axis:   112 Text Interpretation: Sinus tachycardia Left posterior fascicular block Confirmed by Lajean Saver [phone]) on 03/16/2019 1:21:45 PM   Radiology DG Chest 2 View  Result Date: 03/16/2019 CLINICAL DATA:  Chest pain EXAM: CHEST - 2 VIEW COMPARISON:  01/30/2016 FINDINGS: The heart size and mediastinal contours are within normal limits. Both lungs are clear. Disc degenerative disease of the thoracic spine. IMPRESSION: No acute abnormality of the lungs. Electronically Signed   By: Eddie Candle M.D.   On: 03/16/2019 14:45    Procedures Procedures (including critical care time)  Medications Ordered in ED Medications  sodium chloride flush (NS) 0.9 % injection 3 mL (3 mLs Intravenous Not Given 03/16/19 1431)  nitroGLYCERIN (NITROSTAT) SL tablet 0.4 mg (0.4 mg Sublingual Given 03/16/19 1431)  insulin aspart (novoLOG) injection 8 Units (has no administration in  time range)  aspirin chewable tablet 324 mg (324 mg Oral Given 03/16/19 1430)  acetaminophen (TYLENOL) tablet 1,000 mg (1,000 mg Oral Given 03/16/19 1523)  famotidine (PEPCID) tablet 20 mg (20 mg Oral Given 03/16/19 1523)  alum & mag hydroxide-simeth (MAALOX/MYLANTA) 200-200-20 MG/5ML suspension 30 mL (30 mLs Oral Given 03/16/19 1523)    ED Course  I have reviewed the triage vital signs and the nursing notes.  Pertinent labs & imaging results that were available during my care of the patient were reviewed by me and considered in my medical decision making (see chart for details).    MDM Rules/Calculators/A&P HEAR Score: 4                    Pt given ASA, ntg x 2, mylanta with improved pain.  Labs reviewed including delta troponin which was negative with out change, additionally had a significantly elevated blood glucose level at 318 without an anion gap.  Other labs unremarkable.  Chest x-ray and EKG also stable with no acute changes.  Patient was given subcutaneous insulin for treatment of his hyperglycemia.  Plan dispo patient home once his blood glucose level is improved.  Patient is currently 9 years out from a completely clean cardiac cath.  We will plan follow-up with cardiology for outpatient evaluation and consideration of further cardiac testing.   Final Clinical Impression(s) / ED Diagnoses Final diagnoses:  Nonspecific chest pain  Hyperglycemia    Rx / DC Orders ED Discharge Orders    None       Landis Martins 03/16/19 1645    Lajean Saver, MD 03/17/19 [phone]

## 2019-03-16 NOTE — Discharge Instructions (Signed)
Your labs, ekg and chest xray are are stable today, except your blood glucose level was quite elevated.  Make sure you are taking your home diabetes medication and watching your diet.  Call the cardiologist listed above for an outpatient visit to discuss further cardiac testing to ensure cardiac health.

## 2019-03-18 ENCOUNTER — Encounter: Payer: Self-pay | Admitting: Cardiology

## 2019-03-18 ENCOUNTER — Other Ambulatory Visit: Payer: Self-pay

## 2019-03-18 ENCOUNTER — Ambulatory Visit (INDEPENDENT_AMBULATORY_CARE_PROVIDER_SITE_OTHER): Payer: Medicaid Other | Admitting: Cardiology

## 2019-03-18 VITALS — BP 178/102 | HR 98 | Temp 97.0°F | Ht 69.0 in | Wt 204.0 lb

## 2019-03-18 DIAGNOSIS — K219 Gastro-esophageal reflux disease without esophagitis: Secondary | ICD-10-CM

## 2019-03-18 DIAGNOSIS — R0789 Other chest pain: Secondary | ICD-10-CM | POA: Diagnosis not present

## 2019-03-18 DIAGNOSIS — I10 Essential (primary) hypertension: Secondary | ICD-10-CM | POA: Diagnosis not present

## 2019-03-18 MED ORDER — CHLORTHALIDONE 25 MG PO TABS: 12.5000 mg | ORAL_TABLET | Freq: Every day | ORAL | 3 refills | Status: DC

## 2019-03-18 MED ORDER — PANTOPRAZOLE SODIUM 40 MG PO TBEC: 40.0000 mg | DELAYED_RELEASE_TABLET | Freq: Every day | ORAL | 11 refills | Status: DC

## 2019-03-18 NOTE — Progress Notes (Signed)
Clinical Summary [name] is a 59 y.o.male seen as new patient for the following medical problems  1. Chest pain - ER visit 02/2019 with chest pain - hstrop neg x 2. CXR no acute process. EKG SR, no acute ischemic changes CAD risk factors: DM2, HTN,    - chest pain started that day. Midchest/epigastric burning, pressure chest and to back in between shoulder blades. Started while walking in yard. Felt hot and sweaty, mild SOB. Not positional. 10/10 in severity Waited 4 hours with constant pain, came ER - in ER reports NG made it better, though also got mylanta - symptoms still ongoing.  - no relation to food.   - remote cath 9 years ago was normal   2. DM2 - followed by pcp - has not been on statin Past Medical History:  Diagnosis Date  . Arthritis   . Diabetes mellitus   . GERD (gastroesophageal reflux disease)   . Headache(784.0)   . Hypertension   . Kidney stones      No Known Allergies   Current Outpatient Medications  Medication Sig Dispense Refill  . ALPRAZolam (XANAX) 0.5 MG tablet Take 0.5 mg by mouth 3 (three) times daily as needed for anxiety or sleep.   2  . aspirin EC 81 MG tablet Take 81 mg by mouth daily.    Marland Kitchen buPROPion (WELLBUTRIN) 100 MG tablet Take 100 mg by mouth 2 (two) times daily.    Marland Kitchen diltiazem (TIAZAC) 420 MG 24 hr capsule Take 420 mg by mouth daily.    Marland Kitchen gabapentin (NEURONTIN) 400 MG capsule Take 400 mg by mouth 3 (three) times daily.     Marland Kitchen HYDROcodone-acetaminophen (NORCO) 10-325 MG tablet Take 1 tablet by mouth every 4 (four) hours as needed for moderate pain.   0  . JANUVIA 100 MG tablet Take 100 mg by mouth daily.    Marland Kitchen LANTUS 100 UNIT/ML injection Inject 20 Units into the skin at bedtime.     Marland Kitchen losartan (COZAAR) 100 MG tablet Take 100 mg by mouth daily.    . metFORMIN (GLUCOPHAGE) 500 MG tablet Take 2,000 mg by mouth every morning.     . Multiple Vitamin (MULTIVITAMIN WITH MINERALS) TABS tablet Take 1 tablet by mouth daily.    Marland Kitchen  omeprazole (PRILOSEC) 20 MG capsule Take 20 mg by mouth daily.  11  . tiZANidine (ZANAFLEX) 4 MG tablet Take 4 mg by mouth 4 (four) times daily as needed for muscle spasms.      No current facility-administered medications for this visit.     Past Surgical History:  Procedure Laterality Date  . CARDIAC CATHETERIZATION  2009  . CHOLECYSTECTOMY N/A 10/24/2013   Procedure: LAPAROSCOPIC CHOLECYSTECTOMY;  Surgeon: Jamesetta So, MD;  Location: [facility];  Service: General;  Laterality: N/A;  . COLONOSCOPY N/A 03/31/2013   Procedure: COLONOSCOPY;  Surgeon: Rogene Houston, MD;  Location: [facility];  Service: Endoscopy;  Laterality: N/A;  730     No Known Allergies    Family History  Problem Relation Age of Onset  . Diabetes Mother   . Heart failure Mother   . Heart disease Mother   . Heart failure Father      Social History [name] reports that he has quit smoking. His smoking use included cigarettes. He has never used smokeless tobacco. [name] reports no history of alcohol use.   Review of Systems CONSTITUTIONAL: No weight loss, fever, chills, weakness or fatigue.  HEENT: Eyes: No visual loss, blurred vision, double vision or yellow sclerae.No hearing loss, sneezing, congestion, runny nose or sore throat.  SKIN: No rash or itching.  CARDIOVASCULAR: per hpi RESPIRATORY: No shortness of breath, cough or sputum.  GASTROINTESTINAL: No anorexia, nausea, vomiting or diarrhea. No abdominal pain or blood.  GENITOURINARY: No burning on urination, no polyuria NEUROLOGICAL: No headache, dizziness, syncope, paralysis, ataxia, numbness or tingling in the extremities. No change in bowel or bladder control.  MUSCULOSKELETAL: No muscle, back pain, joint pain or stiffness.  LYMPHATICS: No enlarged nodes. No history of splenectomy.  PSYCHIATRIC: No history of depression or anxiety.  ENDOCRINOLOGIC: No reports of sweating, cold or heat intolerance. No polyuria or polydipsia.   Marland Kitchen   Physical Examination Today's Vitals   03/18/19 0813  BP: (!) 178/102  Pulse: 98  Temp: (!) 97 F (36.1 C)  SpO2: 99%  Weight: 204 lb (92.5 kg)  Height: 5\' 9"  (1.753 m)   Body mass index is 30.13 kg/m.  Gen: resting comfortably, no acute distress HEENT: no scleral icterus, pupils equal round and reactive, no palptable cervical adenopathy,  CV: RRR no m/r/g, no jvd Resp: Clear to auscultation bilaterally GI: abdomen is soft, non-tender, non-distended, normal bowel sounds, no hepatosplenomegaly MSK: extremities are warm, no edema.  Skin: warm, no rash Neuro:  no focal deficits Psych: appropriate affect   Assessment and Plan  1. Chest pain/epigastric pain - 72 hrs of constant midchest/epigastric pain described as burning/pressure. Symptoms not consistent with cardiac chest pain, ER evaluation was benign for ACS. Tend in epigastric area to palpation, all would suggest GI etiology - d/c prilosec, start protonix 40mg  daily. Refer to GI for evaluation - bp's equal in both arms, symptoms not consiistent with disections.   2. HTN - above goal, start chlorthaldone 12.5mg  daily - bmet/mg in 2 weeks, nursing visit for bp check in 2 weeks  3. DM2 - would consider at least moderate strength statin given his DM2 history    F/u 2 months  Arnoldo Lenis, M.D.

## 2019-03-18 NOTE — Patient Instructions (Signed)
Medication Instructions:  START CHLORTHALIDONE 12.5 MG (1/2 TABLET) daily  START PROTONIX 40 MG DAILY   STOP PRILOSEC   Labwork: 2 WEEKS  BMET MAGNESIUM  Testing/Procedures: NONE  Follow-Up: Your physician recommends that you schedule a follow-up appointment in: [place] VISIT  Your physician recommends that you schedule a follow-up appointment in: 2 MONTHS   You have been referred to DR. [facility] with GI    Any Other Special Instructions Will Be Listed Below (If Applicable).     If you need a refill on your cardiac medications before your next appointment, please call your pharmacy.

## 2019-03-22 ENCOUNTER — Encounter (INDEPENDENT_AMBULATORY_CARE_PROVIDER_SITE_OTHER): Payer: Self-pay | Admitting: Gastroenterology

## 2019-04-01 ENCOUNTER — Ambulatory Visit: Payer: Medicaid Other

## 2019-05-30 ENCOUNTER — Ambulatory Visit: Payer: Medicaid Other | Admitting: Cardiology

## 2019-05-30 NOTE — Progress Notes (Deleted)
Clinical Summary [name] is a 59 y.o.male 1. Chest pain - ER visit 02/2019 with chest pain - hstrop neg x 2. CXR no acute process. EKG SR, no acute ischemic changes CAD risk factors: DM2, HTN,    - chest pain started that day. Midchest/epigastric burning, pressure chest and to back in between shoulder blades. Started while walking in yard. Felt hot and sweaty, mild SOB. Not positional. 10/10 in severity Waited 4 hours with constant pain, came ER - in ER reports NG made it better, though also got mylanta - symptoms still ongoing.  - no relation to food.   - remote cath 9 years ago was normal  - last visit we referred to GI -   2. DM2 - followed by pcp - has not been on statin   Past Medical History:  Diagnosis Date  . Arthritis   . Diabetes mellitus   . GERD (gastroesophageal reflux disease)   . Headache(784.0)   . Hypertension   . Kidney stones      No Known Allergies   Current Outpatient Medications  Medication Sig Dispense Refill  . ALPRAZolam (XANAX) 0.5 MG tablet Take 0.5 mg by mouth 3 (three) times daily as needed for anxiety or sleep.   2  . aspirin EC 81 MG tablet Take 81 mg by mouth daily.    Marland Kitchen buPROPion (WELLBUTRIN) 100 MG tablet Take 100 mg by mouth 2 (two) times daily.    . chlorthalidone (HYGROTON) 25 MG tablet Take 0.5 tablets (12.5 mg total) by mouth daily. 45 tablet 3  . diltiazem (TIAZAC) 420 MG 24 hr capsule Take 420 mg by mouth daily.    Marland Kitchen gabapentin (NEURONTIN) 400 MG capsule Take 400 mg by mouth 3 (three) times daily.     Marland Kitchen HYDROcodone-acetaminophen (NORCO) 10-325 MG tablet Take 1 tablet by mouth every 4 (four) hours as needed for moderate pain.   0  . JANUVIA 100 MG tablet Take 100 mg by mouth daily.    Marland Kitchen LANTUS 100 UNIT/ML injection Inject 20 Units into the skin at bedtime.     Marland Kitchen losartan (COZAAR) 100 MG tablet Take 100 mg by mouth daily.    . metFORMIN (GLUCOPHAGE) 500 MG tablet Take 2,000 mg by mouth every morning.     .  Multiple Vitamin (MULTIVITAMIN WITH MINERALS) TABS tablet Take 1 tablet by mouth daily.    . pantoprazole (PROTONIX) 40 MG tablet Take 1 tablet (40 mg total) by mouth daily. 30 tablet 11  . tiZANidine (ZANAFLEX) 4 MG tablet Take 4 mg by mouth 4 (four) times daily as needed for muscle spasms.      No current facility-administered medications for this visit.     Past Surgical History:  Procedure Laterality Date  . CARDIAC CATHETERIZATION  2009  . CHOLECYSTECTOMY N/A 10/24/2013   Procedure: LAPAROSCOPIC CHOLECYSTECTOMY;  Surgeon: Jamesetta So, MD;  Location: [facility];  Service: General;  Laterality: N/A;  . COLONOSCOPY N/A 03/31/2013   Procedure: COLONOSCOPY;  Surgeon: Rogene Houston, MD;  Location: [facility];  Service: Endoscopy;  Laterality: N/A;  730     No Known Allergies    Family History  Problem Relation Age of Onset  . Diabetes Mother   . Heart failure Mother   . Heart disease Mother   . Heart failure Father      Social History [name] reports that he has quit smoking. His smoking use included cigarettes. He has never used smokeless  tobacco. Mr. Schiefer reports no history of alcohol use.   Review of Systems CONSTITUTIONAL: No weight loss, fever, chills, weakness or fatigue.  HEENT: Eyes: No visual loss, blurred vision, double vision or yellow sclerae.No hearing loss, sneezing, congestion, runny nose or sore throat.  SKIN: No rash or itching.  CARDIOVASCULAR:  RESPIRATORY: No shortness of breath, cough or sputum.  GASTROINTESTINAL: No anorexia, nausea, vomiting or diarrhea. No abdominal pain or blood.  GENITOURINARY: No burning on urination, no polyuria NEUROLOGICAL: No headache, dizziness, syncope, paralysis, ataxia, numbness or tingling in the extremities. No change in bowel or bladder control.  MUSCULOSKELETAL: No muscle, back pain, joint pain or stiffness.  LYMPHATICS: No enlarged nodes. No history of splenectomy.  PSYCHIATRIC: No history of  depression or anxiety.  ENDOCRINOLOGIC: No reports of sweating, cold or heat intolerance. No polyuria or polydipsia.  Marland Kitchen   Physical Examination There were no vitals filed for this visit. There were no vitals filed for this visit.  Gen: resting comfortably, no acute distress HEENT: no scleral icterus, pupils equal round and reactive, no palptable cervical adenopathy,  CV Resp: Clear to auscultation bilaterally GI: abdomen is soft, non-tender, non-distended, normal bowel sounds, no hepatosplenomegaly MSK: extremities are warm, no edema.  Skin: warm, no rash Neuro:  no focal deficits Psych: appropriate affect   Diagnostic Studies     Assessment and Plan   1. Chest pain/epigastric pain - 72 hrs of constant midchest/epigastric pain described as burning/pressure. Symptoms not consistent with cardiac chest pain, ER evaluation was benign for ACS. Tender in epigastric area to palpation, all would suggest GI etiology - d/c prilosec, start protonix 40mg  daily. Refer to GI for evaluation - bp's equal in both arms, symptoms not consiistent with disections.   2. HTN - above goal, start chlorthaldone 12.5mg  daily - bmet/mg in 2 weeks, nursing visit for bp check in 2 weeks  3. DM2 - would consider at least moderate strength statin given his DM2 history     Arnoldo Lenis, M.D., F.A.C.C.

## 2019-06-02 ENCOUNTER — Ambulatory Visit (HOSPITAL_COMMUNITY): Payer: Medicaid Other | Attending: Family Medicine | Admitting: Physical Therapy

## 2019-06-02 ENCOUNTER — Encounter (HOSPITAL_COMMUNITY): Payer: Self-pay

## 2019-06-16 ENCOUNTER — Other Ambulatory Visit: Payer: Self-pay

## 2019-06-16 ENCOUNTER — Encounter (HOSPITAL_COMMUNITY): Payer: Self-pay | Admitting: Internal Medicine

## 2019-06-16 ENCOUNTER — Inpatient Hospital Stay (HOSPITAL_COMMUNITY)
Admission: EM | Admit: 2019-06-16 | Discharge: 2019-06-22 | DRG: 617 | Disposition: A | Discharge status: Home / Self Care | Payer: Medicaid Other | Attending: Family Medicine | Admitting: Family Medicine

## 2019-06-16 ENCOUNTER — Emergency Department (HOSPITAL_COMMUNITY): Payer: Medicaid Other

## 2019-06-16 DIAGNOSIS — Z7982 Long term (current) use of aspirin: Secondary | ICD-10-CM

## 2019-06-16 DIAGNOSIS — Z9049 Acquired absence of other specified parts of digestive tract: Secondary | ICD-10-CM | POA: Diagnosis not present

## 2019-06-16 DIAGNOSIS — Z8249 Family history of ischemic heart disease and other diseases of the circulatory system: Secondary | ICD-10-CM | POA: Diagnosis not present

## 2019-06-16 DIAGNOSIS — G8929 Other chronic pain: Secondary | ICD-10-CM | POA: Diagnosis present

## 2019-06-16 DIAGNOSIS — E11621 Type 2 diabetes mellitus with foot ulcer: Secondary | ICD-10-CM | POA: Diagnosis present

## 2019-06-16 DIAGNOSIS — M869 Osteomyelitis, unspecified: Secondary | ICD-10-CM | POA: Diagnosis present

## 2019-06-16 DIAGNOSIS — Z20822 Contact with and (suspected) exposure to covid-19: Secondary | ICD-10-CM | POA: Diagnosis present

## 2019-06-16 DIAGNOSIS — I10 Essential (primary) hypertension: Secondary | ICD-10-CM | POA: Diagnosis present

## 2019-06-16 DIAGNOSIS — E1165 Type 2 diabetes mellitus with hyperglycemia: Secondary | ICD-10-CM | POA: Diagnosis present

## 2019-06-16 DIAGNOSIS — E1140 Type 2 diabetes mellitus with diabetic neuropathy, unspecified: Secondary | ICD-10-CM | POA: Diagnosis present

## 2019-06-16 DIAGNOSIS — Z833 Family history of diabetes mellitus: Secondary | ICD-10-CM

## 2019-06-16 DIAGNOSIS — R739 Hyperglycemia, unspecified: Secondary | ICD-10-CM

## 2019-06-16 DIAGNOSIS — Z87891 Personal history of nicotine dependence: Secondary | ICD-10-CM

## 2019-06-16 DIAGNOSIS — Z794 Long term (current) use of insulin: Secondary | ICD-10-CM | POA: Diagnosis not present

## 2019-06-16 DIAGNOSIS — K219 Gastro-esophageal reflux disease without esophagitis: Secondary | ICD-10-CM | POA: Diagnosis present

## 2019-06-16 DIAGNOSIS — E1169 Type 2 diabetes mellitus with other specified complication: Secondary | ICD-10-CM

## 2019-06-16 DIAGNOSIS — Z79899 Other long term (current) drug therapy: Secondary | ICD-10-CM

## 2019-06-16 DIAGNOSIS — R52 Pain, unspecified: Secondary | ICD-10-CM

## 2019-06-16 DIAGNOSIS — L97519 Non-pressure chronic ulcer of other part of right foot with unspecified severity: Secondary | ICD-10-CM | POA: Diagnosis present

## 2019-06-16 DIAGNOSIS — E119 Type 2 diabetes mellitus without complications: Secondary | ICD-10-CM

## 2019-06-16 HISTORY — DX: Osteomyelitis, unspecified: M86.9

## 2019-06-16 LAB — COMPREHENSIVE METABOLIC PANEL
ALT: 26 U/L (ref 0–44)
AST: 20 U/L (ref 15–41)
Albumin: 4.1 g/dL (ref 3.5–5.0)
Alkaline Phosphatase: 93 U/L (ref 38–126)
Anion gap: 11 (ref 5–15)
BUN: 20 mg/dL (ref 6–20)
CO2: 22 mmol/L (ref 22–32)
Calcium: 9.3 mg/dL (ref 8.9–10.3)
Chloride: 103 mmol/L (ref 98–111)
Creatinine, Ser: 1.19 mg/dL (ref 0.61–1.24)
GFR calc Af Amer: >60 mL/min (ref >60)
GFR calc non Af Amer: >60 mL/min (ref >60)
Glucose, Bld: 301 mg/dL — ABNORMAL HIGH (ref 70–99)
Potassium: 3.8 mmol/L (ref 3.5–5.1)
Sodium: 136 mmol/L (ref 135–145)
Total Bilirubin: 0.5 mg/dL (ref 0.3–1.2)
Total Protein: 7.5 g/dL (ref 6.5–8.1)

## 2019-06-16 LAB — CBC WITH DIFFERENTIAL/PLATELET
Abs Immature Granulocytes: 0.06 10*3/uL (ref 0.00–0.07)
Basophils Absolute: 0.1 10*3/uL (ref 0.0–0.1)
Basophils Relative: 1 %
Eosinophils Absolute: 0.2 10*3/uL (ref 0.0–0.5)
Eosinophils Relative: 2 %
HCT: 41.6 % (ref 39.0–52.0)
Hemoglobin: 14.4 g/dL (ref 13.0–17.0)
Immature Granulocytes: 1 %
Lymphocytes Relative: 27 %
Lymphs Abs: 2.5 10*3/uL (ref 0.7–4.0)
MCH: 31.2 pg (ref 26.0–34.0)
MCHC: 34.6 g/dL (ref 30.0–36.0)
MCV: 90.2 fL (ref 80.0–100.0)
Monocytes Absolute: 0.6 10*3/uL (ref 0.1–1.0)
Monocytes Relative: 7 %
Neutro Abs: 5.7 10*3/uL (ref 1.7–7.7)
Neutrophils Relative %: 62 %
Platelets: 227 10*3/uL (ref 150–400)
RBC: 4.61 MIL/uL (ref 4.22–5.81)
RDW: 13 % (ref 11.5–15.5)
WBC: 9.2 10*3/uL (ref 4.0–10.5)
nRBC: 0 % (ref 0.0–0.2)

## 2019-06-16 LAB — SEDIMENTATION RATE: Sed Rate: 20 mm/hr — ABNORMAL HIGH (ref 0–16)

## 2019-06-16 LAB — GLUCOSE, CAPILLARY: Glucose-Capillary: 256 mg/dL — ABNORMAL HIGH (ref 70–99)

## 2019-06-16 LAB — LACTIC ACID, PLASMA: Lactic Acid, Venous: 1.4 mmol/L (ref 0.5–1.9)

## 2019-06-16 LAB — SARS CORONAVIRUS 2 BY RT PCR (HOSPITAL ORDER, PERFORMED IN ~~LOC~~ HOSPITAL LAB): SARS Coronavirus 2: NEGATIVE

## 2019-06-16 LAB — C-REACTIVE PROTEIN: CRP: 2.3 mg/dL — ABNORMAL HIGH (ref <1.0)

## 2019-06-16 MED ORDER — HYDROCODONE-ACETAMINOPHEN 10-325 MG PO TABS
1.0000 | ORAL_TABLET | ORAL | Status: DC | PRN
  Administered 2019-06-16 – 2019-06-21 (×14): 1 via ORAL
  Filled 2019-06-16 (×15): qty 1

## 2019-06-16 MED ORDER — DILTIAZEM HCL ER COATED BEADS 300 MG PO CP24
420.0000 mg | ORAL_CAPSULE | Freq: Every day | ORAL | Status: DC
  Administered 2019-06-17 – 2019-06-22 (×6): 420 mg via ORAL
  Filled 2019-06-16 (×9): qty 1

## 2019-06-16 MED ORDER — HYDROMORPHONE HCL 1 MG/ML IJ SOLN
1.0000 mg | Freq: Once | INTRAMUSCULAR | Status: AC
  Administered 2019-06-16: 1 mg via INTRAVENOUS
  Filled 2019-06-16: qty 1

## 2019-06-16 MED ORDER — BUPROPION HCL 100 MG PO TABS
100.0000 mg | ORAL_TABLET | Freq: Two times a day (BID) | ORAL | Status: DC
  Administered 2019-06-16 – 2019-06-22 (×11): 100 mg via ORAL
  Filled 2019-06-16 (×18): qty 1

## 2019-06-16 MED ORDER — OXYCODONE HCL 5 MG PO TABS
5.0000 mg | ORAL_TABLET | Freq: Four times a day (QID) | ORAL | Status: DC | PRN
  Administered 2019-06-17 – 2019-06-22 (×13): 5 mg via ORAL
  Filled 2019-06-16 (×13): qty 1

## 2019-06-16 MED ORDER — LOSARTAN POTASSIUM 50 MG PO TABS
100.0000 mg | ORAL_TABLET | Freq: Every day | ORAL | Status: DC
  Administered 2019-06-17 – 2019-06-22 (×6): 100 mg via ORAL
  Filled 2019-06-16 (×6): qty 2

## 2019-06-16 MED ORDER — INSULIN ASPART 100 UNIT/ML ~~LOC~~ SOLN
0.0000 [IU] | Freq: Every day | SUBCUTANEOUS | Status: DC
  Administered 2019-06-16 – 2019-06-18 (×3): 3 [IU] via SUBCUTANEOUS
  Administered 2019-06-19: 2 [IU] via SUBCUTANEOUS
  Administered 2019-06-21: 5 [IU] via SUBCUTANEOUS

## 2019-06-16 MED ORDER — TIZANIDINE HCL 4 MG PO TABS: 4.0000 mg | ORAL_TABLET | Freq: Four times a day (QID) | ORAL | Status: DC | PRN

## 2019-06-16 MED ORDER — ADULT MULTIVITAMIN W/MINERALS CH
1.0000 | ORAL_TABLET | Freq: Every day | ORAL | Status: DC
  Administered 2019-06-17 – 2019-06-22 (×6): 1 via ORAL
  Filled 2019-06-16 (×6): qty 1

## 2019-06-16 MED ORDER — SODIUM CHLORIDE 0.9 % IV SOLN
2.0000 g | INTRAVENOUS | Status: DC
  Administered 2019-06-16 – 2019-06-21 (×6): 2 g via INTRAVENOUS
  Filled 2019-06-16 (×6): qty 20

## 2019-06-16 MED ORDER — METRONIDAZOLE IN NACL 5-0.79 MG/ML-% IV SOLN
500.0000 mg | Freq: Three times a day (TID) | INTRAVENOUS | Status: DC
  Administered 2019-06-16 – 2019-06-21 (×14): 500 mg via INTRAVENOUS
  Filled 2019-06-16 (×14): qty 100

## 2019-06-16 MED ORDER — ENOXAPARIN SODIUM 40 MG/0.4ML ~~LOC~~ SOLN
40.0000 mg | SUBCUTANEOUS | Status: DC
  Administered 2019-06-16 – 2019-06-19 (×4): 40 mg via SUBCUTANEOUS
  Filled 2019-06-16 (×4): qty 0.4

## 2019-06-16 MED ORDER — INSULIN GLARGINE 100 UNIT/ML ~~LOC~~ SOLN
20.0000 [IU] | Freq: Every day | SUBCUTANEOUS | Status: DC
  Administered 2019-06-17: 20 [IU] via SUBCUTANEOUS
  Filled 2019-06-16 (×4): qty 0.2

## 2019-06-16 MED ORDER — GABAPENTIN 400 MG PO CAPS
400.0000 mg | ORAL_CAPSULE | Freq: Three times a day (TID) | ORAL | Status: DC
  Administered 2019-06-16 – 2019-06-22 (×17): 400 mg via ORAL
  Filled 2019-06-16 (×17): qty 1

## 2019-06-16 MED ORDER — ALPRAZOLAM 0.5 MG PO TABS
0.5000 mg | ORAL_TABLET | Freq: Three times a day (TID) | ORAL | Status: DC | PRN
  Administered 2019-06-17 – 2019-06-21 (×3): 0.5 mg via ORAL
  Filled 2019-06-16 (×3): qty 1

## 2019-06-16 MED ORDER — PANTOPRAZOLE SODIUM 40 MG PO TBEC
40.0000 mg | DELAYED_RELEASE_TABLET | Freq: Every day | ORAL | Status: DC
  Administered 2019-06-17 – 2019-06-22 (×6): 40 mg via ORAL
  Filled 2019-06-16 (×6): qty 1

## 2019-06-16 MED ORDER — CHLORTHALIDONE 25 MG PO TABS
12.5000 mg | ORAL_TABLET | Freq: Every day | ORAL | Status: DC
  Administered 2019-06-17 – 2019-06-22 (×6): 12.5 mg via ORAL
  Filled 2019-06-16 (×6): qty 1

## 2019-06-16 MED ORDER — ASPIRIN EC 81 MG PO TBEC
81.0000 mg | DELAYED_RELEASE_TABLET | Freq: Every day | ORAL | Status: DC
  Administered 2019-06-17 – 2019-06-22 (×6): 81 mg via ORAL
  Filled 2019-06-16 (×6): qty 1

## 2019-06-16 MED ORDER — INSULIN ASPART 100 UNIT/ML ~~LOC~~ SOLN
0.0000 [IU] | Freq: Three times a day (TID) | SUBCUTANEOUS | Status: DC
  Administered 2019-06-17: 5 [IU] via SUBCUTANEOUS
  Administered 2019-06-17: 8 [IU] via SUBCUTANEOUS
  Administered 2019-06-17: 5 [IU] via SUBCUTANEOUS
  Administered 2019-06-18: 15 [IU] via SUBCUTANEOUS
  Administered 2019-06-18 (×2): 5 [IU] via SUBCUTANEOUS
  Administered 2019-06-19: 8 [IU] via SUBCUTANEOUS
  Administered 2019-06-19 (×2): 5 [IU] via SUBCUTANEOUS
  Administered 2019-06-20: 3 [IU] via SUBCUTANEOUS
  Administered 2019-06-20: 5 [IU] via SUBCUTANEOUS
  Administered 2019-06-20: 3 [IU] via SUBCUTANEOUS
  Administered 2019-06-21: 11 [IU] via SUBCUTANEOUS
  Administered 2019-06-21 – 2019-06-22 (×2): 3 [IU] via SUBCUTANEOUS
  Administered 2019-06-22: 8 [IU] via SUBCUTANEOUS

## 2019-06-16 NOTE — ED Triage Notes (Signed)
Right foot pain, states he has an infection in his toe, for 2 weeks

## 2019-06-16 NOTE — ED Provider Notes (Signed)
[facility] EMERGENCY DEPARTMENT Provider Note   CSN: [id number] Arrival date & time: 06/16/19  1709     History Chief Complaint  Patient presents with  . Toe Pain    [name] is a 59 y.o. male with history of hypertension, diabetes mellitus, GERD, arthritis presents for evaluation of acute onset, progressively worsening wound to the right fourth toe for 2 weeks.  He reports that symptoms began after cutting the nail "too close to the skin".  He states that since then he has had progressively worsening pain, swelling, erythema, and abnormal drainage from the toe.  He notes history of diabetic neuropathy and states that he typically has decreased sensation and sometimes burning pain to the feet but he now has pain overlying the toe.  Denies fevers, nausea, vomiting, chest pain, shortness of breath, abdominal pain.  He has been soaking the toe in water and vinegar.  He has also been using alcohol without relief.  The history is provided by the patient.       Past Medical History:  Diagnosis Date  . Arthritis   . Diabetes mellitus   . GERD (gastroesophageal reflux disease)   . Headache(784.0)   . Hypertension   . Kidney stones     Patient Active Problem List   Diagnosis Date Noted  . Osteomyelitis due to type 2 diabetes mellitus ([place]) 06/16/2019  . Osteomyelitis ([place]) 06/16/2019  . Acute biliary pancreatitis 10/23/2013  . Biliary colic [id number]  . Abdominal pain, right upper quadrant 10/21/2013  . Esophageal ulcer without bleeding 08/26/2010  . HTN (hypertension) 08/26/2010  . DM type 2 (diabetes mellitus, type 2) ([place]) 08/25/2010  . Chronic pain 08/25/2010    Past Surgical History:  Procedure Laterality Date  . CARDIAC CATHETERIZATION  2009  . CHOLECYSTECTOMY N/A 10/24/2013   Procedure: LAPAROSCOPIC CHOLECYSTECTOMY;  Surgeon: Jamesetta So, MD;  Location: [facility];  Service: General;  Laterality: N/A;  . COLONOSCOPY N/A 03/31/2013   Procedure: COLONOSCOPY;   Surgeon: Rogene Houston, MD;  Location: [facility];  Service: Endoscopy;  Laterality: N/A;  730       Family History  Problem Relation Age of Onset  . Diabetes Mother   . Heart failure Mother   . Heart disease Mother   . Heart failure Father     Social History   Tobacco Use  . Smoking status: Former Smoker    Types: Cigarettes  . Smokeless tobacco: Never Used  Substance Use Topics  . Alcohol use: No  . Drug use: No    Home Medications Prior to Admission medications   Medication Sig Start Date End Date Taking? Authorizing Provider  ALPRAZolam Duanne Moron) 0.5 MG tablet Take 0.5 mg by mouth 3 (three) times daily as needed for anxiety or sleep.  02/25/15   [provider]  aspirin EC 81 MG tablet Take 81 mg by mouth daily.    [provider]  buPROPion (WELLBUTRIN) 100 MG tablet Take 100 mg by mouth 2 (two) times daily. 02/24/19   [provider]  chlorthalidone (HYGROTON) 25 MG tablet Take 0.5 tablets (12.5 mg total) by mouth daily. 03/18/19 06/16/19  Arnoldo Lenis, MD  diltiazem (TIAZAC) 420 MG 24 hr capsule Take 420 mg by mouth daily. 02/20/19   [provider]  gabapentin (NEURONTIN) 400 MG capsule Take 400 mg by mouth 3 (three) times daily.     [provider]  HYDROcodone-acetaminophen (NORCO) 10-325 MG tablet Take 1 tablet by mouth every  4 (four) hours as needed for moderate pain.  01/25/16   [provider]  JANUVIA 100 MG tablet Take 100 mg by mouth daily. 11/10/18   [provider]  LANTUS 100 UNIT/ML injection Inject 20 Units into the skin at bedtime.  01/25/19   [provider]  losartan (COZAAR) 100 MG tablet Take 100 mg by mouth daily. 11/16/18   [provider]  metFORMIN (GLUCOPHAGE) 500 MG tablet Take 2,000 mg by mouth every morning.     [provider]  Multiple Vitamin (MULTIVITAMIN WITH MINERALS) TABS tablet Take 1 tablet by mouth daily.    [provider]  pantoprazole  (PROTONIX) 40 MG tablet Take 1 tablet (40 mg total) by mouth daily. 03/18/19   Arnoldo Lenis, MD  tiZANidine (ZANAFLEX) 4 MG tablet Take 4 mg by mouth 4 (four) times daily as needed for muscle spasms.  03/11/19   [provider]    Allergies    Patient has no known allergies.  Review of Systems   Review of Systems  Constitutional: Negative for chills and fever.  Respiratory: Negative for shortness of breath.   Cardiovascular: Positive for leg swelling. Negative for chest pain.  Gastrointestinal: Negative for abdominal pain, nausea and vomiting.  Musculoskeletal: Positive for arthralgias.  Skin: Positive for wound.  Neurological: Positive for numbness. Negative for weakness.  All other systems reviewed and are negative.   Physical Exam Updated Vital Signs BP (!) 142/84 (BP Location: Right Arm)   Pulse (!) 102   Temp 98.6 F (37 C) (Oral)   Resp 16   Ht 5\' 9"  (1.753 m)   Wt 90.7 kg   SpO2 97%   BMI 29.53 kg/m   Physical Exam Vitals and nursing note reviewed.  Constitutional:      General: He is not in acute distress.    Appearance: He is well-developed.  HENT:     Head: Normocephalic and atraumatic.  Eyes:     General:        Right eye: No discharge.        Left eye: No discharge.     Conjunctiva/sclera: Conjunctivae normal.  Neck:     Vascular: No JVD.     Trachea: No tracheal deviation.  Cardiovascular:     Rate and Rhythm: Normal rate.     Pulses: Normal pulses.     Comments: 2+ pitting edema involving the bilateral lower extremities.  Bevelyn Buckles' sign absent bilaterally. Pulmonary:     Effort: Pulmonary effort is normal.     Breath sounds: Normal breath sounds.  Abdominal:     General: There is no distension.     Palpations: Abdomen is soft.     Tenderness: There is no abdominal tenderness. There is no guarding or rebound.  Musculoskeletal:     Comments: See below images.  Patient with wound involving the distal tip of the right fourth toe with  circumferential swelling and erythema involving the entire toe.  Tenderness to palpation overlying the length of the digit worse along the tip.  He is able to flex and extend the digit against resistance without difficulty.  Skin:    General: Skin is warm and dry.     Capillary Refill: Capillary refill takes less than 2 seconds.     Findings: No erythema.  Neurological:     Mental Status: He is alert.  Psychiatric:        Behavior: Behavior normal.  ED Results / Procedures / Treatments   Labs (all labs ordered are listed, but only abnormal results are displayed) Labs Reviewed  COMPREHENSIVE METABOLIC PANEL - Abnormal; Notable for the following components:      Result Value   Glucose, Bld 301 (*)    All other components within normal limits  CULTURE, BLOOD (ROUTINE X 2)  CULTURE, BLOOD (ROUTINE X 2)  SARS CORONAVIRUS 2 BY RT PCR (HOSPITAL ORDER, [place] [facility])  CBC WITH DIFFERENTIAL/PLATELET  LACTIC ACID, PLASMA  HEMOGLOBIN A1C  SEDIMENTATION RATE  C-REACTIVE PROTEIN  PREALBUMIN    EKG None  Radiology DG Foot Complete Right  Result Date: 06/16/2019 CLINICAL DATA:  Diabetic ulcer fourth digit EXAM: RIGHT FOOT COMPLETE - 3+ VIEW COMPARISON:  None. FINDINGS: Frontal, oblique, and lateral views of the right foot are obtained. There are destructive changes of the fourth distal phalanx with overlying soft tissue swelling and subcutaneous gas, consistent with osteomyelitis. No other acute or destructive bony abnormalities. Diffuse vascular calcifications are noted. IMPRESSION: 1. Osteomyelitis fourth distal phalanx. Electronically Signed   By: Randa Ngo M.D.   On: 06/16/2019 18:42    Procedures Procedures (including critical care time)  Medications Ordered in ED Medications  cefTRIAXone (ROCEPHIN) 2 g in sodium chloride 0.9 % 100 mL IVPB (2 g Intravenous New Bag/Given 06/16/19 2135)    And  metroNIDAZOLE (FLAGYL) IVPB 500 mg (500 mg  Intravenous New Bag/Given 06/16/19 2135)  insulin aspart (novoLOG) injection 0-15 Units (has no administration in time range)  insulin aspart (novoLOG) injection 0-5 Units (has no administration in time range)  HYDROmorphone (DILAUDID) injection 1 mg (1 mg Intravenous Given 06/16/19 2020)    ED Course  I have reviewed the triage vital signs and the nursing notes.  Pertinent labs & imaging results that were available during my care of the patient were reviewed by me and considered in my medical decision making (see chart for details).    MDM Rules/Calculators/A&P                      Patient presents for evaluation of progressively worsening wound to the left fourth toe with associated pain and swelling.  He is afebrile, mildly tachycardic but vital signs otherwise stable.  Radiographs are concerning for osteomyelitis involving the fourth distal phalanx.  No active drainage on examination but he reports the toe has been draining blood and pus.  Lab work reviewed and interpreted by myself shows hyperglycemia but no evidence of DKA, no leukocytosis or anemia.  Lactate is within normal limits.  He was given IV antibiotics and pain control in the ED.  Spoke with Dr. Waldron Labs with [facility] hospitalist service who agrees to assume care of patient and bring him into the hospital for further evaluation and management.   Final Clinical Impression(s) / ED Diagnoses Final diagnoses:  Pain  Osteomyelitis of fourth toe of right foot ([place])  Hyperglycemia    Rx / DC Orders ED Discharge Orders    None       Debroah Baller 06/16/19 2147    Milton Ferguson, MD 06/16/19 2311

## 2019-06-16 NOTE — H&P (Signed)
TRH H&P   Patient Demographics:    [name], is a 59 y.o. male  MRN: [id number]   DOB - 07-09-1960  Admit Date - 06/16/2019  Outpatient Primary MD for the patient is [name], MD  Referring MD/NP/PA: PA Nils Flack  Patient coming from: Home  Chief Complaint  Patient presents with  . Toe Pain      HPI:    [name]  is a 59 y.o. male, past medical history of hypertension, diabetes mellitus, insulin-dependent, GERD, arthritis, patient presents to ED secondary to fourth digit right lower extremity wound, reports wound has been progressive over last 2 weeks, did start after he was cutting the nail too close to the skin, then he had progressive erythema, swelling, and pain, and with some drainage to the toe and discoloration, he does report he has some diabetic neuropathy as well some decreased sensation, he denies any fever, chills, nausea, vomiting, cough, reports he was soaking his toe daily and Epson salt, with no much help. ED course -She is afebrile, no leukocytosis, no significant labs abnormalities beside his glucose 300, reported usually runs above 200 at home), x-ray was significant for osteomyelitis in the fourth digit, he was started on IV Rocephin and Flagyl, and [facility] hospitalist were consulted to admit    Review of systems:    In addition to the HPI above,  No Fever-chills, reports right foot fourth toe wound No Headache, No changes with Vision or hearing, No problems swallowing food or Liquids, No Chest pain, Cough or Shortness of Breath, No Abdominal pain, No Nausea or Vommitting, Bowel movements are regular, No Blood in stool or Urine, No dysuria, No new skin rashes or bruises, No new joints pains-aches,  No new weakness, tingling, numbness in any extremity, No recent weight gain or loss, No polyuria, polydypsia or polyphagia, No significant  Mental Stressors.  A full 10 point Review of Systems was done, except as stated above, all other Review of Systems were negative.   With Past History of the following :    Past Medical History:  Diagnosis Date  . Arthritis   . Diabetes mellitus   . GERD (gastroesophageal reflux disease)   . Headache(784.0)   . Hypertension   . Kidney stones       Past Surgical History:  Procedure Laterality Date  . CARDIAC CATHETERIZATION  2009  . CHOLECYSTECTOMY N/A 10/24/2013   Procedure: LAPAROSCOPIC CHOLECYSTECTOMY;  Surgeon: Jamesetta So, MD;  Location: [facility];  Service: General;  Laterality: N/A;  . COLONOSCOPY N/A 03/31/2013   Procedure: COLONOSCOPY;  Surgeon: Rogene Houston, MD;  Location: [facility];  Service: Endoscopy;  Laterality: N/A;  730      Social History:     Social History   Tobacco Use  . Smoking status: Former Smoker    Types: Cigarettes  . Smokeless tobacco: Never Used  Substance  Use Topics  . Alcohol use: No     Family History :     Family History  Problem Relation Age of Onset  . Diabetes Mother   . Heart failure Mother   . Heart disease Mother   . Heart failure Father       Home Medications:   Prior to Admission medications   Medication Sig Start Date End Date Taking? Authorizing Provider  ALPRAZolam Duanne Moron) 0.5 MG tablet Take 0.5 mg by mouth 3 (three) times daily as needed for anxiety or sleep.  02/25/15   [provider]  aspirin EC 81 MG tablet Take 81 mg by mouth daily.    [provider]  buPROPion (WELLBUTRIN) 100 MG tablet Take 100 mg by mouth 2 (two) times daily. 02/24/19   [provider]  chlorthalidone (HYGROTON) 25 MG tablet Take 0.5 tablets (12.5 mg total) by mouth daily. 03/18/19 06/16/19  Arnoldo Lenis, MD  diltiazem (TIAZAC) 420 MG 24 hr capsule Take 420 mg by mouth daily. 02/20/19   [provider]  gabapentin (NEURONTIN) 400 MG capsule Take 400 mg by mouth 3 (three) times daily.     [provider]  HYDROcodone-acetaminophen (NORCO) 10-325 MG tablet Take 1 tablet by mouth every 4 (four) hours as needed for moderate pain.  01/25/16   [provider]  JANUVIA 100 MG tablet Take 100 mg by mouth daily. 11/10/18   [provider]  LANTUS 100 UNIT/ML injection Inject 20 Units into the skin at bedtime.  01/25/19   [provider]  losartan (COZAAR) 100 MG tablet Take 100 mg by mouth daily. 11/16/18   [provider]  metFORMIN (GLUCOPHAGE) 500 MG tablet Take 2,000 mg by mouth every morning.     [provider]  Multiple Vitamin (MULTIVITAMIN WITH MINERALS) TABS tablet Take 1 tablet by mouth daily.    [provider]  pantoprazole (PROTONIX) 40 MG tablet Take 1 tablet (40 mg total) by mouth daily. 03/18/19   Arnoldo Lenis, MD  tiZANidine (ZANAFLEX) 4 MG tablet Take 4 mg by mouth 4 (four) times daily as needed for muscle spasms.  03/11/19   [provider]     Allergies:    No Known Allergies   Physical Exam:   Vitals  Blood pressure (!) 142/84, pulse (!) 102, temperature 98.6 F (37 C), temperature source Oral, resp. rate 16, height 5\' 9"  (1.753 m), weight 90.7 kg, SpO2 97 %.   1. General well developed male, laying in bed in no apparent distress  2. Normal affect and insight, Not Suicidal or Homicidal, Awake Alert, Oriented X 3.  3. No F.N deficits, ALL C.Nerves Intact, Strength 5/5 all 4 extremities, Sensation intact all 4 extremities, Plantars down going.  4. Ears and Eyes appear Normal, Conjunctivae clear, PERRLA. Moist Oral Mucosa.  5. Supple Neck, No JVD, No cervical lymphadenopathy appriciated, No Carotid Bruits.  6. Symmetrical Chest wall movement, Good air movement bilaterally, CTAB.  7. RRR, No Gallops, Rubs or Murmurs, No Parasternal Heave.  8. Positive Bowel Sounds, Abdomen Soft, No tenderness, No organomegaly appriciated,No rebound -guarding or rigidity.  9.  No Cyanosis, Normal Skin Turgor,  No Skin Rash or Bruise.  10. Good muscle tone,  joints appear normal , no effusions, Normal ROM.  Right foot fourth toe with erythema, swelling, and open wound and the inferior area, please see picture below.  11. No Palpable Lymph Nodes in Neck or Axillae  Data Review:    CBC Recent Labs  Lab 06/16/19 2030  WBC 9.2  HGB 14.4  HCT 41.6  PLT 227  MCV 90.2  MCH 31.2  MCHC 34.6  RDW 13.0  LYMPHSABS 2.5  MONOABS 0.6  EOSABS 0.2  BASOSABS 0.1   ------------------------------------------------------------------------------------------------------------------  Chemistries  Recent Labs  Lab 06/16/19 2030  NA 136  K 3.8  CL 103  CO2 22  GLUCOSE 301*  BUN 20  CREATININE 1.19  CALCIUM 9.3  AST 20  ALT 26  ALKPHOS 93  BILITOT 0.5   ------------------------------------------------------------------------------------------------------------------ estimated creatinine clearance is 76.2 mL/min (by C-G formula based on SCr of 1.19 mg/dL). ------------------------------------------------------------------------------------------------------------------ No results for input(s): TSH, T4TOTAL, T3FREE, THYROIDAB in the last 72 hours.  Invalid input(s): FREET3  Coagulation profile No results for input(s): INR, PROTIME in the last 168 hours. ------------------------------------------------------------------------------------------------------------------- No results for input(s): DDIMER in the last 72 hours. -------------------------------------------------------------------------------------------------------------------  Cardiac Enzymes No results for input(s): CKMB, TROPONINI, MYOGLOBIN in the last 168 hours.  Invalid input(s): CK ------------------------------------------------------------------------------------------------------------------ No results found for:  BNP   ---------------------------------------------------------------------------------------------------------------  Urinalysis    Component Value Date/Time   COLORURINE YELLOW 02/24/2017 [place] 02/24/2017 1239   LABSPEC 1.024 02/24/2017 1239   PHURINE 5.0 02/24/2017 1239   GLUCOSEU NEGATIVE 02/24/2017 1239   HGBUR NEGATIVE 02/24/2017 1239   BILIRUBINUR NEGATIVE 02/24/2017 1239   KETONESUR 5 (A) 02/24/2017 1239   PROTEINUR 30 (A) 02/24/2017 1239   UROBILINOGEN 0.2 06/12/2014 1255   NITRITE NEGATIVE 02/24/2017 1239   LEUKOCYTESUR NEGATIVE 02/24/2017 1239    ----------------------------------------------------------------------------------------------------------------   Imaging Results:    DG Foot Complete Right  Result Date: 06/16/2019 CLINICAL DATA:  Diabetic ulcer fourth digit EXAM: RIGHT FOOT COMPLETE - 3+ VIEW COMPARISON:  None. FINDINGS: Frontal, oblique, and lateral views of the right foot are obtained. There are destructive changes of the fourth distal phalanx with overlying soft tissue swelling and subcutaneous gas, consistent with osteomyelitis. No other acute or destructive bony abnormalities. Diffuse vascular calcifications are noted. IMPRESSION: 1. Osteomyelitis fourth distal phalanx. Electronically Signed   By: Randa Ngo M.D.   On: 06/16/2019 18:42      Assessment & Plan:    Active Problems:   DM type 2 (diabetes mellitus, type 2) (HCC)   HTN (hypertension)   Osteomyelitis due to type 2 diabetes mellitus ([place])   Osteomyelitis (HCC)   Osteomyelitis of fourth toe of right foot -Patient reports progressive wound over last 2 weeks, he is afebrile, with no leukocytosis, x-ray significant for osteomyelitis, blood cultures were sent, he will be started on IV Rocephin and IV Flagyl, will check ultrasound ABI of right lower extremity to monitor circulation, will talk to ID about antibiotic recommendation, likely will need 4 to 6 weeks of IV  antibiotics.  Diabetes mellitus, insulin-dependent, poorly controlled with hyperglycemia -Check A1c, will resume on Lantus (will decrease from 25 to 20 units), will add insulin sliding scale, will hold oral agents including Januvia and Metformin.  Hypertension -Continue with home medications  Diabetic neuropathy -Continue with gabapentin  GERD -Continue with PPI   DVT ProphylaxisLovenox   AM Labs Ordered, also please review Full Orders  Family Communication: Admission, patients condition and plan of care including tests being ordered have been discussed with the patient  who indicate understanding and agree with the plan and Code Status.  Code Status Full  Likely DC to  Home  Condition GUARDED    Consults called: None  Admission status: Inpatient  Time spent in minutes : 50 minutes   Phillips Climes M.D on 06/16/2019 at 9:35 PM   [facility] - Office  [phone]

## 2019-06-17 ENCOUNTER — Inpatient Hospital Stay (HOSPITAL_COMMUNITY): Payer: Medicaid Other

## 2019-06-17 LAB — BASIC METABOLIC PANEL
Anion gap: 10 (ref 5–15)
BUN: 20 mg/dL (ref 6–20)
CO2: 23 mmol/L (ref 22–32)
Calcium: 8.7 mg/dL — ABNORMAL LOW (ref 8.9–10.3)
Chloride: 103 mmol/L (ref 98–111)
Creatinine, Ser: 0.88 mg/dL (ref 0.61–1.24)
GFR calc Af Amer: >60 mL/min (ref >60)
GFR calc non Af Amer: >60 mL/min (ref >60)
Glucose, Bld: 274 mg/dL — ABNORMAL HIGH (ref 70–99)
Potassium: 3.4 mmol/L — ABNORMAL LOW (ref 3.5–5.1)
Sodium: 136 mmol/L (ref 135–145)

## 2019-06-17 LAB — GLUCOSE, CAPILLARY
Glucose-Capillary: 230 mg/dL — ABNORMAL HIGH (ref 70–99)
Glucose-Capillary: 256 mg/dL — ABNORMAL HIGH (ref 70–99)
Glucose-Capillary: 249 mg/dL — ABNORMAL HIGH (ref 70–99)
Glucose-Capillary: 257 mg/dL — ABNORMAL HIGH (ref 70–99)

## 2019-06-17 LAB — CBC
HCT: 38.1 % — ABNORMAL LOW (ref 39.0–52.0)
Hemoglobin: 13.3 g/dL (ref 13.0–17.0)
MCH: 31.6 pg (ref 26.0–34.0)
MCHC: 34.9 g/dL (ref 30.0–36.0)
MCV: 90.5 fL (ref 80.0–100.0)
Platelets: 214 10*3/uL (ref 150–400)
RBC: 4.21 MIL/uL — ABNORMAL LOW (ref 4.22–5.81)
RDW: 13 % (ref 11.5–15.5)
WBC: 7.1 10*3/uL (ref 4.0–10.5)
nRBC: 0 % (ref 0.0–0.2)

## 2019-06-17 LAB — HIV ANTIBODY (ROUTINE TESTING W REFLEX): HIV Screen 4th Generation wRfx: NONREACTIVE

## 2019-06-17 LAB — HEMOGLOBIN A1C
Hgb A1c MFr Bld: 9.2 % — ABNORMAL HIGH (ref 4.8–5.6)
Mean Plasma Glucose: 217.34 mg/dL

## 2019-06-17 LAB — PREALBUMIN: Prealbumin: 26.8 mg/dL (ref 18–38)

## 2019-06-17 MED ORDER — JUVEN PO PACK
1.0000 | PACK | Freq: Two times a day (BID) | ORAL | Status: DC
  Administered 2019-06-17 – 2019-06-22 (×8): 1 via ORAL
  Filled 2019-06-17 (×9): qty 1

## 2019-06-17 MED ORDER — ENSURE MAX PROTEIN PO LIQD
11.0000 [oz_av] | Freq: Two times a day (BID) | ORAL | Status: DC
  Administered 2019-06-17: 11 [oz_av] via ORAL

## 2019-06-17 MED ORDER — INSULIN GLARGINE 100 UNIT/ML ~~LOC~~ SOLN
25.0000 [IU] | Freq: Every day | SUBCUTANEOUS | Status: DC
  Administered 2019-06-17: 25 [IU] via SUBCUTANEOUS
  Filled 2019-06-17 (×3): qty 0.25

## 2019-06-17 NOTE — Progress Notes (Signed)
Initial Nutrition Assessment  DOCUMENTATION CODES:   Not applicable  INTERVENTION:  Ensure Max po BID, each supplement provides 150 kcal and 30 grams of protein  -1 packet Juven BID, each packet provides 95 calories, 2.5 grams of protein (collagen), and 9.8 grams of carbohydrate (3 grams sugar); also contains 7 grams of L-arginine and L-glutamine, 300 mg vitamin C, 15 mg vitamin E, 1.2 mcg vitamin B-12, 9.5 mg zinc, 200 mg calcium, and 1.5 g  Calcium Beta-hydroxy-Beta-methylbutyrate to support wound healing   NUTRITION DIAGNOSIS:   Increased nutrient needs related to wound healing(osteomyelitis to right fourth toe) as evidenced by estimated needs.  GOAL:   Patient will meet greater than or equal to 90% of their needs    MONITOR:   PO intake, Labs, I & O's, Supplement acceptance, Weight trends, Skin  REASON FOR ASSESSMENT:   Consult Wound healing  ASSESSMENT:  RD working remotely.  59 year old male admitted for osteomyelitis in fourth left digit of RLE after presenting with progressive erythema, swelling, pain, drainage, and discoloration over the past 2 weeks after cutting the nail too close to the skin. Past medical history significant for HTN, IDDM, diabetic neuropathy, GERD, and arthritis.  Patient evaluated by surgery, plans to continue IV antibiotics for the next few days. Possible right fourth toe amputation pending progress.  Patient is on a CM diet, no documented meals at this time for review. Will continue to monitor for po intakes and provide Ensure Max to aid with meeting needs as well as Juven to support wound healing.  Non pitting BLE edema noted per RN assessment. Current weight 199.54 lb and stable over the past 3 years per history.  Medications reviewed and include: Lovenox, Gabapentin, SSI, Lantus 20 units at bedtime, MVI, Protonix IVPB: Rocephin, Flagyl Labs: CBGs [id number] since admit, K 3.4 (L) Lab Results  Component Value Date   HGBA1C 9.2 (H)  06/16/2019     NUTRITION - FOCUSED PHYSICAL EXAM: Unable to complete at this time, RD working remotely.  Diet Order:   Diet Order            Diet heart healthy/carb modified Room service appropriate? Yes; Fluid consistency: Thin  Diet effective now              EDUCATION NEEDS:   No education needs have been identified at this time  Skin:  Skin Assessment: Skin Integrity Issues: Skin Integrity Issues:: Diabetic Ulcer Diabetic Ulcer: right fourth toe  Last BM:  5/27  Height:   Ht Readings from Last 1 Encounters:  06/16/19 5\' 9"  (1.753 m)    Weight:   Wt Readings from Last 1 Encounters:  06/16/19 90.7 kg    BMI:  Body mass index is 29.53 kg/m.  Estimated Nutritional Needs:   Kcal:  [id number]  Protein:  118-136  Fluid:  >/= 2.2 L/day   Lajuan Lines, RD, LDN Clinical Nutrition After Hours/Weekend Pager # in [place]

## 2019-06-17 NOTE — Progress Notes (Addendum)
Inpatient Diabetes Program Recommendations  AACE/ADA: New Consensus Statement on Inpatient Glycemic Control (2015)  Target Ranges:  Prepandial:   less than 140 mg/dL      Peak postprandial:   less than 180 mg/dL (1-2 hours)      Critically ill patients:  140 - 180 mg/dL   Lab Results  Component Value Date   GLUCAP 230 (H) 06/17/2019   HGBA1C 9.2 (H) 06/16/2019    Review of Glycemic Control Results for [name], [name] (MRN [id number]) as of 06/17/2019 11:50  Ref. Range 06/16/2019 23:15 06/17/2019 07:33 06/17/2019 11:46  Glucose-Capillary Latest Ref Range: 70 - 99 mg/dL 256 (H) 230 (H) 256 (H)   Diabetes history: DM 2 Outpatient Diabetes medications: Lantus 20 units qhs, Januvia 100 mg Daily, Metformin 2000 mg Daily Current orders for Inpatient glycemic control:  Lantus 20 units Novolog 0-15 units tid + hs   Increase Lantus to 26 units   Spoke with pt over the phone regarding A1c and glucose control at home. Discussed current A1c of 9.2% this admission. Discussed glucose and A1c goals. Pt reports checking glucose daily in the morning and sees trends in the 200-300 range. Pt reports loosing 100 lbs since his DM diagnosis to help with glucose control. Pt reports following a DM diet.  Discussed when to call his PCP for insulin adjustments. Discussed importance of glucose control on wound healing. Pt has all DM supplies and insulin at home. Pt sees his PCP (Dr. Gerarda Fraction and/or Delman Cheadle, PA) every month for pain medications and gets his A1c checked once every 3 months.  Thanks,  Tama Headings RN, MSN, BC-ADM Inpatient Diabetes Coordinator Team Pager [phone] (8a-5p)

## 2019-06-17 NOTE — Progress Notes (Signed)
PROGRESS NOTE    [name]  [id number] DOB: 01-Feb-1960 DOA: 06/16/2019 PCP: [name], MD    Brief Narrative:  HPI:  [name]  is a 59 y.o. male, past medical history of hypertension, diabetes mellitus, insulin-dependent, GERD, arthritis, patient presents to ED secondary to fourth digit right lower extremity wound, reports wound has been progressive over last 2 weeks, did start after he was cutting the nail too close to the skin, then he had progressive erythema, swelling, and pain, and with some drainage to the toe and discoloration, he does report he has some diabetic neuropathy as well some decreased sensation, he denies any fever, chills, nausea, vomiting, cough, reports he was soaking his toe daily and Epson salt, with no much help.   Assessment & Plan:   Active Problems:   DM type 2 (diabetes mellitus, type 2) (HCC)   HTN (hypertension)   Osteomyelitis due to type 2 diabetes mellitus ([place])   Osteomyelitis of fourth toe of right foot ([place])   1. Osteomyelitis of fourth toe of right foot.  Currently on IV antibiotics.  Seen by general surgery.  Will likely need amputation of fourth toe.  ABIs have been ordered.  2. DM 2, insulin dependent with hyperglycemia.  Increase Lantus to 25.  Continue on sliding scale insulin. 3. Hypertension.  On diltiazem and losartan.  Blood pressure currently stable. 4. Diabetic neuropathy.  Continue gabapentin 5. GERD.  Continue PPI.   DVT prophylaxis: Lovenox Code Status: Full code Family Communication: Discussed with patient Disposition Plan: Status is: Inpatient  Remains inpatient appropriate because:Ongoing diagnostic testing needed not appropriate for outpatient work up patient will likely need amputation of fourth digit.  General surgery.   Dispo: The patient is from: Home              Anticipated d/c is to: Home              Anticipated d/c date is: > 3 days              Patient currently is not medically stable  to d/c.          Consultants:   General surgery  Procedures:     Antimicrobials:   Ceftriaxone 5/27>  Flagyl 5/27>   Subjective: Continues to have some pain in his right foot  Objective: Vitals:   06/16/19 2303 06/17/19 0429 06/17/19 0857 06/17/19 1400  BP: (!) 165/96 (!) 149/88  129/81  Pulse: 92 92  89  Resp: 16 16  16   Temp: 98.1 F (36.7 C) 97.9 F (36.6 C)  98.2 F (36.8 C)  TempSrc: Oral Oral  Oral  SpO2: 98% 98% 98% 98%  Weight:      Height:        Intake/Output Summary (Last 24 hours) at 06/17/2019 2015 Last data filed at 06/17/2019 1835 Gross per 24 hour  Intake 548.15 ml  Output --  Net 548.15 ml   Filed Weights   06/16/19 1739  Weight: 90.7 kg    Examination:  General exam: Appears calm and comfortable  Respiratory system: Clear to auscultation. Respiratory effort normal. Cardiovascular system: S1 & S2 heard, RRR. No JVD, murmurs, rubs, gallops or currently on antibiotics.  Seen by general surgery acute no pedal edema. Gastrointestinal system: Abdomen is nondistended, soft and nontender. No organomegaly or masses felt. Normal bowel sounds heard. Central nervous system: Alert and oriented. No focal neurological deficits. Extremities: Symmetric 5 x 5 power. Skin: Deformity of right fourth toe  distal tuft with soft eschar Psychiatry: Judgement and insight appear normal. Mood & affect appropriate.     Data Reviewed: I have personally reviewed following labs and imaging studies  CBC: Recent Labs  Lab 06/16/19 2030 06/17/19 0513  WBC 9.2 7.1  NEUTROABS 5.7  --   HGB 14.4 13.3  HCT 41.6 38.1*  MCV 90.2 90.5  PLT 227 [id number]   Basic Metabolic Panel: Recent Labs  Lab 06/16/19 2030 06/17/19 0513  NA 136 136  K 3.8 3.4*  CL 103 103  CO2 22 23  GLUCOSE 301* 274*  BUN 20 20  CREATININE 1.19 0.88  CALCIUM 9.3 8.7*   GFR: Estimated Creatinine Clearance: 103.1 mL/min (by C-G formula based on SCr of 0.88 mg/dL). Liver Function  Tests: Recent Labs  Lab 06/16/19 2030  AST 20  ALT 26  ALKPHOS 93  BILITOT 0.5  PROT 7.5  ALBUMIN 4.1   No results for input(s): LIPASE, AMYLASE in the last 168 hours. No results for input(s): AMMONIA in the last 168 hours. Coagulation Profile: No results for input(s): INR, PROTIME in the last 168 hours. Cardiac Enzymes: No results for input(s): CKTOTAL, CKMB, CKMBINDEX, TROPONINI in the last 168 hours. BNP (last 3 results) No results for input(s): PROBNP in the last 8760 hours. HbA1C: Recent Labs    06/16/19 2030  HGBA1C 9.2*   CBG: Recent Labs  Lab 06/16/19 2315 06/17/19 0733 06/17/19 1146 06/17/19 1636  GLUCAP 256* 230* 256* 249*   Lipid Profile: No results for input(s): CHOL, HDL, LDLCALC, TRIG, CHOLHDL, LDLDIRECT in the last 72 hours. Thyroid Function Tests: No results for input(s): TSH, T4TOTAL, FREET4, T3FREE, THYROIDAB in the last 72 hours. Anemia Panel: No results for input(s): VITAMINB12, FOLATE, FERRITIN, TIBC, IRON, RETICCTPCT in the last 72 hours. Sepsis Labs: Recent Labs  Lab 06/16/19 2030  LATICACIDVEN 1.4    Recent Results (from the past 240 hour(s))  Blood Cultures x 2 sites     Status: None (Preliminary result)   Collection Time: 06/16/19  8:24 PM   Specimen: BLOOD LEFT HAND  Result Value Ref Range Status   Specimen Description BLOOD LEFT HAND  Final   Special Requests   Final    BOTTLES DRAWN AEROBIC AND ANAEROBIC Blood Culture results may not be optimal due to an inadequate volume of blood received in culture bottles   Culture   Final    NO GROWTH < 12 HOURS Performed at [facility], [address], [place], [place] [address]    Report Status PENDING  Incomplete  Blood Cultures x 2 sites     Status: None (Preliminary result)   Collection Time: 06/16/19  8:30 PM   Specimen: BLOOD LEFT ARM  Result Value Ref Range Status   Specimen Description BLOOD LEFT ARM  Final   Special Requests   Final    BOTTLES DRAWN AEROBIC AND ANAEROBIC  Blood Culture results may not be optimal due to an inadequate volume of blood received in culture bottles   Culture   Final    NO GROWTH < 12 HOURS Performed at [facility], [address], [place], [place] [address]    Report Status PENDING  Incomplete  SARS Coronavirus 2 by RT PCR (hospital order, performed in [place] hospital lab) Nasopharyngeal Nasopharyngeal Swab     Status: None   Collection Time: 06/16/19  9:36 PM   Specimen: Nasopharyngeal Swab  Result Value Ref Range Status   SARS Coronavirus 2 NEGATIVE NEGATIVE Final  Comment: (NOTE) SARS-CoV-2 target nucleic acids are NOT DETECTED. The SARS-CoV-2 RNA is generally detectable in upper and lower respiratory specimens during the acute phase of infection. The lowest concentration of SARS-CoV-2 viral copies this assay can detect is 250 copies / mL. A negative result does not preclude SARS-CoV-2 infection and should not be used as the sole basis for treatment or other patient management decisions.  A negative result may occur with improper specimen collection / handling, submission of specimen other than nasopharyngeal swab, presence of viral mutation(s) within the areas targeted by this assay, and inadequate number of viral copies (<250 copies / mL). A negative result must be combined with clinical observations, patient history, and epidemiological information. Fact Sheet for Patients:   StrictlyIdeas.no Fact Sheet for Healthcare Providers: BankingDealers.co.za This test is not yet approved or cleared  by the [place] FDA and has been authorized for detection and/or diagnosis of SARS-CoV-2 by FDA under an Emergency Use Authorization (EUA).  This EUA will remain in effect (meaning this test can be used) for the duration of the COVID-19 declaration under Section 564(b)(1) of the Act, 21 U.S.C. section 360bbb-3(b)(1), unless the authorization is terminated or revoked  sooner. Performed at [facility], [address] [place], [place] [address]          Radiology Studies: US ARTERIAL ABI (SCREENING LOWER EXTREMITY)  Result Date: 06/17/2019 CLINICAL DATA:  Right fourth toe ulcer. History of hypertension and diabetes. EXAM: NONINVASIVE PHYSIOLOGIC VASCULAR STUDY OF BILATERAL LOWER EXTREMITIES TECHNIQUE: Evaluation of both lower extremities were performed at rest, including calculation of ankle-brachial indices with single level Doppler, pressure and pulse volume recording. COMPARISON:  None. FINDINGS: Right ABI: 1.36 at the level of the dorsalis pedis artery. The posterior tibial artery is noncompressible. Left ABI: Both posterior tibial and dorsalis pedis arteries are noncompressible. Right Lower Extremity: Monophasic posterior tibial and biphasic dorsalis pedis waveforms Left Lower Extremity: Biphasic posterior tibial and dorsalis pedis waveforms 1.0-1.4 Normal IMPRESSION: The resting right ankle-brachial index is within normal limits at the level of the dorsalis pedis artery. Noninvasive evaluation is very limited bilaterally due to noncompressibility of the right posterior tibial artery and left posterior tibial and dorsalis pedis arteries. Electronically Signed   By: Aletta Edouard M.D.   On: 06/17/2019 11:18   DG Foot Complete Right  Result Date: 06/16/2019 CLINICAL DATA:  Diabetic ulcer fourth digit EXAM: RIGHT FOOT COMPLETE - 3+ VIEW COMPARISON:  None. FINDINGS: Frontal, oblique, and lateral views of the right foot are obtained. There are destructive changes of the fourth distal phalanx with overlying soft tissue swelling and subcutaneous gas, consistent with osteomyelitis. No other acute or destructive bony abnormalities. Diffuse vascular calcifications are noted. IMPRESSION: 1. Osteomyelitis fourth distal phalanx. Electronically Signed   By: Randa Ngo M.D.   On: 06/16/2019 18:42        Scheduled Meds: . aspirin EC  81 mg Oral Daily  .  buPROPion  100 mg Oral BID  . chlorthalidone  12.5 mg Oral Daily  . diltiazem (CARDIZEM CD) 24 hr capsule 420 mg  420 mg Oral Daily  . enoxaparin (LOVENOX) injection  40 mg Subcutaneous Q24H  . gabapentin  400 mg Oral TID  . insulin aspart  0-15 Units Subcutaneous TID WC  . insulin aspart  0-5 Units Subcutaneous QHS  . insulin glargine  20 Units Subcutaneous QHS  . losartan  100 mg Oral Daily  . multivitamin with minerals  1 tablet Oral Daily  . nutrition supplement (  JUVEN)  1 packet Oral BID BM  . pantoprazole  40 mg Oral Daily  . Ensure Max Protein  11 oz Oral BID   Continuous Infusions: . cefTRIAXone (ROCEPHIN)  IV 2 g (06/16/19 2135)   And  . metronidazole 500 mg (06/17/19 1441)     LOS: 1 day    Time spent: 30 mins    Kathie Dike, MD [facility]   If 7PM-7AM, please contact night-coverage [email or web address]  06/17/2019, 8:15 PM

## 2019-06-17 NOTE — Consult Note (Signed)
WOC Nurse Consult Note: Patient receiving care in [facility].  Consult completed remotely after review of record and photos. Reason for Consult: LE wound Wound type: necrotic right 4th toe with radiographic evidence of osteomyelitis Pressure Injury POA: Yes/No/NA Measurement: Entire 4th toe is discolored, edematous Wound bed: toe tip is necrotic and black Drainage (amount, consistency, odor)  Periwound: intact Dressing procedure/placement/frequency:  Wash right 4th toe wound with soap and water.  Place a xeroform gauze Kellie Simmering [phone]) around the toe, secure with gauze, change daily. Thank you for the consult.  [place] nurse will not follow at this time.  Please re-consult the [place] team if needed.  Val Riles, RN, MSN, CWOCN, CNS-BC, pager [phone]

## 2019-06-17 NOTE — Consult Note (Signed)
Reason for Consult: Right fourth toe cellulitis Referring Physician: Dr. Rosey Bath MURRAY [name] is an 59 y.o. male.  HPI: Patient is a 59 year old male who was referred to my care for evaluation and treatment of a right fourth toe cellulitis. 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 He recently trimmed the nail on it 2 weeks ago and is since developed redness and swelling of the right fourth toe. He is not under the care of a podiatrist. He has had diabetes mellitus for many years. He denies any fevers. He currently has minimal pain in the right fourth toe.  Past Medical History:  Diagnosis Date  . Arthritis   . Diabetes mellitus   . GERD (gastroesophageal reflux disease)   . Headache(784.0)   . Hypertension   . Kidney stones     Past Surgical History:  Procedure Laterality Date  . CARDIAC CATHETERIZATION  2009  . CHOLECYSTECTOMY N/A 10/24/2013   Procedure: LAPAROSCOPIC CHOLECYSTECTOMY;  Surgeon: Jamesetta So, MD;  Location: [facility];  Service: General;  Laterality: N/A;  . COLONOSCOPY N/A 03/31/2013   Procedure: COLONOSCOPY;  Surgeon: Rogene Houston, MD;  Location: [facility];  Service: Endoscopy;  Laterality: N/A;  730    Family History  Problem Relation Age of Onset  . Diabetes Mother   . Heart failure Mother   . Heart disease Mother   . Heart failure Father     Social History:  reports that he has quit smoking. His smoking use included cigarettes. He has never used smokeless tobacco. He reports that he does not drink alcohol or use drugs.  Allergies: No Known Allergies  Medications:  Scheduled: . aspirin EC  81 mg Oral Daily  . buPROPion  100 mg Oral BID  . chlorthalidone  12.5 mg Oral Daily  . diltiazem (CARDIZEM CD) 24 hr capsule 420 mg  420 mg Oral Daily  . enoxaparin (LOVENOX) injection  40 mg Subcutaneous Q24H  . gabapentin  400 mg Oral TID  . insulin aspart  0-15 Units Subcutaneous TID WC  . insulin aspart  0-5  Units Subcutaneous QHS  . insulin glargine  20 Units Subcutaneous QHS  . losartan  100 mg Oral Daily  . multivitamin with minerals  1 tablet Oral Daily  . pantoprazole  40 mg Oral Daily    Results for orders placed or performed during the hospital encounter of 06/16/19 (from the past 48 hour(s))  Blood Cultures x 2 sites     Status: None (Preliminary result)   Collection Time: 06/16/19  8:24 PM   Specimen: BLOOD LEFT HAND  Result Value Ref Range   Specimen Description BLOOD LEFT HAND    Special Requests      BOTTLES DRAWN AEROBIC AND ANAEROBIC Blood Culture results may not be optimal due to an inadequate volume of blood received in culture bottles   Culture      NO GROWTH < 12 HOURS Performed at [facility], [address], [place], [place] [address]    Report Status PENDING   Comprehensive metabolic panel     Status: Abnormal   Collection Time: 06/16/19  8:30 PM  Result Value Ref Range   Sodium 136 135 - 145 mmol/L   Potassium 3.8 3.5 - 5.1 mmol/L   Chloride 103 98 - 111 mmol/L   CO2 22 22 - 32 mmol/L   Glucose, Bld 301 (H) 70 - 99 mg/dL    Comment: Glucose reference  range applies only to samples taken after fasting for at least 8 hours.   BUN 20 6 - 20 mg/dL   Creatinine, Ser 1.19 0.61 - 1.24 mg/dL   Calcium 9.3 8.9 - 10.3 mg/dL   Total Protein 7.5 6.5 - 8.1 g/dL   Albumin 4.1 3.5 - 5.0 g/dL   AST 20 15 - 41 U/L   ALT 26 0 - 44 U/L   Alkaline Phosphatase 93 38 - 126 U/L   Total Bilirubin 0.5 0.3 - 1.2 mg/dL   GFR calc non Af Amer >60 >60 mL/min   GFR calc Af Amer >60 >60 mL/min   Anion gap 11 5 - 15    Comment: Performed at [facility], [address] [place], [place] [address]  CBC with Differential     Status: None   Collection Time: 06/16/19  8:30 PM  Result Value Ref Range   WBC 9.2 4.0 - 10.5 K/uL   RBC 4.61 4.22 - 5.81 MIL/uL   Hemoglobin 14.4 13.0 - 17.0 g/dL   HCT 41.6 39.0 - 52.0 %   MCV 90.2 80.0 - 100.0 fL   MCH 31.2 26.0 - 34.0 pg   MCHC 34.6 30.0 -  36.0 g/dL   RDW 13.0 11.5 - 15.5 %   Platelets 227 150 - 400 K/uL   nRBC 0.0 0.0 - 0.2 %   Neutrophils Relative % 62 %   Neutro Abs 5.7 1.7 - 7.7 K/uL   Lymphocytes Relative 27 %   Lymphs Abs 2.5 0.7 - 4.0 K/uL   Monocytes Relative 7 %   Monocytes Absolute 0.6 0.1 - 1.0 K/uL   Eosinophils Relative 2 %   Eosinophils Absolute 0.2 0.0 - 0.5 K/uL   Basophils Relative 1 %   Basophils Absolute 0.1 0.0 - 0.1 K/uL   Immature Granulocytes 1 %   Abs Immature Granulocytes 0.06 0.00 - 0.07 K/uL    Comment: Performed at [facility], [address] [place], [place] [address]  Lactic acid     Status: None   Collection Time: 06/16/19  8:30 PM  Result Value Ref Range   Lactic Acid, Venous 1.4 0.5 - 1.9 mmol/L    Comment: Performed at [facility], [address] [place], [place] [address]  Blood Cultures x 2 sites     Status: None (Preliminary result)   Collection Time: 06/16/19  8:30 PM   Specimen: BLOOD LEFT ARM  Result Value Ref Range   Specimen Description BLOOD LEFT ARM    Special Requests      BOTTLES DRAWN AEROBIC AND ANAEROBIC Blood Culture results may not be optimal due to an inadequate volume of blood received in culture bottles   Culture      NO GROWTH < 12 HOURS Performed at [facility], [address] [place], [place] [address]    Report Status PENDING   Hemoglobin A1c     Status: Abnormal   Collection Time: 06/16/19  8:30 PM  Result Value Ref Range   Hgb A1c MFr Bld 9.2 (H) 4.8 - 5.6 %    Comment: (NOTE) Pre diabetes:          5.7%-6.4% Diabetes:              >6.4% Glycemic control for   <7.0% adults with diabetes    Mean Plasma Glucose 217.34 mg/dL    Comment: Performed at [facility], [address] [place],  [address]  Sedimentation rate  Status: Abnormal   Collection Time: 06/16/19  8:30 PM  Result Value Ref Range   Sed Rate 20 (H) 0 - 16 mm/hr    Comment: Performed at [facility], [address] [place], [place] [address]  C-reactive protein      Status: Abnormal   Collection Time: 06/16/19  8:30 PM  Result Value Ref Range   CRP 2.3 (H) <1.0 mg/dL    Comment: Performed at [facility], [address] [place], [place] [address]  Prealbumin     Status: None   Collection Time: 06/16/19  8:30 PM  Result Value Ref Range   Prealbumin 26.8 18 - 38 mg/dL    Comment: Performed at [facility], [place] [address] [place], [place] [address]  SARS Coronavirus 2 by RT PCR (hospital order, performed in [facility] hospital lab) Nasopharyngeal Nasopharyngeal Swab     Status: None   Collection Time: 06/16/19  9:36 PM   Specimen: Nasopharyngeal Swab  Result Value Ref Range   SARS Coronavirus 2 NEGATIVE NEGATIVE    Comment: (NOTE) SARS-CoV-2 target nucleic acids are NOT DETECTED. The SARS-CoV-2 RNA is generally detectable in upper and lower respiratory specimens during the acute phase of infection. The lowest concentration of SARS-CoV-2 viral copies this assay can detect is 250 copies / mL. A negative result does not preclude SARS-CoV-2 infection and should not be used as the sole basis for treatment or other patient management decisions.  A negative result may occur with improper specimen collection / handling, submission of specimen other than nasopharyngeal swab, presence of viral mutation(s) within the areas targeted by this assay, and inadequate number of viral copies (<250 copies / mL). A negative result must be combined with clinical observations, patient history, and epidemiological information. Fact Sheet for Patients:   StrictlyIdeas.no Fact Sheet for Healthcare Providers: BankingDealers.co.za This test is not yet approved or cleared  by the [place] FDA and has been authorized for detection and/or diagnosis of SARS-CoV-2 by FDA under an Emergency Use Authorization (EUA).  This EUA will remain in effect (meaning this test can be used) for the duration of the COVID-19  declaration under Section 564(b)(1) of the Act, 21 U.S.C. section 360bbb-3(b)(1), unless the authorization is terminated or revoked sooner. Performed at [facility], [address] [place], [place] [address]   Glucose, capillary     Status: Abnormal   Collection Time: 06/16/19 11:15 PM  Result Value Ref Range   Glucose-Capillary 256 (H) 70 - 99 mg/dL    Comment: Glucose reference range applies only to samples taken after fasting for at least 8 hours.  HIV Antibody (routine testing w rflx)     Status: None   Collection Time: 06/17/19  5:13 AM  Result Value Ref Range   HIV Screen 4th Generation wRfx Non Reactive Non Reactive    Comment: Performed at [facility], [place] [address], [place],  [id number]  Basic metabolic panel     Status: Abnormal   Collection Time: 06/17/19  5:13 AM  Result Value Ref Range   Sodium 136 135 - 145 mmol/L   Potassium 3.4 (L) 3.5 - 5.1 mmol/L   Chloride 103 98 - 111 mmol/L   CO2 23 22 - 32 mmol/L   Glucose, Bld 274 (H) 70 - 99 mg/dL    Comment: Glucose reference range applies only to samples taken after fasting for at least 8 hours.   BUN 20 6 - 20 mg/dL   Creatinine, Ser  0.88 0.61 - 1.24 mg/dL   Calcium 8.7 (L) 8.9 - 10.3 mg/dL   GFR calc non Af Amer >60 >60 mL/min   GFR calc Af Amer >60 >60 mL/min   Anion gap 10 5 - 15    Comment: Performed at [facility], [address], [place], [place] [address]  CBC     Status: Abnormal   Collection Time: 06/17/19  5:13 AM  Result Value Ref Range   WBC 7.1 4.0 - 10.5 K/uL   RBC 4.21 (L) 4.22 - 5.81 MIL/uL   Hemoglobin 13.3 13.0 - 17.0 g/dL   HCT 38.1 (L) 39.0 - 52.0 %   MCV 90.5 80.0 - 100.0 fL   MCH 31.6 26.0 - 34.0 pg   MCHC 34.9 30.0 - 36.0 g/dL   RDW 13.0 11.5 - 15.5 %   Platelets 214 150 - 400 K/uL   nRBC 0.0 0.0 - 0.2 %    Comment: Performed at [facility], [address], [place], [place] [address]  Glucose, capillary     Status: Abnormal   Collection Time: 06/17/19  7:33 AM  Result  Value Ref Range   Glucose-Capillary 230 (H) 70 - 99 mg/dL    Comment: Glucose reference range applies only to samples taken after fasting for at least 8 hours.  Glucose, capillary     Status: Abnormal   Collection Time: 06/17/19 11:46 AM  Result Value Ref Range   Glucose-Capillary 256 (H) 70 - 99 mg/dL    Comment: Glucose reference range applies only to samples taken after fasting for at least 8 hours.    US ARTERIAL ABI (SCREENING LOWER EXTREMITY)  Result Date: 06/17/2019 CLINICAL DATA:  Right fourth toe ulcer. History of hypertension and diabetes. EXAM: NONINVASIVE PHYSIOLOGIC VASCULAR STUDY OF BILATERAL LOWER EXTREMITIES TECHNIQUE: Evaluation of both lower extremities were performed at rest, including calculation of ankle-brachial indices with single level Doppler, pressure and pulse volume recording. COMPARISON:  None. FINDINGS: Right ABI: 1.36 at the level of the dorsalis pedis artery. The posterior tibial artery is noncompressible. Left ABI: Both posterior tibial and dorsalis pedis arteries are noncompressible. Right Lower Extremity: Monophasic posterior tibial and biphasic dorsalis pedis waveforms Left Lower Extremity: Biphasic posterior tibial and dorsalis pedis waveforms 1.0-1.4 Normal IMPRESSION: The resting right ankle-brachial index is within normal limits at the level of the dorsalis pedis artery. Noninvasive evaluation is very limited bilaterally due to noncompressibility of the right posterior tibial artery and left posterior tibial and dorsalis pedis arteries. Electronically Signed   By: Aletta Edouard M.D.   On: 06/17/2019 11:18   DG Foot Complete Right  Result Date: 06/16/2019 CLINICAL DATA:  Diabetic ulcer fourth digit EXAM: RIGHT FOOT COMPLETE - 3+ VIEW COMPARISON:  None. FINDINGS: Frontal, oblique, and lateral views of the right foot are obtained. There are destructive changes of the fourth distal phalanx with overlying soft tissue swelling and subcutaneous gas, consistent with  osteomyelitis. No other acute or destructive bony abnormalities. Diffuse vascular calcifications are noted. IMPRESSION: 1. Osteomyelitis fourth distal phalanx. Electronically Signed   By: Randa Ngo M.D.   On: 06/16/2019 18:42    ROS:  Pertinent items are noted in HPI.  Blood pressure 129/81, pulse 89, temperature 98.2 F (36.8 C), temperature source Oral, resp. rate 16, height 5\' 9"  (1.753 m), weight 90.7 kg, SpO2 98 %. Physical Exam: Pleasant male in NAD Head is normocephalic, atraumatic Lungs:  CTA with with equal bs bilaterally Cor:RRR without s3, s4, murmurs Ext: Palpable femoral pulse  present. Faintly palpable right posterior tibial and right dorsalis pedis. Deformity of right fourth toe distal tuft with excessive skin fold present extending medially, soft eschar present distally on tuft.  Right foot films and noninvasive vascular study results reviewed  Assessment/Plan: Impression: Osteomyelitis of fourth toe, right Diabetes mellitus Plan: Will continue IV antibiotics for the next few days. He ultimately may require a right fourth toe amputation. I have discussed this with the patient and further management is pending his progress. Discussed with Dr. Roderic Palau.  Aviva Signs 06/17/2019, 2:05 PM

## 2019-06-17 NOTE — Plan of Care (Signed)

## 2019-06-18 LAB — GLUCOSE, CAPILLARY
Glucose-Capillary: 223 mg/dL — ABNORMAL HIGH (ref 70–99)
Glucose-Capillary: 217 mg/dL — ABNORMAL HIGH (ref 70–99)
Glucose-Capillary: 358 mg/dL — ABNORMAL HIGH (ref 70–99)
Glucose-Capillary: 293 mg/dL — ABNORMAL HIGH (ref 70–99)

## 2019-06-18 MED ORDER — INSULIN ASPART 100 UNIT/ML ~~LOC~~ SOLN
5.0000 [IU] | Freq: Three times a day (TID) | SUBCUTANEOUS | Status: DC
  Administered 2019-06-19 (×3): 5 [IU] via SUBCUTANEOUS

## 2019-06-18 MED ORDER — INSULIN GLARGINE 100 UNIT/ML ~~LOC~~ SOLN
30.0000 [IU] | Freq: Every day | SUBCUTANEOUS | Status: DC
  Administered 2019-06-18: 30 [IU] via SUBCUTANEOUS
  Filled 2019-06-18 (×2): qty 0.3

## 2019-06-18 NOTE — Progress Notes (Signed)
Subjective: No acute changes or complaints.  Objective: Vital signs in last 24 hours: Temp:  [98.2 F (36.8 C)-98.8 F (37.1 C)] 98.8 F (37.1 C) (05/29 0511) Pulse Rate:  [86-89] 86 (05/29 0511) Resp:  [16-18] 16 (05/29 0511) BP: (107-137)/(67-84) 107/67 (05/29 0511) SpO2:  [98 %-99 %] 98 % (05/29 0511) Last BM Date: 06/16/19  Intake/Output from previous day: 05/28 0701 - 05/29 0700 In: 640 [P.O.:240; IV Piggyback:400] Out: 350 [Urine:350] Intake/Output this shift: Total I/O In: -  Out: 600 [Urine:600]  General appearance: alert, cooperative and no distress Extremities: Slightly less erythema and swelling of right fourth toe.  Soft eschar is present on the distal tuft with serosanguineous drainage present.  No purulent drainage is noted.  No right foot swelling is noted.  Lab Results:  Recent Labs    06/16/19 2030 06/17/19 0513  WBC 9.2 7.1  HGB 14.4 13.3  HCT 41.6 38.1*  PLT 227 214   BMET Recent Labs    06/16/19 2030 06/17/19 0513  NA 136 136  K 3.8 3.4*  CL 103 103  CO2 22 23  GLUCOSE 301* 274*  BUN 20 20  CREATININE 1.19 0.88  CALCIUM 9.3 8.7*   PT/INR No results for input(s): LABPROT, INR in the last 72 hours.  Studies/Results: US ARTERIAL ABI (SCREENING LOWER EXTREMITY)  Result Date: 06/17/2019 CLINICAL DATA:  Right fourth toe ulcer. History of hypertension and diabetes. EXAM: NONINVASIVE PHYSIOLOGIC VASCULAR STUDY OF BILATERAL LOWER EXTREMITIES TECHNIQUE: Evaluation of both lower extremities were performed at rest, including calculation of ankle-brachial indices with single level Doppler, pressure and pulse volume recording. COMPARISON:  None. FINDINGS: Right ABI: 1.36 at the level of the dorsalis pedis artery. The posterior tibial artery is noncompressible. Left ABI: Both posterior tibial and dorsalis pedis arteries are noncompressible. Right Lower Extremity: Monophasic posterior tibial and biphasic dorsalis pedis waveforms Left Lower Extremity:  Biphasic posterior tibial and dorsalis pedis waveforms 1.0-1.4 Normal IMPRESSION: The resting right ankle-brachial index is within normal limits at the level of the dorsalis pedis artery. Noninvasive evaluation is very limited bilaterally due to noncompressibility of the right posterior tibial artery and left posterior tibial and dorsalis pedis arteries. Electronically Signed   By: Aletta Edouard M.D.   On: 06/17/2019 11:18   DG Foot Complete Right  Result Date: 06/16/2019 CLINICAL DATA:  Diabetic ulcer fourth digit EXAM: RIGHT FOOT COMPLETE - 3+ VIEW COMPARISON:  None. FINDINGS: Frontal, oblique, and lateral views of the right foot are obtained. There are destructive changes of the fourth distal phalanx with overlying soft tissue swelling and subcutaneous gas, consistent with osteomyelitis. No other acute or destructive bony abnormalities. Diffuse vascular calcifications are noted. IMPRESSION: 1. Osteomyelitis fourth distal phalanx. Electronically Signed   By: Randa Ngo M.D.   On: 06/16/2019 18:42    Anti-infectives: Anti-infectives (From admission, onward)   Start     Dose/Rate Route Frequency Ordered Stop   06/16/19 2200  metroNIDAZOLE (FLAGYL) IVPB 500 mg     500 mg 100 mL/hr over 60 Minutes Intravenous Every 8 hours 06/16/19 2112     06/16/19 2145  cefTRIAXone (ROCEPHIN) 2 g in sodium chloride 0.9 % 100 mL IVPB     2 g 200 mL/hr over 30 Minutes Intravenous Every 24 hours 06/16/19 2112        Assessment/Plan: Impression: Right fourth toe osteomyelitis, slightly improved. Plan: I did talk to the patient about the possible need for amputation of the right fourth toe.  He is somewhat hesitant  to proceed with this at this point.  No need for urgent amputation.  Continue IV antibiotics.  Will reassess in the morning concerning surgery during this admission versus discharge home.  LOS: 2 days    Aviva Signs 06/18/2019

## 2019-06-18 NOTE — Progress Notes (Signed)
PROGRESS NOTE    [name]  [id number] DOB: 03/07/1960 DOA: 06/16/2019 PCP: [name], MD    Brief Narrative:  HPI:  [name]  is a 59 y.o. male, past medical history of hypertension, diabetes mellitus, insulin-dependent, GERD, arthritis, patient presents to ED secondary to fourth digit right lower extremity wound, reports wound has been progressive over last 2 weeks, did start after he was cutting the nail too close to the skin, then he had progressive erythema, swelling, and pain, and with some drainage to the toe and discoloration, he does report he has some diabetic neuropathy as well some decreased sensation, he denies any fever, chills, nausea, vomiting, cough, reports he was soaking his toe daily and Epson salt, with no much help.   Assessment & Plan:   Active Problems:   DM type 2 (diabetes mellitus, type 2) (HCC)   HTN (hypertension)   Osteomyelitis due to type 2 diabetes mellitus ([place])   Osteomyelitis of fourth toe of right foot ([place])   1. Osteomyelitis of fourth toe of right foot.  Currently on IV antibiotics.  Seen by general surgery.  Will likely need amputation of fourth toe.  ABIs have been ordered.  Resting ABI on the right is reported within normal limits 2. DM 2, insulin dependent with hyperglycemia.  Increase Lantus to 30 units.  At NovoLog 5 units with meals.  Continue on sliding scale insulin. 3. Hypertension.  On diltiazem and losartan.  Blood pressure currently stable. 4. Diabetic neuropathy.  Continue gabapentin 5. GERD.  Continue PPI.   DVT prophylaxis: Lovenox Code Status: Full code Family Communication: Discussed with patient Disposition Plan: Status is: Inpatient  Remains inpatient appropriate because:Ongoing diagnostic testing needed not appropriate for outpatient work up patient will likely need amputation of fourth digit.  General surgery.   Dispo: The patient is from: Home              Anticipated d/c is to: Home      Anticipated d/c date is: > 3 days              Patient currently is not medically stable to d/c.          Consultants:   General surgery  Procedures:     Antimicrobials:   Ceftriaxone 5/27>  Flagyl 5/27>   Subjective: Overall feels that pain is only radiating up the leg.  Has noticed some discharge from his wound.  Objective: Vitals:   06/17/19 1400 06/17/19 2051 06/18/19 0511 06/18/19 1700  BP: 129/81 137/84 107/67 128/83  Pulse: 89 89 86 86  Resp: 16 18 16 16   Temp: 98.2 F (36.8 C) 98.4 F (36.9 C) 98.8 F (37.1 C)   TempSrc: Oral Oral Oral   SpO2: 98% 99% 98% 99%  Weight:      Height:        Intake/Output Summary (Last 24 hours) at 06/18/2019 2005 Last data filed at 06/18/2019 1900 Gross per 24 hour  Intake 1640 ml  Output 1750 ml  Net -110 ml   Filed Weights   06/16/19 1739  Weight: 90.7 kg    Examination:  General exam: Appears calm and comfortable  Respiratory system: Clear to auscultation. Respiratory effort normal. Cardiovascular system: S1 & S2 heard, RRR. No JVD, murmurs, rubs, gallops or currently on antibiotics.  Seen by general surgery acute no pedal edema. Gastrointestinal system: Abdomen is nondistended, soft and nontender. No organomegaly or masses felt. Normal bowel sounds heard. Central nervous system: Alert and  oriented. No focal neurological deficits. Extremities: Symmetric 5 x 5 power. Skin: Continue erythema fourth right toe with soft eschar over tuft of fourth digit Psychiatry: Judgement and insight appear normal. Mood & affect appropriate.     Data Reviewed: I have personally reviewed following labs and imaging studies  CBC: Recent Labs  Lab 06/16/19 2030 06/17/19 0513  WBC 9.2 7.1  NEUTROABS 5.7  --   HGB 14.4 13.3  HCT 41.6 38.1*  MCV 90.2 90.5  PLT 227 [id number]   Basic Metabolic Panel: Recent Labs  Lab 06/16/19 2030 06/17/19 0513  NA 136 136  K 3.8 3.4*  CL 103 103  CO2 22 23  GLUCOSE 301* 274*  BUN  20 20  CREATININE 1.19 0.88  CALCIUM 9.3 8.7*   GFR: Estimated Creatinine Clearance: 103.1 mL/min (by C-G formula based on SCr of 0.88 mg/dL). Liver Function Tests: Recent Labs  Lab 06/16/19 2030  AST 20  ALT 26  ALKPHOS 93  BILITOT 0.5  PROT 7.5  ALBUMIN 4.1   No results for input(s): LIPASE, AMYLASE in the last 168 hours. No results for input(s): AMMONIA in the last 168 hours. Coagulation Profile: No results for input(s): INR, PROTIME in the last 168 hours. Cardiac Enzymes: No results for input(s): CKTOTAL, CKMB, CKMBINDEX, TROPONINI in the last 168 hours. BNP (last 3 results) No results for input(s): PROBNP in the last 8760 hours. HbA1C: Recent Labs    06/16/19 2030  HGBA1C 9.2*   CBG: Recent Labs  Lab 06/17/19 1636 06/17/19 2049 06/18/19 0751 06/18/19 1206 06/18/19 1711  GLUCAP 249* 257* 223* 217* 358*   Lipid Profile: No results for input(s): CHOL, HDL, LDLCALC, TRIG, CHOLHDL, LDLDIRECT in the last 72 hours. Thyroid Function Tests: No results for input(s): TSH, T4TOTAL, FREET4, T3FREE, THYROIDAB in the last 72 hours. Anemia Panel: No results for input(s): VITAMINB12, FOLATE, FERRITIN, TIBC, IRON, RETICCTPCT in the last 72 hours. Sepsis Labs: Recent Labs  Lab 06/16/19 2030  LATICACIDVEN 1.4    Recent Results (from the past 240 hour(s))  Blood Cultures x 2 sites     Status: None (Preliminary result)   Collection Time: 06/16/19  8:24 PM   Specimen: BLOOD LEFT HAND  Result Value Ref Range Status   Specimen Description BLOOD LEFT HAND  Final   Special Requests   Final    BOTTLES DRAWN AEROBIC AND ANAEROBIC Blood Culture results may not be optimal due to an inadequate volume of blood received in culture bottles   Culture   Final    NO GROWTH 2 DAYS Performed at [facility], [address], [place], [place] [address]    Report Status PENDING  Incomplete  Blood Cultures x 2 sites     Status: None (Preliminary result)   Collection Time: 06/16/19  8:30  PM   Specimen: BLOOD LEFT ARM  Result Value Ref Range Status   Specimen Description BLOOD LEFT ARM  Final   Special Requests   Final    BOTTLES DRAWN AEROBIC AND ANAEROBIC Blood Culture results may not be optimal due to an inadequate volume of blood received in culture bottles   Culture   Final    NO GROWTH 2 DAYS Performed at [facility], [address] [place], [place] [address]    Report Status PENDING  Incomplete  SARS Coronavirus 2 by RT PCR (hospital order, performed in [facility] hospital lab) Nasopharyngeal Nasopharyngeal Swab     Status: None   Collection Time: 06/16/19  9:36 PM  Specimen: Nasopharyngeal Swab  Result Value Ref Range Status   SARS Coronavirus 2 NEGATIVE NEGATIVE Final    Comment: (NOTE) SARS-CoV-2 target nucleic acids are NOT DETECTED. The SARS-CoV-2 RNA is generally detectable in upper and lower respiratory specimens during the acute phase of infection. The lowest concentration of SARS-CoV-2 viral copies this assay can detect is 250 copies / mL. A negative result does not preclude SARS-CoV-2 infection and should not be used as the sole basis for treatment or other patient management decisions.  A negative result may occur with improper specimen collection / handling, submission of specimen other than nasopharyngeal swab, presence of viral mutation(s) within the areas targeted by this assay, and inadequate number of viral copies (<250 copies / mL). A negative result must be combined with clinical observations, patient history, and epidemiological information. Fact Sheet for Patients:   StrictlyIdeas.no Fact Sheet for Healthcare Providers: BankingDealers.co.za This test is not yet approved or cleared  by the [place] FDA and has been authorized for detection and/or diagnosis of SARS-CoV-2 by FDA under an Emergency Use Authorization (EUA).  This EUA will remain in effect (meaning this test can be used)  for the duration of the COVID-19 declaration under Section 564(b)(1) of the Act, 21 U.S.C. section 360bbb-3(b)(1), unless the authorization is terminated or revoked sooner. Performed at [facility], [address], [place], [place] [address]          Radiology Studies: US ARTERIAL ABI (SCREENING LOWER EXTREMITY)  Result Date: 06/17/2019 CLINICAL DATA:  Right fourth toe ulcer. History of hypertension and diabetes. EXAM: NONINVASIVE PHYSIOLOGIC VASCULAR STUDY OF BILATERAL LOWER EXTREMITIES TECHNIQUE: Evaluation of both lower extremities were performed at rest, including calculation of ankle-brachial indices with single level Doppler, pressure and pulse volume recording. COMPARISON:  None. FINDINGS: Right ABI: 1.36 at the level of the dorsalis pedis artery. The posterior tibial artery is noncompressible. Left ABI: Both posterior tibial and dorsalis pedis arteries are noncompressible. Right Lower Extremity: Monophasic posterior tibial and biphasic dorsalis pedis waveforms Left Lower Extremity: Biphasic posterior tibial and dorsalis pedis waveforms 1.0-1.4 Normal IMPRESSION: The resting right ankle-brachial index is within normal limits at the level of the dorsalis pedis artery. Noninvasive evaluation is very limited bilaterally due to noncompressibility of the right posterior tibial artery and left posterior tibial and dorsalis pedis arteries. Electronically Signed   By: Aletta Edouard M.D.   On: 06/17/2019 11:18        Scheduled Meds: . aspirin EC  81 mg Oral Daily  . buPROPion  100 mg Oral BID  . chlorthalidone  12.5 mg Oral Daily  . diltiazem (CARDIZEM CD) 24 hr capsule 420 mg  420 mg Oral Daily  . enoxaparin (LOVENOX) injection  40 mg Subcutaneous Q24H  . gabapentin  400 mg Oral TID  . insulin aspart  0-15 Units Subcutaneous TID WC  . insulin aspart  0-5 Units Subcutaneous QHS  . insulin glargine  25 Units Subcutaneous QHS  . losartan  100 mg Oral Daily  . multivitamin with minerals   1 tablet Oral Daily  . nutrition supplement (JUVEN)  1 packet Oral BID BM  . pantoprazole  40 mg Oral Daily  . Ensure Max Protein  11 oz Oral BID   Continuous Infusions: . cefTRIAXone (ROCEPHIN)  IV 2 g (06/17/19 2141)   And  . metronidazole 500 mg (06/18/19 1435)     LOS: 2 days    Time spent: 30 mins    Kathie Dike, MD [facility]  If 7PM-7AM, please contact night-coverage [email or web address]  06/18/2019, 8:05 PM

## 2019-06-19 LAB — GLUCOSE, CAPILLARY
Glucose-Capillary: 202 mg/dL — ABNORMAL HIGH (ref 70–99)
Glucose-Capillary: 236 mg/dL — ABNORMAL HIGH (ref 70–99)
Glucose-Capillary: 257 mg/dL — ABNORMAL HIGH (ref 70–99)
Glucose-Capillary: 249 mg/dL — ABNORMAL HIGH (ref 70–99)

## 2019-06-19 MED ORDER — INSULIN GLARGINE 100 UNIT/ML ~~LOC~~ SOLN
35.0000 [IU] | Freq: Every day | SUBCUTANEOUS | Status: DC
  Administered 2019-06-19 – 2019-06-21 (×3): 35 [IU] via SUBCUTANEOUS
  Filled 2019-06-19 (×4): qty 0.35

## 2019-06-19 MED ORDER — INSULIN ASPART 100 UNIT/ML ~~LOC~~ SOLN
8.0000 [IU] | Freq: Three times a day (TID) | SUBCUTANEOUS | Status: DC
  Administered 2019-06-20 – 2019-06-22 (×7): 8 [IU] via SUBCUTANEOUS

## 2019-06-19 NOTE — Progress Notes (Signed)
  Subjective: Still with some pain in the right fourth toe.  Objective: Vital signs in last 24 hours: Temp:  [97.5 F (36.4 C)-97.9 F (36.6 C)] 97.5 F (36.4 C) (05/30 0511) Pulse Rate:  [85-93] 85 (05/30 0511) Resp:  [16-20] 18 (05/30 0511) BP: (128-159)/(83-93) 141/93 (05/30 0511) SpO2:  [99 %-100 %] 100 % (05/30 0511) Last BM Date: 06/17/19  Intake/Output from previous day: 05/29 0701 - 05/30 0700 In: 1740 [P.O.:1440; IV Piggyback:300] Out: 2200 [Urine:2200] Intake/Output this shift: No intake/output data recorded.  General appearance: alert, cooperative and no distress Extremities:   Soft eschar present on distal tuft of right fourth toe.  Erythema still present.  Foot without erythema or edema.  Lab Results:  Recent Labs    06/16/19 2030 06/17/19 0513  WBC 9.2 7.1  HGB 14.4 13.3  HCT 41.6 38.1*  PLT 227 214   BMET Recent Labs    06/16/19 2030 06/17/19 0513  NA 136 136  K 3.8 3.4*  CL 103 103  CO2 22 23  GLUCOSE 301* 274*  BUN 20 20  CREATININE 1.19 0.88  CALCIUM 9.3 8.7*   PT/INR No results for input(s): LABPROT, INR in the last 72 hours.  Studies/Results: US ARTERIAL ABI (SCREENING LOWER EXTREMITY)  Result Date: 06/17/2019 CLINICAL DATA:  Right fourth toe ulcer. History of hypertension and diabetes. EXAM: NONINVASIVE PHYSIOLOGIC VASCULAR STUDY OF BILATERAL LOWER EXTREMITIES TECHNIQUE: Evaluation of both lower extremities were performed at rest, including calculation of ankle-brachial indices with single level Doppler, pressure and pulse volume recording. COMPARISON:  None. FINDINGS: Right ABI: 1.36 at the level of the dorsalis pedis artery. The posterior tibial artery is noncompressible. Left ABI: Both posterior tibial and dorsalis pedis arteries are noncompressible. Right Lower Extremity: Monophasic posterior tibial and biphasic dorsalis pedis waveforms Left Lower Extremity: Biphasic posterior tibial and dorsalis pedis waveforms 1.0-1.4 Normal  IMPRESSION: The resting right ankle-brachial index is within normal limits at the level of the dorsalis pedis artery. Noninvasive evaluation is very limited bilaterally due to noncompressibility of the right posterior tibial artery and left posterior tibial and dorsalis pedis arteries. Electronically Signed   By: Aletta Edouard M.D.   On: 06/17/2019 11:18    Anti-infectives: Anti-infectives (From admission, onward)   Start     Dose/Rate Route Frequency Ordered Stop   06/16/19 2200  metroNIDAZOLE (FLAGYL) IVPB 500 mg     500 mg 100 mL/hr over 60 Minutes Intravenous Every 8 hours 06/16/19 2112     06/16/19 2145  cefTRIAXone (ROCEPHIN) 2 g in sodium chloride 0.9 % 100 mL IVPB     2 g 200 mL/hr over 30 Minutes Intravenous Every 24 hours 06/16/19 2112        Assessment/Plan: Imp: Osteomyelitis, right fourth toe, diabetes mellitus. Plan:  Will proceed with right fourth toe amputation on 06/21/2019.  We will continue IV antibiotics for now.  Discussed with Dr. Roderic Palau.  LOS: 3 days    Aviva Signs 06/19/2019

## 2019-06-19 NOTE — TOC Initial Note (Signed)
Transition of Care [facility]) - Initial/Assessment Note    Patient Details  Name: [name] MRN: [id number] Date of Birth: [birth year]  Transition of Care [facility]) CM/SW Contact:    Natasha Bence, LCSW Phone Number: 06/19/2019, 10:40 AM  Clinical Narrative: Patient is a 59 year old male who was admitted for Osteomyelitis of fourth toe of right foot due to type 2 diabetes. TOC received consult for possible HH/DME and medication assistance needs. Patient lives with a roommate who assists him occasionally with ADL's due to back surgery he had approximately 1.5 years ago. Patient currently utilizes a cane as needed and reports no other DME equipment. Patient states he has no history of receiving HH and is able to afford medications. Patient reports he will have surgery he will have surgery for toe amputation on 06/21/2019. TOC to follow for potential needs.   Expected Discharge Plan: (P) Home/Self Care Barriers to Discharge: Continued Medical Work up  Expected Discharge Plan and Services Expected Discharge Plan: (P) Home/Self Care Living arrangements for the past 2 months: Single Family Home DME Arranged: N/A DME Agency: NA HH Arranged: NA [place] Agency: NA  Prior Living Arrangements/Services Living arrangements for the past 2 months: [place] Lives with:: Roommate Patient language and need for interpreter reviewed:: Yes Do you feel safe going back to the place where you live?: Yes      Need for Family Participation in Patient Care: No (Comment) Care giver support system in place?: Yes (comment)(Friend assist with ADL's.) Current home services: DME(Cane) Criminal Activity/Legal Involvement Pertinent to Current Situation/Hospitalization: No - Comment as needed  Activities of Daily Living Home Assistive Devices/Equipment: Cane (specify quad or straight), CBG Meter, Blood pressure cuff ADL Screening (condition at time of admission) Patient's cognitive ability adequate to safely  complete daily activities?: Yes Is the patient deaf or have difficulty hearing?: No Does the patient have difficulty seeing, even when wearing glasses/contacts?: No Does the patient have difficulty concentrating, remembering, or making decisions?: No Patient able to express need for assistance with ADLs?: Yes Does the patient have difficulty dressing or bathing?: No Independently performs ADLs?: Yes (appropriate for developmental age) Does the patient have difficulty walking or climbing stairs?: Yes Weakness of Legs: Both Weakness of Arms/Hands: Both  Emotional Assessment   Attitude/Demeanor/Rapport: Engaged Affect (typically observed): Accepting, Appropriate Orientation: : Oriented to Self, Oriented to Place, Oriented to  Time, Oriented to Situation Alcohol / Substance Use: Not Applicable Psych Involvement: No (comment)  Admission diagnosis:  Osteomyelitis (HCC) [M86.9] Pain [R52] Hyperglycemia [R73.9] Osteomyelitis of fourth toe of right foot ([place]) [M86.9] Patient Active Problem List   Diagnosis Date Noted  . Osteomyelitis due to type 2 diabetes mellitus ([place]) 06/16/2019  . Osteomyelitis of fourth toe of right foot ([place]) 06/16/2019  . Acute biliary pancreatitis 10/23/2013  . Biliary colic [id number]  . Abdominal pain, right upper quadrant 10/21/2013  . Esophageal ulcer without bleeding 08/26/2010  . HTN (hypertension) 08/26/2010  . DM type 2 (diabetes mellitus, type 2) ([place]) 08/25/2010  . Chronic pain 08/25/2010   PCP:  [name], MD Pharmacy:   [facility] [place], [place] AT [employer] [id number] [place] [address] Phone: [phone] Fax: [phone]  Readmission Risk Interventions No flowsheet data found.

## 2019-06-19 NOTE — Progress Notes (Signed)
PROGRESS NOTE    [name]  [id number] DOB: 18-Mar-1960 DOA: 06/16/2019 PCP: [name], MD    Brief Narrative:  HPI:  [name]  is a 59 y.o. male, past medical history of hypertension, diabetes mellitus, insulin-dependent, GERD, arthritis, patient presents to ED secondary to fourth digit right lower extremity wound, reports wound has been progressive over last 2 weeks, did start after he was cutting the nail too close to the skin, then he had progressive erythema, swelling, and pain, and with some drainage to the toe and discoloration, he does report he has some diabetic neuropathy as well some decreased sensation, he denies any fever, chills, nausea, vomiting, cough, reports he was soaking his toe daily and Epson salt, with no much help.   Assessment & Plan:   Active Problems:   DM type 2 (diabetes mellitus, type 2) (HCC)   HTN (hypertension)   Osteomyelitis due to type 2 diabetes mellitus ([place])   Osteomyelitis of fourth toe of right foot ([place])   1. Osteomyelitis of fourth toe of right foot.  Currently on IV antibiotics.  Seen by general surgery.  Plans for 4th toe amputation on 6/1.  ABIs have been ordered.  Resting ABI on the right is reported within normal limits 2. DM 2, insulin dependent with hyperglycemia.  Increase Lantus to 35 units.  Increase NovoLog to 8 units with meals.  Continue on sliding scale insulin. 3. Hypertension.  On diltiazem and losartan.  Blood pressure currently stable. 4. Diabetic neuropathy.  Continue gabapentin 5. GERD.  Continue PPI.   DVT prophylaxis: Lovenox Code Status: Full code Family Communication: Discussed with patient Disposition Plan: Status is: Inpatient  Remains inpatient appropriate because:Ongoing diagnostic testing needed not appropriate for outpatient work up patient will likely need amputation of fourth digit.  General surgery.   Dispo: The patient is from: Home              Anticipated d/c is to: Home         Anticipated d/c date is: > 3 days              Patient currently is not medically stable to d/c.          Consultants:   General surgery  Procedures:     Antimicrobials:   Ceftriaxone 5/27>  Flagyl 5/27>   Subjective: He has noted some discharge from his toe. No pain.   Objective: Vitals:   06/18/19 1700 06/18/19 2101 06/19/19 0511 06/19/19 1448  BP: 128/83 (!) 159/91 (!) 141/93 126/86  Pulse: 86 93 85 85  Resp: 16 20 18 16   Temp:  97.9 F (36.6 C) (!) 97.5 F (36.4 C) 98.5 F (36.9 C)  TempSrc:  Oral Oral Oral  SpO2: 99% 99% 100% 100%  Weight:      Height:        Intake/Output Summary (Last 24 hours) at 06/19/2019 1745 Last data filed at 06/19/2019 1448 Gross per 24 hour  Intake 1500 ml  Output 1400 ml  Net 100 ml   Filed Weights   06/16/19 1739  Weight: 90.7 kg    Examination:  General exam: Appears calm and comfortable  Respiratory system: Clear to auscultation. Respiratory effort normal. Cardiovascular system: S1 & S2 heard, RRR. No JVD, murmurs, rubs, gallops or currently on antibiotics.  Seen by general surgery acute no pedal edema. Gastrointestinal system: Abdomen is nondistended, soft and nontender. No organomegaly or masses felt. Normal bowel sounds heard. Central nervous system: Alert and  oriented. No focal neurological deficits. Extremities: Symmetric 5 x 5 power. Skin: Continue erythema fourth right toe with soft eschar over tuft of fourth digit Psychiatry: Judgement and insight appear normal. Mood & affect appropriate.     Data Reviewed: I have personally reviewed following labs and imaging studies  CBC: Recent Labs  Lab 06/16/19 2030 06/17/19 0513  WBC 9.2 7.1  NEUTROABS 5.7  --   HGB 14.4 13.3  HCT 41.6 38.1*  MCV 90.2 90.5  PLT 227 [id number]   Basic Metabolic Panel: Recent Labs  Lab 06/16/19 2030 06/17/19 0513  NA 136 136  K 3.8 3.4*  CL 103 103  CO2 22 23  GLUCOSE 301* 274*  BUN 20 20  CREATININE 1.19 0.88    CALCIUM 9.3 8.7*   GFR: Estimated Creatinine Clearance: 103.1 mL/min (by C-G formula based on SCr of 0.88 mg/dL). Liver Function Tests: Recent Labs  Lab 06/16/19 2030  AST 20  ALT 26  ALKPHOS 93  BILITOT 0.5  PROT 7.5  ALBUMIN 4.1   No results for input(s): LIPASE, AMYLASE in the last 168 hours. No results for input(s): AMMONIA in the last 168 hours. Coagulation Profile: No results for input(s): INR, PROTIME in the last 168 hours. Cardiac Enzymes: No results for input(s): CKTOTAL, CKMB, CKMBINDEX, TROPONINI in the last 168 hours. BNP (last 3 results) No results for input(s): PROBNP in the last 8760 hours. HbA1C: Recent Labs    06/16/19 2030  HGBA1C 9.2*   CBG: Recent Labs  Lab 06/18/19 1711 06/18/19 2104 06/19/19 0811 06/19/19 1149 06/19/19 1654  GLUCAP 358* 293* 202* 236* 257*   Lipid Profile: No results for input(s): CHOL, HDL, LDLCALC, TRIG, CHOLHDL, LDLDIRECT in the last 72 hours. Thyroid Function Tests: No results for input(s): TSH, T4TOTAL, FREET4, T3FREE, THYROIDAB in the last 72 hours. Anemia Panel: No results for input(s): VITAMINB12, FOLATE, FERRITIN, TIBC, IRON, RETICCTPCT in the last 72 hours. Sepsis Labs: Recent Labs  Lab 06/16/19 2030  LATICACIDVEN 1.4    Recent Results (from the past 240 hour(s))  Blood Cultures x 2 sites     Status: None (Preliminary result)   Collection Time: 06/16/19  8:24 PM   Specimen: BLOOD LEFT HAND  Result Value Ref Range Status   Specimen Description BLOOD LEFT HAND  Final   Special Requests   Final    BOTTLES DRAWN AEROBIC AND ANAEROBIC Blood Culture results may not be optimal due to an inadequate volume of blood received in culture bottles   Culture   Final    NO GROWTH 3 DAYS Performed at [facility], [address] [place], [place] [address]    Report Status PENDING  Incomplete  Blood Cultures x 2 sites     Status: None (Preliminary result)   Collection Time: 06/16/19  8:30 PM   Specimen: BLOOD LEFT ARM   Result Value Ref Range Status   Specimen Description BLOOD LEFT ARM  Final   Special Requests   Final    BOTTLES DRAWN AEROBIC AND ANAEROBIC Blood Culture results may not be optimal due to an inadequate volume of blood received in culture bottles   Culture   Final    NO GROWTH 3 DAYS Performed at [facility], [address], [place],  [address]    Report Status PENDING  Incomplete  SARS Coronavirus 2 by RT PCR (hospital order, performed in [place] hospital lab) Nasopharyngeal Nasopharyngeal Swab     Status: None   Collection Time: 06/16/19  9:36 PM  Specimen: Nasopharyngeal Swab  Result Value Ref Range Status   SARS Coronavirus 2 NEGATIVE NEGATIVE Final    Comment: (NOTE) SARS-CoV-2 target nucleic acids are NOT DETECTED. The SARS-CoV-2 RNA is generally detectable in upper and lower respiratory specimens during the acute phase of infection. The lowest concentration of SARS-CoV-2 viral copies this assay can detect is 250 copies / mL. A negative result does not preclude SARS-CoV-2 infection and should not be used as the sole basis for treatment or other patient management decisions.  A negative result may occur with improper specimen collection / handling, submission of specimen other than nasopharyngeal swab, presence of viral mutation(s) within the areas targeted by this assay, and inadequate number of viral copies (<250 copies / mL). A negative result must be combined with clinical observations, patient history, and epidemiological information. Fact Sheet for Patients:   StrictlyIdeas.no Fact Sheet for Healthcare Providers: BankingDealers.co.za This test is not yet approved or cleared  by the [place] FDA and has been authorized for detection and/or diagnosis of SARS-CoV-2 by FDA under an Emergency Use Authorization (EUA).  This EUA will remain in effect (meaning this test can be used) for the duration of the COVID-19  declaration under Section 564(b)(1) of the Act, 21 U.S.C. section 360bbb-3(b)(1), unless the authorization is terminated or revoked sooner. Performed at [facility], [address] [place], [place] [address]          Radiology Studies: No results found.      Scheduled Meds: . aspirin EC  81 mg Oral Daily  . buPROPion  100 mg Oral BID  . chlorthalidone  12.5 mg Oral Daily  . diltiazem (CARDIZEM CD) 24 hr capsule 420 mg  420 mg Oral Daily  . enoxaparin (LOVENOX) injection  40 mg Subcutaneous Q24H  . gabapentin  400 mg Oral TID  . insulin aspart  0-15 Units Subcutaneous TID WC  . insulin aspart  0-5 Units Subcutaneous QHS  . insulin aspart  5 Units Subcutaneous TID WC  . insulin glargine  30 Units Subcutaneous QHS  . losartan  100 mg Oral Daily  . multivitamin with minerals  1 tablet Oral Daily  . nutrition supplement (JUVEN)  1 packet Oral BID BM  . pantoprazole  40 mg Oral Daily  . Ensure Max Protein  11 oz Oral BID   Continuous Infusions: . cefTRIAXone (ROCEPHIN)  IV 2 g (06/18/19 2138)   And  . metronidazole 500 mg (06/19/19 1542)     LOS: 3 days    Time spent: 30 mins    Kathie Dike, MD [facility]   If 7PM-7AM, please contact night-coverage [email or web address]  06/19/2019, 5:45 PM

## 2019-06-20 LAB — GLUCOSE, CAPILLARY
Glucose-Capillary: 228 mg/dL — ABNORMAL HIGH (ref 70–99)
Glucose-Capillary: 177 mg/dL — ABNORMAL HIGH (ref 70–99)
Glucose-Capillary: 165 mg/dL — ABNORMAL HIGH (ref 70–99)
Glucose-Capillary: 159 mg/dL — ABNORMAL HIGH (ref 70–99)

## 2019-06-20 LAB — BASIC METABOLIC PANEL
Anion gap: 8 (ref 5–15)
BUN: 19 mg/dL (ref 6–20)
CO2: 27 mmol/L (ref 22–32)
Calcium: 9 mg/dL (ref 8.9–10.3)
Chloride: 103 mmol/L (ref 98–111)
Creatinine, Ser: 0.8 mg/dL (ref 0.61–1.24)
GFR calc Af Amer: >60 mL/min (ref >60)
GFR calc non Af Amer: >60 mL/min (ref >60)
Glucose, Bld: 226 mg/dL — ABNORMAL HIGH (ref 70–99)
Potassium: 3.6 mmol/L (ref 3.5–5.1)
Sodium: 138 mmol/L (ref 135–145)

## 2019-06-20 MED ORDER — CHLORHEXIDINE GLUCONATE CLOTH 2 % EX PADS
6.0000 | MEDICATED_PAD | Freq: Once | CUTANEOUS | Status: AC
  Administered 2019-06-20: 6 via TOPICAL

## 2019-06-20 MED ORDER — POLYETHYLENE GLYCOL 3350 17 G PO PACK
17.0000 g | PACK | Freq: Every day | ORAL | Status: DC | PRN
  Filled 2019-06-20: qty 1

## 2019-06-20 MED ORDER — CHLORHEXIDINE GLUCONATE CLOTH 2 % EX PADS
6.0000 | MEDICATED_PAD | Freq: Once | CUTANEOUS | Status: AC
  Administered 2019-06-21: 6 via TOPICAL

## 2019-06-20 NOTE — Progress Notes (Signed)
PROGRESS NOTE    STEIN BURGOS  [id number] DOB: 02-Aug-1960 DOA: 06/16/2019 PCP: [name], MD    Brief Narrative:  HPI:  [name]  is a 59 y.o. male, past medical history of hypertension, diabetes mellitus, insulin-dependent, GERD, arthritis, patient presents to ED secondary to fourth digit right lower extremity wound, reports wound has been progressive over last 2 weeks, did start after he was cutting the nail too close to the skin, then he had progressive erythema, swelling, and pain, and with some drainage to the toe and discoloration, he does report he has some diabetic neuropathy as well some decreased sensation, he denies any fever, chills, nausea, vomiting, cough, reports he was soaking his toe daily and Epson salt, with no much help.   Assessment & Plan:   Active Problems:   DM type 2 (diabetes mellitus, type 2) (HCC)   HTN (hypertension)   Osteomyelitis due to type 2 diabetes mellitus ([place])   Osteomyelitis of fourth toe of right foot ([place])   1. Osteomyelitis of fourth toe of right foot.  Currently on IV antibiotics.  Seen by general surgery.  Plans for 4th toe amputation on 6/1.  ABIs have been ordered.  Resting ABI on the right is reported within normal limits 2. DM 2, insulin dependent with hyperglycemia.  Blood sugars have improved.  Continue Lantus 35 units.  Continue NovoLog 8 units with meals.  Continue on sliding scale insulin. 3. Hypertension.  On diltiazem and losartan.  Blood pressure currently stable. 4. Diabetic neuropathy.  Continue gabapentin 5. GERD.  Continue PPI.   DVT prophylaxis: Lovenox Code Status: Full code Family Communication: Discussed with patient Disposition Plan: Status is: Inpatient  Remains inpatient appropriate because:Ongoing diagnostic testing needed not appropriate for outpatient work up patient will need amputation of fourth digit on 6/1.   Dispo: The patient is from: Home              Anticipated d/c is to: Home              Anticipated d/c date is: 2 days              Patient currently is not medically stable to d/c.    Consultants:   General surgery  Procedures:     Antimicrobials:   Ceftriaxone 5/27>  Flagyl 5/27>   Subjective: No significant pain at this time.  No shortness of breath.  Objective: Vitals:   06/19/19 2201 06/20/19 0539 06/20/19 1527 06/20/19 2014  BP: (!) 142/82 (!) 141/82 135/80   Pulse: 83 79 87   Resp: 20 18    Temp: 98.1 F (36.7 C) (!) 97.5 F (36.4 C) 98.2 F (36.8 C)   TempSrc: Oral Oral Oral   SpO2: 98% 99% 98% 98%  Weight:      Height:        Intake/Output Summary (Last 24 hours) at 06/20/2019 2057 Last data filed at 06/20/2019 1900 Gross per 24 hour  Intake 1500 ml  Output 1100 ml  Net 400 ml   Filed Weights   06/16/19 1739  Weight: 90.7 kg    Examination:  General exam: Alert, awake, oriented x 3 Respiratory system: Clear to auscultation. Respiratory effort normal. Cardiovascular system:RRR. No murmurs, rubs, gallops. Gastrointestinal system: Abdomen is nondistended, soft and nontender. No organomegaly or masses felt. Normal bowel sounds heard. Central nervous system: Alert and oriented. No focal neurological deficits. Extremities: No C/C/E, +pedal pulses Skin: Black eschar noted on distal tuft of fourth digit  on right foot Psychiatry: Judgement and insight appear normal. Mood & affect appropriate.      Data Reviewed: I have personally reviewed following labs and imaging studies  CBC: Recent Labs  Lab 06/16/19 2030 06/17/19 0513  WBC 9.2 7.1  NEUTROABS 5.7  --   HGB 14.4 13.3  HCT 41.6 38.1*  MCV 90.2 90.5  PLT 227 [id number]   Basic Metabolic Panel: Recent Labs  Lab 06/16/19 2030 06/17/19 0513 06/20/19 0553  NA 136 136 138  K 3.8 3.4* 3.6  CL 103 103 103  CO2 22 23 27   GLUCOSE 301* 274* 226*  BUN 20 20 19   CREATININE 1.19 0.88 0.80  CALCIUM 9.3 8.7* 9.0   GFR: Estimated Creatinine Clearance: 113.4 mL/min (by  C-G formula based on SCr of 0.8 mg/dL). Liver Function Tests: Recent Labs  Lab 06/16/19 2030  AST 20  ALT 26  ALKPHOS 93  BILITOT 0.5  PROT 7.5  ALBUMIN 4.1   No results for input(s): LIPASE, AMYLASE in the last 168 hours. No results for input(s): AMMONIA in the last 168 hours. Coagulation Profile: No results for input(s): INR, PROTIME in the last 168 hours. Cardiac Enzymes: No results for input(s): CKTOTAL, CKMB, CKMBINDEX, TROPONINI in the last 168 hours. BNP (last 3 results) No results for input(s): PROBNP in the last 8760 hours. HbA1C: No results for input(s): HGBA1C in the last 72 hours. CBG: Recent Labs  Lab 06/19/19 2151 06/20/19 0746 06/20/19 1217 06/20/19 1625 06/20/19 2024  GLUCAP 249* 228* 177* 165* 159*   Lipid Profile: No results for input(s): CHOL, HDL, LDLCALC, TRIG, CHOLHDL, LDLDIRECT in the last 72 hours. Thyroid Function Tests: No results for input(s): TSH, T4TOTAL, FREET4, T3FREE, THYROIDAB in the last 72 hours. Anemia Panel: No results for input(s): VITAMINB12, FOLATE, FERRITIN, TIBC, IRON, RETICCTPCT in the last 72 hours. Sepsis Labs: Recent Labs  Lab 06/16/19 2030  LATICACIDVEN 1.4    Recent Results (from the past 240 hour(s))  Blood Cultures x 2 sites     Status: None (Preliminary result)   Collection Time: 06/16/19  8:24 PM   Specimen: BLOOD LEFT HAND  Result Value Ref Range Status   Specimen Description BLOOD LEFT HAND  Final   Special Requests   Final    BOTTLES DRAWN AEROBIC AND ANAEROBIC Blood Culture results may not be optimal due to an inadequate volume of blood received in culture bottles   Culture   Final    NO GROWTH 4 DAYS Performed at [facility], [address], [place], [place] [address]    Report Status PENDING  Incomplete  Blood Cultures x 2 sites     Status: None (Preliminary result)   Collection Time: 06/16/19  8:30 PM   Specimen: BLOOD LEFT ARM  Result Value Ref Range Status   Specimen Description BLOOD LEFT ARM   Final   Special Requests   Final    BOTTLES DRAWN AEROBIC AND ANAEROBIC Blood Culture results may not be optimal due to an inadequate volume of blood received in culture bottles   Culture   Final    NO GROWTH 4 DAYS Performed at [facility] [address], [place], [place] [address]    Report Status PENDING  Incomplete  SARS Coronavirus 2 by RT PCR (hospital order, performed in [place] hospital lab) Nasopharyngeal Nasopharyngeal Swab     Status: None   Collection Time: 06/16/19  9:36 PM   Specimen: Nasopharyngeal Swab  Result Value Ref Range Status   SARS  Coronavirus 2 NEGATIVE NEGATIVE Final    Comment: (NOTE) SARS-CoV-2 target nucleic acids are NOT DETECTED. The SARS-CoV-2 RNA is generally detectable in upper and lower respiratory specimens during the acute phase of infection. The lowest concentration of SARS-CoV-2 viral copies this assay can detect is 250 copies / mL. A negative result does not preclude SARS-CoV-2 infection and should not be used as the sole basis for treatment or other patient management decisions.  A negative result may occur with improper specimen collection / handling, submission of specimen other than nasopharyngeal swab, presence of viral mutation(s) within the areas targeted by this assay, and inadequate number of viral copies (<250 copies / mL). A negative result must be combined with clinical observations, patient history, and epidemiological information. Fact Sheet for Patients:   StrictlyIdeas.no Fact Sheet for Healthcare Providers: BankingDealers.co.za This test is not yet approved or cleared  by the [place] FDA and has been authorized for detection and/or diagnosis of SARS-CoV-2 by FDA under an Emergency Use Authorization (EUA).  This EUA will remain in effect (meaning this test can be used) for the duration of the COVID-19 declaration under Section 564(b)(1) of the Act, 21 U.S.C. section  360bbb-3(b)(1), unless the authorization is terminated or revoked sooner. Performed at [facility], [address], [place], [place] [address]          Radiology Studies: No results found.      Scheduled Meds: . aspirin EC  81 mg Oral Daily  . buPROPion  100 mg Oral BID  . Chlorhexidine Gluconate Cloth  6 each Topical Once   And  . Chlorhexidine Gluconate Cloth  6 each Topical Once  . Chlorhexidine Gluconate Cloth  6 each Topical Once   And  . Chlorhexidine Gluconate Cloth  6 each Topical Once  . chlorthalidone  12.5 mg Oral Daily  . diltiazem (CARDIZEM CD) 24 hr capsule 420 mg  420 mg Oral Daily  . gabapentin  400 mg Oral TID  . insulin aspart  0-15 Units Subcutaneous TID WC  . insulin aspart  0-5 Units Subcutaneous QHS  . insulin aspart  8 Units Subcutaneous TID WC  . insulin glargine  35 Units Subcutaneous QHS  . losartan  100 mg Oral Daily  . multivitamin with minerals  1 tablet Oral Daily  . nutrition supplement (JUVEN)  1 packet Oral BID BM  . pantoprazole  40 mg Oral Daily  . Ensure Max Protein  11 oz Oral BID   Continuous Infusions: . cefTRIAXone (ROCEPHIN)  IV 2 g (06/19/19 2230)   And  . metronidazole Stopped (06/20/19 1430)     LOS: 4 days    Time spent: 30 mins    Kathie Dike, MD [facility]   If 7PM-7AM, please contact night-coverage [email or web address]  06/20/2019, 8:57 PM

## 2019-06-20 NOTE — H&P (View-Only) (Signed)
  Subjective: No changes over last 24 hours.  Objective: Vital signs in last 24 hours: Temp:  [97.5 F (36.4 C)-98.5 F (36.9 C)] 97.5 F (36.4 C) (05/31 0539) Pulse Rate:  [79-85] 79 (05/31 0539) Resp:  [16-20] 18 (05/31 0539) BP: (126-142)/(82-86) 141/82 (05/31 0539) SpO2:  [98 %-100 %] 99 % (05/31 0539) Last BM Date: 06/17/19  Intake/Output from previous day: 05/30 0701 - 05/31 0700 In: 1941.9 [P.O.:1440; IV Piggyback:501.9] Out: 1500 [Urine:1500] Intake/Output this shift: No intake/output data recorded.  General appearance: alert, cooperative and no distress Extremities: Right fourth toe with distal tuft eschar present.  No significant purulent drainage noted.  Erythema at base of right fourth toe resolving.  Lab Results:  No results for input(s): WBC, HGB, HCT, PLT in the last 72 hours. BMET Recent Labs    06/20/19 0553  NA 138  K 3.6  CL 103  CO2 27  GLUCOSE 226*  BUN 19  CREATININE 0.80  CALCIUM 9.0   PT/INR No results for input(s): LABPROT, INR in the last 72 hours.  Studies/Results: No results found.  Anti-infectives: Anti-infectives (From admission, onward)   Start     Dose/Rate Route Frequency Ordered Stop   06/16/19 2200  metroNIDAZOLE (FLAGYL) IVPB 500 mg     500 mg 100 mL/hr over 60 Minutes Intravenous Every 8 hours 06/16/19 2112     06/16/19 2145  cefTRIAXone (ROCEPHIN) 2 g in sodium chloride 0.9 % 100 mL IVPB     2 g 200 mL/hr over 30 Minutes Intravenous Every 24 hours 06/16/19 2112        Assessment/Plan: Impression: Osteomyelitis of right fourth toe Plan: Patient is scheduled for right fourth toe amputation on 06/21/2019.  The risks and benefits of the procedure including bleeding, infection, and wound breakdown were fully explained to the patient, who gave informed consent.  LOS: 4 days    Aviva Signs 06/20/2019

## 2019-06-20 NOTE — Progress Notes (Signed)
  Subjective: No changes over last 24 hours.  Objective: Vital signs in last 24 hours: Temp:  [97.5 F (36.4 C)-98.5 F (36.9 C)] 97.5 F (36.4 C) (05/31 0539) Pulse Rate:  [79-85] 79 (05/31 0539) Resp:  [16-20] 18 (05/31 0539) BP: (126-142)/(82-86) 141/82 (05/31 0539) SpO2:  [98 %-100 %] 99 % (05/31 0539) Last BM Date: 06/17/19  Intake/Output from previous day: 05/30 0701 - 05/31 0700 In: 1941.9 [P.O.:1440; IV Piggyback:501.9] Out: 1500 [Urine:1500] Intake/Output this shift: No intake/output data recorded.  General appearance: alert, cooperative and no distress Extremities: Right fourth toe with distal tuft eschar present.  No significant purulent drainage noted.  Erythema at base of right fourth toe resolving.  Lab Results:  No results for input(s): WBC, HGB, HCT, PLT in the last 72 hours. BMET Recent Labs    06/20/19 0553  NA 138  K 3.6  CL 103  CO2 27  GLUCOSE 226*  BUN 19  CREATININE 0.80  CALCIUM 9.0   PT/INR No results for input(s): LABPROT, INR in the last 72 hours.  Studies/Results: No results found.  Anti-infectives: Anti-infectives (From admission, onward)   Start     Dose/Rate Route Frequency Ordered Stop   06/16/19 2200  metroNIDAZOLE (FLAGYL) IVPB 500 mg     500 mg 100 mL/hr over 60 Minutes Intravenous Every 8 hours 06/16/19 2112     06/16/19 2145  cefTRIAXone (ROCEPHIN) 2 g in sodium chloride 0.9 % 100 mL IVPB     2 g 200 mL/hr over 30 Minutes Intravenous Every 24 hours 06/16/19 2112        Assessment/Plan: Impression: Osteomyelitis of right fourth toe Plan: Patient is scheduled for right fourth toe amputation on 06/21/2019.  The risks and benefits of the procedure including bleeding, infection, and wound breakdown were fully explained to the patient, who gave informed consent.  LOS: 4 days    Aviva Signs 06/20/2019

## 2019-06-21 ENCOUNTER — Inpatient Hospital Stay (HOSPITAL_COMMUNITY): Payer: Medicaid Other | Admitting: Anesthesiology

## 2019-06-21 ENCOUNTER — Encounter (HOSPITAL_COMMUNITY)
Admission: EM | Disposition: A | Discharge status: Home / Self Care | Payer: Self-pay | Source: Home / Self Care | Attending: Internal Medicine

## 2019-06-21 HISTORY — PX: AMPUTATION TOE: SHX6595

## 2019-06-21 LAB — CULTURE, BLOOD (ROUTINE X 2)
Culture: NO GROWTH
Culture: NO GROWTH

## 2019-06-21 LAB — GLUCOSE, CAPILLARY
Glucose-Capillary: 232 mg/dL — ABNORMAL HIGH (ref 70–99)
Glucose-Capillary: 183 mg/dL — ABNORMAL HIGH (ref 70–99)
Glucose-Capillary: 310 mg/dL — ABNORMAL HIGH (ref 70–99)
Glucose-Capillary: 388 mg/dL — ABNORMAL HIGH (ref 70–99)

## 2019-06-21 LAB — SURGICAL PCR SCREEN
MRSA, PCR: NEGATIVE
Staphylococcus aureus: NEGATIVE

## 2019-06-21 SURGERY — AMPUTATION, TOE
Anesthesia: General | Site: Toe | Laterality: Right

## 2019-06-21 MED ORDER — LACTATED RINGERS IV SOLN: INTRAVENOUS | Status: DC | PRN

## 2019-06-21 MED ORDER — MIDAZOLAM HCL 2 MG/2ML IJ SOLN
2.0000 mg | Freq: Once | INTRAMUSCULAR | Status: AC
  Administered 2019-06-21: 2 mg via INTRAVENOUS

## 2019-06-21 MED ORDER — MIDAZOLAM HCL 2 MG/2ML IJ SOLN
INTRAMUSCULAR | Status: AC
  Filled 2019-06-21: qty 2

## 2019-06-21 MED ORDER — FENTANYL CITRATE (PF) 100 MCG/2ML IJ SOLN
INTRAMUSCULAR | Status: DC | PRN
  Administered 2019-06-21 (×2): 100 ug via INTRAVENOUS

## 2019-06-21 MED ORDER — ORAL CARE MOUTH RINSE
15.0000 mL | Freq: Once | OROMUCOSAL | Status: AC
  Administered 2019-06-21: 15 mL via OROMUCOSAL

## 2019-06-21 MED ORDER — METOCLOPRAMIDE HCL 5 MG/ML IJ SOLN
INTRAMUSCULAR | Status: DC | PRN
  Administered 2019-06-21: 10 mg via INTRAVENOUS

## 2019-06-21 MED ORDER — CHLORHEXIDINE GLUCONATE 0.12 % MT SOLN
15.0000 mL | Freq: Once | OROMUCOSAL | Status: AC
  Administered 2019-06-21: 15 mL via OROMUCOSAL

## 2019-06-21 MED ORDER — ONDANSETRON HCL 4 MG/2ML IJ SOLN
INTRAMUSCULAR | Status: DC | PRN
  Administered 2019-06-21: 4 mg via INTRAVENOUS

## 2019-06-21 MED ORDER — HYDROMORPHONE HCL 1 MG/ML IJ SOLN
INTRAMUSCULAR | Status: AC
  Filled 2019-06-21: qty 0.5

## 2019-06-21 MED ORDER — LIDOCAINE HCL (CARDIAC) PF 100 MG/5ML IV SOSY
PREFILLED_SYRINGE | INTRAVENOUS | Status: DC | PRN
  Administered 2019-06-21: 100 mg via INTRAVENOUS

## 2019-06-21 MED ORDER — KETOROLAC TROMETHAMINE 30 MG/ML IJ SOLN
30.0000 mg | Freq: Once | INTRAMUSCULAR | Status: DC
  Filled 2019-06-21: qty 1

## 2019-06-21 MED ORDER — PHENYLEPHRINE HCL (PRESSORS) 10 MG/ML IV SOLN
INTRAVENOUS | Status: DC | PRN
  Administered 2019-06-21 (×5): 80 ug via INTRAVENOUS

## 2019-06-21 MED ORDER — ONDANSETRON HCL 4 MG/2ML IJ SOLN: 4.0000 mg | Freq: Once | INTRAMUSCULAR | Status: DC | PRN

## 2019-06-21 MED ORDER — SODIUM CHLORIDE 0.9 % IR SOLN
Status: DC | PRN
  Administered 2019-06-21: 1

## 2019-06-21 MED ORDER — POVIDONE-IODINE 10 % EX OINT
TOPICAL_OINTMENT | CUTANEOUS | Status: AC
  Filled 2019-06-21: qty 1

## 2019-06-21 MED ORDER — FENTANYL CITRATE (PF) 250 MCG/5ML IJ SOLN
INTRAMUSCULAR | Status: AC
  Filled 2019-06-21: qty 5

## 2019-06-21 MED ORDER — KETOROLAC TROMETHAMINE 30 MG/ML IJ SOLN
30.0000 mg | Freq: Once | INTRAMUSCULAR | Status: AC
  Administered 2019-06-21: 30 mg via INTRAVENOUS
  Filled 2019-06-21: qty 1

## 2019-06-21 MED ORDER — SUGAMMADEX SODIUM 500 MG/5ML IV SOLN
INTRAVENOUS | Status: DC | PRN
  Administered 2019-06-21: 200 mg via INTRAVENOUS

## 2019-06-21 MED ORDER — MEPERIDINE HCL 50 MG/ML IJ SOLN: 6.2500 mg | INTRAMUSCULAR | Status: DC | PRN

## 2019-06-21 MED ORDER — LACTATED RINGERS IV SOLN: Freq: Once | INTRAVENOUS | Status: AC

## 2019-06-21 MED ORDER — PROPOFOL 10 MG/ML IV BOLUS
INTRAVENOUS | Status: AC
  Filled 2019-06-21: qty 40

## 2019-06-21 MED ORDER — HYDROMORPHONE HCL 1 MG/ML IJ SOLN
0.2500 mg | INTRAMUSCULAR | Status: DC | PRN
  Administered 2019-06-21 (×4): 0.5 mg via INTRAVENOUS
  Filled 2019-06-21 (×3): qty 0.5

## 2019-06-21 MED ORDER — SUCCINYLCHOLINE CHLORIDE 200 MG/10ML IV SOSY
PREFILLED_SYRINGE | INTRAVENOUS | Status: DC | PRN
  Administered 2019-06-21: 120 mg via INTRAVENOUS

## 2019-06-21 MED ORDER — PROPOFOL 10 MG/ML IV BOLUS
INTRAVENOUS | Status: DC | PRN
  Administered 2019-06-21: 200 mg via INTRAVENOUS

## 2019-06-21 SURGICAL SUPPLY — 35 items
BANDAGE ACE 3X5.8 VEL STRL LF (GAUZE/BANDAGES/DRESSINGS) ×1 IMPLANT
BLADE OSC/SAGITTAL MD 9X18.5 (BLADE) IMPLANT
BLADE SAW GIGLI 510 (BLADE) IMPLANT
BNDG ELASTIC 4X5.8 VLCR NS LF (GAUZE/BANDAGES/DRESSINGS) ×1 IMPLANT
BNDG GAUZE ELAST 4 BULKY (GAUZE/BANDAGES/DRESSINGS) IMPLANT
CLOTH BEACON ORANGE TIMEOUT ST (SAFETY) ×2 IMPLANT
COVER LIGHT HANDLE STERIS (MISCELLANEOUS) ×4 IMPLANT
COVER WAND RF STERILE (DRAPES) ×2 IMPLANT
DRSG XEROFORM 1X8 (GAUZE/BANDAGES/DRESSINGS) ×2 IMPLANT
ELECT NDL TIP 2.8 STRL (NEEDLE) IMPLANT
ELECT NEEDLE TIP 2.8 STRL (NEEDLE) ×2 IMPLANT
ELECT REM PT RETURN 9FT ADLT (ELECTROSURGICAL) ×2
ELECTRODE REM PT RTRN 9FT ADLT (ELECTROSURGICAL) ×1 IMPLANT
GAUZE SPONGE 4X4 12PLY STRL (GAUZE/BANDAGES/DRESSINGS) ×2 IMPLANT
GAUZE SPONGE 4X4 12PLY STRL LF (GAUZE/BANDAGES/DRESSINGS) ×1 IMPLANT
GLOVE BIOGEL PI IND STRL 7.0 (GLOVE) ×3 IMPLANT
GLOVE BIOGEL PI INDICATOR 7.0 (GLOVE) ×3
GLOVE SURG SS PI 7.5 STRL IVOR (GLOVE) ×2 IMPLANT
GOWN STRL REUS W/TWL LRG LVL3 (GOWN DISPOSABLE) ×6 IMPLANT
INST SET MINOR BONE (KITS) ×2 IMPLANT
KIT TURNOVER KIT A (KITS) ×2 IMPLANT
MANIFOLD NEPTUNE II (INSTRUMENTS) ×2 IMPLANT
NS IRRIG 1000ML POUR BTL (IV SOLUTION) ×2 IMPLANT
PACK BASIC LIMB (CUSTOM PROCEDURE TRAY) ×2 IMPLANT
PAD ARMBOARD 7.5X6 YLW CONV (MISCELLANEOUS) ×2 IMPLANT
SET BASIN LINEN APH (SET/KITS/TRAYS/PACK) ×2 IMPLANT
SOL PREP PROV IODINE SCRUB 4OZ (MISCELLANEOUS) ×2 IMPLANT
SPONGE LAP 18X18 RF (DISPOSABLE) ×2 IMPLANT
SUT ETHILON 3 0 FSL (SUTURE) IMPLANT
SUT PROLENE 2 0 FS (SUTURE) IMPLANT
SUT PROLENE 3 0 PS 2 (SUTURE) ×3 IMPLANT
SUT VIC AB 3-0 SH 27 (SUTURE) ×2
SUT VIC AB 3-0 SH 27XBRD (SUTURE) ×1 IMPLANT
SWAB CULTURE ESWAB REG 1ML (MISCELLANEOUS) IMPLANT
SWAB CULTURE LIQ STUART DBL (MISCELLANEOUS) IMPLANT

## 2019-06-21 NOTE — Anesthesia Postprocedure Evaluation (Signed)
Anesthesia Post Note  Patient: [name]  Procedure(s) Performed: AMPUTATION TOE (Right Toe)  Patient location during evaluation: PACU Anesthesia Type: General Level of consciousness: patient cooperative, oriented and awake and alert Pain management: pain level controlled Vital Signs Assessment: post-procedure vital signs reviewed and stable Respiratory status: spontaneous breathing, respiratory function stable and nonlabored ventilation Cardiovascular status: stable Postop Assessment: no apparent nausea or vomiting Anesthetic complications: no     Last Vitals:  Vitals:   06/21/19 0753 06/21/19 0834  BP: (!) 156/92 (!) 147/95  Pulse: 92   Resp:    Temp: 36.6 C   SpO2: 99%     Last Pain:  Vitals:   06/21/19 0753  TempSrc: Oral  PainSc: 6                  Vinetta Brach

## 2019-06-21 NOTE — Anesthesia Preprocedure Evaluation (Signed)
Anesthesia Evaluation  Patient identified by MRN, date of birth, ID band Patient awake    Reviewed: Allergy & Precautions, NPO status , Patient's Chart, lab work & pertinent test results  History of Anesthesia Complications (+) history of anesthetic complications  Airway Mallampati: III  TM Distance: >3 FB Neck ROM: Full    Dental  (+) Chipped, Missing, Dental Advisory Given   Pulmonary former smoker,    Pulmonary exam normal breath sounds clear to auscultation       Cardiovascular Exercise Tolerance: Good hypertension, Pt. on medications Normal cardiovascular exam Rhythm:Regular Rate:Normal  16-Mar-2019 13:18:12 [facility]-ER ROUTINE RECORD Sinus tachycardia Left posterior fascicular block Confirmed by Lajean Saver [phone]) on 03/16/2019 1:21:45 PM   Neuro/Psych  Headaches,  Neuromuscular disease negative psych ROS   GI/Hepatic PUD, GERD  Medicated and Controlled,  Endo/Other  diabetes, Poorly Controlled, Type 2, Insulin Dependent  Renal/GU Renal disease (stones)  negative genitourinary   Musculoskeletal  (+) Arthritis , Osteoarthritis,    Abdominal   Peds  Hematology   Anesthesia Other Findings   Reproductive/Obstetrics                            Anesthesia Physical Anesthesia Plan  ASA: III  Anesthesia Plan: General   Post-op Pain Management:    Induction: Intravenous  PONV Risk Score and Plan: 3 and Ondansetron and Metaclopromide  Airway Management Planned: Oral ETT  Additional Equipment:   Intra-op Plan:   Post-operative Plan: Extubation in OR  Informed Consent: I have reviewed the patients History and Physical, chart, labs and discussed the procedure including the risks, benefits and alternatives for the proposed anesthesia with the patient or authorized representative who has indicated his/her understanding and acceptance.     Dental advisory  given  Plan Discussed with: CRNA and Surgeon  Anesthesia Plan Comments:        Anesthesia Quick Evaluation

## 2019-06-21 NOTE — Progress Notes (Signed)
PROGRESS NOTE    [name]  [id number] DOB: [birth year] DOA: 06/16/2019 PCP: [name], MD    Brief Narrative:  HPI:  [name]  is a 59 y.o. male, past medical history of hypertension, diabetes mellitus, insulin-dependent, GERD, arthritis, patient presents to ED secondary to fourth digit right lower extremity wound, reports wound has been progressive over last 2 weeks, did start after he was cutting the nail too close to the skin, then he had progressive erythema, swelling, and pain, and with some drainage to the toe and discoloration, he does report he has some diabetic neuropathy as well some decreased sensation, he denies any fever, chills, nausea, vomiting, cough, reports he was soaking his toe daily and Epson salt, with no much help.   Assessment & Plan:   Active Problems:   DM type 2 (diabetes mellitus, type 2) (HCC)   HTN (hypertension)   Osteomyelitis due to type 2 diabetes mellitus ([place])   Osteomyelitis of fourth toe of right foot ([place])   1. Osteomyelitis of fourth toe of right foot.  Currently on IV antibiotics.  Seen by general surgery.  S/p amputation of right 4th toe.  ABIs have been ordered.  Resting ABI on the right is reported within normal limits 2. DM 2, insulin dependent with hyperglycemia.  Blood sugars have improved.  Continue Lantus 35 units.  Continue NovoLog 8 units with meals.  Continue on sliding scale insulin. 3. Hypertension.  On diltiazem and losartan.  Blood pressure currently stable. 4. Diabetic neuropathy.  Continue gabapentin 5. GERD.  Continue PPI.   DVT prophylaxis: Lovenox Code Status: Full code Family Communication: Discussed with patient Disposition Plan: Status is: Inpatient  Remains inpatient appropriate because:Ongoing diagnostic testing needed not appropriate for outpatient work up . Anticipate discharge home tomorrow pending clearance by general surgery.   Dispo: The patient is from: Home              Anticipated  d/c is to: Home              Anticipated d/c date is: 1 day              Patient currently is not medically stable to d/c.    Consultants:   General surgery  Procedures:     Antimicrobials:   Ceftriaxone 5/27>  Flagyl 5/27>3/1   Subjective: Complaining of pain in his right foot. Recently received pain medications.  Objective: Vitals:   06/21/19 1030 06/21/19 1045 06/21/19 1144 06/21/19 1319  BP: 123/79 97/61 139/86 125/75  Pulse: 79 87 87 100  Resp: 12 11  20   Temp:    98.1 F (36.7 C)  TempSrc:    Oral  SpO2: 100% 95% 98% 97%  Weight:      Height:        Intake/Output Summary (Last 24 hours) at 06/21/2019 1825 Last data filed at 06/21/2019 1600 Gross per 24 hour  Intake 1180 ml  Output 305 ml  Net 875 ml   Filed Weights   06/16/19 1739  Weight: 90.7 kg    Examination:  General exam: Alert, awake, oriented x 3 Respiratory system: Clear to auscultation. Respiratory effort normal. Cardiovascular system:RRR. No murmurs, rubs, gallops. Gastrointestinal system: Abdomen is nondistended, soft and nontender. No organomegaly or masses felt. Normal bowel sounds heard. Central nervous system: Alert and oriented. No focal neurological deficits. Extremities: right foot is wrapped in dressing Skin: No rashes, lesions or ulcers Psychiatry: Judgement and insight appear normal. Mood & affect appropriate.  Data Reviewed: I have personally reviewed following labs and imaging studies  CBC: Recent Labs  Lab 06/16/19 2030 06/17/19 0513  WBC 9.2 7.1  NEUTROABS 5.7  --   HGB 14.4 13.3  HCT 41.6 38.1*  MCV 90.2 90.5  PLT 227 [id number]   Basic Metabolic Panel: Recent Labs  Lab 06/16/19 2030 06/17/19 0513 06/20/19 0553  NA 136 136 138  K 3.8 3.4* 3.6  CL 103 103 103  CO2 22 23 27   GLUCOSE 301* 274* 226*  BUN 20 20 19   CREATININE 1.19 0.88 0.80  CALCIUM 9.3 8.7* 9.0   GFR: Estimated Creatinine Clearance: 113.4 mL/min (by C-G formula based on SCr of 0.8  mg/dL). Liver Function Tests: Recent Labs  Lab 06/16/19 2030  AST 20  ALT 26  ALKPHOS 93  BILITOT 0.5  PROT 7.5  ALBUMIN 4.1   No results for input(s): LIPASE, AMYLASE in the last 168 hours. No results for input(s): AMMONIA in the last 168 hours. Coagulation Profile: No results for input(s): INR, PROTIME in the last 168 hours. Cardiac Enzymes: No results for input(s): CKTOTAL, CKMB, CKMBINDEX, TROPONINI in the last 168 hours. BNP (last 3 results) No results for input(s): PROBNP in the last 8760 hours. HbA1C: No results for input(s): HGBA1C in the last 72 hours. CBG: Recent Labs  Lab 06/20/19 1625 06/20/19 2024 06/21/19 0743 06/21/19 1117 06/21/19 1640  GLUCAP 165* 159* 232* 183* 310*   Lipid Profile: No results for input(s): CHOL, HDL, LDLCALC, TRIG, CHOLHDL, LDLDIRECT in the last 72 hours. Thyroid Function Tests: No results for input(s): TSH, T4TOTAL, FREET4, T3FREE, THYROIDAB in the last 72 hours. Anemia Panel: No results for input(s): VITAMINB12, FOLATE, FERRITIN, TIBC, IRON, RETICCTPCT in the last 72 hours. Sepsis Labs: Recent Labs  Lab 06/16/19 2030  LATICACIDVEN 1.4    Recent Results (from the past 240 hour(s))  Blood Cultures x 2 sites     Status: None   Collection Time: 06/16/19  8:24 PM   Specimen: BLOOD LEFT HAND  Result Value Ref Range Status   Specimen Description BLOOD LEFT HAND  Final   Special Requests   Final    BOTTLES DRAWN AEROBIC AND ANAEROBIC Blood Culture results may not be optimal due to an inadequate volume of blood received in culture bottles   Culture   Final    NO GROWTH 5 DAYS Performed at [facility], [address] [place], [place] [address]    Report Status 06/21/2019 FINAL  Final  Blood Cultures x 2 sites     Status: None   Collection Time: 06/16/19  8:30 PM   Specimen: BLOOD LEFT ARM  Result Value Ref Range Status   Specimen Description BLOOD LEFT ARM  Final   Special Requests   Final    BOTTLES DRAWN AEROBIC AND  ANAEROBIC Blood Culture results may not be optimal due to an inadequate volume of blood received in culture bottles   Culture   Final    NO GROWTH 5 DAYS Performed at [facility], [address] [place],  [address]    Report Status 06/21/2019 FINAL  Final  SARS Coronavirus 2 by RT PCR (hospital order, performed in [facility] hospital lab) Nasopharyngeal Nasopharyngeal Swab     Status: None   Collection Time: 06/16/19  9:36 PM   Specimen: Nasopharyngeal Swab  Result Value Ref Range Status   SARS Coronavirus 2 NEGATIVE NEGATIVE Final    Comment: (NOTE) SARS-CoV-2 target nucleic acids are NOT DETECTED. The SARS-CoV-2 RNA  is generally detectable in upper and lower respiratory specimens during the acute phase of infection. The lowest concentration of SARS-CoV-2 viral copies this assay can detect is 250 copies / mL. A negative result does not preclude SARS-CoV-2 infection and should not be used as the sole basis for treatment or other patient management decisions.  A negative result may occur with improper specimen collection / handling, submission of specimen other than nasopharyngeal swab, presence of viral mutation(s) within the areas targeted by this assay, and inadequate number of viral copies (<250 copies / mL). A negative result must be combined with clinical observations, patient history, and epidemiological information. Fact Sheet for Patients:   StrictlyIdeas.no Fact Sheet for Healthcare Providers: BankingDealers.co.za This test is not yet approved or cleared  by the [place] FDA and has been authorized for detection and/or diagnosis of SARS-CoV-2 by FDA under an Emergency Use Authorization (EUA).  This EUA will remain in effect (meaning this test can be used) for the duration of the COVID-19 declaration under Section 564(b)(1) of the Act, 21 U.S.C. section 360bbb-3(b)(1), unless the authorization is terminated  or revoked sooner. Performed at [facility], [address] [place], [place] [address]   Surgical pcr screen     Status: None   Collection Time: 06/21/19  3:07 AM   Specimen: Nasal Mucosa; Nasal Swab  Result Value Ref Range Status   MRSA, PCR NEGATIVE NEGATIVE Final   Staphylococcus aureus NEGATIVE NEGATIVE Final    Comment: (NOTE) The Xpert SA Assay (FDA approved for NASAL specimens in patients 79 years of age and older), is one component of a comprehensive surveillance program. It is not intended to diagnose infection nor to guide or monitor treatment. Performed at [facility], [address], [place], [place] [address]          Radiology Studies: No results found.      Scheduled Meds: . aspirin EC  81 mg Oral Daily  . buPROPion  100 mg Oral BID  . chlorthalidone  12.5 mg Oral Daily  . diltiazem (CARDIZEM CD) 24 hr capsule 420 mg  420 mg Oral Daily  . gabapentin  400 mg Oral TID  . insulin aspart  0-15 Units Subcutaneous TID WC  . insulin aspart  0-5 Units Subcutaneous QHS  . insulin aspart  8 Units Subcutaneous TID WC  . insulin glargine  35 Units Subcutaneous QHS  . losartan  100 mg Oral Daily  . multivitamin with minerals  1 tablet Oral Daily  . nutrition supplement (JUVEN)  1 packet Oral BID BM  . pantoprazole  40 mg Oral Daily  . Ensure Max Protein  11 oz Oral BID   Continuous Infusions: . cefTRIAXone (ROCEPHIN)  IV 2 g (06/20/19 2127)     LOS: 5 days    Time spent: 30 mins    Kathie Dike, MD [facility]   If 7PM-7AM, please contact night-coverage [email or web address]  06/21/2019, 6:25 PM

## 2019-06-21 NOTE — Op Note (Signed)
Patient:  [name]  DOB:  19-Feb-1960  MRN:  [id number]   Preop Diagnosis: Osteomyelitis of right fourth toe  Postop Diagnosis: Same  Procedure: Amputation of right fourth toe, metatarsal interphalangeal joint  Surgeon: Aviva Signs, MD  Anes: General  Indications: Patient is a 59 year old male with diabetes mellitus who presents with osteomyelitis of the right fourth toe.  The risks and benefits of the procedure including bleeding, infection, and the possibility of wound breakdown were fully explained to the patient, who gave informed consent.  Procedure note: The patient was placed in supine position.  After general anesthesia was administered, the right foot was prepped and draped using the usual sterile technique with Betadine.  Surgical site confirmation was performed.  An ovoid incision was made at the base of the right fourth toe.  Any bleeding was controlled using Bovie electrocautery.  The toe was then amputated at the interphalangeal joint and metatarsal head.  The toe was then sent to pathology further examination.  No purulent drainage was present.  The wound was irrigated with normal saline.  The skin was closed using a 4-0 Prolene interrupted suture.  Betadine ointment, Xeroform, and dry sterile dressing were applied.  All tape and needle counts were correct at the end of the procedure.  The patient was awakened and transferred to PACU in stable condition.  Complications: None  EBL: Minimal  Specimen: Right fourth toe

## 2019-06-21 NOTE — Evaluation (Signed)
Physical Therapy Evaluation Patient Details Name: [name] MRN: [id number] DOB: 07/27/1960 Today's Date: 06/21/2019   History of Present Illness  59 y.o. male, past medical history of hypertension, diabetes mellitus, insulin-dependent, GERD, arthritis, patient presents to ED secondary to fourth digit right lower extremity wound, reports wound has been progressive over last 2 weeks. Pt with osteomyelitis of the right fourth toe, s/p amputation on 06/21/19.  Clinical Impression  Pt admitted with above diagnosis. Pt unrealistic about current functional status, unsteady with RW requiring min guard assist and cues for maneuvering RW appropriately, but requesting crutches so he is able to ambualte in yard to care for chickens once home. Pt limited with ambulation 2* to chronic low back pain radiating down LLE. Educated pt on RLE NWB status and able to maintain with limited gait distance. Pt has daughter and her boyfriend that are able to assist him while recovering. DME TBD pending improved balance with AD with ambulation. Pt currently with functional limitations due to the deficits listed below (see PT Problem List). Pt will benefit from skilled PT to increase their independence and safety with mobility to allow discharge to the venue listed below.       Follow Up Recommendations Home health PT;Supervision - Intermittent    Equipment Recommendations  Other (comment)(RW vs. crutches pending progress)    Recommendations for Other Services       Precautions / Restrictions Precautions Precautions: Fall Restrictions Weight Bearing Restrictions: Yes RLE Weight Bearing: Non weight bearing      Mobility  Bed Mobility Overal bed mobility: Modified Independent  General bed mobility comments: increased time, use of  bedrail to come to sitting at EOB  Transfers Overall transfer level: Needs assistance Equipment used: Rolling walker (2 wheeled) Transfers: Sit to/from Stand Sit to Stand: Min  guard  General transfer comment: verbal cues on hand placement to aide in rising from seated surfaces, heavy BUE assist to maintain RLE NWB  Ambulation/Gait Ambulation/Gait assistance: Min guard Gait Distance (Feet): 16 Feet Assistive device: Rolling walker (2 wheeled)  Gait velocity: decreased  General Gait Details: hop to pattern using RW with cues for safe speed and maneuvering RW appropriately, increased back pain radiating down LLE limiting ambulation tolerance  Stairs            Wheelchair Mobility    Modified Rankin (Stroke Patients Only)       Balance Overall balance assessment: Needs assistance Sitting-balance support: Feet supported;No upper extremity supported Sitting balance-Leahy Scale: Good Sitting balance - Comments: seated EOB   Standing balance support: During functional activity;Bilateral upper extremity supported Standing balance-Leahy Scale: Fair Standing balance comment: fair static/poor dynamic with RW                Pertinent Vitals/Pain Pain Assessment: 0-10 Pain Score: 8  Pain Location: R foot/amputation site Pain Descriptors / Indicators: Aching;Discomfort Pain Intervention(s): Limited activity within patient's tolerance;Monitored during session;Premedicated before session    Home Living Family/patient expects to be discharged to:: Private residence Living Arrangements: Alone(friend occasionally stays with pt) Available Help at Discharge: Friend(s);Available PRN/intermittently Type of Home: House Home Access: Level entry     Home Layout: One level Home Equipment: Cane - single point;Other (comment)(walking stick)      Prior Function Level of Independence: Independent with assistive device(s)         Comments: Pt reports being independent with bathing and dressing, uses walking stick in the yard to alleviate back pain, drives, has a chicken coop, hasn't worked  in over a year. Pt reports his daughter and her boyfriend complete  cooking, cleaning, yard work and [employer]. Pt reports had back surgery and has multiple pinched nerves which limit his mobility.     Hand Dominance        Extremity/Trunk Assessment   Upper Extremity Assessment Upper Extremity Assessment: Overall WFL for tasks assessed    Lower Extremity Assessment Lower Extremity Assessment: RLE deficits/detail RLE Deficits / Details: 4th toe amputation; ankle, hip and knee AROM WNL, strength 4/5, dressing donned on R foot with dry blood on plantar surface    Cervical / Trunk Assessment Cervical / Trunk Assessment: Normal  Communication   Communication: No difficulties  Cognition Arousal/Alertness: Awake/alert Behavior During Therapy: WFL for tasks assessed/performed Overall Cognitive Status: Within Functional Limits for tasks assessed           General Comments General comments (skin integrity, edema, etc.): denies dizziness and lightheadedness with mobility, no increased work of breathing; limited by chronic low back pain radiating down LLE    Exercises     Assessment/Plan    PT Assessment Patient needs continued PT services  PT Problem List Decreased activity tolerance;Decreased balance;Decreased knowledge of use of DME;Decreased safety awareness;Impaired sensation;Pain       PT Treatment Interventions DME instruction;Gait training;Functional mobility training;Therapeutic activities;Therapeutic exercise;Balance training;Neuromuscular re-education;Patient/family education    PT Goals (Current goals can be found in the Care Plan section)  Acute Rehab PT Goals Patient Stated Goal: get crutches and feed chickens PT Goal Formulation: With patient Time For Goal Achievement: 06/28/19 Potential to Achieve Goals: Good    Frequency Min 5X/week   Barriers to discharge        Co-evaluation               AM-PAC PT "6 Clicks" Mobility  Outcome Measure Help needed turning from your back to your side while in a flat bed  without using bedrails?: None Help needed moving from lying on your back to sitting on the side of a flat bed without using bedrails?: None Help needed moving to and from a bed to a chair (including a wheelchair)?: A Little Help needed standing up from a chair using your arms (e.g., wheelchair or bedside chair)?: A Little Help needed to walk in hospital room?: A Little Help needed climbing 3-5 steps with a railing? : Total 6 Click Score: 18    End of Session   Activity Tolerance: Patient limited by pain Patient left: in bed;with call bell/phone within reach Nurse Communication: Mobility status;Weight bearing status PT Visit Diagnosis: Unsteadiness on feet (R26.81);Other abnormalities of gait and mobility (R26.89);Pain Pain - Right/Left: Right Pain - part of body: Ankle and joints of foot    Time: 1345-1409 PT Time Calculation (min) (ACUTE ONLY): 24 min   Charges:   PT Evaluation $PT Eval Moderate Complexity: 1 Mod          Tori Willow Shidler PT, DPT 06/21/19, 2:42 PM [phone]

## 2019-06-21 NOTE — Transfer of Care (Signed)
Immediate Anesthesia Transfer of Care Note  Patient: [name]  Procedure(s) Performed: AMPUTATION TOE (Right Toe)  Patient Location: PACU  Anesthesia Type:General  Level of Consciousness: awake, alert , oriented and patient cooperative  Airway & Oxygen Therapy: Patient Spontanous Breathing and Patient connected to face mask oxygen  Post-op Assessment: Report given to RN, Post -op Vital signs reviewed and stable and Patient moving all extremities X 4  Post vital signs: Reviewed and stable  Last Vitals:  Vitals Value Taken Time  BP    Temp    Pulse 88 06/21/19 0952  Resp    SpO2 98 % 06/21/19 0952  Vitals shown include unvalidated device data.  Last Pain:  Vitals:   06/21/19 0753  TempSrc: Oral  PainSc: 6       Patients Stated Pain Goal: 10 ([phone])  Complications: No apparent anesthesia complications

## 2019-06-21 NOTE — Interval H&P Note (Signed)
History and Physical Interval Note:  06/21/2019 8:09 AM  [name]  has presented today for surgery, with the diagnosis of right fourth toe osteomyelitis.  The various methods of treatment have been discussed with the patient and family. After consideration of risks, benefits and other options for treatment, the patient has consented to  Procedure(s): AMPUTATION TOE (Right) as a surgical intervention.  The patient's history has been reviewed, patient examined, no change in status, stable for surgery.  I have reviewed the patient's chart and labs.  Questions were answered to the patient's satisfaction.     Aviva Signs

## 2019-06-21 NOTE — Plan of Care (Signed)
  Problem: Acute Rehab PT Goals(only PT should resolve) Goal: Patient Will Transfer Sit To/From Stand Outcome: Progressing Flowsheets (Taken 06/21/2019 1448) Patient will transfer sit to/from stand: with modified independence Goal: Pt Will Transfer Bed To Chair/Chair To Bed Outcome: Progressing Flowsheets (Taken 06/21/2019 1448) Pt will Transfer Bed to Chair/Chair to Bed: with modified independence Goal: Pt Will Ambulate Outcome: Progressing Flowsheets (Taken 06/21/2019 1448) Pt will Ambulate:  > 125 feet  with modified independence  with least restrictive assistive device   Tori Ivyana Locey PT, DPT 06/21/19, 2:48 PM [phone]

## 2019-06-21 NOTE — Anesthesia Procedure Notes (Signed)
Procedure Name: Intubation Date/Time: 06/21/2019 9:07 AM Performed by: Jonna Munro, CRNA Pre-anesthesia Checklist: Patient identified, Emergency Drugs available, Suction available, Patient being monitored and Timeout performed Patient Re-evaluated:Patient Re-evaluated prior to induction Oxygen Delivery Method: Circle system utilized Preoxygenation: Pre-oxygenation with 100% oxygen Induction Type: IV induction, Rapid sequence and Cricoid Pressure applied Laryngoscope Size: Mac and 4 Grade View: Grade I Tube type: Oral Tube size: 7.5 mm Number of attempts: 1 Airway Equipment and Method: Stylet Placement Confirmation: ETT inserted through vocal cords under direct vision,  positive ETCO2 and breath sounds checked- equal and bilateral Secured at: 23 cm Tube secured with: Tape Dental Injury: Teeth and Oropharynx as per pre-operative assessment

## 2019-06-22 LAB — GLUCOSE, CAPILLARY
Glucose-Capillary: 181 mg/dL — ABNORMAL HIGH (ref 70–99)
Glucose-Capillary: 278 mg/dL — ABNORMAL HIGH (ref 70–99)

## 2019-06-22 MED ORDER — ASPIRIN EC 81 MG PO TBEC: 81.0000 mg | DELAYED_RELEASE_TABLET | Freq: Every day | ORAL | 3 refills | Status: AC

## 2019-06-22 MED ORDER — INSULIN ASPART 100 UNIT/ML FLEXPEN: 0.0000 [IU] | PEN_INJECTOR | Freq: Three times a day (TID) | SUBCUTANEOUS | 11 refills | Status: DC

## 2019-06-22 MED ORDER — ACETAMINOPHEN 325 MG PO TABS
650.0000 mg | ORAL_TABLET | ORAL | Status: DC | PRN
  Administered 2019-06-22: 650 mg via ORAL
  Filled 2019-06-22: qty 2

## 2019-06-22 MED ORDER — HYDROMORPHONE HCL 1 MG/ML IJ SOLN
0.5000 mg | INTRAMUSCULAR | Status: DC | PRN
  Administered 2019-06-22 (×3): 0.5 mg via INTRAVENOUS
  Filled 2019-06-22 (×3): qty 0.5

## 2019-06-22 MED ORDER — HYDROMORPHONE HCL 1 MG/ML IJ SOLN
1.0000 mg | Freq: Once | INTRAMUSCULAR | Status: AC
  Administered 2019-06-22: 1 mg via INTRAVENOUS
  Filled 2019-06-22: qty 1

## 2019-06-22 MED ORDER — LANTUS 100 UNIT/ML ~~LOC~~ SOLN: 35.0000 [IU] | Freq: Every day | SUBCUTANEOUS | 11 refills | Status: DC

## 2019-06-22 MED ORDER — ACETAMINOPHEN 325 MG PO TABS: 650.0000 mg | ORAL_TABLET | ORAL | 0 refills | Status: DC | PRN

## 2019-06-22 MED ORDER — POLYETHYLENE GLYCOL 3350 17 G PO PACK: 17.0000 g | PACK | Freq: Every day | ORAL | 0 refills | Status: DC | PRN

## 2019-06-22 NOTE — Discharge Instructions (Signed)
Take dressing off this Saturday 06/25/19, then wash with soap and water daily.  Keep right foot elevated at night.  No weight bearing on toes of right foot.  ---1)Sliding scale Insulin aspart (novoLOG) injection 0-10 Units 0-10 Units Subcutaneous, 3 times daily with meals CBG < 70: Implement Hypoglycemia Standing Orders and refer to Hypoglycemia Standing Orders sidebar report  CBG 70 - 120: 0 unit CBG 121 - 150: 0 unit  CBG 151 - 200: 1 unit CBG 201 - 250: 2 units CBG 251 - 300: 4 units CBG 301 - 350: 6 units  CBG 351 - 400: 8 units  CBG > 400: 10 units  2)Wound care instructions and follow- up as per Dr. Arnoldo Morale (General surgeon)   3)Increase Lantus insulin to 35 units at bedtime (from 25 units)

## 2019-06-22 NOTE — TOC Transition Note (Signed)
Transition of Care [facility]) - CM/SW Discharge Note   Patient Details  Name: [name] MRN: [id number] Date of Birth: July 31, 1960  [facility]) CM/SW Contact:  Boneta Lucks, RN Phone Number: 06/22/2019, 2:34 PM   Clinical Narrative:   Patient discharge home today. PT recommending PT and rolling walker. TOC delivered rolling walker to the room [name] accepted the referral.  TOC spoke with Patient is already had an outpatient PT appointment for his back. TOC placed new orders, patient agrees to go to [facility] out patient PT.    Final next level of care: Home/Self Care Barriers to Discharge: Barriers Resolved   Patient Goals and CMS Choice Patient states their goals for this hospitalization and ongoing recovery are:: to go home. CMS Medicare.gov Compare Post Acute Care list provided to:: Patient Choice offered to / list presented to : Patient  Discharge Placement             DME Arranged: 3-N-1, Gilford Rile DME Agency: [employer] Date DME Agency Contacted: 06/22/19 Time DME Agency Contacted: [id number] Representative spoke with at DME Agency: [place]: NA [place] Agency: NA

## 2019-06-22 NOTE — Progress Notes (Signed)
Inpatient Diabetes Program Recommendations  AACE/ADA: New Consensus Statement on Inpatient Glycemic Control   Target Ranges:  Prepandial:   less than 140 mg/dL      Peak postprandial:   less than 180 mg/dL (1-2 hours)      Critically ill patients:  140 - 180 mg/dL   Results for [name], [name] (MRN [id number]) as of 06/22/2019 09:25  Ref. Range 06/21/2019 07:43 06/21/2019 11:17 06/21/2019 16:40 06/21/2019 20:33 06/22/2019 08:14  Glucose-Capillary Latest Ref Range: 70 - 99 mg/dL 232 (H) 183 (H) 310 (H) 388 (H) 181 (H)   Review of Glycemic Control  Diabetes history: DM2 Outpatient Diabetes medications: Lantus 25 units QHS, Januvia 10 mg daily, Metformin 2000 mg QAM Current orders for Inpatient glycemic control: Lantus 35 units QHS, Novolog 8 units TID with meals, Novolog 0-15 units TID with meals, Novolog 0-5 units QHS  Inpatient Diabetes Program Recommendations:   Insulin - Meal Coverage: Please consider increasing meal coverage to Novolog 14 units TID with meals if patient eats at least 50% of meals.  Thanks, Barnie Alderman, RN, MSN, CDE Diabetes Coordinator Inpatient Diabetes Program [phone] (Team Pager from 8am to 5pm)

## 2019-06-22 NOTE — Discharge Summary (Signed)
[name], is a 59 y.o. male  DOB 28-Mar-1960  MRN [id number].  Admission date:  06/16/2019  Admitting Physician  Jerry Patricia, MD  Discharge Date:  06/22/2019   Primary MD  Jerry School, MD  Recommendations for primary care physician for things to follow:    Take dressing off this Saturday 06/25/19, then wash with soap and water daily.  Keep right foot elevated at night.  No weight bearing on toes of right foot.  ---1)Sliding scale Insulin aspart (novoLOG) injection 0-10 Units 0-10 Units Subcutaneous, 3 times daily with meals CBG < 70: Implement Hypoglycemia Standing Orders and refer to Hypoglycemia Standing Orders sidebar report  CBG 70 - 120: 0 unit CBG 121 - 150: 0 unit  CBG 151 - 200: 1 unit CBG 201 - 250: 2 units CBG 251 - 300: 4 units CBG 301 - 350: 6 units  CBG 351 - 400: 8 units  CBG > 400: 10 units  2)Wound care instructions and follow- up as per [name] (General surgeon)   3)Increase Lantus insulin to 35 units at bedtime (from 25 units) Admission Diagnosis  Osteomyelitis ([place]) [M86.9] Pain [R52] Hyperglycemia [R73.9] Osteomyelitis of fourth toe of right foot ([place]) [M86.9]   Discharge Diagnosis  Osteomyelitis ([place]) [M86.9] Pain [R52] Hyperglycemia [R73.9] Osteomyelitis of fourth toe of right foot ([place]) [M86.9]    Active Problems:   DM type 2 (diabetes mellitus, type 2) (HCC)   HTN (hypertension)   Osteomyelitis due to type 2 diabetes mellitus ([place])   Osteomyelitis of fourth toe of right foot ([place])      Past Medical History:  Diagnosis Date  . Arthritis   . Diabetes mellitus   . GERD (gastroesophageal reflux disease)   . Headache(784.0)   . Hypertension   . Kidney stones     Past Surgical History:  Procedure Laterality Date  . AMPUTATION TOE Right 06/21/2019   Procedure: AMPUTATION TOE;  Surgeon: Jerry Signs, MD;  Location: [facility] ORS;  Service: General;   Laterality: Right;  . CARDIAC CATHETERIZATION  2009  . CHOLECYSTECTOMY N/A 10/24/2013   Procedure: LAPAROSCOPIC CHOLECYSTECTOMY;  Surgeon: Jerry So, MD;  Location: [facility] ORS;  Service: General;  Laterality: N/A;  . COLONOSCOPY N/A 03/31/2013   Procedure: COLONOSCOPY;  Surgeon: Jerry Houston, MD;  Location: [facility] ENDO SUITE;  Service: Endoscopy;  Laterality: N/A;  730     HPI  from the history and physical done on the day of admission:      [name]  is a 59 y.o. male, past medical history of hypertension, diabetes mellitus, insulin-dependent, GERD, arthritis, patient presents to ED secondary to fourth digit right lower extremity wound, reports wound has been progressive over last 2 weeks, did start after he was cutting the nail too close to the skin, then he had progressive erythema, swelling, and pain, and with some drainage to the toe and discoloration, he does report he has some diabetic neuropathy as well some decreased sensation, he denies any fever, chills, nausea, vomiting,  cough, reports he was soaking his toe daily and Epson salt, with no much help. ED course -She is afebrile, no leukocytosis, no significant labs abnormalities beside his glucose 300, reported usually runs above 200 at home), x-ray was significant for osteomyelitis in the fourth digit, he was started on IV Rocephin and Flagyl, and [facility] hospitalist were consulted to admit     Hospital Course:    Brief Narrative:  HPI: JosephHernandezis a58 y.o.male,past medical history of hypertension, diabetes mellitus, insulin-dependent, GERD, arthritis, patient presents to ED secondary to fourth digit right lower extremity wound, reports wound has been progressive over last 2 weeks, did start after he was cutting the nail too close to the skin, then he had progressive erythema, swelling, and pain, and with some drainage to the toe and discoloration, he does report he has some diabetic neuropathy as well some decreased  sensation, he denies any fever, chills, nausea, vomiting, cough, reports he was soaking his toe daily and Epson salt, with no much help.   Assessment & Plan:   Active Problems:   DM type 2 (diabetes mellitus, type 2) (HCC)   HTN (hypertension)   Osteomyelitis due to type 2 diabetes mellitus ([place])   Osteomyelitis of fourth toe of right foot ([place])   1. Osteomyelitis of fourth toe of right foot.  Treated with IV antibiotics, Seen by general surgery.  S/p amputation of right 4th toe.    Resting ABI on the right is reported within normal limits -per general surgeon no further antibiotics needed post amputation 2. DM 2, insulin dependent with hyperglycemia.  Blood sugars have improved.    Discharged on insulin regimen , along with Januvia and Metformin 3. hypertension.    Discharge on Cardizem and losartan 4. diabetic neuropathy--.  Continue gabapentin 5. GERD.  Continue Protonix  Code Status: Full code Family Communication: Discussed with patient Disposition  : The patient is from: Home  Anticipated d/c is to: Home   Consultants:   General surgery  Procedures:     Antimicrobials:   Ceftriaxone 5/27>  Flagyl 5/27>3/1  Discharge Condition: Stable  Follow UP  Follow-up Information    Jerry Signs, MD. Schedule an appointment as soon as possible for a visit on 07/05/2019.   Specialty: General Surgery Contact information: [address] [place] [phone]            Consults obtained - gen surg  Diet and Activity recommendation:  As advised  Discharge Instructions    Discharge Instructions    Call MD for:  difficulty breathing, headache or visual disturbances   Complete by: As directed    Call MD for:  persistant dizziness or light-headedness   Complete by: As directed    Call MD for:  persistant nausea and vomiting   Complete by: As directed    Call MD for:  severe uncontrolled pain   Complete by: As directed      Call MD for:  temperature >100.4   Complete by: As directed    Diet - low sodium heart healthy   Complete by: As directed    Discharge instructions   Complete by: As directed    Take dressing off this Saturday 06/25/19, then wash with soap and water daily.  Keep right foot elevated at night.  No weight bearing on toes of right foot.  ---1)Sliding scale Insulin aspart (novoLOG) injection 0-10 Units 0-10 Units Subcutaneous, 3 times daily with meals CBG < 70: Implement Hypoglycemia Standing Orders and refer to  Hypoglycemia Standing Orders sidebar report  CBG 70 - 120: 0 unit CBG 121 - 150: 0 unit  CBG 151 - 200: 1 unit CBG 201 - 250: 2 units CBG 251 - 300: 4 units CBG 301 - 350: 6 units  CBG 351 - 400: 8 units  CBG > 400: 10 units  2)Wound care instructions and follow- up as per [name] (General surgeon)   3)Increase Lantus insulin to 35 units at bedtime (from 25 units)   Increase activity slowly   Complete by: As directed         Discharge Medications     Allergies as of 06/22/2019   No Known Allergies     Medication List    STOP taking these medications   amLODipine 5 MG tablet Commonly known as: NORVASC   naproxen 500 MG tablet Commonly known as: NAPROSYN     TAKE these medications   acetaminophen 325 MG tablet Commonly known as: TYLENOL Take 2 tablets (650 mg total) by mouth every 4 (four) hours as needed for mild pain or headache.   ALPRAZolam 0.5 MG tablet Commonly known as: XANAX Take 0.5 mg by mouth 3 (three) times daily as needed for anxiety or sleep.   aspirin EC 81 MG tablet Take 1 tablet (81 mg total) by mouth daily with breakfast. What changed: when to take this   atorvastatin 20 MG tablet Commonly known as: LIPITOR Take 20 mg by mouth daily.   buPROPion 100 MG tablet Commonly known as: WELLBUTRIN Take 100 mg by mouth 2 (two) times daily.   diltiazem 420 MG 24 hr capsule Commonly known as: TIAZAC Take 420 mg by mouth daily.   diphenhydrAMINE  25 MG tablet Commonly known as: BENADRYL Take 25 mg by mouth at bedtime as needed for allergies.   gabapentin 400 MG capsule Commonly known as: NEURONTIN Take 400 mg by mouth 3 (three) times daily.   HYDROcodone-acetaminophen 10-325 MG tablet Commonly known as: NORCO Take 1 tablet by mouth every 4 (four) hours as needed for moderate pain.   insulin aspart 100 UNIT/ML FlexPen Commonly known as: NOVOLOG Inject 0-10 Units into the skin 3 (three) times daily with meals. insulin aspart (novoLOG) injection 0-10 Units 0-10 Units Subcutaneous, 3 times daily with meals CBG < 70: Implement Hypoglycemia Standing Orders and refer to Hypoglycemia Standing Orders sidebar report  CBG 70 - 120: 0 unit CBG 121 - 150: 0 unit  CBG 151 - 200: 1 unit CBG 201 - 250: 2 units CBG 251 - 300: 4 units CBG 301 - 350: 6 units  CBG 351 - 400: 8 units  CBG > 400: 10 units   Januvia 100 MG tablet Generic drug: sitaGLIPtin Take 100 mg by mouth daily.   Lantus 100 UNIT/ML injection Generic drug: insulin glargine Inject 0.35 mLs (35 Units total) into the skin at bedtime. What changed: how much to take   losartan 100 MG tablet Commonly known as: COZAAR Take 100 mg by mouth daily.   metFORMIN 500 MG tablet Commonly known as: GLUCOPHAGE Take 2,000 mg by mouth every morning.   multivitamin with minerals Tabs tablet Take 1 tablet by mouth daily.   pantoprazole 40 MG tablet Commonly known as: PROTONIX Take 1 tablet (40 mg total) by mouth daily.   polyethylene glycol 17 g packet Commonly known as: MIRALAX / GLYCOLAX Take 17 g by mouth daily as needed for mild constipation.   tiZANidine 4 MG tablet Commonly known as: ZANAFLEX Take 4 mg by  mouth 4 (four) times daily as needed for muscle spasms.   triamcinolone cream 0.1 % Commonly known as: KENALOG Apply 1 application topically 2 (two) times daily as needed.       Major procedures and Radiology Reports - PLEASE review detailed and final reports for all  details, in brief -   US ARTERIAL ABI (SCREENING LOWER EXTREMITY)  Result Date: 06/17/2019 CLINICAL DATA:  Right fourth toe ulcer. History of hypertension and diabetes. EXAM: NONINVASIVE PHYSIOLOGIC VASCULAR STUDY OF BILATERAL LOWER EXTREMITIES TECHNIQUE: Evaluation of both lower extremities were performed at rest, including calculation of ankle-brachial indices with single level Doppler, pressure and pulse volume recording. COMPARISON:  None. FINDINGS: Right ABI: 1.36 at the level of the dorsalis pedis artery. The posterior tibial artery is noncompressible. Left ABI: Both posterior tibial and dorsalis pedis arteries are noncompressible. Right Lower Extremity: Monophasic posterior tibial and biphasic dorsalis pedis waveforms Left Lower Extremity: Biphasic posterior tibial and dorsalis pedis waveforms 1.0-1.4 Normal IMPRESSION: The resting right ankle-brachial index is within normal limits at the level of the dorsalis pedis artery. Noninvasive evaluation is very limited bilaterally due to noncompressibility of the right posterior tibial artery and left posterior tibial and dorsalis pedis arteries. Electronically Signed   By: Aletta Edouard M.D.   On: 06/17/2019 11:18   DG Foot Complete Right  Result Date: 06/16/2019 CLINICAL DATA:  Diabetic ulcer fourth digit EXAM: RIGHT FOOT COMPLETE - 3+ VIEW COMPARISON:  None. FINDINGS: Frontal, oblique, and lateral views of the right foot are obtained. There are destructive changes of the fourth distal phalanx with overlying soft tissue swelling and subcutaneous gas, consistent with osteomyelitis. No other acute or destructive bony abnormalities. Diffuse vascular calcifications are noted. IMPRESSION: 1. Osteomyelitis fourth distal phalanx. Electronically Signed   By: Randa Ngo M.D.   On: 06/16/2019 18:42    Micro Results   Recent Results (from the past 240 hour(s))  Blood Cultures x 2 sites     Status: None   Collection Time: 06/16/19  8:24 PM   Specimen:  BLOOD LEFT HAND  Result Value Ref Range Status   Specimen Description BLOOD LEFT HAND  Final   Special Requests   Final    BOTTLES DRAWN AEROBIC AND ANAEROBIC Blood Culture results may not be optimal due to an inadequate volume of blood received in culture bottles   Culture   Final    NO GROWTH 5 DAYS Performed at [facility], [address], [place], [place] [address]    Report Status 06/21/2019 FINAL  Final  Blood Cultures x 2 sites     Status: None   Collection Time: 06/16/19  8:30 PM   Specimen: BLOOD LEFT ARM  Result Value Ref Range Status   Specimen Description BLOOD LEFT ARM  Final   Special Requests   Final    BOTTLES DRAWN AEROBIC AND ANAEROBIC Blood Culture results may not be optimal due to an inadequate volume of blood received in culture bottles   Culture   Final    NO GROWTH 5 DAYS Performed at [facility], [address], [place], [place] [address]    Report Status 06/21/2019 FINAL  Final  SARS Coronavirus 2 by RT PCR (hospital order, performed in [facility] hospital lab) Nasopharyngeal Nasopharyngeal Swab     Status: None   Collection Time: 06/16/19  9:36 PM   Specimen: Nasopharyngeal Swab  Result Value Ref Range Status   SARS Coronavirus 2 NEGATIVE NEGATIVE Final    Comment: (NOTE) SARS-CoV-2 target  nucleic acids are NOT DETECTED. The SARS-CoV-2 RNA is generally detectable in upper and lower respiratory specimens during the acute phase of infection. The lowest concentration of SARS-CoV-2 viral copies this assay can detect is 250 copies / mL. A negative result does not preclude SARS-CoV-2 infection and should not be used as the sole basis for treatment or other patient management decisions.  A negative result may occur with improper specimen collection / handling, submission of specimen other than nasopharyngeal swab, presence of viral mutation(s) within the areas targeted by this assay, and inadequate number of viral copies (<250 copies / mL). A negative result  must be combined with clinical observations, patient history, and epidemiological information. Fact Sheet for Patients:   StrictlyIdeas.no Fact Sheet for Healthcare Providers: BankingDealers.co.za This test is not yet approved or cleared  by the [place] FDA and has been authorized for detection and/or diagnosis of SARS-CoV-2 by FDA under an Emergency Use Authorization (EUA).  This EUA will remain in effect (meaning this test can be used) for the duration of the COVID-19 declaration under Section 564(b)(1) of the Act, 21 U.S.C. section 360bbb-3(b)(1), unless the authorization is terminated or revoked sooner. Performed at [facility], [address] [place], [place] [address]   Surgical pcr screen     Status: None   Collection Time: 06/21/19  3:07 AM   Specimen: Nasal Mucosa; Nasal Swab  Result Value Ref Range Status   MRSA, PCR NEGATIVE NEGATIVE Final   Staphylococcus aureus NEGATIVE NEGATIVE Final    Comment: (NOTE) The Xpert SA Assay (FDA approved for NASAL specimens in patients 23 years of age and older), is one component of a comprehensive surveillance program. It is not intended to diagnose infection nor to guide or monitor treatment. Performed at [facility], [address] [place], [place] [address]        Today   Subjective    [name] today has no new complaints  No Nausea, Vomiting or Diarrhea No fever  Or chills        Patient has been seen and examined prior to discharge   Objective   Blood pressure 123/84, pulse (!) 104, temperature 99.7 F (37.6 C), temperature source Oral, resp. rate 20, height 5\' 9"  (1.753 m), weight 90.7 kg, SpO2 97 %.   Intake/Output Summary (Last 24 hours) at 06/22/2019 1402 Last data filed at 06/22/2019 1100 Gross per 24 hour  Intake 460 ml  Output 2050 ml  Net -1590 ml    Exam Gen:- Awake Alert, no acute distress  HEENT:- [place].AT, No sclera icterus Neck-Supple  Neck,No JVD,.  Lungs-  CTAB , good air movement bilaterally  CV- S1, S2 normal, regular Abd-  +ve B.Sounds, Abd Soft, No tenderness,    Extremity/Skin:- No  edema,   good pulses Psych-affect is appropriate, oriented x3 Neuro-no new focal deficits, no tremors MSK-right foot postop wound looks clean dry and intact, sutures are intact-- Media Information   Document Information  Photos    06/22/2019 10:24  Attached To:  Hospital Encounter on 06/16/19  Source Information  Roxan Hockey, MD  [facility]      Data Review   CBC w Diff:  Lab Results  Component Value Date   WBC 7.1 06/17/2019   HGB 13.3 06/17/2019   HCT 38.1 (L) 06/17/2019   PLT 214 06/17/2019   LYMPHOPCT 27 06/16/2019   MONOPCT 7 06/16/2019   EOSPCT 2 06/16/2019   BASOPCT 1 06/16/2019    CMP:  Lab Results  Component Value Date   NA 138 06/20/2019   K 3.6 06/20/2019   CL 103 06/20/2019   CO2 27 06/20/2019   BUN 19 06/20/2019   CREATININE 0.80 06/20/2019   PROT 7.5 06/16/2019   ALBUMIN 4.1 06/16/2019   BILITOT 0.5 06/16/2019   ALKPHOS 93 06/16/2019   AST 20 06/16/2019   ALT 26 06/16/2019  .   Total Discharge time is about 33 minutes  Roxan Hockey M.D on 06/22/2019 at 2:02 PM  Go to [email or web address] -  for contact info  [facility] - Office  [phone]

## 2019-06-22 NOTE — Progress Notes (Signed)
Physical Therapy Treatment Patient Details Name: [name] MRN: [id number] DOB: [birth year] Today's Date: 06/22/2019    History of Present Illness 59 y.o. male, past medical history of hypertension, diabetes mellitus, insulin-dependent, GERD, arthritis, patient presents to ED secondary to fourth digit right lower extremity wound, reports wound has been progressive over last 2 weeks. Pt with osteomyelitis of the right fourth toe, s/p amputation on 06/21/19.    PT Comments    Patient instructed in ambulation using post op shoe on right foot with weightbearing on heel only with good return demonstrated and understanding acknowledged.  Patient will also benefit from use of RW for home - MD/case manager notified.  Patient will benefit from continued physical therapy in hospital and recommended venue below to increase strength, balance, endurance for safe ADLs and gait.   Follow Up Recommendations  Home health PT;Supervision - Intermittent     Equipment Recommendations  Rolling walker with 5" wheels    Recommendations for Other Services       Precautions / Restrictions Precautions Precautions: Fall Restrictions Weight Bearing Restrictions: Yes RLE Weight Bearing: Partial weight bearing RLE Partial Weight Bearing Percentage or Pounds: weightbearing on right heel only with post op shoe    Mobility  Bed Mobility Overal bed mobility: Modified Independent                Transfers Overall transfer level: Needs assistance Equipment used: Rolling walker (2 wheeled) Transfers: Sit to/from [facility] Sit to Stand: Supervision Stand pivot transfers: Supervision       General transfer comment: increased time labored movement  Ambulation/Gait Ambulation/Gait assistance: Supervision Gait Distance (Feet): 15 Feet Assistive device: Rolling walker (2 wheeled) Gait Pattern/deviations: Decreased step length - right;Decreased stance time - right;Decreased stride  length Gait velocity: decreased   General Gait Details: demonstrates good return for taking steps with weightbearing only on right heel wearing post op shoe without loss of balance, limited secondary to c/o fatigue   Stairs             Wheelchair Mobility    Modified Rankin (Stroke Patients Only)       Balance Overall balance assessment: Needs assistance Sitting-balance support: Feet supported;No upper extremity supported Sitting balance-Leahy Scale: Good Sitting balance - Comments: seated EOB   Standing balance support: During functional activity;Bilateral upper extremity supported Standing balance-Leahy Scale: Fair Standing balance comment: using RW and right post op shoe                            Cognition Arousal/Alertness: Awake/alert Behavior During Therapy: WFL for tasks assessed/performed Overall Cognitive Status: Within Functional Limits for tasks assessed                                        Exercises      General Comments        Pertinent Vitals/Pain Pain Assessment: Faces Faces Pain Scale: Hurts even more Pain Location: R foot/amputation site Pain Descriptors / Indicators: Aching;Discomfort;Guarding Pain Intervention(s): Limited activity within patient's tolerance;Monitored during session;Repositioned    Home Living                      Prior Function            PT Goals (current goals can now be found in the care plan section) Acute Rehab  PT Goals Patient Stated Goal: get crutches and feed chickens PT Goal Formulation: With patient Time For Goal Achievement: 06/28/19 Potential to Achieve Goals: Good Progress towards PT goals: Progressing toward goals    Frequency    Min 3X/week      PT Plan Current plan remains appropriate    Co-evaluation              AM-PAC PT "6 Clicks" Mobility   Outcome Measure  Help needed turning from your back to your side while in a flat bed without  using bedrails?: None Help needed moving from lying on your back to sitting on the side of a flat bed without using bedrails?: None Help needed moving to and from a bed to a chair (including a wheelchair)?: A Little Help needed standing up from a chair using your arms (e.g., wheelchair or bedside chair)?: A Little Help needed to walk in hospital room?: A Little Help needed climbing 3-5 steps with a railing? : A Lot 6 Click Score: 19    End of Session   Activity Tolerance: Patient tolerated treatment well;Patient limited by fatigue Patient left: in bed;with call bell/phone within reach Nurse Communication: Mobility status PT Visit Diagnosis: Unsteadiness on feet (R26.81);Other abnormalities of gait and mobility (R26.89);Pain Pain - Right/Left: Right Pain - part of body: Ankle and joints of foot     Time: 1205-1220 PT Time Calculation (min) (ACUTE ONLY): 15 min  Charges:  $Therapeutic Activity: 8-22 mins                     12:33 PM, 06/22/19 Lonell Grandchild, MPT Physical Therapist with [facility] [phone] office [phone] mobile phone

## 2019-06-22 NOTE — Progress Notes (Signed)
Dr. Nathen May in to talk with patient regarding pain meds and pain management upon discharge to home. Pt with current Rx from his PCP, pt states understanding that he is to take his usual pain med regimen at home.

## 2019-06-22 NOTE — Progress Notes (Signed)
1 Day Post-Op  Subjective: Patient has mild incisional pain.  Otherwise doing well.  Objective: Vital signs in last 24 hours: Temp:  [97.8 F (36.6 C)-99.7 F (37.6 C)] 99.7 F (37.6 C) (06/02 0543) Pulse Rate:  [79-104] 104 (06/02 0543) Resp:  [9-20] 20 (06/02 0543) BP: (97-179)/(61-90) 123/84 (06/02 0543) SpO2:  [95 %-100 %] 96 % (06/02 0543) Last BM Date: 06/21/19  Intake/Output from previous day: 06/01 0701 - 06/02 0700 In: 600 [I.V.:500; IV Piggyback:100] Out: 1105 [Urine:1100; Blood:5] Intake/Output this shift: No intake/output data recorded.  General appearance: alert, cooperative and no distress Extremities: Right foot dressing dry and intact.  Lab Results:  No results for input(s): WBC, HGB, HCT, PLT in the last 72 hours. BMET Recent Labs    06/20/19 0553  NA 138  K 3.6  CL 103  CO2 27  GLUCOSE 226*  BUN 19  CREATININE 0.80  CALCIUM 9.0   PT/INR No results for input(s): LABPROT, INR in the last 72 hours.  Studies/Results: No results found.  Anti-infectives: Anti-infectives (From admission, onward)   Start     Dose/Rate Route Frequency Ordered Stop   06/16/19 2200  metroNIDAZOLE (FLAGYL) IVPB 500 mg  Status:  Discontinued     500 mg 100 mL/hr over 60 Minutes Intravenous Every 8 hours 06/16/19 2112 06/21/19 1103   06/16/19 2145  cefTRIAXone (ROCEPHIN) 2 g in sodium chloride 0.9 % 100 mL IVPB     2 g 200 mL/hr over 30 Minutes Intravenous Every 24 hours 06/16/19 2112        Assessment/Plan: s/p Procedure(s): AMPUTATION TOE Impression: Stable on postoperative day 1.  Physical therapy has seen patient.  Okay for discharge.  Instructions for wound care given.  Follow-up in my office in 2 weeks.  LOS: 6 days    Aviva Signs 06/22/2019

## 2019-06-23 LAB — SURGICAL PATHOLOGY

## 2019-06-30 ENCOUNTER — Encounter: Payer: Self-pay | Admitting: General Surgery

## 2019-06-30 ENCOUNTER — Other Ambulatory Visit: Payer: Self-pay

## 2019-06-30 ENCOUNTER — Ambulatory Visit (INDEPENDENT_AMBULATORY_CARE_PROVIDER_SITE_OTHER): Payer: Self-pay | Admitting: General Surgery

## 2019-06-30 VITALS — BP 148/88 | HR 110 | Temp 97.2°F | Resp 18 | Ht 69.0 in | Wt 199.0 lb

## 2019-06-30 DIAGNOSIS — Z09 Encounter for follow-up examination after completed treatment for conditions other than malignant neoplasm: Secondary | ICD-10-CM

## 2019-06-30 MED ORDER — SULFAMETHOXAZOLE-TRIMETHOPRIM 800-160 MG PO TABS: 1.0000 | ORAL_TABLET | Freq: Two times a day (BID) | ORAL | 0 refills | Status: DC

## 2019-06-30 NOTE — Progress Notes (Signed)
Subjective:     [name]  Patient here for postoperative visit, status post a right fourth toe amputation.  Patient denies any fevers.  Minimal incisional pain is noted.  He does have some swelling and redness along the dorsum of the right foot along the lateral aspect.  No purulent drainage is been present.  He did show up with a cane and orthopedic sandal on the right foot. Objective:    BP (!) 148/88   Pulse (!) 110   Temp (!) 97.2 F (36.2 C) (Other (Comment))   Resp 18   Ht 5\' 9"  (1.753 m)   Wt 199 lb (90.3 kg)   SpO2 96%   BMI 29.39 kg/m   General:  alert, cooperative and no distress  Right fourth toe amputation incision site healing well.  No drainage is noted.  There is some slight erythema and edema present along the lateral aspect of the dorsum of the right foot. Final pathology reviewed     Assessment:    Status post right fourth toe amputation with some edema and erythema of the right foot.    Plan:   The edema may be postsurgical in nature.  Will place on Bactrim DS 1 tablet twice a day for 10 days as a precaution.  Follow-up here on 07/12/2019.  Patient was instructed to keep the right foot elevated as much as possible.

## 2019-07-06 ENCOUNTER — Ambulatory Visit (HOSPITAL_COMMUNITY): Payer: Medicaid Other | Attending: Family Medicine | Admitting: Physical Therapy

## 2019-07-07 ENCOUNTER — Ambulatory Visit (INDEPENDENT_AMBULATORY_CARE_PROVIDER_SITE_OTHER): Payer: Medicaid Other | Admitting: Gastroenterology

## 2019-07-12 ENCOUNTER — Encounter: Payer: Self-pay | Admitting: General Surgery

## 2019-07-12 ENCOUNTER — Other Ambulatory Visit: Payer: Self-pay

## 2019-07-12 ENCOUNTER — Ambulatory Visit (INDEPENDENT_AMBULATORY_CARE_PROVIDER_SITE_OTHER): Payer: Self-pay | Admitting: General Surgery

## 2019-07-12 VITALS — BP 141/88 | HR 102 | Temp 97.7°F | Resp 18 | Ht 69.0 in | Wt 196.0 lb

## 2019-07-12 DIAGNOSIS — Z09 Encounter for follow-up examination after completed treatment for conditions other than malignant neoplasm: Secondary | ICD-10-CM

## 2019-07-12 NOTE — Progress Notes (Signed)
Subjective:     [name]  Here for postoperative check, status post right fourth toe amputation.  Patient is doing well.  He is using a cane to help walk.  He denies any fevers.  The pain seems to have resolved.  He has finished his antibiotic course.  He states his blood sugars have been running high.  This is being managed by his primary care physician. Objective:     BP (!) 141/88    Pulse (!) 102    Temp 97.7 F (36.5 C) (Other (Comment))    Resp 18    Ht 5\' 9"  (1.753 m)    Wt 196 lb (88.9 kg)    SpO2 95%    BMI 28.94 kg/m   General:  alert, cooperative and no distress  Right foot with healing fourth toe amputation.  Sutures removed.  No drainage noted.  No erythema noted. Final pathology reviewed.     Assessment:    Doing well postoperatively.    Plan:   May switch to regular tennis shoes.  Follow-up here as needed.

## 2019-07-26 ENCOUNTER — Encounter: Payer: Self-pay | Admitting: General Surgery

## 2019-07-26 ENCOUNTER — Other Ambulatory Visit: Payer: Self-pay

## 2019-07-26 ENCOUNTER — Ambulatory Visit (INDEPENDENT_AMBULATORY_CARE_PROVIDER_SITE_OTHER): Payer: Self-pay | Admitting: General Surgery

## 2019-07-26 VITALS — BP 124/72 | HR 104 | Temp 97.4°F | Resp 16 | Ht 69.0 in | Wt 200.0 lb

## 2019-07-26 DIAGNOSIS — Z09 Encounter for follow-up examination after completed treatment for conditions other than malignant neoplasm: Secondary | ICD-10-CM

## 2019-07-27 MED ORDER — SULFAMETHOXAZOLE-TRIMETHOPRIM 800-160 MG PO TABS: 1.0000 | ORAL_TABLET | Freq: Two times a day (BID) | ORAL | 0 refills | Status: DC

## 2019-07-27 NOTE — Progress Notes (Signed)
Subjective:     [name]  Patient is here for follow-up of his right fourth toe amputation.  He states that recently it started becoming red and increasing in swelling.  It is causing him some pain.  He is still using a cane to walk.  Patient states his blood sugars are under adequate control.  He did take a short course of Bactrim several weeks ago and this helped the swelling and erythema. Objective:    BP 124/72   Pulse (!) 104   Temp (!) 97.4 F (36.3 C) (Temporal)   Resp 16   Ht 5\' 9"  (1.753 m)   Wt 200 lb (90.7 kg)   SpO2 94%   BMI 29.53 kg/m   General:  alert, cooperative and no distress  Right foot with a healed fourth toe amputation with a small superficial opening along the sole aspect.  Minimal erythema is around the wound.  There is no progression of the erythema or swelling in the foot itself.  Dorsalis pedis pulse easily palpable.     Assessment:    Postop inflammation of right fourth toe amputation site.  No abscess present.  Patient is a diabetic.    Plan:   As a precaution, will reorder Bactrim 1 tablet twice a day.  Follow-up in the office in 2 weeks.

## 2019-08-05 ENCOUNTER — Other Ambulatory Visit (HOSPITAL_COMMUNITY): Payer: Self-pay | Admitting: Internal Medicine

## 2019-08-05 ENCOUNTER — Other Ambulatory Visit: Payer: Self-pay | Admitting: Internal Medicine

## 2019-08-05 DIAGNOSIS — M545 Low back pain, unspecified: Secondary | ICD-10-CM

## 2019-08-09 ENCOUNTER — Other Ambulatory Visit: Payer: Self-pay

## 2019-08-09 ENCOUNTER — Encounter: Payer: Self-pay | Admitting: General Surgery

## 2019-08-09 ENCOUNTER — Ambulatory Visit (INDEPENDENT_AMBULATORY_CARE_PROVIDER_SITE_OTHER): Payer: Self-pay | Admitting: General Surgery

## 2019-08-09 VITALS — BP 116/79 | HR 109 | Temp 98.6°F | Resp 16 | Ht 69.0 in | Wt 196.0 lb

## 2019-08-09 DIAGNOSIS — Z09 Encounter for follow-up examination after completed treatment for conditions other than malignant neoplasm: Secondary | ICD-10-CM

## 2019-08-09 NOTE — Progress Notes (Signed)
Subjective:     [name]  Here for follow-up, status post right third toe amputation.  Patient states he has developed back issues for which his primary care physician has ordered an MRI.  He has had no significant drainage from his right foot.  He denies any significant pain in the right foot. Objective:    BP 116/79   Pulse (!) 109   Temp 98.6 F (37 C) (Oral)   Resp 16   Ht 5\' 9"  (1.753 m)   Wt 196 lb (88.9 kg)   SpO2 97%   BMI 28.94 kg/m   General:  alert, cooperative and no distress  Right third toe amputation site healed well.  No significant swelling in the right foot.     Assessment:    Has recovered from his right third toe amputation.  He now has worsening acute on chronic back issues.  This is being addressed by his primary care physician.    Plan:   Follow-up here as needed.

## 2019-08-17 ENCOUNTER — Other Ambulatory Visit (HOSPITAL_COMMUNITY): Payer: Self-pay | Admitting: Internal Medicine

## 2019-08-17 ENCOUNTER — Other Ambulatory Visit: Payer: Self-pay

## 2019-08-17 ENCOUNTER — Ambulatory Visit (HOSPITAL_COMMUNITY)
Admission: RE | Admit: 2019-08-17 | Discharge: 2019-08-17 | Disposition: A | Discharge status: Home / Self Care | Payer: Medicaid Other | Source: Ambulatory Visit | Attending: Internal Medicine | Admitting: Internal Medicine

## 2019-08-17 DIAGNOSIS — M545 Low back pain, unspecified: Secondary | ICD-10-CM

## 2019-08-26 ENCOUNTER — Ambulatory Visit: Payer: Medicaid Other | Admitting: Cardiology

## 2019-08-29 ENCOUNTER — Ambulatory Visit (HOSPITAL_COMMUNITY): Payer: Medicaid Other

## 2019-09-13 ENCOUNTER — Other Ambulatory Visit: Payer: Self-pay | Admitting: Neurosurgery

## 2019-09-20 ENCOUNTER — Ambulatory Visit: Payer: Medicaid Other | Admitting: Cardiology

## 2019-09-28 ENCOUNTER — Other Ambulatory Visit: Payer: Self-pay | Admitting: Neurosurgery

## 2019-10-07 NOTE — Progress Notes (Signed)
[facility] [phone] - [place], [place] AT [place] [address] [place] [address] Phone: [phone] Fax: [phone] Your procedure is scheduled on Wednesday, September 22nd.  Report to [facility] Main Entrance "A" at 5:30 A.M., and check in at the Admitting office.  Call this number if you have problems the morning of surgery:  [phone]  Call [phone] if you have any questions prior to your surgery date Monday-Friday 8am-4pm   Remember:  Do not eat or drink after midnight the night before your surgery   Take these medicines the morning of surgery with A SIP OF WATER  ALPRAZolam (XANAX) amLODipine (NORVASC) atorvastatin (LIPITOR) buPROPion (WELLBUTRIN) diltiazem (TIAZAC)  gabapentin (NEURONTIN)  pantoprazole (PROTONIX)  tiZANidine (ZANAFLEX)   If needed: HYDROcodone-acetaminophen ([place])  Follow your surgeon's instructions on when to stop Aspirin.  If no instructions were given by your surgeon then you will need to call the office to get those instructions.    As of today, STOP taking Aleve, naproxen (NAPROSYN) , Ibuprofen, Motrin, Advil, Goody's, BC's, all herbal medications, fish oil, and all vitamins.  WHAT DO I DO ABOUT MY DIABETES MEDICATION?  ---The night before surgery--- TAKE 24 units of LANTUS SOLOSTAR  (if TYPE 1 diabetic) TAKE 15 units of LANTUS SOLOSTAR  (if TYPE 2 diabetic)  ---The morning of surgery--- Do NOT not take JANUVIA or metFORMIN (GLUCOPHAGE)   HOW TO MANAGE YOUR DIABETES BEFORE AND AFTER SURGERY  Why is it important to control my blood sugar before and after surgery? . Improving blood sugar levels before and after surgery helps healing and can limit problems. . A way of improving blood sugar control is eating a healthy diet by: o  Eating less sugar and carbohydrates o  Increasing activity/exercise o  Talking with your doctor about reaching your blood sugar goals . High blood  sugars (greater than 180 mg/dL) can raise your risk of infections and slow your recovery, so you will need to focus on controlling your diabetes during the weeks before surgery. . Make sure that the doctor who takes care of your diabetes knows about your planned surgery including the date and location.  How do I manage my blood sugar before surgery? . Check your blood sugar at least 4 times a day, starting 2 days before surgery, to make sure that the level is not too high or low. . Check your blood sugar the morning of your surgery when you wake up and every 2 hours until you get to the Short Stay unit. o If your blood sugar is less than 70 mg/dL, you will need to treat for low blood sugar: - Do not take insulin. - Treat a low blood sugar (less than 70 mg/dL) with  cup of clear juice (cranberry or apple), 4 glucose tablets, OR glucose gel. - Recheck blood sugar in 15 minutes after treatment (to make sure it is greater than 70 mg/dL). If your blood sugar is not greater than 70 mg/dL on recheck, call [phone] for further instructions. . Report your blood sugar to the short stay nurse when you get to Short Stay.  . If you are admitted to the hospital after surgery: o Your blood sugar will be checked by the staff and you will probably be given insulin after surgery (instead of oral diabetes medicines) to make sure you have good blood sugar levels. o The goal for blood sugar control after surgery is 80-180 mg/dL.  Do not wear jewelry, make up, or nail polish            Do not wear lotions, powders, perfumes/colognes, or deodorant.            Do not shave 48 hours prior to surgery.  Men may shave face and neck.            Do not bring valuables to the hospital.            [facility] is not responsible for any belongings or valuables.  Do NOT Smoke (Tobacco/Vaping) or drink Alcohol 24 hours prior to your procedure If you use a CPAP at night, you may bring all equipment for  your overnight stay.   Contacts, glasses, dentures or bridgework may not be worn into surgery.      For patients admitted to the hospital, discharge time will be determined by your treatment team.   Patients discharged the day of surgery will not be allowed to drive home, and someone needs to stay with them for 24 hours.  Special instructions:   [facility]- Preparing For Surgery  Before surgery, you can play an important role. Because skin is not sterile, your skin needs to be as free of germs as possible. You can reduce the number of germs on your skin by washing with CHG (chlorahexidine gluconate) Soap before surgery.  CHG is an antiseptic cleaner which kills germs and bonds with the skin to continue killing germs even after washing.    Oral Hygiene is also important to reduce your risk of infection.  Remember - BRUSH YOUR TEETH THE MORNING OF SURGERY WITH YOUR REGULAR TOOTHPASTE  Please do not use if you have an allergy to CHG or antibacterial soaps. If your skin becomes reddened/irritated stop using the CHG.  Do not shave (including legs and underarms) for at least 48 hours prior to first CHG shower. It is OK to shave your face.  Please follow these instructions carefully.   1. Shower the NIGHT BEFORE SURGERY and the MORNING OF SURGERY with CHG Soap.   2. If you chose to wash your hair, wash your hair first as usual with your normal shampoo.  3. After you shampoo, rinse your hair and body thoroughly to remove the shampoo.  4. Use CHG as you would any other liquid soap. You can apply CHG directly to the skin and wash gently with a scrungie or a clean washcloth.   5. Apply the CHG Soap to your body ONLY FROM THE NECK DOWN.  Do not use on open wounds or open sores. Avoid contact with your eyes, ears, mouth and genitals (private parts). Wash Face and genitals (private parts)  with your normal soap.   6. Wash thoroughly, paying special attention to the area where your surgery will be  performed.  7. Thoroughly rinse your body with warm water from the neck down.  8. DO NOT shower/wash with your normal soap after using and rinsing off the CHG Soap.  9. Pat yourself dry with a CLEAN TOWEL.  10. Wear CLEAN PAJAMAS to bed the night before surgery  11. Place CLEAN SHEETS on your bed the night of your first shower and DO NOT SLEEP WITH PETS.  Day of Surgery: Wear Clean/Comfortable clothing the morning of surgery Do not apply any deodorants/lotions.   Remember to brush your teeth WITH YOUR REGULAR TOOTHPASTE.   Please read over the following fact sheets that you were given.

## 2019-10-10 ENCOUNTER — Encounter (HOSPITAL_COMMUNITY): Payer: Self-pay

## 2019-10-10 ENCOUNTER — Other Ambulatory Visit (HOSPITAL_COMMUNITY)
Admission: RE | Admit: 2019-10-10 | Discharge: 2019-10-10 | Disposition: A | Discharge status: Home / Self Care | Payer: Medicaid Other | Source: Ambulatory Visit | Attending: Neurosurgery | Admitting: Neurosurgery

## 2019-10-10 ENCOUNTER — Other Ambulatory Visit: Payer: Self-pay

## 2019-10-10 ENCOUNTER — Encounter (HOSPITAL_COMMUNITY)
Admission: RE | Admit: 2019-10-10 | Discharge: 2019-10-10 | Disposition: A | Discharge status: Home / Self Care | Payer: Medicaid Other | Source: Ambulatory Visit | Attending: Neurosurgery | Admitting: Neurosurgery

## 2019-10-10 DIAGNOSIS — Z20822 Contact with and (suspected) exposure to covid-19: Secondary | ICD-10-CM | POA: Insufficient documentation

## 2019-10-10 DIAGNOSIS — Z01812 Encounter for preprocedural laboratory examination: Secondary | ICD-10-CM | POA: Insufficient documentation

## 2019-10-10 HISTORY — DX: Personal history of urinary calculi: Z87.442

## 2019-10-10 LAB — TYPE AND SCREEN
ABO/RH(D): B POS
Antibody Screen: NEGATIVE

## 2019-10-10 LAB — CBC
HCT: 46.8 % (ref 39.0–52.0)
Hemoglobin: 15.9 g/dL (ref 13.0–17.0)
MCH: 31.5 pg (ref 26.0–34.0)
MCHC: 34 g/dL (ref 30.0–36.0)
MCV: 92.9 fL (ref 80.0–100.0)
Platelets: 145 10*3/uL — ABNORMAL LOW (ref 150–400)
RBC: 5.04 MIL/uL (ref 4.22–5.81)
RDW: 15.8 % — ABNORMAL HIGH (ref 11.5–15.5)
WBC: 9.2 10*3/uL (ref 4.0–10.5)
nRBC: 0 % (ref 0.0–0.2)

## 2019-10-10 LAB — BASIC METABOLIC PANEL
Anion gap: 14 (ref 5–15)
BUN: 22 mg/dL — ABNORMAL HIGH (ref 6–20)
CO2: 20 mmol/L — ABNORMAL LOW (ref 22–32)
Calcium: 9.5 mg/dL (ref 8.9–10.3)
Chloride: 107 mmol/L (ref 98–111)
Creatinine, Ser: 1.06 mg/dL (ref 0.61–1.24)
GFR calc Af Amer: >60 mL/min (ref >60)
GFR calc non Af Amer: >60 mL/min (ref >60)
Glucose, Bld: 157 mg/dL — ABNORMAL HIGH (ref 70–99)
Potassium: 3.7 mmol/L (ref 3.5–5.1)
Sodium: 141 mmol/L (ref 135–145)

## 2019-10-10 LAB — GLUCOSE, CAPILLARY: Glucose-Capillary: 155 mg/dL — ABNORMAL HIGH (ref 70–99)

## 2019-10-10 LAB — SURGICAL PCR SCREEN
MRSA, PCR: NEGATIVE
Staphylococcus aureus: POSITIVE — AB

## 2019-10-10 LAB — SARS CORONAVIRUS 2 (TAT 6-24 HRS): SARS Coronavirus 2: NEGATIVE

## 2019-10-10 NOTE — Progress Notes (Signed)
PCP - Dr. [name] Cardiologist - denies  Chest x-ray - n/a EKG - 03/16/19 Stress Test - denies ECHO - denies Cardiac Cath - 2008  Sleep Study - denies CPAP - denies  Fasting Blood Sugar - 230-300; pt states that this range is good for him as he has struggled with controlled sugars in the past.  Checks Blood Sugar __1___ times a day CBG at PAT: 155; Pt states that last A1C was just a few weeks ago in Dr. Nolon Rod office and was told it was fine. Most recent labs and last office note requested. A1C was not drawn in PAT today.    Blood Thinner Instructions:N/A Aspirin Instructions:Pt received a letter from office stating to hold ASA 5 days prior to surgery. Pt states that surgery was rescheduled at one point and the scheduler advised him to only hold ASA one day prior to surgery. LD 10/10/19.  COVID TEST- 10/10/19, after PAT appointment.    Anesthesia review: Yes, pending A1C results from PCP.  Patient denies shortness of breath, fever, cough and chest pain at PAT appointment   All instructions explained to the patient, with a verbal understanding of the material. Patient agrees to go over the instructions while at home for a better understanding. Patient also instructed to self quarantine after being tested for COVID-19. The opportunity to ask questions was provided.    Coronavirus Screening  Have you experienced the following symptoms:  Cough yes/no: No Fever (>100.5F)  yes/no: No Runny nose yes/no: No Sore throat yes/no: No Difficulty breathing/shortness of breath  yes/no: No  Have you or a family member traveled in the last 14 days and where? yes/no: No   If the patient indicates "YES" to the above questions, their PAT will be rescheduled to limit the exposure to others and, the surgeon will be notified. THE PATIENT WILL NEED TO BE ASYMPTOMATIC FOR 14 DAYS.   If the patient is not experiencing any of these symptoms, the PAT nurse will instruct them to NOT bring anyone  with them to their appointment since they may have these symptoms or traveled as well.   Please remind your patients and families that hospital visitation restrictions are in effect and the importance of the restrictions.

## 2019-10-10 NOTE — Progress Notes (Signed)
Your procedure is scheduled on Wednesday, September 22nd at 7:30a.m.  Report to [facility] Main Entrance "A" at 5:30 A.M., and check in at the Admitting office.  Call this number if you have problems the morning of surgery:  [phone]  Call [phone] if you have any questions prior to your surgery date Monday-Friday 8am-4pm   Remember:  Do not eat or drink after midnight the night before your surgery   Take these medicines the morning of surgery with A SIP OF WATER  ALPRAZolam (XANAX) amLODipine (NORVASC) atorvastatin (LIPITOR) buPROPion (WELLBUTRIN) diltiazem (TIAZAC)  gabapentin (NEURONTIN)  pantoprazole (PROTONIX)  tiZANidine (ZANAFLEX)   If needed: HYDROcodone-acetaminophen ([place])  Follow your surgeon's instructions on when to stop Aspirin.  If no instructions were given by your surgeon then you will need to call the office to get those instructions.    As of today, STOP taking Aleve, naproxen (NAPROSYN) , Ibuprofen, Motrin, Advil, Goody's, BC's, all herbal medications, fish oil, and all vitamins.  WHAT DO I DO ABOUT MY DIABETES MEDICATION?  ---The night before surgery--- TAKE 15 units of LANTUS SOLOSTAR  (if TYPE 2 diabetic)  ---The morning of surgery--- Do NOT not take JANUVIA or metFORMIN (GLUCOPHAGE)   HOW TO MANAGE YOUR DIABETES BEFORE AND AFTER SURGERY  Why is it important to control my blood sugar before and after surgery? . Improving blood sugar levels before and after surgery helps healing and can limit problems. . A way of improving blood sugar control is eating a healthy diet by: o  Eating less sugar and carbohydrates o  Increasing activity/exercise o  Talking with your doctor about reaching your blood sugar goals . High blood sugars (greater than 180 mg/dL) can raise your risk of infections and slow your recovery, so you will need to focus on controlling your diabetes during the weeks before surgery. . Make sure that the doctor who takes care of your  diabetes knows about your planned surgery including the date and location.  How do I manage my blood sugar before surgery? . Check your blood sugar at least 4 times a day, starting 2 days before surgery, to make sure that the level is not too high or low. . Check your blood sugar the morning of your surgery when you wake up and every 2 hours until you get to the Short Stay unit. o If your blood sugar is less than 70 mg/dL, you will need to treat for low blood sugar: - Do not take insulin. - Treat a low blood sugar (less than 70 mg/dL) with  cup of clear juice (cranberry or apple), 4 glucose tablets, OR glucose gel. - Recheck blood sugar in 15 minutes after treatment (to make sure it is greater than 70 mg/dL). If your blood sugar is not greater than 70 mg/dL on recheck, call [phone] for further instructions. . Report your blood sugar to the short stay nurse when you get to Short Stay.  . If you are admitted to the hospital after surgery: o Your blood sugar will be checked by the staff and you will probably be given insulin after surgery (instead of oral diabetes medicines) to make sure you have good blood sugar levels. o The goal for blood sugar control after surgery is 80-180 mg/dL.                     Do not wear jewelry, make up, or nail polish  Do not wear lotions, powders, perfumes/colognes, or deodorant.            Do not shave 48 hours prior to surgery.  Men may shave face and neck.            Do not bring valuables to the hospital.            [facility] is not responsible for any belongings or valuables.  Do NOT Smoke (Tobacco/Vaping) or drink Alcohol 24 hours prior to your procedure If you use a CPAP at night, you may bring all equipment for your overnight stay.   Contacts, glasses, dentures or bridgework may not be worn into surgery.      For patients admitted to the hospital, discharge time will be determined by your treatment team.   Patients discharged the day  of surgery will not be allowed to drive home, and someone needs to stay with them for 24 hours.  Special instructions:   [facility]- Preparing For Surgery  Before surgery, you can play an important role. Because skin is not sterile, your skin needs to be as free of germs as possible. You can reduce the number of germs on your skin by washing with CHG (chlorahexidine gluconate) Soap before surgery.  CHG is an antiseptic cleaner which kills germs and bonds with the skin to continue killing germs even after washing.    Oral Hygiene is also important to reduce your risk of infection.  Remember - BRUSH YOUR TEETH THE MORNING OF SURGERY WITH YOUR REGULAR TOOTHPASTE  Please do not use if you have an allergy to CHG or antibacterial soaps. If your skin becomes reddened/irritated stop using the CHG.  Do not shave (including legs and underarms) for at least 48 hours prior to first CHG shower. It is OK to shave your face.  Please follow these instructions carefully.   1. Shower the NIGHT BEFORE SURGERY and the MORNING OF SURGERY with CHG Soap.   2. If you chose to wash your hair, wash your hair first as usual with your normal shampoo.  3. After you shampoo, rinse your hair and body thoroughly to remove the shampoo.  4. Use CHG as you would any other liquid soap. You can apply CHG directly to the skin and wash gently with a scrungie or a clean washcloth.   5. Apply the CHG Soap to your body ONLY FROM THE NECK DOWN.  Do not use on open wounds or open sores. Avoid contact with your eyes, ears, mouth and genitals (private parts). Wash Face and genitals (private parts)  with your normal soap.   6. Wash thoroughly, paying special attention to the area where your surgery will be performed.  7. Thoroughly rinse your body with warm water from the neck down.  8. DO NOT shower/wash with your normal soap after using and rinsing off the CHG Soap.  9. Pat yourself dry with a CLEAN TOWEL.  10. Wear CLEAN  PAJAMAS to bed the night before surgery  11. Place CLEAN SHEETS on your bed the night of your first shower and DO NOT SLEEP WITH PETS.  Day of Surgery: Wear Clean/Comfortable clothing the morning of surgery Do not apply any deodorants/lotions.   Remember to brush your teeth WITH YOUR REGULAR TOOTHPASTE.   Please read over the following fact sheets that you were given.

## 2019-10-11 NOTE — Anesthesia Preprocedure Evaluation (Addendum)
Anesthesia Evaluation  Patient identified by MRN, date of birth, ID band Patient awake    Reviewed: Allergy & Precautions, H&P , NPO status , Patient's Chart, lab work & pertinent test results  Airway Mallampati: II  TM Distance: >3 FB Neck ROM: Full    Dental no notable dental hx. (+) Teeth Intact, Dental Advisory Given   Pulmonary neg pulmonary ROS, former smoker,    Pulmonary exam normal breath sounds clear to auscultation       Cardiovascular Exercise Tolerance: Good hypertension, Pt. on medications  Rhythm:Regular Rate:Normal     Neuro/Psych  Headaches, negative psych ROS   GI/Hepatic Neg liver ROS, hiatal hernia, PUD, GERD  Medicated,  Endo/Other  diabetes, Insulin Dependent, Oral Hypoglycemic Agents  Renal/GU negative Renal ROS  negative genitourinary   Musculoskeletal  (+) Arthritis , Osteoarthritis,    Abdominal   Peds  Hematology negative hematology ROS (+)   Anesthesia Other Findings   Reproductive/Obstetrics negative OB ROS                            Anesthesia Physical Anesthesia Plan  ASA: III  Anesthesia Plan: General   Post-op Pain Management:    Induction: Intravenous  PONV Risk Score and Plan: 3 and Ondansetron, Midazolam, Treatment may vary due to age or medical condition and Diphenhydramine  Airway Management Planned: Oral ETT  Additional Equipment:   Intra-op Plan:   Post-operative Plan: Extubation in OR  Informed Consent: I have reviewed the patients History and Physical, chart, labs and discussed the procedure including the risks, benefits and alternatives for the proposed anesthesia with the patient or authorized representative who has indicated his/her understanding and acceptance.     Dental advisory given  Plan Discussed with: CRNA  Anesthesia Plan Comments:        Anesthesia Quick Evaluation

## 2019-10-11 NOTE — Progress Notes (Signed)
Anesthesia Chart Review:  Case: [id number] Date/Time: 10/12/19 0715   Procedure: LAMINECTOMY,POSTERIOR LUMBAR INTERBODY FUSION, INTERBODY PROSTHESIS, POSTERIOR INSTRUMENTATION LUMBAR 3- LUMBAR 4 (N/A ) - 3C   Anesthesia type: General   Pre-op diagnosis: SPINAL STENOSIS, LUMBAR REGION WITH NEUROGENIC CLAUDICATION   Location: [place] OR ROOM 19 / [facility]   Surgeons: Newman Pies, MD      DISCUSSION: Patient is a 59 year old male scheduled for the above procedure.   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 Normal coronaries in 2008.  Interestingly, he reported to his PAT RN that his fasting CBGS can run up to 300, but his A1c on 09/27/19 was 6.9% [facility]), down from 9.2% on 06/16/19. Glucose 157 at PAT. I notified Nikki at Dr. Arnoldo Morale' office regarding A1c and reported home CBGs. He will get a fasting CBG on arrival.   Reported that he was told to hold ASA 81 mg 1 day (last dose 10/10/19) instead of 5 days for surgery. I also notified Lexine Baton of this and she will review with Dr. Arnoldo Morale.   He denied shortness of breath, cough, fever, chest pain at PAT RN visit.  10/10/19 presurgical COVID-19 test negative. Anesthesia team to evaluate on the day of surgery.   VS: BP (!) 146/86   Pulse 97   Temp 36.8 C (Oral)   Resp 17   Ht 5\' 9"  (1.753 m)   Wt 89.6 kg   SpO2 99%   BMI 29.17 kg/m    PROVIDERS: [name], MD is PCP. Last visit 09/27/19 for HTN and DM follow-up. A1c improved to 6.9%.   Carlyle Dolly, MD is cardiologist. He was evaluated 03/18/19 following ED evaluation for chest pain/epigastric pain (constant for 72 hours), ruled out MI. He felt symptoms not consistent with cardiac chest pain, and tenderness in epigastric region would suggest GI etiology. Referral to GI recommended. He did start patient on chlorthalidone for HTN and recommended at least  moderate strength statin given DM history.    LABS: Labs reviewed: Acceptable for surgery. A1c 6.9% on 09/27/19 at [facility]. (all labs ordered are listed, but only abnormal results are displayed)  Labs Reviewed  SURGICAL PCR SCREEN - Abnormal; Notable for the following components:      Result Value   Staphylococcus aureus POSITIVE (*)    All other components within normal limits  GLUCOSE, CAPILLARY - Abnormal; Notable for the following components:   Glucose-Capillary 155 (*)    All other components within normal limits  BASIC METABOLIC PANEL - Abnormal; Notable for the following components:   CO2 20 (*)    Glucose, Bld 157 (*)    BUN 22 (*)    All other components within normal limits  CBC - Abnormal; Notable for the following components:   RDW 15.8 (*)    Platelets 145 (*)    All other components within normal limits  TYPE AND SCREEN    IMAGES: MRI L-spine 08/17/19: IMPRESSION: 1. Overall worsening of multilevel lumbar spondylosis. Findings most pronounced at L3-4 where there is moderate-to-severe canal stenosis with severe left and moderate-to-severe right foraminal stenosis. 2. Moderate-to-severe bilateral foraminal stenosis at L5-S1, similar to prior. 3. Increased central disc protrusion at T12-L1 with approximately 6 mm of cranial extension of disc material. Mild canal stenosis and mild bilateral foraminal stenosis at this level, progressed from prior.  CXR 03/16/19: FINDINGS: The heart size and mediastinal contours  are within normal limits. Both lungs are clear. Disc degenerative disease of the thoracic spine. IMPRESSION: No acute abnormality of the lungs.   EKG: 03/16/19: Sinus tachycardia at 108 bpm Left posterior fascicular block Confirmed by Lajean Saver [phone]) on 03/16/2019 1:21:45 PM - Low r wave in aVL, otherwise EKG does not appear significantly changed when compared to 01/30/16 tracing.    CV: Cardiac cath 11/10/06: IMPRESSION:    Mr. Maland has essentially normal coronary arteries and normal LV function without evidence of dissection.  I believe his chest pain is noncardiac.   Past Medical History:  Diagnosis Date  . Arthritis   . Diabetes mellitus   . GERD (gastroesophageal reflux disease)   . Headache(784.0)   . History of kidney stones   . Hypertension   . Kidney stones     Past Surgical History:  Procedure Laterality Date  . AMPUTATION TOE Right 06/21/2019   Procedure: AMPUTATION TOE;  Surgeon: Aviva Signs, MD;  Location: [facility] ORS;  Service: General;  Laterality: Right;  . CARDIAC CATHETERIZATION  2009  . CHOLECYSTECTOMY N/A 10/24/2013   Procedure: LAPAROSCOPIC CHOLECYSTECTOMY;  Surgeon: Jamesetta So, MD;  Location: [facility] ORS;  Service: General;  Laterality: N/A;  . COLONOSCOPY N/A 03/31/2013   Procedure: COLONOSCOPY;  Surgeon: Rogene Houston, MD;  Location: [facility] END[facility] SU[facility];  Service: Endoscopy;  Laterality: N/A;  730    MEDICATIONS: . acetaminophen (TYLENOL) 325 MG tablet  . ALPRAZolam (XANAX) 0.5 MG tablet  . amLODipine (NORVASC) 5 MG tablet  . aspirin EC 81 MG tablet  . atorvastatin (LIPITOR) 20 MG tablet  . buPROPion (WELLBUTRIN) 100 MG tablet  . diltiazem (TIAZAC) 420 MG 24 hr capsule  . gabapentin (NEURONTIN) 800 MG tablet  . HYDROcodone-acetaminophen (NORCO) 10-325 MG tablet  . insulin aspart (NOVOLOG) 100 UNIT/ML FlexPen  . JANUVIA 100 MG tablet  . LANTUS SOLOSTAR 100 UNIT/ML Solostar Pen  . Menthol-Methyl Salicylate (MUSCLE RUB) 10-15 % CREA  . metFORMIN (GLUCOPHAGE) 500 MG tablet  . Multiple Vitamin (MULTIVITAMIN WITH MINERALS) TABS tablet  . naproxen (NAPROSYN) 500 MG tablet  . pantoprazole (PROTONIX) 40 MG tablet  . polyethylene glycol (MIRALAX / GLYCOLAX) 17 g packet  . sulfamethoxazole-trimethoprim (BACTRIM DS) 800-160 MG tablet  . tiZANidine (ZANAFLEX) 4 MG tablet   No current facility-administered medications for this encounter.    Myra Gianotti, PA-C Surgical Short  Stay/Anesthesiology [facility] Phone [phone] [facility] Phone [phone] 10/11/2019 10:19 AM

## 2019-10-12 ENCOUNTER — Inpatient Hospital Stay (HOSPITAL_COMMUNITY)
Admission: RE | Admit: 2019-10-12 | Discharge: 2019-10-13 | DRG: 455 | Disposition: A | Discharge status: Home / Self Care | Payer: Medicaid Other | Attending: Neurosurgery | Admitting: Neurosurgery

## 2019-10-12 ENCOUNTER — Inpatient Hospital Stay (HOSPITAL_COMMUNITY)
Admission: RE | Disposition: A | Discharge status: Home / Self Care | Payer: Self-pay | Source: Home / Self Care | Attending: Neurosurgery

## 2019-10-12 ENCOUNTER — Inpatient Hospital Stay (HOSPITAL_COMMUNITY): Payer: Medicaid Other

## 2019-10-12 ENCOUNTER — Inpatient Hospital Stay (HOSPITAL_COMMUNITY): Payer: Medicaid Other | Admitting: Anesthesiology

## 2019-10-12 ENCOUNTER — Inpatient Hospital Stay (HOSPITAL_COMMUNITY): Payer: Medicaid Other | Admitting: Vascular Surgery

## 2019-10-12 ENCOUNTER — Other Ambulatory Visit: Payer: Self-pay

## 2019-10-12 ENCOUNTER — Encounter (HOSPITAL_COMMUNITY): Payer: Self-pay | Admitting: Neurosurgery

## 2019-10-12 DIAGNOSIS — E119 Type 2 diabetes mellitus without complications: Secondary | ICD-10-CM | POA: Diagnosis present

## 2019-10-12 DIAGNOSIS — I10 Essential (primary) hypertension: Secondary | ICD-10-CM | POA: Diagnosis present

## 2019-10-12 DIAGNOSIS — M48062 Spinal stenosis, lumbar region with neurogenic claudication: Secondary | ICD-10-CM | POA: Diagnosis present

## 2019-10-12 DIAGNOSIS — M5116 Intervertebral disc disorders with radiculopathy, lumbar region: Secondary | ICD-10-CM | POA: Diagnosis present

## 2019-10-12 DIAGNOSIS — Z91040 Latex allergy status: Secondary | ICD-10-CM

## 2019-10-12 DIAGNOSIS — Z79899 Other long term (current) drug therapy: Secondary | ICD-10-CM

## 2019-10-12 DIAGNOSIS — M4316 Spondylolisthesis, lumbar region: Principal | ICD-10-CM | POA: Diagnosis present

## 2019-10-12 DIAGNOSIS — Z8249 Family history of ischemic heart disease and other diseases of the circulatory system: Secondary | ICD-10-CM | POA: Diagnosis not present

## 2019-10-12 DIAGNOSIS — M4726 Other spondylosis with radiculopathy, lumbar region: Secondary | ICD-10-CM | POA: Diagnosis present

## 2019-10-12 DIAGNOSIS — Z419 Encounter for procedure for purposes other than remedying health state, unspecified: Secondary | ICD-10-CM

## 2019-10-12 DIAGNOSIS — M4186 Other forms of scoliosis, lumbar region: Secondary | ICD-10-CM | POA: Diagnosis present

## 2019-10-12 DIAGNOSIS — Z87891 Personal history of nicotine dependence: Secondary | ICD-10-CM | POA: Diagnosis not present

## 2019-10-12 DIAGNOSIS — K219 Gastro-esophageal reflux disease without esophagitis: Secondary | ICD-10-CM | POA: Diagnosis present

## 2019-10-12 DIAGNOSIS — Z794 Long term (current) use of insulin: Secondary | ICD-10-CM

## 2019-10-12 DIAGNOSIS — Z833 Family history of diabetes mellitus: Secondary | ICD-10-CM | POA: Diagnosis not present

## 2019-10-12 DIAGNOSIS — Z7982 Long term (current) use of aspirin: Secondary | ICD-10-CM

## 2019-10-12 LAB — GLUCOSE, CAPILLARY
Glucose-Capillary: 158 mg/dL — ABNORMAL HIGH (ref 70–99)
Glucose-Capillary: 236 mg/dL — ABNORMAL HIGH (ref 70–99)
Glucose-Capillary: 225 mg/dL — ABNORMAL HIGH (ref 70–99)
Glucose-Capillary: 240 mg/dL — ABNORMAL HIGH (ref 70–99)
Glucose-Capillary: 225 mg/dL — ABNORMAL HIGH (ref 70–99)

## 2019-10-12 LAB — HEMOGLOBIN A1C
Hgb A1c MFr Bld: 7.3 % — ABNORMAL HIGH (ref 4.8–5.6)
Mean Plasma Glucose: 162.81 mg/dL

## 2019-10-12 LAB — ABO/RH: ABO/RH(D): B POS

## 2019-10-12 SURGERY — POSTERIOR LUMBAR FUSION 1 LEVEL
Anesthesia: General

## 2019-10-12 MED ORDER — DEXAMETHASONE SODIUM PHOSPHATE 10 MG/ML IJ SOLN
INTRAMUSCULAR | Status: AC
  Filled 2019-10-12: qty 1

## 2019-10-12 MED ORDER — MIDAZOLAM HCL 5 MG/5ML IJ SOLN
INTRAMUSCULAR | Status: DC | PRN
  Administered 2019-10-12: 2 mg via INTRAVENOUS

## 2019-10-12 MED ORDER — BUPIVACAINE LIPOSOME 1.3 % IJ SUSP
INTRAMUSCULAR | Status: DC | PRN
  Administered 2019-10-12: 20 mL

## 2019-10-12 MED ORDER — SODIUM CHLORIDE 0.9 % IV SOLN: 250.0000 mL | INTRAVENOUS | Status: DC

## 2019-10-12 MED ORDER — INSULIN ASPART 100 UNIT/ML ~~LOC~~ SOLN
0.0000 [IU] | Freq: Three times a day (TID) | SUBCUTANEOUS | Status: DC
  Administered 2019-10-12: 7 [IU] via SUBCUTANEOUS

## 2019-10-12 MED ORDER — CEFAZOLIN SODIUM-DEXTROSE 2-4 GM/100ML-% IV SOLN
2.0000 g | INTRAVENOUS | Status: AC
  Administered 2019-10-12: 2 g via INTRAVENOUS

## 2019-10-12 MED ORDER — OXYCODONE HCL 5 MG PO TABS
10.0000 mg | ORAL_TABLET | ORAL | Status: DC | PRN
  Administered 2019-10-12 – 2019-10-13 (×7): 10 mg via ORAL
  Filled 2019-10-12 (×6): qty 2

## 2019-10-12 MED ORDER — ONDANSETRON HCL 4 MG/2ML IJ SOLN
INTRAMUSCULAR | Status: AC
  Filled 2019-10-12: qty 2

## 2019-10-12 MED ORDER — MIDAZOLAM HCL 2 MG/2ML IJ SOLN
INTRAMUSCULAR | Status: AC
  Filled 2019-10-12: qty 2

## 2019-10-12 MED ORDER — PHENYLEPHRINE HCL-NACL 10-0.9 MG/250ML-% IV SOLN
INTRAVENOUS | Status: DC | PRN
  Administered 2019-10-12: 40 ug/min via INTRAVENOUS

## 2019-10-12 MED ORDER — LINAGLIPTIN 5 MG PO TABS
5.0000 mg | ORAL_TABLET | Freq: Every day | ORAL | Status: DC
  Administered 2019-10-12: 5 mg via ORAL
  Filled 2019-10-12 (×2): qty 1

## 2019-10-12 MED ORDER — ROCURONIUM BROMIDE 10 MG/ML (PF) SYRINGE
PREFILLED_SYRINGE | INTRAVENOUS | Status: AC
  Filled 2019-10-12: qty 20

## 2019-10-12 MED ORDER — INSULIN ASPART 100 UNIT/ML ~~LOC~~ SOLN
SUBCUTANEOUS | Status: AC
  Administered 2019-10-12: 8 [IU]
  Filled 2019-10-12: qty 1

## 2019-10-12 MED ORDER — ALPRAZOLAM 0.5 MG PO TABS
0.5000 mg | ORAL_TABLET | Freq: Two times a day (BID) | ORAL | Status: DC
  Administered 2019-10-12: 0.5 mg via ORAL
  Filled 2019-10-12: qty 1

## 2019-10-12 MED ORDER — KETAMINE HCL 10 MG/ML IJ SOLN
INTRAMUSCULAR | Status: DC | PRN
  Administered 2019-10-12: 50 mg via INTRAVENOUS

## 2019-10-12 MED ORDER — FENTANYL CITRATE (PF) 250 MCG/5ML IJ SOLN
INTRAMUSCULAR | Status: AC
Start: 2019-10-12 — End: ?
  Filled 2019-10-12: qty 5

## 2019-10-12 MED ORDER — CHLORHEXIDINE GLUCONATE 0.12 % MT SOLN
15.0000 mL | Freq: Once | OROMUCOSAL | Status: AC
  Administered 2019-10-12: 15 mL via OROMUCOSAL
  Filled 2019-10-12: qty 15

## 2019-10-12 MED ORDER — GABAPENTIN 800 MG PO TABS
800.0000 mg | ORAL_TABLET | Freq: Three times a day (TID) | ORAL | Status: DC
  Filled 2019-10-12 (×2): qty 1

## 2019-10-12 MED ORDER — SODIUM CHLORIDE 0.9% FLUSH: 3.0000 mL | Freq: Two times a day (BID) | INTRAVENOUS | Status: DC

## 2019-10-12 MED ORDER — TIZANIDINE HCL 4 MG PO TABS
4.0000 mg | ORAL_TABLET | Freq: Four times a day (QID) | ORAL | Status: DC | PRN
  Administered 2019-10-12 – 2019-10-13 (×3): 4 mg via ORAL
  Filled 2019-10-12 (×3): qty 1

## 2019-10-12 MED ORDER — ONDANSETRON HCL 4 MG/2ML IJ SOLN
INTRAMUSCULAR | Status: DC | PRN
  Administered 2019-10-12: 4 mg via INTRAVENOUS

## 2019-10-12 MED ORDER — METFORMIN HCL 500 MG PO TABS: 1000.0000 mg | ORAL_TABLET | Freq: Every day | ORAL | Status: DC

## 2019-10-12 MED ORDER — LACTATED RINGERS IV SOLN: INTRAVENOUS | Status: DC

## 2019-10-12 MED ORDER — ACETAMINOPHEN 500 MG PO TABS
1000.0000 mg | ORAL_TABLET | Freq: Four times a day (QID) | ORAL | Status: AC
  Administered 2019-10-12 – 2019-10-13 (×4): 1000 mg via ORAL
  Filled 2019-10-12 (×4): qty 2

## 2019-10-12 MED ORDER — ACETAMINOPHEN 325 MG PO TABS: 650.0000 mg | ORAL_TABLET | ORAL | Status: DC | PRN

## 2019-10-12 MED ORDER — ACETAMINOPHEN 500 MG PO TABS
500.0000 mg | ORAL_TABLET | Freq: Once | ORAL | Status: AC
  Administered 2019-10-12: 500 mg via ORAL

## 2019-10-12 MED ORDER — HYDROMORPHONE HCL 1 MG/ML IJ SOLN
INTRAMUSCULAR | Status: AC
  Administered 2019-10-12: 0.5 mg via INTRAVENOUS
  Filled 2019-10-12: qty 2

## 2019-10-12 MED ORDER — SUGAMMADEX SODIUM 200 MG/2ML IV SOLN
INTRAVENOUS | Status: DC | PRN
  Administered 2019-10-12: 400 mg via INTRAVENOUS

## 2019-10-12 MED ORDER — DILTIAZEM HCL ER BEADS 300 MG PO CP24
420.0000 mg | ORAL_CAPSULE | Freq: Every day | ORAL | Status: DC
  Filled 2019-10-12: qty 1

## 2019-10-12 MED ORDER — CEFAZOLIN SODIUM-DEXTROSE 2-4 GM/100ML-% IV SOLN
2.0000 g | Freq: Three times a day (TID) | INTRAVENOUS | Status: AC
  Administered 2019-10-12 (×2): 2 g via INTRAVENOUS
  Filled 2019-10-12 (×2): qty 100

## 2019-10-12 MED ORDER — PHENYLEPHRINE 40 MCG/ML (10ML) SYRINGE FOR IV PUSH (FOR BLOOD PRESSURE SUPPORT)
PREFILLED_SYRINGE | INTRAVENOUS | Status: AC
  Filled 2019-10-12: qty 10

## 2019-10-12 MED ORDER — BACITRACIN ZINC 500 UNIT/GM EX OINT
TOPICAL_OINTMENT | CUTANEOUS | Status: AC
  Filled 2019-10-12: qty 28.35

## 2019-10-12 MED ORDER — THROMBIN 5000 UNITS EX SOLR
OROMUCOSAL | Status: DC | PRN
  Administered 2019-10-12 (×2): 5 mL via TOPICAL

## 2019-10-12 MED ORDER — LIDOCAINE 2% (20 MG/ML) 5 ML SYRINGE
INTRAMUSCULAR | Status: AC
  Filled 2019-10-12: qty 5

## 2019-10-12 MED ORDER — 0.9 % SODIUM CHLORIDE (POUR BTL) OPTIME
TOPICAL | Status: DC | PRN
  Administered 2019-10-12: 1000 mL

## 2019-10-12 MED ORDER — ATORVASTATIN CALCIUM 10 MG PO TABS: 20.0000 mg | ORAL_TABLET | Freq: Every day | ORAL | Status: DC

## 2019-10-12 MED ORDER — ACETAMINOPHEN 650 MG RE SUPP: 650.0000 mg | RECTAL | Status: DC | PRN

## 2019-10-12 MED ORDER — DEXAMETHASONE SODIUM PHOSPHATE 10 MG/ML IJ SOLN
INTRAMUSCULAR | Status: DC | PRN
  Administered 2019-10-12: 5 mg via INTRAVENOUS

## 2019-10-12 MED ORDER — BISACODYL 10 MG RE SUPP: 10.0000 mg | Freq: Every day | RECTAL | Status: DC | PRN

## 2019-10-12 MED ORDER — PROPOFOL 10 MG/ML IV BOLUS
INTRAVENOUS | Status: AC
  Filled 2019-10-12: qty 20

## 2019-10-12 MED ORDER — KETAMINE HCL 50 MG/5ML IJ SOSY
PREFILLED_SYRINGE | INTRAMUSCULAR | Status: AC
  Filled 2019-10-12: qty 5

## 2019-10-12 MED ORDER — INSULIN ASPART 100 UNIT/ML ~~LOC~~ SOLN
0.0000 [IU] | Freq: Every day | SUBCUTANEOUS | Status: DC
  Administered 2019-10-12: 2 [IU] via SUBCUTANEOUS

## 2019-10-12 MED ORDER — SODIUM CHLORIDE 0.9% FLUSH: 3.0000 mL | INTRAVENOUS | Status: DC | PRN

## 2019-10-12 MED ORDER — ALBUMIN HUMAN 5 % IV SOLN: INTRAVENOUS | Status: DC | PRN

## 2019-10-12 MED ORDER — ONDANSETRON HCL 4 MG PO TABS
4.0000 mg | ORAL_TABLET | Freq: Four times a day (QID) | ORAL | Status: DC | PRN
  Filled 2019-10-12: qty 1

## 2019-10-12 MED ORDER — OXYCODONE HCL 5 MG PO TABS
ORAL_TABLET | ORAL | Status: AC
  Filled 2019-10-12: qty 1

## 2019-10-12 MED ORDER — THROMBIN 5000 UNITS EX SOLR
CUTANEOUS | Status: AC
  Filled 2019-10-12: qty 5000

## 2019-10-12 MED ORDER — BUPIVACAINE-EPINEPHRINE 0.5% -1:200000 IJ SOLN
INTRAMUSCULAR | Status: AC
  Filled 2019-10-12: qty 1

## 2019-10-12 MED ORDER — FENTANYL CITRATE (PF) 100 MCG/2ML IJ SOLN
INTRAMUSCULAR | Status: DC | PRN
Start: 2019-10-12 — End: 2019-10-12
  Administered 2019-10-12 (×2): 50 ug via INTRAVENOUS
  Administered 2019-10-12: 100 ug via INTRAVENOUS
  Administered 2019-10-12: 50 ug via INTRAVENOUS
  Administered 2019-10-12: 100 ug via INTRAVENOUS

## 2019-10-12 MED ORDER — CHLORHEXIDINE GLUCONATE CLOTH 2 % EX PADS: 6.0000 | MEDICATED_PAD | Freq: Once | CUTANEOUS | Status: DC

## 2019-10-12 MED ORDER — AMLODIPINE BESYLATE 5 MG PO TABS: 5.0000 mg | ORAL_TABLET | Freq: Every day | ORAL | Status: DC

## 2019-10-12 MED ORDER — HYDROCODONE-ACETAMINOPHEN 10-325 MG PO TABS: 1.0000 | ORAL_TABLET | ORAL | Status: DC | PRN

## 2019-10-12 MED ORDER — ROCURONIUM BROMIDE 100 MG/10ML IV SOLN
INTRAVENOUS | Status: DC | PRN
  Administered 2019-10-12: 50 mg via INTRAVENOUS
  Administered 2019-10-12: 70 mg via INTRAVENOUS
  Administered 2019-10-12: 30 mg via INTRAVENOUS

## 2019-10-12 MED ORDER — PHENYLEPHRINE HCL (PRESSORS) 10 MG/ML IV SOLN
INTRAVENOUS | Status: DC | PRN
  Administered 2019-10-12: 80 ug via INTRAVENOUS

## 2019-10-12 MED ORDER — ORAL CARE MOUTH RINSE: 15.0000 mL | Freq: Once | OROMUCOSAL | Status: AC

## 2019-10-12 MED ORDER — FENTANYL CITRATE (PF) 250 MCG/5ML IJ SOLN
INTRAMUSCULAR | Status: AC
  Filled 2019-10-12: qty 5

## 2019-10-12 MED ORDER — ONDANSETRON HCL 4 MG/2ML IJ SOLN: 4.0000 mg | Freq: Four times a day (QID) | INTRAMUSCULAR | Status: DC | PRN

## 2019-10-12 MED ORDER — DEXMEDETOMIDINE (PRECEDEX) IN NS 20 MCG/5ML (4 MCG/ML) IV SYRINGE
PREFILLED_SYRINGE | INTRAVENOUS | Status: DC | PRN
  Administered 2019-10-12 (×2): 8 ug via INTRAVENOUS

## 2019-10-12 MED ORDER — BUPIVACAINE LIPOSOME 1.3 % IJ SUSP
20.0000 mL | Freq: Once | INTRAMUSCULAR | Status: DC
  Filled 2019-10-12: qty 20

## 2019-10-12 MED ORDER — CEFAZOLIN SODIUM-DEXTROSE 2-4 GM/100ML-% IV SOLN
INTRAVENOUS | Status: AC
  Filled 2019-10-12: qty 100

## 2019-10-12 MED ORDER — ACETAMINOPHEN 500 MG PO TABS
1000.0000 mg | ORAL_TABLET | Freq: Once | ORAL | Status: DC
  Filled 2019-10-12: qty 2

## 2019-10-12 MED ORDER — OXYCODONE HCL 5 MG PO TABS: 5.0000 mg | ORAL_TABLET | ORAL | Status: DC | PRN

## 2019-10-12 MED ORDER — GABAPENTIN 400 MG PO CAPS
800.0000 mg | ORAL_CAPSULE | Freq: Three times a day (TID) | ORAL | Status: DC
  Administered 2019-10-12 (×2): 800 mg via ORAL
  Filled 2019-10-12 (×2): qty 2

## 2019-10-12 MED ORDER — MENTHOL 3 MG MT LOZG: 1.0000 | LOZENGE | OROMUCOSAL | Status: DC | PRN

## 2019-10-12 MED ORDER — BACITRACIN ZINC 500 UNIT/GM EX OINT
TOPICAL_OINTMENT | CUTANEOUS | Status: DC | PRN
  Administered 2019-10-12: 1 via TOPICAL

## 2019-10-12 MED ORDER — LIDOCAINE HCL (CARDIAC) PF 100 MG/5ML IV SOSY
PREFILLED_SYRINGE | INTRAVENOUS | Status: DC | PRN
  Administered 2019-10-12: 60 mg via INTRAVENOUS

## 2019-10-12 MED ORDER — HYDROMORPHONE HCL 1 MG/ML IJ SOLN
0.2500 mg | INTRAMUSCULAR | Status: DC | PRN
  Administered 2019-10-12 (×3): 0.5 mg via INTRAVENOUS

## 2019-10-12 MED ORDER — INSULIN ASPART 100 UNIT/ML ~~LOC~~ SOLN
0.0000 [IU] | Freq: Three times a day (TID) | SUBCUTANEOUS | Status: DC
  Administered 2019-10-13: 11 [IU] via SUBCUTANEOUS

## 2019-10-12 MED ORDER — PROPOFOL 10 MG/ML IV BOLUS
INTRAVENOUS | Status: DC | PRN
  Administered 2019-10-12: 150 mg via INTRAVENOUS

## 2019-10-12 MED ORDER — PANTOPRAZOLE SODIUM 40 MG PO TBEC: 40.0000 mg | DELAYED_RELEASE_TABLET | Freq: Every day | ORAL | Status: DC

## 2019-10-12 MED ORDER — MORPHINE SULFATE (PF) 4 MG/ML IV SOLN: 4.0000 mg | INTRAVENOUS | Status: DC | PRN

## 2019-10-12 MED ORDER — PHENOL 1.4 % MT LIQD
1.0000 | OROMUCOSAL | Status: DC | PRN
  Administered 2019-10-12: 1 via OROMUCOSAL
  Filled 2019-10-12: qty 177

## 2019-10-12 MED ORDER — BUPIVACAINE-EPINEPHRINE (PF) 0.5% -1:200000 IJ SOLN
INTRAMUSCULAR | Status: DC | PRN
  Administered 2019-10-12: 10 mL

## 2019-10-12 MED ORDER — BUPROPION HCL 100 MG PO TABS
100.0000 mg | ORAL_TABLET | Freq: Two times a day (BID) | ORAL | Status: DC
  Administered 2019-10-12: 100 mg via ORAL
  Filled 2019-10-12 (×3): qty 1

## 2019-10-12 MED ORDER — DOCUSATE SODIUM 100 MG PO CAPS
100.0000 mg | ORAL_CAPSULE | Freq: Two times a day (BID) | ORAL | Status: DC
  Administered 2019-10-12 (×2): 100 mg via ORAL
  Filled 2019-10-12 (×2): qty 1

## 2019-10-12 SURGICAL SUPPLY — 64 items
BAG DECANTER FOR FLEXI CONT (MISCELLANEOUS) ×2 IMPLANT
BASKET BONE COLLECTION (BASKET) ×2 IMPLANT
BENZOIN TINCTURE PRP APPL 2/3 (GAUZE/BANDAGES/DRESSINGS) ×2 IMPLANT
BLADE CLIPPER SURG (BLADE) IMPLANT
BUR MATCHSTICK NEURO 3.0 LAGG (BURR) ×2 IMPLANT
BUR PRECISION FLUTE 6.0 (BURR) ×2 IMPLANT
CAGE ALTERA 10X31X9-13 15D (Cage) ×2 IMPLANT
CANISTER SUCT 3000ML PPV (MISCELLANEOUS) ×2 IMPLANT
CAP LOCK DLX THRD (Cap) ×8 IMPLANT
CARTRIDGE OIL MAESTRO DRILL (MISCELLANEOUS) ×1 IMPLANT
CNTNR URN SCR LID CUP LEK RST (MISCELLANEOUS) ×1 IMPLANT
CONT SPEC 4OZ STRL OR WHT (MISCELLANEOUS) ×2
COVER BACK TABLE 60X90IN (DRAPES) ×2 IMPLANT
COVER WAND RF STERILE (DRAPES) ×2 IMPLANT
DECANTER SPIKE VIAL GLASS SM (MISCELLANEOUS) ×2 IMPLANT
DIFFUSER DRILL AIR PNEUMATIC (MISCELLANEOUS) ×2 IMPLANT
DRAPE C-ARM 42X72 X-RAY (DRAPES) ×4 IMPLANT
DRAPE HALF SHEET 40X57 (DRAPES) ×2 IMPLANT
DRAPE LAPAROTOMY 100X72X124 (DRAPES) ×2 IMPLANT
DRAPE SURG 17X23 STRL (DRAPES) ×8 IMPLANT
DRSG OPSITE POSTOP 4X6 (GAUZE/BANDAGES/DRESSINGS) ×2 IMPLANT
ELECT BLADE 4.0 EZ CLEAN MEGAD (MISCELLANEOUS) ×2
ELECT REM PT RETURN 9FT ADLT (ELECTROSURGICAL) ×2
ELECTRODE BLDE 4.0 EZ CLN MEGD (MISCELLANEOUS) ×1 IMPLANT
ELECTRODE REM PT RTRN 9FT ADLT (ELECTROSURGICAL) ×1 IMPLANT
EVACUATOR 1/8 PVC DRAIN (DRAIN) IMPLANT
GAUZE 4X4 16PLY RFD (DISPOSABLE) ×2 IMPLANT
GLOVE BIO SURGEON STRL SZ8 (GLOVE) ×4 IMPLANT
GLOVE BIO SURGEON STRL SZ8.5 (GLOVE) ×4 IMPLANT
GLOVE EXAM NITRILE XL STR (GLOVE) IMPLANT
GOWN STRL REUS W/ TWL LRG LVL3 (GOWN DISPOSABLE) IMPLANT
GOWN STRL REUS W/ TWL XL LVL3 (GOWN DISPOSABLE) ×2 IMPLANT
GOWN STRL REUS W/TWL 2XL LVL3 (GOWN DISPOSABLE) IMPLANT
GOWN STRL REUS W/TWL LRG LVL3 (GOWN DISPOSABLE)
GOWN STRL REUS W/TWL XL LVL3 (GOWN DISPOSABLE) ×4
HEMOSTAT POWDER KIT SURGIFOAM (HEMOSTASIS) ×4 IMPLANT
KIT BASIN OR (CUSTOM PROCEDURE TRAY) ×2 IMPLANT
KIT TURNOVER KIT B (KITS) ×2 IMPLANT
MILL MEDIUM DISP (BLADE) ×2 IMPLANT
NEEDLE HYPO 21X1.5 SAFETY (NEEDLE) IMPLANT
NEEDLE HYPO 22GX1.5 SAFETY (NEEDLE) ×2 IMPLANT
NS IRRIG 1000ML POUR BTL (IV SOLUTION) ×2 IMPLANT
OIL CARTRIDGE MAESTRO DRILL (MISCELLANEOUS) ×2
PACK LAMINECTOMY NEURO (CUSTOM PROCEDURE TRAY) ×2 IMPLANT
PAD ARMBOARD 7.5X6 YLW CONV (MISCELLANEOUS) ×6 IMPLANT
PATTIES SURGICAL .5 X1 (DISPOSABLE) IMPLANT
PATTIES SURGICAL 1X1 (DISPOSABLE) ×2 IMPLANT
PUTTY DBM 10CC CALC GRAN (Putty) ×2 IMPLANT
ROD CURVED TI 6.35X50 (Rod) ×4 IMPLANT
SCREW PA CREO DLX 6.5X50 (Screw) ×8 IMPLANT
SPONGE LAP 4X18 RFD (DISPOSABLE) IMPLANT
SPONGE NEURO XRAY DETECT 1X3 (DISPOSABLE) IMPLANT
SPONGE SURGIFOAM ABS GEL 100 (HEMOSTASIS) IMPLANT
STRIP CLOSURE SKIN 1/2X4 (GAUZE/BANDAGES/DRESSINGS) ×2 IMPLANT
SUT VIC AB 1 CT1 18XBRD ANBCTR (SUTURE) ×2 IMPLANT
SUT VIC AB 1 CT1 8-18 (SUTURE) ×4
SUT VIC AB 2-0 CP2 18 (SUTURE) ×4 IMPLANT
SYR 20ML LL LF (SYRINGE) IMPLANT
TOWEL GREEN STERILE (TOWEL DISPOSABLE) ×2 IMPLANT
TOWEL GREEN STERILE FF (TOWEL DISPOSABLE) ×2 IMPLANT
TRAY FOL W/BAG SLVR 16FR STRL (SET/KITS/TRAYS/PACK) ×1 IMPLANT
TRAY FOLEY MTR SLVR 16FR STAT (SET/KITS/TRAYS/PACK) IMPLANT
TRAY FOLEY W/BAG SLVR 16FR LF (SET/KITS/TRAYS/PACK) ×2
WATER STERILE IRR 1000ML POUR (IV SOLUTION) ×2 IMPLANT

## 2019-10-12 NOTE — Progress Notes (Signed)
Orthopedic Tech Progress Note Patient Details:  [name] 1960/06/13 [id number] Patient already has brace per RN. Patient ID: [name], male   DOB: 11-28-1960, 59 y.o.   MRN: [id number]   Petra Kuba 10/12/2019, 3:16 PM

## 2019-10-12 NOTE — Op Note (Signed)
Brief history: The patient is a 59 year old male who has had previous lumbar surgery by another physician.  He has had chronic and worsening back and leg pain.  He has failed medical management.  He was worked up with a lumbar MRI and lumbar x-rays which demonstrated a degenerative scoliosis, multilevel degenerative changes, etc. he has severe stenosis at L3-4.  I discussed the various treatment options with him.  He is decided to proceed with an L3-4 decompression, instrumentation and fusion after weighing the risks, benefits and alternatives.  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 Etc. requiring a wide decompression of the nerve roots.);  L3-4 transforaminal lumbar interbody fusion with local morselized autograft bone and Zimmer DBM; insertion of interbody prosthesis at L3-4 (globus peek expandable interbody prosthesis); posterior nonsegmental instrumentation from L3 to L4 with globus titanium pedicle screws and rods; posterior lateral arthrodesis at L3-4 with local morselized autograft bone and Zimmer DBM.  Surgeon: Dr. Earle Gell  Asst.: Arnetha Massy, NP  Anesthesia: Gen. endotracheal  Estimated blood loss: 200 cc  Drains: None  Complications: None  Description of procedure: The patient was brought to the operating room by the anesthesia team. General endotracheal anesthesia was induced. The patient was turned to the prone position on the Wilson frame. The patient's lumbosacral  region was then prepared with Betadine scrub and Betadine solution. Sterile drapes were applied.  I then injected the area to be incised with Marcaine with epinephrine solution. I then used the scalpel to make a linear midline incision over the L3-4 interspace. I then used electrocautery to dissect through the scar tissue and to perform a bilateral subperiosteal dissection exposing the facets and the remainder of the lamina at L3-4 bilaterally. We then obtained intraoperative radiograph to confirm our location. We then inserted the Verstrac retractor to provide exposure.  I began the decompression by using the high speed drill to perform redo laminotomies at L3-4 bilaterally. We then used the Kerrison punches to widen the laminectomy and removed the epidural scar tissue and residual ligamentum flavum at L3-4 bilaterally. We used the Kerrison punches to remove the medial facets at L3-4 bilaterally. We performed wide foraminotomies about the bilateral L3 and L4 nerve roots completing the decompression.  We now turned our attention to the posterior lumbar interbody fusion. I used a scalpel to incise the intervertebral disc at L3-4 bilaterally. I then performed a partial intervertebral discectomy at L3-4 bilaterally using the pituitary forceps. We prepared the vertebral endplates at G6-2 bilaterally for the fusion by removing the soft tissues with the curettes. We then used the trial spacers to pick the appropriate sized interbody prosthesis. We prefilled his prosthesis with a combination of local morselized autograft bone that we obtained during the decompression as well as Zimmer DBM. We inserted the prefilled prosthesis into the interspace at L3-4 from the right, we then turned and expanded the prosthesis. There was a good snug fit of the prosthesis in the interspace. We then filled and the remainder of the intervertebral disc space with local morselized autograft bone and Zimmer DBM. This completed the  posterior lumbar interbody arthrodesis.  During the decompression and insertion of the prosthesis the assistant protected the thecal sac and nerve roots with the D'Errico retractor.  We now turned attention to the instrumentation. Under fluoroscopic guidance we cannulated the bilateral L3 and L4 pedicles with the bone probe. We then removed the bone probe. We then tapped the pedicle with a 6.5 millimeter tap. We then removed the tap. We probed inside the tapped pedicle with a ball probe to rule out cortical breaches. We then inserted a 6.5 x 50 millimeter pedicle screw into the L3 and L4 pedicles bilaterally under fluoroscopic guidance. We then palpated along the medial aspect of the pedicles to rule out cortical breaches. There were none. The nerve roots were not injured. We then connected the unilateral pedicle screws with a lordotic rod. We compressed the construct and secured the rod in place with the caps. We then tightened the caps appropriately. This completed the instrumentation from L3-4 bilaterally.  We now turned our attention to the posterior lateral arthrodesis at L3-4 bilaterally. We used the high-speed drill to decorticate the remainder of the facets, pars, transverse process at L3-4 bilaterally. We then applied a combination of local morselized autograft bone and Zimmer DBM over these decorticated posterior lateral structures. This completed the posterior lateral arthrodesis.  We then obtained hemostasis using bipolar electrocautery. We irrigated the wound out with bacitracin solution. We inspected the thecal sac and nerve roots and noted they were well decompressed. We then removed the retractor.  We injected Exparel . We reapproximated patient's thoracolumbar fascia with interrupted #1 Vicryl suture. We reapproximated patient's subcutaneous tissue with interrupted 2-0 Vicryl suture. The reapproximated patient's skin with Steri-Strips and benzoin. The wound was then coated with bacitracin  ointment. A sterile dressing was applied. The drapes were removed. The patient was subsequently returned to the supine position where they were extubated by the anesthesia team. He was then transported to the post anesthesia care unit in stable condition. All sponge instrument and needle counts were reportedly correct at the end of this case.

## 2019-10-12 NOTE — Progress Notes (Signed)
Subjective: The patient is alert and pleasant.  He is in no apparent distress.  Objective: Vital signs in last 24 hours: Temp:  [97.9 F (36.6 C)-98.4 F (36.9 C)] 98.4 F (36.9 C) (09/22 1130) Pulse Rate:  [87-95] 88 (09/22 1250) Resp:  [10-20] 13 (09/22 1250) BP: (107-165)/(62-91) 123/70 (09/22 1250) SpO2:  [94 %-98 %] 96 % (09/22 1250) Weight:  [89.6 kg] 89.6 kg (09/22 0653) Estimated body mass index is 29.17 kg/m as calculated from the following:   Height as of this encounter: 5\' 9"  (1.753 m).   Weight as of this encounter: 89.6 kg.   Intake/Output from previous day: No intake/output data recorded. Intake/Output this shift: Total I/O In: 1750 [I.V.:1500; IV Piggyback:250] Out: 350 [Urine:100; Blood:250]  Physical exam the patient is alert.  He is moving his lower extremities well.  Lab Results: Recent Labs    10/10/19 1210  WBC 9.2  HGB 15.9  HCT 46.8  PLT 145*   BMET Recent Labs    10/10/19 1210  NA 141  K 3.7  CL 107  CO2 20*  GLUCOSE 157*  BUN 22*  CREATININE 1.06  CALCIUM 9.5    Studies/Results: DG Lumbar Spine 2-3 Views  Result Date: 10/12/2019 CLINICAL DATA:  L3-4 posterior fusion EXAM: LUMBAR SPINE - 2-3 VIEW; DG C-ARM 1-60 MIN COMPARISON:  None. FINDINGS: Intraoperative imaging demonstrates posterior fusion changes at L3-4. No hardware complicating feature. IMPRESSION: Intraoperative imaging as above. Electronically Signed   By: Rolm Baptise M.D.   On: 10/12/2019 11:38   DG Lumbar Spine 1 View  Result Date: 10/12/2019 CLINICAL DATA:  Surgical posterior fusion of L3-4. EXAM: LUMBAR SPINE - 1 VIEW COMPARISON:  August 17, 2019. FINDINGS: Single intraoperative cross-table lateral projection of the lumbar spine demonstrates a surgical probe directed toward the posterior margin of the L3-4 disc space. IMPRESSION: Surgical probe directed toward posterior margin of L3-4 disc space. Electronically Signed   By: Marijo Conception M.D.   On: 10/12/2019 10:50    DG C-Arm 1-60 Min  Result Date: 10/12/2019 CLINICAL DATA:  L3-4 posterior fusion EXAM: LUMBAR SPINE - 2-3 VIEW; DG C-ARM 1-60 MIN COMPARISON:  None. FINDINGS: Intraoperative imaging demonstrates posterior fusion changes at L3-4. No hardware complicating feature. IMPRESSION: Intraoperative imaging as above. Electronically Signed   By: Rolm Baptise M.D.   On: 10/12/2019 11:38    Assessment/Plan: The patient is doing well.  I attempted to contact his daughter.  Her phone number is not in service and she is not in the waiting room.  LOS: 0 days     Ophelia Charter 10/12/2019, 1:08 PM

## 2019-10-12 NOTE — H&P (Signed)
Subjective: The patient is a 59 year old male who was at previous lumbar laminectomy by another physician.  He has developed recurrent back and right greater left leg pain consistent with neurogenic claudication.  He has failed medical management.  He was worked up with a lumbar MRI which demonstrated a L3-4 spinal listhesis and severe stenosis.  I discussed the various treatment options with him he decided to proceed with surgery.  Past Medical History:  Diagnosis Date  . Arthritis   . Diabetes mellitus   . GERD (gastroesophageal reflux disease)   . Headache(784.0)   . History of kidney stones   . Hypertension   . Kidney stones     Past Surgical History:  Procedure Laterality Date  . AMPUTATION TOE Right 06/21/2019   Procedure: AMPUTATION TOE;  Surgeon: Aviva Signs, MD;  Location: [facility] ORS;  Service: General;  Laterality: Right;  . CARDIAC CATHETERIZATION  2009  . CHOLECYSTECTOMY N/A 10/24/2013   Procedure: LAPAROSCOPIC CHOLECYSTECTOMY;  Surgeon: Jamesetta So, MD;  Location: [facility] ORS;  Service: General;  Laterality: N/A;  . COLONOSCOPY N/A 03/31/2013   Procedure: COLONOSCOPY;  Surgeon: Rogene Houston, MD;  Location: [facility];  Service: Endoscopy;  Laterality: N/A;  730    Allergies  Allergen Reactions  . Latex Rash and Other (See Comments)    Redness     Social History   Tobacco Use  . Smoking status: Former Smoker    Types: Cigarettes  . Smokeless tobacco: Never Used  Substance Use Topics  . Alcohol use: No    Family History  Problem Relation Age of Onset  . Diabetes Mother   . Heart failure Mother   . Heart disease Mother   . Heart failure Father    Prior to Admission medications   Medication Sig Start Date End Date Taking? Authorizing Provider  ALPRAZolam Duanne Moron) 0.5 MG tablet Take 0.5 mg by mouth in the morning and at bedtime.  02/25/15  Yes [provider]  amLODipine (NORVASC) 5 MG tablet Take 5 mg by mouth daily.   Yes [provider]  aspirin  EC 81 MG tablet Take 1 tablet (81 mg total) by mouth daily with breakfast. 06/22/19  Yes Emokpae, Courage, MD  atorvastatin (LIPITOR) 20 MG tablet Take 20 mg by mouth daily. 03/31/19  Yes [provider]  buPROPion (WELLBUTRIN) 100 MG tablet Take 100 mg by mouth 2 (two) times daily. 02/24/19  Yes [provider]  diltiazem (TIAZAC) 420 MG 24 hr capsule Take 420 mg by mouth daily. 02/20/19  Yes [provider]  gabapentin (NEURONTIN) 800 MG tablet Take 800 mg by mouth 3 (three) times daily. 08/29/19  Yes [provider]  HYDROcodone-acetaminophen (NORCO) 10-325 MG tablet Take 1 tablet by mouth every 4 (four) hours as needed for moderate pain.  01/25/16  Yes [provider]  JANUVIA 100 MG tablet Take 100 mg by mouth daily. 11/10/18  Yes [provider]  LANTUS SOLOSTAR 100 UNIT/ML Solostar Pen Inject 30 Units into the skin at bedtime. 09/19/19  Yes [provider]  Menthol-Methyl Salicylate (MUSCLE RUB) 10-15 % CREA Apply 1 application topically 4 (four) times daily as needed for muscle pain.   Yes [provider]  metFORMIN (GLUCOPHAGE) 500 MG tablet Take 1,000 mg by mouth in the morning and at bedtime. In the morning & 1300   Yes [provider]  Multiple Vitamin (MULTIVITAMIN WITH MINERALS) TABS tablet Take 1 tablet by mouth daily.   Yes [provider]  naproxen (NAPROSYN) 500 MG tablet Take 500 mg by mouth 2 (two) times daily. 08/29/19  Yes [provider]  pantoprazole (PROTONIX) 40 MG tablet Take 1 tablet (40 mg total) by mouth daily. 03/18/19  Yes Branch, Alphonse Guild, MD  tiZANidine (ZANAFLEX) 4 MG tablet Take 4 mg by mouth in the morning and at bedtime.  03/11/19  Yes [provider]  acetaminophen (TYLENOL) 325 MG tablet Take 2 tablets (650 mg total) by mouth every 4 (four) hours as needed for mild pain or headache. Patient not taking: Reported on 10/05/2019 06/22/19   Roxan Hockey, MD  insulin  aspart (NOVOLOG) 100 UNIT/ML FlexPen Inject 0-10 Units into the skin 3 (three) times daily with meals. insulin aspart (novoLOG) injection 0-10 Units 0-10 Units Subcutaneous, 3 times daily with meals CBG < 70: Implement Hypoglycemia Standing Orders and refer to Hypoglycemia Standing Orders sidebar report  CBG 70 - 120: 0 unit CBG 121 - 150: 0 unit  CBG 151 - 200: 1 unit CBG 201 - 250: 2 units CBG 251 - 300: 4 units CBG 301 - 350: 6 units  CBG 351 - 400: 8 units  CBG > 400: 10 units Patient not taking: Reported on 10/05/2019 06/22/19   Roxan Hockey, MD  polyethylene glycol (MIRALAX / GLYCOLAX) 17 g packet Take 17 g by mouth daily as needed for mild constipation. Patient not taking: Reported on 10/05/2019 06/22/19   Roxan Hockey, MD  sulfamethoxazole-trimethoprim (BACTRIM DS) 800-160 MG tablet Take 1 tablet by mouth 2 (two) times daily. Patient not taking: Reported on 10/05/2019 07/27/19   Aviva Signs, MD     Review of Systems  Positive ROS: As above  All other systems have been reviewed and were otherwise negative with the exception of those mentioned in the HPI and as above.  Objective: Vital signs in last 24 hours: Temp:  [97.9 F (36.6 C)] 97.9 F (36.6 C) (09/22 0626) Pulse Rate:  [95] 95 (09/22 0626) Resp:  [20] 20 (09/22 0626) BP: (165)/(91) 165/91 (09/22 0626) SpO2:  [98 %] 98 % (09/22 0626) Weight:  [89.6 kg] 89.6 kg (09/22 0653) Estimated body mass index is 29.17 kg/m as calculated from the following:   Height as of this encounter: 5\' 9"  (1.753 m).   Weight as of this encounter: 89.6 kg.   General Appearance: Alert Head: Normocephalic, without obvious abnormality, atraumatic Eyes: PERRL, conjunctiva/corneas clear, EOM's intact,    Ears: Normal  Throat: Normal  Neck: Supple, Back: His lumbar incision is well-healed. Lungs: Clear to auscultation bilaterally, respirations unlabored Heart: Regular rate and rhythm, no murmur, rub or gallop Abdomen: Soft,  non-tender Extremities: Extremities normal, atraumatic, no cyanosis or edema Skin: unremarkable  NEUROLOGIC:   Mental status: alert and oriented,Motor Exam - grossly normal Sensory Exam - grossly normal Reflexes:  Coordination - grossly normal Gait - grossly normal Balance - grossly normal Cranial Nerves: I: smell Not tested  II: visual acuity  OS: Normal  OD: Normal   II: visual fields Full to confrontation  II: pupils Equal, round, reactive to light  III,VII: ptosis None  III,IV,VI: extraocular muscles  Full ROM  V: mastication Normal  V: facial light touch sensation  Normal  V,VII: corneal reflex  Present  VII: facial muscle function - upper  Normal  VII: facial muscle function - lower Normal  VIII: hearing Not tested  IX: soft palate elevation  Normal  IX,X: gag reflex Present  XI: trapezius strength  5/5  XI: sternocleidomastoid  strength 5/5  XI: neck flexion strength  5/5  XII: tongue strength  Normal    Data Review Lab Results  Component Value Date   WBC 9.2 10/10/2019   HGB 15.9 10/10/2019   HCT 46.8 10/10/2019   MCV 92.9 10/10/2019   PLT 145 (L) 10/10/2019   Lab Results  Component Value Date   NA 141 10/10/2019   K 3.7 10/10/2019   CL 107 10/10/2019   CO2 20 (L) 10/10/2019   BUN 22 (H) 10/10/2019   CREATININE 1.06 10/10/2019   GLUCOSE 157 (H) 10/10/2019   Lab Results  Component Value Date   INR 1.1 08/15/2008    Assessment/Plan: 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  I have reviewed his imaging studies with him and pointed out the abnormalities.  We have discussed the various treatment options including surgery.  I have described the surgical treatment option of an L3-4 decompression, instrumentation and fusion.  I have shown him surgical models.  I have given him a surgical pamphlet.  We have discussed the risk, benefits, alternatives,  expected postoperative course, and likelihood of achieving our goals with surgery.  I have answered all his questions.  He has decided proceed with surgery.   Ophelia Charter 10/12/2019 7:23 AM

## 2019-10-12 NOTE — Anesthesia Postprocedure Evaluation (Signed)
Anesthesia Post Note  Patient: BUZZ AXEL  Procedure(s) Performed: [place] Distance LUMBAR INTERBODY FUSION, INTERBODY PROSTHESIS, POSTERIOR INSTRUMENTATION LUMBAR THREE- LUMBAR FOUR (N/A )     Patient location during evaluation: PACU Anesthesia Type: General Level of consciousness: awake and alert Pain management: pain level controlled Vital Signs Assessment: post-procedure vital signs reviewed and stable Respiratory status: spontaneous breathing, nonlabored ventilation and respiratory function stable Cardiovascular status: blood pressure returned to baseline and stable Postop Assessment: no apparent nausea or vomiting Anesthetic complications: no   No complications documented.  Last Vitals:  Vitals:   10/12/19 1250 10/12/19 1305  BP: 123/70 118/78  Pulse: 88 92  Resp: 13 15  Temp:  36.7 C  SpO2: 96% 96%    Last Pain:  Vitals:   10/12/19 1305  PainSc: 7                  Zebulen Simonis,W. EDMOND

## 2019-10-12 NOTE — Transfer of Care (Signed)
Immediate Anesthesia Transfer of Care Note  Patient: [name]  Procedure(s) Performed: [place] Distance LUMBAR INTERBODY FUSION, INTERBODY PROSTHESIS, POSTERIOR INSTRUMENTATION LUMBAR THREE- LUMBAR FOUR (N/A )  Patient Location: PACU  Anesthesia Type:General  Level of Consciousness: awake, alert  and oriented  Airway & Oxygen Therapy: Patient Spontanous Breathing and Patient connected to face mask oxygen  Post-op Assessment: Report given to RN, Post -op Vital signs reviewed and stable and Patient moving all extremities  Post vital signs: Reviewed and stable  Last Vitals:  Vitals Value Taken Time  BP 110/62 10/12/19 1133  Temp    Pulse 91 10/12/19 1138  Resp 12 10/12/19 1138  SpO2 93 % 10/12/19 1138  Vitals shown include unvalidated device data.  Last Pain:  Vitals:   10/12/19 0652  PainSc: 10-Worst pain ever      Patients Stated Pain Goal: 2 (32/12/24 8250)  Complications: No complications documented.

## 2019-10-12 NOTE — Anesthesia Procedure Notes (Signed)
Procedure Name: Intubation Date/Time: 10/12/2019 7:38 AM Performed by: Jorryn Casagrande T, CRNA Pre-anesthesia Checklist: Patient identified, Emergency Drugs available, Suction available and Patient being monitored Patient Re-evaluated:Patient Re-evaluated prior to induction Oxygen Delivery Method: Circle system utilized Preoxygenation: Pre-oxygenation with 100% oxygen Induction Type: IV induction Ventilation: Mask ventilation without difficulty and Oral airway inserted - appropriate to patient size Laryngoscope Size: Miller and 3 Grade View: Grade I Tube type: Oral Number of attempts: 1 Airway Equipment and Method: Stylet and Oral airway Placement Confirmation: ETT inserted through vocal cords under direct vision,  positive ETCO2 and breath sounds checked- equal and bilateral Secured at: 23 cm Tube secured with: Tape Dental Injury: Teeth and Oropharynx as per pre-operative assessment

## 2019-10-13 ENCOUNTER — Other Ambulatory Visit (HOSPITAL_COMMUNITY): Payer: Medicaid Other

## 2019-10-13 LAB — GLUCOSE, CAPILLARY: Glucose-Capillary: 268 mg/dL — ABNORMAL HIGH (ref 70–99)

## 2019-10-13 LAB — BASIC METABOLIC PANEL
Anion gap: 9 (ref 5–15)
BUN: 15 mg/dL (ref 6–20)
CO2: 25 mmol/L (ref 22–32)
Calcium: 8.1 mg/dL — ABNORMAL LOW (ref 8.9–10.3)
Chloride: 104 mmol/L (ref 98–111)
Creatinine, Ser: 0.77 mg/dL (ref 0.61–1.24)
GFR calc Af Amer: >60 mL/min (ref >60)
GFR calc non Af Amer: >60 mL/min (ref >60)
Glucose, Bld: 274 mg/dL — ABNORMAL HIGH (ref 70–99)
Potassium: 4 mmol/L (ref 3.5–5.1)
Sodium: 138 mmol/L (ref 135–145)

## 2019-10-13 LAB — CBC
HCT: 32.9 % — ABNORMAL LOW (ref 39.0–52.0)
Hemoglobin: 11.2 g/dL — ABNORMAL LOW (ref 13.0–17.0)
MCH: 30.9 pg (ref 26.0–34.0)
MCHC: 34 g/dL (ref 30.0–36.0)
MCV: 90.9 fL (ref 80.0–100.0)
Platelets: 132 10*3/uL — ABNORMAL LOW (ref 150–400)
RBC: 3.62 MIL/uL — ABNORMAL LOW (ref 4.22–5.81)
RDW: 15.1 % (ref 11.5–15.5)
WBC: 7.9 10*3/uL (ref 4.0–10.5)
nRBC: 0 % (ref 0.0–0.2)

## 2019-10-13 MED ORDER — OXYCODONE HCL 10 MG PO TABS
10.0000 mg | ORAL_TABLET | ORAL | 0 refills | Status: DC | PRN
Start: 2019-10-13 — End: 2020-08-30

## 2019-10-13 MED ORDER — DILTIAZEM HCL ER COATED BEADS 300 MG PO CP24
420.0000 mg | ORAL_CAPSULE | Freq: Every day | ORAL | Status: DC
  Filled 2019-10-13: qty 1

## 2019-10-13 MED ORDER — TIZANIDINE HCL 4 MG PO TABS: 4.0000 mg | ORAL_TABLET | Freq: Three times a day (TID) | ORAL | 0 refills | Status: DC | PRN

## 2019-10-13 MED ORDER — DOCUSATE SODIUM 100 MG PO CAPS: 100.0000 mg | ORAL_CAPSULE | Freq: Two times a day (BID) | ORAL | 0 refills | Status: DC

## 2019-10-13 MED FILL — Thrombin For Soln 5000 Unit: CUTANEOUS | Qty: 5000 | Status: AC

## 2019-10-13 NOTE — Discharge Summary (Signed)
Physician Discharge Summary     Providing Compassionate, Quality Care - Together   Patient ID: [name] MRN: [id number] DOB/AGE: 1960-04-26 59 y.o.  Admit date: 10/12/2019 Discharge date: 10/13/2019  Admission Diagnoses: Spondylolisthesis of lumbar region  Discharge Diagnoses:  Active Problems:   Spondylolisthesis of lumbar region   Discharged Condition: good  Hospital Course: Patient underwent an L3-4 posterior lateral interbody fusion by Dr. Arnoldo Morale on 10/12/2019. He was admitted to 3C09 following recovery form anesthesia in the PACU. His postoperative course has been uncomplicated. He has worked with both physical and occupational therapies who feel the patient is ready for discharge home. He is ambulating independently and without difficulty. He is tolerating a normal diet. He is not having any bowel or bladder dysfunction. His pain is well-controlled with oral pain medication. He is ready for discharge home.   Consults: rehabilitation medicine  Significant Diagnostic Studies: radiology: DG Lumbar Spine 2-3 Views  Result Date: 10/12/2019 CLINICAL DATA:  L3-4 posterior fusion EXAM: LUMBAR SPINE - 2-3 VIEW; DG C-ARM 1-60 MIN COMPARISON:  None. FINDINGS: Intraoperative imaging demonstrates posterior fusion changes at L3-4. No hardware complicating feature. IMPRESSION: Intraoperative imaging as above. Electronically Signed   By: Rolm Baptise M.D.   On: 10/12/2019 11:38   DG Lumbar Spine 1 View  Result Date: 10/12/2019 CLINICAL DATA:  Surgical posterior fusion of L3-4. EXAM: LUMBAR SPINE - 1 VIEW COMPARISON:  August 17, 2019. FINDINGS: Single intraoperative cross-table lateral projection of the lumbar spine demonstrates a surgical probe directed toward the posterior margin of the L3-4 disc space. IMPRESSION: Surgical probe directed toward posterior margin of L3-4 disc space. Electronically Signed   By: Marijo Conception M.D.   On: 10/12/2019 10:50   DG C-Arm 1-60 Min  Result  Date: 10/12/2019 CLINICAL DATA:  L3-4 posterior fusion EXAM: LUMBAR SPINE - 2-3 VIEW; DG C-ARM 1-60 MIN COMPARISON:  None. FINDINGS: Intraoperative imaging demonstrates posterior fusion changes at L3-4. No hardware complicating feature. IMPRESSION: Intraoperative imaging as above. Electronically Signed   By: Rolm Baptise M.D.   On: 10/12/2019 11:38     Treatments: surgery: Bilateral redo L3-4 laminotomy/foraminotomies/medial facetectomy to decompress the bilateral L3 and L4 nerve roots(the work required to do this was in addition to the work required to do the posterior lumbar interbody fusion because of the patient's recurrent spinal stenosis, facet arthropathy. Etc. requiring a wide decompression of the nerve roots.);  L3-4 transforaminal lumbar interbody fusion with local morselized autograft bone and Zimmer DBM; insertion of interbody prosthesis at L3-4 (globus peek expandable interbody prosthesis); posterior nonsegmental instrumentation from L3 to L4 with globus titanium pedicle screws and rods; posterior lateral arthrodesis at L3-4 with local morselized autograft bone and Zimmer DBM.  Discharge Exam: Blood pressure 108/71, pulse 85, temperature 98.7 F (37.1 C), temperature source Oral, resp. rate 18, height 5\' 9"  (1.753 m), weight 89.6 kg, SpO2 96 %.   Alert and oriented x 4 PERRLA CN II-XII grossly intact MAE, Strength and sensation intact Incision is covered with Honeycomb dressing and Steri Strips; Dressing is clean, dry, and intact   Disposition: Discharge disposition: 01-Home or Self Care        Allergies as of 10/13/2019      Reactions   Latex Rash, Other (See Comments)   Redness       Medication List    STOP taking these medications   HYDROcodone-acetaminophen 10-325 MG tablet Commonly known as: NORCO   naproxen 500 MG tablet Commonly known as: NAPROSYN  sulfamethoxazole-trimethoprim 800-160 MG tablet Commonly known as: BACTRIM DS     TAKE these medications     acetaminophen 325 MG tablet Commonly known as: TYLENOL Take 2 tablets (650 mg total) by mouth every 4 (four) hours as needed for mild pain or headache.   ALPRAZolam 0.5 MG tablet Commonly known as: XANAX Take 0.5 mg by mouth in the morning and at bedtime.   amLODipine 5 MG tablet Commonly known as: NORVASC Take 5 mg by mouth daily.   aspirin EC 81 MG tablet Take 1 tablet (81 mg total) by mouth daily with breakfast.   atorvastatin 20 MG tablet Commonly known as: LIPITOR Take 20 mg by mouth daily.   buPROPion 100 MG tablet Commonly known as: WELLBUTRIN Take 100 mg by mouth 2 (two) times daily.   diltiazem 420 MG 24 hr capsule Commonly known as: TIAZAC Take 420 mg by mouth daily.   docusate sodium 100 MG capsule Commonly known as: COLACE Take 1 capsule (100 mg total) by mouth 2 (two) times daily.   gabapentin 800 MG tablet Commonly known as: NEURONTIN Take 800 mg by mouth 3 (three) times daily.   insulin aspart 100 UNIT/ML FlexPen Commonly known as: NOVOLOG Inject 0-10 Units into the skin 3 (three) times daily with meals. insulin aspart (novoLOG) injection 0-10 Units 0-10 Units Subcutaneous, 3 times daily with meals CBG < 70: Implement Hypoglycemia Standing Orders and refer to Hypoglycemia Standing Orders sidebar report  CBG 70 - 120: 0 unit CBG 121 - 150: 0 unit  CBG 151 - 200: 1 unit CBG 201 - 250: 2 units CBG 251 - 300: 4 units CBG 301 - 350: 6 units  CBG 351 - 400: 8 units  CBG > 400: 10 units   Januvia 100 MG tablet Generic drug: sitaGLIPtin Take 100 mg by mouth daily.   Lantus SoloStar 100 UNIT/ML Solostar Pen Generic drug: insulin glargine Inject 30 Units into the skin at bedtime.   metFORMIN 500 MG tablet Commonly known as: GLUCOPHAGE Take 1,000 mg by mouth in the morning and at bedtime. In the morning & 1300   multivitamin with minerals Tabs tablet Take 1 tablet by mouth daily.   Muscle Rub 10-15 % Crea Apply 1 application topically 4 (four) times daily  as needed for muscle pain.   Oxycodone HCl 10 MG Tabs Take 1 tablet (10 mg total) by mouth every 4 (four) hours as needed for severe pain ((score 7 to 10)).   pantoprazole 40 MG tablet Commonly known as: PROTONIX Take 1 tablet (40 mg total) by mouth daily.   polyethylene glycol 17 g packet Commonly known as: MIRALAX / GLYCOLAX Take 17 g by mouth daily as needed for mild constipation.   tiZANidine 4 MG tablet Commonly known as: ZANAFLEX Take 1 tablet (4 mg total) by mouth every 8 (eight) hours as needed for muscle spasms. What changed:   when to take this  reasons to take this       Follow-up Information    Newman Pies, MD. Schedule an appointment as soon as possible for a visit in 2 week(s).   Specialty: Neurosurgery Contact information: [address] [place] [address] [phone]               Signed: Patricia Nettle 10/13/2019, 9:07 AM

## 2019-10-13 NOTE — Discharge Instructions (Signed)
Wound Care Keep incision covered and dry for two days.    Do not put any creams, lotions, or ointments on incision. Leave steri-strips on back.  They will fall off by themselves. Activity Walk each and every day, increasing distance each day. No lifting greater than 5 lbs.  No driving for 2 weeks; may ride as a passenger locally.  Diet Resume your normal diet.  Return to Work Will be discussed at your follow up appointment. Call Your Doctor If Any of These Occur Redness, drainage, or swelling at the wound.  Temperature greater than 101 degrees. Severe pain not relieved by pain medication. Incision starts to come apart. Follow Up Appt Call today for appointment in 1-2 weeks ([phone]) or for problems.  If you have any hardware placed in your spine, you will need an x-ray before your appointment.  

## 2019-10-13 NOTE — Evaluation (Signed)
Physical Therapy Evaluation Patient Details Name: [name] MRN: [id number] DOB: [birth year] Today's Date: 10/13/2019   History of Present Illness  The pt is a 59 yo male presenting s/p bilateral redo of L3-4 laminectomy and PLIF. PMH includes: previous lumbar laminectomy, DM, HTN, and kidney stones.    Clinical Impression  Pt in bed upon arrival of PT, agreeable to evaluation at this time. Prior to admission the pt was independent with use of a SPC for mobility, completing ADLs independently, but relying on family for care of home and animals. The pt now presents with minor limitations in functional mobility, strength, dynamic stability, and activity tolerance due to above dx, and will continue to benefit from skilled PT to address these deficits, but is safe to return home with intermittent assist from family. The pt was able to complete ambulation with use of a SPC today, but had multiple minor LOB, and was therefore encouraged to use his RW for a few days at home for improved safety. The pt does not have any immediate needs for follow-up PT, but may benefit from skilled PT in OP setting when cleared for mobility to work on strengthening and body mechanics to facilitate return to tasks such as being able to feed his chickens and play with his grandchildren.      Follow Up Recommendations Supervision - Intermittent;Other (comment) (OPPT when cleared by MD)    Equipment Recommendations  3in1 (PT)    Recommendations for Other Services       Precautions / Restrictions Precautions Precautions: Back;Fall Precaution Booklet Issued: Yes (comment) Required Braces or Orthoses: Spinal Brace Spinal Brace: Lumbar corset;Applied in sitting position Restrictions Weight Bearing Restrictions: No      Mobility  Bed Mobility Overal bed mobility: Needs Assistance Bed Mobility: Rolling;Sidelying to Sit Rolling: Supervision Sidelying to sit: Supervision       General bed mobility  comments: VC for log roll, use of bed rails  Transfers Overall transfer level: Needs assistance Equipment used: Straight cane Transfers: Sit to/from Stand Sit to Stand: Supervision         General transfer comment: supervision with slow rise to full stand, but no assist needed.  Ambulation/Gait Ambulation/Gait assistance: Supervision Gait Distance (Feet): 250 Feet Assistive device: Straight cane Gait Pattern/deviations: Step-through pattern;Decreased stride length;Trunk flexed;Drifts right/left   Gait velocity interpretation: <1.31 ft/sec, indicative of household ambulator General Gait Details: the pt was able to demo decent stability with SPC during hallway ambulation, but was limited in stride length, speed, and had x3 instances of minor LOB to L or R from which he recovered without assist. discussed use of RW for improved stability at home      Balance Overall balance assessment: Mild deficits observed, not formally tested                                           Pertinent Vitals/Pain Pain Assessment: Faces Faces Pain Scale: Hurts little more Pain Location: low back Pain Descriptors / Indicators: Aching;Sore;Grimacing Pain Intervention(s): Limited activity within patient's tolerance;Monitored during session    Home Living Family/patient expects to be discharged to:: Private residence Living Arrangements: Alone Available Help at Discharge: Available PRN/intermittently;Family (daughter and her boyfriend live nearby and visit frequently to assist) Type of Home: House Home Access: Level entry     Home Layout: One level Home Equipment: Environmental consultant - 2 wheels;Cane - single point  Prior Function Level of Independence: Needs assistance   Gait / Transfers Assistance Needed: independent with use of cane, limited by pain  ADL's / Homemaking Assistance Needed: pt able to complete ADLs independently, but relies on family for home care, driving, and care of  animals (chickens and dogs)  Comments: Pt reports being independent with bathing and dressing, uses cane in the yard to alleviate back pain, drives, has a chicken coop, hasn't worked in over a year. Pt reports his daughter and her boyfriend complete cooking, cleaning, yard work and [employer]. Pt reports had back surgery and has multiple pinched nerves which limit his mobility.     Hand Dominance        Extremity/Trunk Assessment   Upper Extremity Assessment Upper Extremity Assessment: Defer to OT evaluation    Lower Extremity Assessment Lower Extremity Assessment: Overall WFL for tasks assessed (reports bilateral neuropathy at baseline, no perceptible differences in sensation or strength)    Cervical / Trunk Assessment Cervical / Trunk Assessment:  (s/p lumbar surgery)  Communication   Communication: No difficulties  Cognition Arousal/Alertness: Awake/alert Behavior During Therapy: WFL for tasks assessed/performed Overall Cognitive Status: Within Functional Limits for tasks assessed                                 General Comments: initially lethargic, but woke up as session continued. able to recall 3/3 precautions by end of session             Assessment/Plan    PT Assessment Patient needs continued PT services  PT Problem List Decreased strength;Decreased activity tolerance;Decreased balance;Pain       PT Treatment Interventions DME instruction;Therapeutic exercise;Gait training;Balance training;Functional mobility training;Therapeutic activities;Patient/family education    PT Goals (Current goals can be found in the Care Plan section)  Acute Rehab PT Goals Patient Stated Goal: to be able to feed his animals and play with his grandson PT Goal Formulation: With patient Time For Goal Achievement: 10/27/19 Potential to Achieve Goals: Good    Frequency Min 5X/week   Barriers to discharge           AM-PAC PT "6 Clicks" Mobility  Outcome  Measure Help needed turning from your back to your side while in a flat bed without using bedrails?: A Little Help needed moving from lying on your back to sitting on the side of a flat bed without using bedrails?: A Little Help needed moving to and from a bed to a chair (including a wheelchair)?: A Little Help needed standing up from a chair using your arms (e.g., wheelchair or bedside chair)?: None Help needed to walk in hospital room?: A Little Help needed climbing 3-5 steps with a railing? : A Little 6 Click Score: 19    End of Session Equipment Utilized During Treatment: Back brace;Gait belt Activity Tolerance: Patient limited by pain;Patient tolerated treatment well Patient left: in bed;with call bell/phone within reach (sitting EOB, OT arriving upon PT departure) Nurse Communication: Mobility status PT Visit Diagnosis: Muscle weakness (generalized) (M62.81);Difficulty in walking, not elsewhere classified (R26.2);Pain Pain - Right/Left: Right Pain - part of body:  (back)    Time: 3825-0539 PT Time Calculation (min) (ACUTE ONLY): 29 min   Charges:   PT Evaluation $PT Eval Moderate Complexity: 1 Mod PT Treatments $Gait Training: 8-22 mins        Karma Ganja, PT, DPT   Acute Rehabilitation Department Pager #: [phone] -  [phone]  Otho Bellows 10/13/2019, 9:29 AM

## 2019-10-13 NOTE — Plan of Care (Signed)
Pt given D/C instructions with verbal understanding. Rx's were sent to the pharmacy by MD. Pt's incision is clean and dry with no sign of infection. Pt's IV was removed prior to D/C. Pt refused 3-n-1 for home use. Pt D/C'd home via wheelchair per MD order. Pt is stable @ D/C and has no other needs at this time. Holli Humbles, RN

## 2019-10-13 NOTE — Evaluation (Signed)
Occupational Therapy Evaluation Patient Details Name: [name] MRN: [id number] DOB: 03-07-1960 Today's Date: 10/13/2019    History of Present Illness The pt is a 59 yo male presenting s/p bilateral redo of L3-4 laminectomy and PLIF. PMH includes: previous lumbar laminectomy, DM, HTN, and kidney stones.   Clinical Impression   Pt admitted with the above diagnoses and presents with below problem list. Pt will benefit from continued acute OT to address the below listed deficits and maximize independence with basic ADLs prior to d/c home. At baseline, pt is mod I with ADLs. Pt is currently mod I to supervision with ADLs. All ADL and back precautions education completed including strategies, DME and AE recommendations. No further acute OT needs indicated at this time. OT signing off.      Follow Up Recommendations  Supervision - Intermittent    Equipment Recommendations  3 in 1 bedside commode    Recommendations for Other Services       Precautions / Restrictions Precautions Precautions: Back;Fall Precaution Booklet Issued: Yes (comment) Required Braces or Orthoses: Spinal Brace Spinal Brace: Lumbar corset;Applied in sitting position Restrictions Weight Bearing Restrictions: No      Mobility Bed Mobility Overal bed mobility: Needs Assistance Bed Mobility: Rolling;Sidelying to Sit Rolling: Supervision Sidelying to sit: Supervision       General bed mobility comments: sitting EOB  Transfers Overall transfer level: Needs assistance Equipment used: Straight cane Transfers: Sit to/from Stand Sit to Stand: Supervision         General transfer comment: cues for upright posture. encouraged pt to use at least cane to stand to limit trunk flexion while coming to stand    Balance Overall balance assessment: Mild deficits observed, not formally tested                                         ADL either performed or assessed with clinical judgement    ADL Overall ADL's : Needs assistance/impaired Eating/Feeding: Set up;Sitting   Grooming: Set up;Supervision/safety;Sitting;Standing   Upper Body Bathing: Set up;Sitting   Lower Body Bathing: Modified independent;Sit to/from stand;With adaptive equipment   Upper Body Dressing : Set up;Sitting   Lower Body Dressing: Modified independent;With adaptive equipment;Sit to/from stand   Toilet Transfer: Supervision/safety   Toileting- Water quality scientist and Hygiene: Set up;Supervision/safety;Sit to/from stand Toileting - Clothing Manipulation Details (indicate cue type and reason): discussed AE option if needed Tub/ Shower Transfer: Tub transfer;Min guard;3 in 1;Rolling walker (vs cane)   Functional mobility during ADLs: Supervision/safety;Cane General ADL Comments: Back precautions and ADL strategies reviewed with pt. Issued LB AE kit and instructed in use.      Vision         Perception     Praxis      Pertinent Vitals/Pain Pain Assessment: 0-10 Faces Pain Scale: Hurts worst Pain Location: low back Pain Descriptors / Indicators: Stabbing;Grimacing;Guarding Pain Intervention(s): Limited activity within patient's tolerance;Monitored during session;Repositioned     Hand Dominance     Extremity/Trunk Assessment Upper Extremity Assessment Upper Extremity Assessment: Overall WFL for tasks assessed   Lower Extremity Assessment Lower Extremity Assessment: Defer to PT evaluation   Cervical / Trunk Assessment Cervical / Trunk Assessment:  (s/p lumbar surgery)   Communication Communication Communication: No difficulties   Cognition Arousal/Alertness: Awake/alert Behavior During Therapy: WFL for tasks assessed/performed Overall Cognitive Status: Within Functional Limits for tasks assessed  General Comments: initially lethargic, but woke up as session continued. able to recall 3/3 precautions by end of session   General  Comments       Exercises     Shoulder Instructions      Home Living Family/patient expects to be discharged to:: Private residence Living Arrangements: Alone Available Help at Discharge: Available PRN/intermittently;Family Type of Home: House Home Access: Level entry     Home Layout: One level     Bathroom Shower/Tub: Teacher, early years/pre: Standard     Home Equipment: Environmental consultant - 2 wheels;Cane - single point          Prior Functioning/Environment Level of Independence: Needs assistance  Gait / Transfers Assistance Needed: independent with use of cane, limited by pain ADL's / Homemaking Assistance Needed: pt able to complete ADLs independently, but relies on family for home care, driving, and care of animals (chickens and dogs)   Comments: Pt reports being independent with bathing and dressing, uses cane in the yard to alleviate back pain, drives, has a chicken coop, hasn't worked in over a year. Pt reports his daughter and her boyfriend complete cooking, cleaning, yard work and [employer]. Pt reports had back surgery and has multiple pinched nerves which limit his mobility.        OT Problem List: Impaired balance (sitting and/or standing);Decreased knowledge of use of DME or AE;Decreased knowledge of precautions;Pain      OT Treatment/Interventions:      OT Goals(Current goals can be found in the care plan section) Acute Rehab OT Goals Patient Stated Goal: to be able to feed his animals and play with his grandson  OT Frequency:     Barriers to D/C:            Co-evaluation              AM-PAC OT "6 Clicks" Daily Activity     Outcome Measure Help from another person eating meals?: None Help from another person taking care of personal grooming?: None Help from another person toileting, which includes using toliet, bedpan, or urinal?: None Help from another person bathing (including washing, rinsing, drying)?: None Help from another  person to put on and taking off regular upper body clothing?: None Help from another person to put on and taking off regular lower body clothing?: None 6 Click Score: 24   End of Session Equipment Utilized During Treatment: Other (comment);Back brace (cane)  Activity Tolerance: Patient limited by pain;Patient tolerated treatment well Patient left: with call bell/phone within reach;Other (comment) (sitting EOB)  OT Visit Diagnosis: Unsteadiness on feet (R26.81);Pain                Time: 0902-0926 OT Time Calculation (min): 24 min Charges:  OT General Charges $OT Visit: 1 Visit OT Evaluation $OT Eval Low Complexity: 1 Low OT Treatments $Self Care/Home Management : 8-22 mins  Tyrone Schimke, OT Acute Rehabilitation Services Pager: [phone] Office: [phone]   Hortencia Pilar 10/13/2019, 10:02 AM

## 2019-10-14 MED FILL — Sodium Chloride IV Soln 0.9%: INTRAVENOUS | Qty: 1000 | Status: AC

## 2019-10-14 MED FILL — Heparin Sodium (Porcine) Inj 1000 Unit/ML: INTRAMUSCULAR | Qty: 30 | Status: AC

## 2019-10-17 ENCOUNTER — Other Ambulatory Visit (HOSPITAL_COMMUNITY): Payer: Medicaid Other

## 2019-10-27 ENCOUNTER — Ambulatory Visit: Payer: Medicaid Other | Admitting: General Surgery

## 2019-10-27 ENCOUNTER — Other Ambulatory Visit (HOSPITAL_COMMUNITY): Payer: Self-pay | Admitting: Internal Medicine

## 2019-10-27 DIAGNOSIS — M869 Osteomyelitis, unspecified: Secondary | ICD-10-CM

## 2019-11-01 ENCOUNTER — Other Ambulatory Visit: Payer: Self-pay

## 2019-11-01 ENCOUNTER — Ambulatory Visit (INDEPENDENT_AMBULATORY_CARE_PROVIDER_SITE_OTHER): Payer: Medicaid Other | Admitting: General Surgery

## 2019-11-01 ENCOUNTER — Encounter: Payer: Self-pay | Admitting: General Surgery

## 2019-11-01 VITALS — BP 118/75 | HR 99 | Temp 98.4°F | Resp 14 | Ht 69.0 in | Wt 204.0 lb

## 2019-11-01 DIAGNOSIS — L03032 Cellulitis of left toe: Secondary | ICD-10-CM | POA: Diagnosis not present

## 2019-11-01 NOTE — Progress Notes (Signed)
Subjective:     [name]  Patient presents for evaluation treatment of left second toe cellulitis.  He states he started having swelling and redness in the second toe and was started on antibiotics by his primary care physician.  He recently underwent back surgery in [place].  He states the toe swelling has improved.  He has not had any drainage from the toe.  He did lose his nail on the toe.  States his blood sugars have been actually running low. Objective:    BP 118/75    Pulse 99    Temp 98.4 F (36.9 C) (Oral)    Resp 14    Ht 5\' 9"  (1.753 m)    Wt 204 lb (92.5 kg)    SpO2 93%    BMI 30.13 kg/m   General:  alert, cooperative and no distress  Left foot with palpable dorsalis pedis and posterior tibial pulses.  Left second toe with erythema and loss of the nail bed on the tip.  No drainage is noted.  The erythema only involves the toe to the base.  No other evidence of cyanosis or cellulitis of the foot or other toes. Media Information   Document Information  Photos    11/01/2019 13:08  Attached To:  Office Visit on 11/01/19 with Aviva Signs, MD  Source Information  Aviva Signs, MD   [facility]        Assessment:    Diabetes mellitus, cellulitis of left second toe, resolving    Plan:   Told the patient to stop soaking the foot.  Just keep foot clean and dry with soap and water.  Follow-up here in 2 weeks for wound check.

## 2019-11-15 ENCOUNTER — Other Ambulatory Visit: Payer: Self-pay

## 2019-11-15 ENCOUNTER — Ambulatory Visit (INDEPENDENT_AMBULATORY_CARE_PROVIDER_SITE_OTHER): Payer: Medicaid Other | Admitting: General Surgery

## 2019-11-15 ENCOUNTER — Encounter: Payer: Self-pay | Admitting: General Surgery

## 2019-11-15 VITALS — BP 114/77 | HR 95 | Temp 98.1°F | Resp 16 | Ht 69.0 in | Wt 201.0 lb

## 2019-11-15 DIAGNOSIS — L03032 Cellulitis of left toe: Secondary | ICD-10-CM

## 2019-11-15 MED ORDER — DOXYCYCLINE HYCLATE 50 MG PO CAPS: 100.0000 mg | ORAL_CAPSULE | Freq: Two times a day (BID) | ORAL | 0 refills | Status: DC

## 2019-11-15 NOTE — Progress Notes (Signed)
Subjective:     [name]  Here for follow-up of cellulitis of the left second toe.  Patient states that it is recently started to swell back up and is turned more red.  No draining wounds are noted.  He is finishing a course of Bactrim. Objective:    BP 114/77   Pulse 95   Temp 98.1 F (36.7 C) (Oral)   Resp 16   Ht 5\' 9"  (1.753 m)   Wt 201 lb (91.2 kg)   SpO2 94%   BMI 29.68 kg/m   General:  alert, cooperative and no distress  Left second toe is more erythematous and swollen than when I saw her before.  This extends down to the base.  Palpable pulses are present.  No frank ulcerations are present. Media Information   Document Information  Photos    11/15/2019 12:57  Attached To:  Office Visit on 11/15/19 with Aviva Signs, MD  Source Information  Aviva Signs, MD  [facility]         Assessment:    Cellulitis of left second toe.  Concerned that there may be progression of the cellulitis.  Patient states he is recovering well from his recent back surgery.    Plan:   We will switch his antibiotics to Vibramycin.  We will see him again in 2 weeks.  I am worried that he may end up needing an amputation of this digit.  Patient is aware.

## 2019-11-29 ENCOUNTER — Ambulatory Visit (INDEPENDENT_AMBULATORY_CARE_PROVIDER_SITE_OTHER): Payer: Medicaid Other | Admitting: General Surgery

## 2019-11-29 ENCOUNTER — Other Ambulatory Visit: Payer: Self-pay

## 2019-11-29 ENCOUNTER — Encounter: Payer: Self-pay | Admitting: General Surgery

## 2019-11-29 VITALS — BP 123/80 | HR 107 | Temp 98.5°F | Resp 16 | Ht 69.0 in | Wt 200.0 lb

## 2019-11-29 DIAGNOSIS — L03032 Cellulitis of left toe: Secondary | ICD-10-CM | POA: Diagnosis not present

## 2019-11-29 MED ORDER — DOXYCYCLINE HYCLATE 50 MG PO CAPS: 100.0000 mg | ORAL_CAPSULE | Freq: Two times a day (BID) | ORAL | 0 refills | Status: DC

## 2019-11-29 NOTE — Progress Notes (Signed)
Subjective:     [name]  Patient here for follow-up of left second toe cellulitis.  Patient states he removed his toenail yesterday evening and had purulent drainage present.  This did relieve some of the pain and pressure in the left second toe.  He only has 1 more day of antibiotics. Objective:    BP 123/80   Pulse (!) 107   Temp 98.5 F (36.9 C) (Oral)   Resp 16   Ht 5\' 9"  (1.753 m)   Wt 200 lb (90.7 kg)   SpO2 96%   BMI 29.53 kg/m   General:  alert, cooperative and no distress  Left second toe still swollen and erythematous, though not significantly changed since last visit.  The toenail has been removed.  Palpable dorsalis pedis and posterior tibial pulses noted.  He does not have a sending cellulitis into the foot or pretibial region.     Assessment:    Left second toe cellulitis, stable.  Should improve now that he has had release of the purulent drainage from the toenail area.    Plan:   Will reorder doxycycline for 1 more week.  Follow-up here in 2 weeks.  Sooner as needed.

## 2019-12-13 ENCOUNTER — Encounter: Payer: Self-pay | Admitting: General Surgery

## 2019-12-13 ENCOUNTER — Ambulatory Visit (INDEPENDENT_AMBULATORY_CARE_PROVIDER_SITE_OTHER): Payer: Medicaid Other | Admitting: General Surgery

## 2019-12-13 ENCOUNTER — Other Ambulatory Visit: Payer: Self-pay

## 2019-12-13 VITALS — BP 140/88 | HR 98 | Temp 98.1°F | Resp 14 | Ht 69.0 in | Wt 205.0 lb

## 2019-12-13 DIAGNOSIS — L02612 Cutaneous abscess of left foot: Secondary | ICD-10-CM | POA: Diagnosis not present

## 2019-12-13 DIAGNOSIS — L03032 Cellulitis of left toe: Secondary | ICD-10-CM | POA: Diagnosis not present

## 2019-12-13 NOTE — Progress Notes (Signed)
Subjective:     [name]  Here for follow-up of cellulitis of left second toe.  Patient states he is still soaking it and does get some drainage from it.  It is improved since I last saw him.  He denies any fevers. Objective:    BP 140/88   Pulse 98   Temp 98.1 F (36.7 C) (Oral)   Resp 14   Ht 5\' 9"  (1.753 m)   Wt 205 lb (93 kg)   SpO2 95%   BMI 30.27 kg/m   General:  alert, cooperative and no distress  Left second toe slightly less swollen.  No ascending cellulitis noted.  Pulses intact.  The nailbed is not draining any fluid at the present time.  It appears the toe is demarcating.     Assessment:    Left second toe cellulitis resolving.  I told the patient that there is demarcation and the toe may never return totally to normal.  There is no urgent need to do an amputation at the present time.  He understands and agrees.    Plan:   Continue current wound care.  Follow-up as needed.

## 2020-01-03 ENCOUNTER — Ambulatory Visit: Payer: Medicaid Other | Admitting: Cardiology

## 2020-01-03 NOTE — Progress Notes (Deleted)
Clinical Summary Mr. Mikesell is a 59 y.o.male  1. Chest pain - ER visit 02/2019 with chest pain - hstrop neg x 2. CXR no acute process. EKG SR, no acute ischemic changes CAD risk factors: DM2, HTN,    - chest pain started that day. Midchest/epigastric burning, pressure chest and to back in between shoulder blades. Started while walking in yard. Felt hot and sweaty, mild SOB. Not positional. 10/10 in severity Waited 4 hours with constant pain, came ER - in ER reports NG made it better, though also got mylanta - symptoms still ongoing.  - no relation to food.   - remote cath 9 years ago was normal   2. DM2 - followed by pcp - has not been on statin Past Medical History:  Diagnosis Date  . Arthritis   . Diabetes mellitus   . GERD (gastroesophageal reflux disease)   . Headache(784.0)   . History of kidney stones   . Hypertension   . Kidney stones      Allergies  Allergen Reactions  . Latex Rash and Other (See Comments)    Redness      Current Outpatient Medications  Medication Sig Dispense Refill  . acetaminophen (TYLENOL) 325 MG tablet Take 2 tablets (650 mg total) by mouth every 4 (four) hours as needed for mild pain or headache. 100 tablet 0  . ALPRAZolam (XANAX) 0.5 MG tablet Take 0.5 mg by mouth in the morning and at bedtime.   2  . amLODipine (NORVASC) 5 MG tablet Take 5 mg by mouth daily.    Marland Kitchen aspirin EC 81 MG tablet Take 1 tablet (81 mg total) by mouth daily with breakfast. 120 tablet 3  . atorvastatin (LIPITOR) 20 MG tablet Take 20 mg by mouth daily.    Marland Kitchen buPROPion (WELLBUTRIN) 100 MG tablet Take 100 mg by mouth 2 (two) times daily.    Marland Kitchen diltiazem (TIAZAC) 420 MG 24 hr capsule Take 420 mg by mouth daily.    Marland Kitchen docusate sodium (COLACE) 100 MG capsule Take 1 capsule (100 mg total) by mouth 2 (two) times daily. 10 capsule 0  . doxycycline (VIBRAMYCIN) 50 MG capsule Take 2 capsules (100 mg total) by mouth 2 (two) times daily. 28 capsule 0  .  furosemide (LASIX) 20 MG tablet Take 20 mg by mouth daily.    Marland Kitchen gabapentin (NEURONTIN) 800 MG tablet Take 800 mg by mouth 3 (three) times daily.    . insulin aspart (NOVOLOG) 100 UNIT/ML FlexPen Inject 0-10 Units into the skin 3 (three) times daily with meals. insulin aspart (novoLOG) injection 0-10 Units 0-10 Units Subcutaneous, 3 times daily with meals CBG < 70: Implement Hypoglycemia Standing Orders and refer to Hypoglycemia Standing Orders sidebar report  CBG 70 - 120: 0 unit CBG 121 - 150: 0 unit  CBG 151 - 200: 1 unit CBG 201 - 250: 2 units CBG 251 - 300: 4 units CBG 301 - 350: 6 units  CBG 351 - 400: 8 units  CBG > 400: 10 units 15 mL 11  . JANUVIA 100 MG tablet Take 100 mg by mouth daily.    Marland Kitchen LANTUS SOLOSTAR 100 UNIT/ML Solostar Pen Inject 30 Units into the skin at bedtime.    . Menthol-Methyl Salicylate (MUSCLE RUB) 10-15 % CREA Apply 1 application topically 4 (four) times daily as needed for muscle pain.    . metFORMIN (GLUCOPHAGE) 500 MG tablet Take 1,000 mg by mouth in the morning and at bedtime. In  the morning & 1300    . Multiple Vitamin (MULTIVITAMIN WITH MINERALS) TABS tablet Take 1 tablet by mouth daily.    Marland Kitchen oxyCODONE 10 MG TABS Take 1 tablet (10 mg total) by mouth every 4 (four) hours as needed for severe pain ((score 7 to 10)). 30 tablet 0  . pantoprazole (PROTONIX) 40 MG tablet Take 1 tablet (40 mg total) by mouth daily. 30 tablet 11  . polyethylene glycol (MIRALAX / GLYCOLAX) 17 g packet Take 17 g by mouth daily as needed for mild constipation. 14 each 0  . tiZANidine (ZANAFLEX) 4 MG tablet Take 1 tablet (4 mg total) by mouth every 8 (eight) hours as needed for muscle spasms. 30 tablet 0   No current facility-administered medications for this visit.     Past Surgical History:  Procedure Laterality Date  . AMPUTATION TOE Right 06/21/2019   Procedure: AMPUTATION TOE;  Surgeon: Aviva Signs, MD;  Location: [facility];  Service: General;  Laterality: Right;  . CARDIAC  CATHETERIZATION  2009  . CHOLECYSTECTOMY N/A 10/24/2013   Procedure: LAPAROSCOPIC CHOLECYSTECTOMY;  Surgeon: Jamesetta So, MD;  Location: [facility];  Service: General;  Laterality: N/A;  . COLONOSCOPY N/A 03/31/2013   Procedure: COLONOSCOPY;  Surgeon: Rogene Houston, MD;  Location: [facility];  Service: Endoscopy;  Laterality: N/A;  730     Allergies  Allergen Reactions  . Latex Rash and Other (See Comments)    Redness       Family History  Problem Relation Age of Onset  . Diabetes Mother   . Heart failure Mother   . Heart disease Mother   . Heart failure Father      Social History Mr. Vandenberghe reports that he has quit smoking. His smoking use included cigarettes. He has never used smokeless tobacco. Mr. Salmons reports no history of alcohol use.   Review of Systems CONSTITUTIONAL: No weight loss, fever, chills, weakness or fatigue.  HEENT: Eyes: No visual loss, blurred vision, double vision or yellow sclerae.No hearing loss, sneezing, congestion, runny nose or sore throat.  SKIN: No rash or itching.  CARDIOVASCULAR:  RESPIRATORY: No shortness of breath, cough or sputum.  GASTROINTESTINAL: No anorexia, nausea, vomiting or diarrhea. No abdominal pain or blood.  GENITOURINARY: No burning on urination, no polyuria NEUROLOGICAL: No headache, dizziness, syncope, paralysis, ataxia, numbness or tingling in the extremities. No change in bowel or bladder control.  MUSCULOSKELETAL: No muscle, back pain, joint pain or stiffness.  LYMPHATICS: No enlarged nodes. No history of splenectomy.  PSYCHIATRIC: No history of depression or anxiety.  ENDOCRINOLOGIC: No reports of sweating, cold or heat intolerance. No polyuria or polydipsia.  Marland Kitchen   Physical Examination There were no vitals filed for this visit. There were no vitals filed for this visit.  Gen: resting comfortably, no acute distress HEENT: no scleral icterus, pupils equal round and reactive, no palptable cervical  adenopathy,  CV Resp: Clear to auscultation bilaterally GI: abdomen is soft, non-tender, non-distended, normal bowel sounds, no hepatosplenomegaly MSK: extremities are warm, no edema.  Skin: warm, no rash Neuro:  no focal deficits Psych: appropriate affect   Diagnostic Studies     Assessment and Plan   1. Chest pain/epigastric pain - 72 hrs of constant midchest/epigastric pain described as burning/pressure. Symptoms not consistent with cardiac chest pain, ER evaluation was benign for ACS. Tender in epigastric area to palpation, all would suggest GI etiology - d/c prilosec, start protonix 40mg  daily. Refer to GI for evaluation - bp's  equal in both arms, symptoms not consiistent with disections.   2. HTN - above goal, start chlorthaldone 12.5mg  daily - bmet/mg in 2 weeks, nursing visit for bp check in 2 weeks  3. DM2 - would consider at least moderate strength statin given his DM2 history     Arnoldo Lenis, M.D., F.A.C.C.

## 2020-01-21 HISTORY — PX: BACK SURGERY: SHX140

## 2020-03-26 ENCOUNTER — Other Ambulatory Visit: Payer: Self-pay | Admitting: Cardiology

## 2020-04-29 ENCOUNTER — Other Ambulatory Visit: Payer: Self-pay | Admitting: Cardiology

## 2020-04-30 ENCOUNTER — Other Ambulatory Visit: Payer: Self-pay | Admitting: Cardiology

## 2020-05-05 ENCOUNTER — Other Ambulatory Visit: Payer: Self-pay | Admitting: Cardiology

## 2020-06-02 ENCOUNTER — Other Ambulatory Visit: Payer: Self-pay | Admitting: Cardiology

## 2020-08-27 ENCOUNTER — Inpatient Hospital Stay (HOSPITAL_COMMUNITY)
Admission: EM | Admit: 2020-08-27 | Discharge: 2020-08-30 | DRG: 617 | Disposition: A | Discharge status: Home / Self Care | Payer: Medicare Other | Attending: Internal Medicine | Admitting: Internal Medicine

## 2020-08-27 ENCOUNTER — Other Ambulatory Visit: Payer: Self-pay

## 2020-08-27 ENCOUNTER — Encounter (HOSPITAL_COMMUNITY): Payer: Self-pay

## 2020-08-27 ENCOUNTER — Emergency Department (HOSPITAL_COMMUNITY): Payer: Medicare Other

## 2020-08-27 DIAGNOSIS — Z87891 Personal history of nicotine dependence: Secondary | ICD-10-CM

## 2020-08-27 DIAGNOSIS — Z794 Long term (current) use of insulin: Secondary | ICD-10-CM | POA: Diagnosis not present

## 2020-08-27 DIAGNOSIS — Z20822 Contact with and (suspected) exposure to covid-19: Secondary | ICD-10-CM | POA: Diagnosis present

## 2020-08-27 DIAGNOSIS — I10 Essential (primary) hypertension: Secondary | ICD-10-CM | POA: Diagnosis present

## 2020-08-27 DIAGNOSIS — K219 Gastro-esophageal reflux disease without esophagitis: Secondary | ICD-10-CM | POA: Diagnosis present

## 2020-08-27 DIAGNOSIS — L089 Local infection of the skin and subcutaneous tissue, unspecified: Secondary | ICD-10-CM

## 2020-08-27 DIAGNOSIS — Z833 Family history of diabetes mellitus: Secondary | ICD-10-CM

## 2020-08-27 DIAGNOSIS — Z87442 Personal history of urinary calculi: Secondary | ICD-10-CM

## 2020-08-27 DIAGNOSIS — E1152 Type 2 diabetes mellitus with diabetic peripheral angiopathy with gangrene: Secondary | ICD-10-CM | POA: Diagnosis present

## 2020-08-27 DIAGNOSIS — M7989 Other specified soft tissue disorders: Secondary | ICD-10-CM | POA: Diagnosis present

## 2020-08-27 DIAGNOSIS — E1140 Type 2 diabetes mellitus with diabetic neuropathy, unspecified: Secondary | ICD-10-CM | POA: Diagnosis present

## 2020-08-27 DIAGNOSIS — M8440XA Pathological fracture, unspecified site, initial encounter for fracture: Secondary | ICD-10-CM | POA: Diagnosis present

## 2020-08-27 DIAGNOSIS — E11628 Type 2 diabetes mellitus with other skin complications: Secondary | ICD-10-CM | POA: Diagnosis present

## 2020-08-27 DIAGNOSIS — M868X7 Other osteomyelitis, ankle and foot: Secondary | ICD-10-CM | POA: Diagnosis present

## 2020-08-27 DIAGNOSIS — L03031 Cellulitis of right toe: Secondary | ICD-10-CM | POA: Diagnosis present

## 2020-08-27 DIAGNOSIS — E1169 Type 2 diabetes mellitus with other specified complication: Principal | ICD-10-CM | POA: Diagnosis present

## 2020-08-27 DIAGNOSIS — M84674A Pathological fracture in other disease, right foot, initial encounter for fracture: Secondary | ICD-10-CM | POA: Diagnosis present

## 2020-08-27 DIAGNOSIS — G8929 Other chronic pain: Secondary | ICD-10-CM | POA: Diagnosis present

## 2020-08-27 DIAGNOSIS — Z7982 Long term (current) use of aspirin: Secondary | ICD-10-CM

## 2020-08-27 DIAGNOSIS — M869 Osteomyelitis, unspecified: Secondary | ICD-10-CM | POA: Diagnosis present

## 2020-08-27 DIAGNOSIS — Z8249 Family history of ischemic heart disease and other diseases of the circulatory system: Secondary | ICD-10-CM | POA: Diagnosis not present

## 2020-08-27 DIAGNOSIS — Z9049 Acquired absence of other specified parts of digestive tract: Secondary | ICD-10-CM | POA: Diagnosis not present

## 2020-08-27 DIAGNOSIS — Z91040 Latex allergy status: Secondary | ICD-10-CM | POA: Diagnosis not present

## 2020-08-27 DIAGNOSIS — Z7984 Long term (current) use of oral hypoglycemic drugs: Secondary | ICD-10-CM

## 2020-08-27 DIAGNOSIS — E876 Hypokalemia: Secondary | ICD-10-CM | POA: Diagnosis present

## 2020-08-27 DIAGNOSIS — Z79899 Other long term (current) drug therapy: Secondary | ICD-10-CM | POA: Diagnosis not present

## 2020-08-27 HISTORY — DX: Polyneuropathy, unspecified: G62.9

## 2020-08-27 LAB — BASIC METABOLIC PANEL
Anion gap: 7 (ref 5–15)
BUN: 18 mg/dL (ref 6–20)
CO2: 27 mmol/L (ref 22–32)
Calcium: 9.3 mg/dL (ref 8.9–10.3)
Chloride: 104 mmol/L (ref 98–111)
Creatinine, Ser: 0.84 mg/dL (ref 0.61–1.24)
GFR, Estimated: >60 mL/min (ref >60)
Glucose, Bld: 146 mg/dL — ABNORMAL HIGH (ref 70–99)
Potassium: 3.4 mmol/L — ABNORMAL LOW (ref 3.5–5.1)
Sodium: 138 mmol/L (ref 135–145)

## 2020-08-27 LAB — CBC WITH DIFFERENTIAL/PLATELET
Abs Immature Granulocytes: 0.06 10*3/uL (ref 0.00–0.07)
Basophils Absolute: 0.1 10*3/uL (ref 0.0–0.1)
Basophils Relative: 0 %
Eosinophils Absolute: 0.1 10*3/uL (ref 0.0–0.5)
Eosinophils Relative: 1 %
HCT: 39.3 % (ref 39.0–52.0)
Hemoglobin: 13.5 g/dL (ref 13.0–17.0)
Immature Granulocytes: 0 %
Lymphocytes Relative: 17 %
Lymphs Abs: 2.4 10*3/uL (ref 0.7–4.0)
MCH: 30.5 pg (ref 26.0–34.0)
MCHC: 34.4 g/dL (ref 30.0–36.0)
MCV: 88.9 fL (ref 80.0–100.0)
Monocytes Absolute: 1 10*3/uL (ref 0.1–1.0)
Monocytes Relative: 7 %
Neutro Abs: 10.4 10*3/uL — ABNORMAL HIGH (ref 1.7–7.7)
Neutrophils Relative %: 75 %
Platelets: 263 10*3/uL (ref 150–400)
RBC: 4.42 MIL/uL (ref 4.22–5.81)
RDW: 13.4 % (ref 11.5–15.5)
WBC: 14 10*3/uL — ABNORMAL HIGH (ref 4.0–10.5)
nRBC: 0 % (ref 0.0–0.2)

## 2020-08-27 LAB — HEMOGLOBIN A1C
Hgb A1c MFr Bld: 5.6 % (ref 4.8–5.6)
Mean Plasma Glucose: 114.02 mg/dL

## 2020-08-27 LAB — GLUCOSE, CAPILLARY
Glucose-Capillary: 115 mg/dL — ABNORMAL HIGH (ref 70–99)
Glucose-Capillary: 117 mg/dL — ABNORMAL HIGH (ref 70–99)

## 2020-08-27 LAB — LACTIC ACID, PLASMA: Lactic Acid, Venous: 0.9 mmol/L (ref 0.5–1.9)

## 2020-08-27 MED ORDER — POTASSIUM CHLORIDE CRYS ER 20 MEQ PO TBCR
40.0000 meq | EXTENDED_RELEASE_TABLET | Freq: Once | ORAL | Status: AC
  Administered 2020-08-27: 40 meq via ORAL
  Filled 2020-08-27: qty 2

## 2020-08-27 MED ORDER — ADULT MULTIVITAMIN W/MINERALS CH
1.0000 | ORAL_TABLET | Freq: Every day | ORAL | Status: DC
  Administered 2020-08-27 – 2020-08-30 (×3): 1 via ORAL
  Filled 2020-08-27 (×3): qty 1

## 2020-08-27 MED ORDER — MORPHINE SULFATE (PF) 2 MG/ML IV SOLN
2.0000 mg | INTRAVENOUS | Status: DC | PRN
  Administered 2020-08-27 – 2020-08-30 (×4): 2 mg via INTRAVENOUS
  Filled 2020-08-27 (×5): qty 1

## 2020-08-27 MED ORDER — ATORVASTATIN CALCIUM 40 MG PO TABS
20.0000 mg | ORAL_TABLET | Freq: Every day | ORAL | Status: DC
  Administered 2020-08-27 – 2020-08-30 (×3): 20 mg via ORAL
  Filled 2020-08-27 (×3): qty 1

## 2020-08-27 MED ORDER — OXYCODONE HCL 10 MG PO TABS: 10.0000 mg | ORAL_TABLET | ORAL | Status: DC | PRN

## 2020-08-27 MED ORDER — VANCOMYCIN HCL IN DEXTROSE 1-5 GM/200ML-% IV SOLN
1000.0000 mg | Freq: Once | INTRAVENOUS | Status: AC
  Administered 2020-08-27: 1000 mg via INTRAVENOUS
  Filled 2020-08-27: qty 200

## 2020-08-27 MED ORDER — INSULIN ASPART 100 UNIT/ML IJ SOLN
0.0000 [IU] | Freq: Three times a day (TID) | INTRAMUSCULAR | Status: DC
  Administered 2020-08-28: 5 [IU] via SUBCUTANEOUS
  Administered 2020-08-28: 3 [IU] via SUBCUTANEOUS
  Administered 2020-08-29: 5 [IU] via SUBCUTANEOUS
  Administered 2020-08-30: 3 [IU] via SUBCUTANEOUS

## 2020-08-27 MED ORDER — ENOXAPARIN SODIUM 40 MG/0.4ML IJ SOSY
40.0000 mg | PREFILLED_SYRINGE | INTRAMUSCULAR | Status: DC
  Administered 2020-08-27 – 2020-08-29 (×3): 40 mg via SUBCUTANEOUS
  Filled 2020-08-27 (×3): qty 0.4

## 2020-08-27 MED ORDER — ALPRAZOLAM 0.5 MG PO TABS
0.5000 mg | ORAL_TABLET | Freq: Two times a day (BID) | ORAL | Status: DC
  Administered 2020-08-27 – 2020-08-28 (×2): 0.5 mg via ORAL
  Filled 2020-08-27 (×4): qty 1

## 2020-08-27 MED ORDER — ACETAMINOPHEN 325 MG PO TABS: 650.0000 mg | ORAL_TABLET | ORAL | Status: DC | PRN

## 2020-08-27 MED ORDER — VANCOMYCIN HCL 1250 MG/250ML IV SOLN
1250.0000 mg | Freq: Two times a day (BID) | INTRAVENOUS | Status: DC
  Administered 2020-08-28 – 2020-08-30 (×5): 1250 mg via INTRAVENOUS
  Filled 2020-08-27 (×5): qty 250

## 2020-08-27 MED ORDER — HYDROCODONE-ACETAMINOPHEN 10-325 MG PO TABS
1.0000 | ORAL_TABLET | Freq: Four times a day (QID) | ORAL | Status: DC | PRN
Start: 2020-08-27 — End: 2020-08-30
  Administered 2020-08-27 – 2020-08-30 (×7): 1 via ORAL
  Filled 2020-08-27 (×7): qty 1

## 2020-08-27 MED ORDER — INSULIN GLARGINE-YFGN 100 UNIT/ML ~~LOC~~ SOLN
30.0000 [IU] | Freq: Every day | SUBCUTANEOUS | Status: DC
  Administered 2020-08-27 – 2020-08-29 (×3): 30 [IU] via SUBCUTANEOUS
  Filled 2020-08-27 (×4): qty 0.3

## 2020-08-27 MED ORDER — ONDANSETRON HCL 4 MG/2ML IJ SOLN
4.0000 mg | Freq: Once | INTRAMUSCULAR | Status: AC
  Administered 2020-08-27: 4 mg via INTRAVENOUS
  Filled 2020-08-27: qty 2

## 2020-08-27 MED ORDER — PANTOPRAZOLE SODIUM 40 MG PO TBEC
40.0000 mg | DELAYED_RELEASE_TABLET | Freq: Every day | ORAL | Status: DC
  Administered 2020-08-27 – 2020-08-30 (×3): 40 mg via ORAL
  Filled 2020-08-27 (×3): qty 1

## 2020-08-27 MED ORDER — GABAPENTIN 400 MG PO CAPS
800.0000 mg | ORAL_CAPSULE | Freq: Three times a day (TID) | ORAL | Status: DC
  Administered 2020-08-27 – 2020-08-30 (×8): 800 mg via ORAL
  Filled 2020-08-27 (×8): qty 2

## 2020-08-27 MED ORDER — FUROSEMIDE 20 MG PO TABS
20.0000 mg | ORAL_TABLET | Freq: Every day | ORAL | Status: DC
  Administered 2020-08-27 – 2020-08-28 (×2): 20 mg via ORAL
  Filled 2020-08-27 (×2): qty 1

## 2020-08-27 MED ORDER — BUPROPION HCL 100 MG PO TABS
100.0000 mg | ORAL_TABLET | Freq: Two times a day (BID) | ORAL | Status: DC
  Administered 2020-08-27 – 2020-08-30 (×5): 100 mg via ORAL
  Filled 2020-08-27 (×5): qty 1

## 2020-08-27 MED ORDER — INSULIN ASPART 100 UNIT/ML IJ SOLN
0.0000 [IU] | Freq: Every day | INTRAMUSCULAR | Status: DC
  Administered 2020-08-29: 3 [IU] via SUBCUTANEOUS

## 2020-08-27 MED ORDER — PIPERACILLIN-TAZOBACTAM 3.375 G IVPB 30 MIN
3.3750 g | Freq: Once | INTRAVENOUS | Status: AC
  Administered 2020-08-27: 3.375 g via INTRAVENOUS
  Filled 2020-08-27: qty 50

## 2020-08-27 MED ORDER — AMLODIPINE BESYLATE 5 MG PO TABS
5.0000 mg | ORAL_TABLET | Freq: Every day | ORAL | Status: DC
  Administered 2020-08-27 – 2020-08-30 (×3): 5 mg via ORAL
  Filled 2020-08-27 (×3): qty 1

## 2020-08-27 MED ORDER — INSULIN GLARGINE 100 UNIT/ML SOLOSTAR PEN: 30.0000 [IU] | PEN_INJECTOR | Freq: Every day | SUBCUTANEOUS | Status: DC

## 2020-08-27 MED ORDER — MORPHINE SULFATE (PF) 4 MG/ML IV SOLN
4.0000 mg | Freq: Once | INTRAVENOUS | Status: AC
  Administered 2020-08-27: 4 mg via INTRAVENOUS
  Filled 2020-08-27: qty 1

## 2020-08-27 MED ORDER — PIPERACILLIN-TAZOBACTAM 3.375 G IVPB
3.3750 g | Freq: Three times a day (TID) | INTRAVENOUS | Status: DC
  Administered 2020-08-27 – 2020-08-30 (×9): 3.375 g via INTRAVENOUS
  Filled 2020-08-27 (×9): qty 50

## 2020-08-27 MED ORDER — ENSURE ENLIVE PO LIQD
237.0000 mL | Freq: Two times a day (BID) | ORAL | Status: DC
  Administered 2020-08-28 – 2020-08-30 (×4): 237 mL via ORAL

## 2020-08-27 MED ORDER — ONDANSETRON HCL 4 MG/2ML IJ SOLN: 4.0000 mg | Freq: Four times a day (QID) | INTRAMUSCULAR | Status: DC | PRN

## 2020-08-27 MED ORDER — TIZANIDINE HCL 2 MG PO TABS: 4.0000 mg | ORAL_TABLET | Freq: Three times a day (TID) | ORAL | Status: DC | PRN

## 2020-08-27 MED ORDER — ONDANSETRON HCL 4 MG PO TABS: 4.0000 mg | ORAL_TABLET | Freq: Four times a day (QID) | ORAL | Status: DC | PRN

## 2020-08-27 MED ORDER — ASPIRIN EC 81 MG PO TBEC
81.0000 mg | DELAYED_RELEASE_TABLET | Freq: Every day | ORAL | Status: DC
  Administered 2020-08-27 – 2020-08-30 (×3): 81 mg via ORAL
  Filled 2020-08-27 (×3): qty 1

## 2020-08-27 NOTE — Consult Note (Signed)
Reason for Consult: Cellulitis, right third toe Referring Physician: Dr. Mikael Spray [name] is an 60 y.o. male.  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  He states he developed an ulceration on the tip and then started developing drainage.  He is a known diabetic but states that his diabetes has been under control.  He denies any trauma to the right foot.  Past Medical History:  Diagnosis Date   Arthritis    Diabetes mellitus    GERD (gastroesophageal reflux disease)    Headache(784.0)    History of kidney stones    Hypertension    Kidney stones    Neuropathy     Past Surgical History:  Procedure Laterality Date   AMPUTATION TOE Right 06/21/2019   Procedure: AMPUTATION TOE;  Surgeon: Aviva Signs, MD;  Location: [facility];  Service: General;  Laterality: Right;   CARDIAC CATHETERIZATION  2009   CHOLECYSTECTOMY N/A 10/24/2013   Procedure: LAPAROSCOPIC CHOLECYSTECTOMY;  Surgeon: Jamesetta So, MD;  Location: [facility];  Service: General;  Laterality: N/A;   COLONOSCOPY N/A 03/31/2013   Procedure: COLONOSCOPY;  Surgeon: Rogene Houston, MD;  [facility]: [facility];  Service: Endoscopy;  Laterality: N/A;  40    Family History  Problem Relation Age of Onset   Diabetes Mother    Heart failure Mother    Heart disease Mother    Heart failure Father     Social History:  reports that he has quit smoking. His smoking use included cigarettes. He has never used smokeless tobacco. He reports that he does not drink alcohol and does not use drugs.  Allergies:  Allergies  Allergen Reactions   Latex Rash and Other (See Comments)    Redness     Medications: Prior to Admission: (Not in a hospital admission)   Results for orders placed or performed during the hospital encounter of 08/27/20 (from the past 48 hour(s))  CBC  with Differential     Status: Abnormal   Collection Time: 08/27/20 10:10 AM  Result Value Ref Range   WBC 14.0 (H) 4.0 - 10.5 K/uL   RBC 4.42 4.22 - 5.81 MIL/uL   Hemoglobin 13.5 13.0 - 17.0 g/dL   HCT 39.3 39.0 - 52.0 %   MCV 88.9 80.0 - 100.0 fL   MCH 30.5 26.0 - 34.0 pg   MCHC 34.4 30.0 - 36.0 g/dL   RDW 13.4 11.5 - 15.5 %   Platelets 263 150 - 400 K/uL   nRBC 0.0 0.0 - 0.2 %   Neutrophils Relative % 75 %   Neutro Abs 10.4 (H) 1.7 - 7.7 K/uL   Lymphocytes Relative 17 %   Lymphs Abs 2.4 0.7 - 4.0 K/uL   Monocytes Relative 7 %   Monocytes Absolute 1.0 0.1 - 1.0 K/uL   Eosinophils Relative 1 %   Eosinophils Absolute 0.1 0.0 - 0.5 K/uL   Basophils Relative 0 %   Basophils Absolute 0.1 0.0 - 0.1 K/uL   Immature Granulocytes 0 %   Abs Immature Granulocytes 0.06 0.00 - 0.07 K/uL    Comment: Performed at [facility], [address] [place], [place] [id number]  Basic metabolic panel     Status: Abnormal   Collection Time: 08/27/20 10:10 AM  Result Value Ref Range   Sodium 138 135 -  145 mmol/L   Potassium 3.4 (L) 3.5 - 5.1 mmol/L   Chloride 104 98 - 111 mmol/L   CO2 27 22 - 32 mmol/L   Glucose, Bld 146 (H) 70 - 99 mg/dL    Comment: Glucose reference range applies only to samples taken after fasting for at least 8 hours.   BUN 18 6 - 20 mg/dL   Creatinine, Ser 0.84 0.61 - 1.24 mg/dL   Calcium 9.3 8.9 - 10.3 mg/dL   GFR, Estimated >60 >60 mL/min    Comment: (NOTE) Calculated using the CKD-EPI Creatinine Equation (2021)    Anion gap 7 5 - 15    Comment: Performed at [facility], [address], [place], [place] [address]  Culture, blood (routine x 2)     Status: None (Preliminary result)   Collection Time: 08/27/20 10:11 AM   Specimen: BLOOD RIGHT ARM  Result Value Ref Range   Specimen Description BLOOD RIGHT ARM    Special Requests      BOTTLES DRAWN AEROBIC AND ANAEROBIC Blood Culture adequate volume Performed at [facility], [address] [place], [place] [address]     Culture PENDING    Report Status PENDING   Lactic acid, plasma     Status: None   Collection Time: 08/27/20 10:12 AM  Result Value Ref Range   Lactic Acid, Venous 0.9 0.5 - 1.9 mmol/L    Comment: Performed at [facility], [address] [place], [place] [address]  Culture, blood (routine x 2)     Status: None (Preliminary result)   Collection Time: 08/27/20 10:17 AM   Specimen: Left Antecubital; Blood  Result Value Ref Range   Specimen Description LEFT ANTECUBITAL    Special Requests      BOTTLES DRAWN AEROBIC AND ANAEROBIC Blood Culture adequate volume Performed at [facility], [address] [place], [place] [address]    Culture PENDING    Report Status PENDING     DG Foot 2 Views Right  Result Date: 08/27/2020 CLINICAL DATA:  Infected third toe.  Diabetic. EXAM: RIGHT FOOT - 2 VIEW COMPARISON:  06/16/2019 FINDINGS: Interval amputation of the fourth toe. Interval development of lytic destruction and soft tissue destruction of the distal third toe, with lytic change of the distal phalanx and probable pathologic fracture of the middle phalanx. IMPRESSION: Interval amputation of the fourth toe. Lytic destruction of the distal phalanx of the third toe. Pathologic fracture of the middle phalanx of the third toe. Electronically Signed   By: Nelson Chimes M.D.   On: 08/27/2020 10:41    ROS:  Pertinent items are noted in HPI.  Blood pressure (!) 154/86, pulse 94, temperature 98.5 F (36.9 C), temperature source Oral, resp. rate 18, height '5\' 9"'$  (1.753 m), weight 89.8 kg, SpO2 100 %. Physical Exam: Pleasant white male no acute distress Head is normocephalic, atraumatic Lungs clear to auscultation with good breath sounds bilaterally Heart examination reveals a regular rate and rhythm without S3, S4, murmurs Extremity examination reveals some weakly palpable dorsalis pedis and posterior tibial pulse on the right foot.  The right third toe is noted to be significantly cellulitic down to the  base.  There is a draining ulceration on its tip.  No other toes are involved.  A healed right fourth toe amputation site is noted.  Previous notes reviewed  Assessment/Plan: Impression: Cellulitis of right third toe, diabetes mellitus Plan: Patient to be admitted to the hospitalist for further treatment.  Would start broad-spectrum antibiotic.  We will see how the toe demarcates over the next 24 to 48 hours.  He may need a right third toe amputation.  This was explained to the patient.  We will reevaluate in the morning.  Aviva Signs 08/27/2020, 11:49 AM

## 2020-08-27 NOTE — ED Notes (Signed)
ED TO INPATIENT HANDOFF REPORT  ED Nurse Name and Phone #:    S Name/Age/Gender [name] 60 y.o. male Room/Bed: APA19/APA19  Code Status   Code Status: Prior  Home/SNF/Other Home Patient oriented to: self, place, time and situation Is this baseline? Yes   Triage Complete: Triage complete  Chief Complaint Diabetic foot infection ([place]) [id number], L08.9]  Triage Note Pt diabetic.  Reports wound to 3rd toe x 1 1/2 weeks.  Toe red, swollen, wound noted to end of 3rd toe.  Reports had 4th toe amputated due to wound.  Reports cbg 103 this morning.    Allergies Allergies  Allergen Reactions  . Latex Rash and Other (See Comments)    Redness     Level of Care/Admitting Diagnosis ED Disposition    ED Disposition  Admit   Condition  --   [place]: [facility] [id number]  Level of Care: Med-Surg [16]  Covid Evaluation: Asymptomatic Screening Protocol (No Symptoms)  Diagn[facility]: [id number]  Admitting Physician: Hosie Poisson [[id number]]  Attending Physician: Hosie Poisson [[id number]]  Estimated length of stay: past midnight tomorrow  Certification:: I certify this patient will need inpatient services for at least 2 midnights         B Medical/Surgery History Past Medical History:  Diagnosis Date  . Arthritis   . Diabetes mellitus   . GERD (gastroesophageal reflux disease)   . Headache(784.0)   . History of kidney stones   . Hypertension   . Kidney stones   . Neuropathy    Past Surgical History:  Procedure Laterality Date  . AMPUTATION TOE Right 06/21/2019   Procedure: AMPUTATION TOE;  Surgeon: Aviva Signs, MD;  Location: [facility] ORS;  Service: General;  Laterality: Right;  . CARDIAC CATHETERIZATION  2009  . CHOLECYSTECTOMY N/A 10/24/2013   Procedure: LAPAROSCOPIC CHOLECYSTECTOMY;  Surgeon: Jamesetta So, MD;  Location: [facility] ORS;  Service: General;  Laterality: N/A;  . COLONOSCOPY N/A 03/31/2013   Procedure: COLONOSCOPY;   Surgeon: Rogene Houston, MD;  Location: [facility] ENDO SUITE;  Service: Endoscopy;  Laterality: N/A;  730     A IV Location/Drains/Wounds Patient Lines/Drains/Airways Status    Active Line/Drains/Airways    Name Placement date Placement time Site Days   Peripheral IV 08/27/20 20 G Right Forearm 08/27/20  1011  Forearm  less than 1   Incision (Closed) 10/24/13 Abdomen Other (Comment) 10/24/13  0918  -- 2499   Incision (Closed) 06/21/19 Foot Right 06/21/19  0952  -- 433   Incision (Closed) 10/12/19 Back 10/12/19  1110  -- 320   Incision - 4 Ports Abdomen 1: Umbilicus 2: Mid;Upper 3: Right;Medial;Upper 4: Right;Lateral;Upper 10/24/13  0911  -- 2499   Wound / Incision (Open or Dehisced) 06/16/19 Diabetic ulcer Toe (Comment  which one) Right 06/16/19  2325  Toe (Comment  which one)  438          Intake/Output Last 24 hours No intake or output data in the 24 hours ending 08/27/20 1323  Labs/Imaging Results for orders placed or performed during the hospital encounter of 08/27/20 (from the past 48 hour(s))  CBC with Differential     Status: Abnormal   Collection Time: 08/27/20 10:10 AM  Result Value Ref Range   WBC 14.0 (H) 4.0 - 10.5 K/uL   RBC 4.42 4.22 - 5.81 MIL/uL   Hemoglobin 13.5 13.0 - 17.0 g/dL   HCT 39.3 39.0 - 52.0 %   MCV 88.9 80.0 -  100.0 fL   MCH 30.5 26.0 - 34.0 pg   MCHC 34.4 30.0 - 36.0 g/dL   RDW 13.4 11.5 - 15.5 %   Platelets 263 150 - 400 K/uL   nRBC 0.0 0.0 - 0.2 %   Neutrophils Relative % 75 %   Neutro Abs 10.4 (H) 1.7 - 7.7 K/uL   Lymphocytes Relative 17 %   Lymphs Abs 2.4 0.7 - 4.0 K/uL   Monocytes Relative 7 %   Monocytes Absolute 1.0 0.1 - 1.0 K/uL   Eosinophils Relative 1 %   Eosinophils Absolute 0.1 0.0 - 0.5 K/uL   Basophils Relative 0 %   Basophils Absolute 0.1 0.0 - 0.1 K/uL   Immature Granulocytes 0 %   Abs Immature Granulocytes 0.06 0.00 - 0.07 K/uL    Comment: Performed at [facility], [address] [place], [place] [id number]  Basic metabolic  panel     Status: Abnormal   Collection Time: 08/27/20 10:10 AM  Result Value Ref Range   Sodium 138 135 - 145 mmol/L   Potassium 3.4 (L) 3.5 - 5.1 mmol/L   Chloride 104 98 - 111 mmol/L   CO2 27 22 - 32 mmol/L   Glucose, Bld 146 (H) 70 - 99 mg/dL    Comment: Glucose reference range applies only to samples taken after fasting for at least 8 hours.   BUN 18 6 - 20 mg/dL   Creatinine, Ser 0.84 0.61 - 1.24 mg/dL   Calcium 9.3 8.9 - 10.3 mg/dL   GFR, Estimated >60 >60 mL/min    Comment: (NOTE) Calculated using the CKD-EPI Creatinine Equation (2021)    Anion gap 7 5 - 15    Comment: Performed at [facility], [address], [place], [place] [address]  Culture, blood (routine x 2)     Status: None (Preliminary result)   Collection Time: 08/27/20 10:11 AM   Specimen: BLOOD RIGHT ARM  Result Value Ref Range   Specimen Description BLOOD RIGHT ARM    Special Requests      BOTTLES DRAWN AEROBIC AND ANAEROBIC Blood Culture adequate volume Performed at [facility], [address], [place], [place] [address]    Culture PENDING    Report Status PENDING   Lactic acid, plasma     Status: None   Collection Time: 08/27/20 10:12 AM  Result Value Ref Range   Lactic Acid, Venous 0.9 0.5 - 1.9 mmol/L    Comment: Performed at [facility], [address], [place], [place] [address]  Culture, blood (routine x 2)     Status: None (Preliminary result)   Collection Time: 08/27/20 10:17 AM   Specimen: Left Antecubital; Blood  Result Value Ref Range   Specimen Description LEFT ANTECUBITAL    Special Requests      BOTTLES DRAWN AEROBIC AND ANAEROBIC Blood Culture adequate volume Performed at [facility], [address], [place], [place] [address]    Culture PENDING    Report Status PENDING    DG Foot 2 Views Right  Result Date: 08/27/2020 CLINICAL DATA:  Infected third toe.  Diabetic. EXAM: RIGHT FOOT - 2 VIEW COMPARISON:  06/16/2019 FINDINGS: Interval amputation of the fourth toe. Interval development  of lytic destruction and soft tissue destruction of the distal third toe, with lytic change of the distal phalanx and probable pathologic fracture of the middle phalanx. IMPRESSION: Interval amputation of the fourth toe. Lytic destruction of the distal phalanx of the third toe. Pathologic fracture of the middle phalanx of  the third toe. Electronically Signed   By: Nelson Chimes M.D.   On: 08/27/2020 10:41    Pending Labs Unresulted Labs (From admission, onward)    Start     Ordered   08/27/20 1155  Hemoglobin A1c  Add-on,   AD        08/27/20 1154   Signed and Held  HIV Antibody (routine testing w rflx)  (HIV Antibody (Routine testing w reflex) panel)  Once,   R        Signed and Held   Signed and Held  Creatinine, serum  (enoxaparin (LOVENOX)    CrCl >/= 30 ml/min)  Once,   R       Comments: Baseline for enoxaparin therapy IF NOT ALREADY DRAWN.    Signed and Held   Signed and Held  Creatinine, serum  (enoxaparin (LOVENOX)    CrCl >/= 30 ml/min)  Weekly,   R     Comments: while on enoxaparin therapy    Signed and Held   Signed and Held  Hemoglobin A1c  Tomorrow morning,   R        Signed and Held          Vitals/Pain Today's Vitals   08/27/20 0953 08/27/20 0954 08/27/20 1100 08/27/20 1300  BP:  (!) 167/92 (!) 154/86 (!) 166/93  Pulse:  96 94 87  Resp:  20 18   Temp:  98.5 F (36.9 C)    TempSrc:  Oral    SpO2:  100% 100% 99%  Weight: 198 lb (89.8 kg)     Height: '5\' 9"'$  (1.753 m)     PainSc: 10-Worst pain ever       Isolation Precautions No active isolations  Medications Medications  potassium chloride SA (KLOR-CON) CR tablet 40 mEq (has no administration in time range)  vancomycin (VANCOCIN) IVPB 1000 mg/200 mL premix (has no administration in time range)  vancomycin (VANCOCIN) IVPB 1000 mg/200 mL premix (0 mg Intravenous Stopped 08/27/20 1209)  piperacillin-tazobactam (ZOSYN) IVPB 3.375 g (0 g Intravenous Stopped 08/27/20 1055)  ondansetron (ZOFRAN) injection 4 mg (4 mg  Intravenous Given 08/27/20 1020)  morphine 4 MG/ML injection 4 mg (4 mg Intravenous Given 08/27/20 1201)    Mobility walks Low fall risk   Focused Assessments    R Recommendations: See Admitting Provider Note  Report given to:   Additional Notes:

## 2020-08-27 NOTE — ED Provider Notes (Signed)
[facility] EMERGENCY DEPARTMENT Provider Note   CSN: [id number] Arrival date & time: 08/27/20  0937     History Chief Complaint  Patient presents with   Toe Pain    [name] is a 60 y.o. male.  Increased redness and purulent discharge from the right foot third toe.  This is been going on for about 10 days but worse in the last 2 days per patient.  Describes moderate aching sensation in the toe as well.  Denies any fevers or chills.  No vomiting or diarrhea.  Patient is diabetic, has had infections in his right foot before, has had amputation of his fourth toe in the past as well.      Past Medical History:  Diagnosis Date   Arthritis    Diabetes mellitus    GERD (gastroesophageal reflux disease)    Headache(784.0)    History of kidney stones    Hypertension    Kidney stones    Neuropathy     Patient Active Problem List   Diagnosis Date Noted   Diabetic foot infection ([place]) 08/27/2020   Spondylolisthesis of lumbar region 10/12/2019   Osteomyelitis due to type 2 diabetes mellitus ([place]) 06/16/2019   Osteomyelitis of fourth toe of right foot ([place]) 06/16/2019   Acute biliary pancreatitis [id number]   Biliary colic [id number]   Abdominal pain, right upper quadrant 10/21/2013   Esophageal ulcer without bleeding 08/26/2010   HTN (hypertension) 08/26/2010   DM type 2 (diabetes mellitus, type 2) ([place]) 08/25/2010   Chronic pain 08/25/2010    Past Surgical History:  Procedure Laterality Date   AMPUTATION TOE Right 06/21/2019   Procedure: AMPUTATION TOE;  Surgeon: Aviva Signs, MD;  Location: [facility];  Service: General;  Laterality: Right;   CARDIAC CATHETERIZATION  2009   CHOLECYSTECTOMY N/A 10/24/2013   Procedure: LAPAROSCOPIC CHOLECYSTECTOMY;  Surgeon: Jamesetta So, MD;  Location: [facility] OR[facility];  Service: General;  Laterality: N/A;   COLONOSCOPY N/A 03/31/2013   Procedure: COLONOSCOPY;  Surgeon: Rogene Houston, MD;  Location: [facility] END[facility];  Service: Endoscopy;   Laterality: N/A;  65       Family History  Problem Relation Age of Onset   Diabetes Mother    Heart failure Mother    Heart disease Mother    Heart failure Father     Social History   Tobacco Use   Smoking status: Former    Types: Cigarettes   Smokeless tobacco: Never  Vaping Use   Vaping Use: Never used  Substance Use Topics   Alcohol use: No   Drug use: No    Home Medications Prior to Admission medications   Medication Sig Start Date End Date Taking? Authorizing Provider  acetaminophen (TYLENOL) 325 MG tablet Take 2 tablets (650 mg total) by mouth every 4 (four) hours as needed for mild pain or headache. 06/22/19   Roxan Hockey, MD  ALPRAZolam Duanne Moron) 0.5 MG tablet Take 0.5 mg by mouth in the morning and at bedtime.  02/25/15   [provider]  amLODipine (NORVASC) 5 MG tablet Take 5 mg by mouth daily.    [provider]  aspirin EC 81 MG tablet Take 1 tablet (81 mg total) by mouth daily with breakfast. 06/22/19   Emokpae, Courage, MD  atorvastatin (LIPITOR) 20 MG tablet Take 20 mg by mouth daily. 03/31/19   [provider]  buPROPion (WELLBUTRIN) 100 MG tablet Take 100 mg by mouth 2 (two) times daily. 02/24/19   [provider]  diltiazem (TIAZAC) 420 MG 24 hr capsule Take 420 mg by mouth daily. 02/20/19   [provider]  docusate sodium (COLACE) 100 MG capsule Take 1 capsule (100 mg total) by mouth 2 (two) times daily. 10/13/19   Viona Gilmore D, NP  doxycycline (VIBRAMYCIN) 50 MG capsule Take 2 capsules (100 mg total) by mouth 2 (two) times daily. 11/29/19   Aviva Signs, MD  furosemide (LASIX) 20 MG tablet Take 20 mg by mouth daily. 11/25/19   [provider]  gabapentin (NEURONTIN) 800 MG tablet Take 800 mg by mouth 3 (three) times daily. 08/29/19   [provider]  insulin aspart (NOVOLOG) 100 UNIT/ML FlexPen Inject 0-10 Units into the skin 3 (three) times daily with meals. insulin aspart (novoLOG) injection 0-10  Units 0-10 Units Subcutaneous, 3 times daily with meals CBG < 70: Implement Hypoglycemia Standing Orders and refer to Hypoglycemia Standing Orders sidebar report  CBG 70 - 120: 0 unit CBG 121 - 150: 0 unit  CBG 151 - 200: 1 unit CBG 201 - 250: 2 units CBG 251 - 300: 4 units CBG 301 - 350: 6 units  CBG 351 - 400: 8 units  CBG > 400: 10 units 06/22/19   Emokpae, Courage, MD  JANUVIA 100 MG tablet Take 100 mg by mouth daily. 11/10/18   [provider]  LANTUS SOLOSTAR 100 UNIT/ML Solostar Pen Inject 30 Units into the skin at bedtime. 09/19/19   [provider]  Menthol-Methyl Salicylate (MUSCLE RUB) 10-15 % CREA Apply 1 application topically 4 (four) times daily as needed for muscle pain.    [provider]  metFORMIN (GLUCOPHAGE) 500 MG tablet Take 1,000 mg by mouth in the morning and at bedtime. In the morning & 1300    [provider]  Multiple Vitamin (MULTIVITAMIN WITH MINERALS) TABS tablet Take 1 tablet by mouth daily.    [provider]  oxyCODONE 10 MG TABS Take 1 tablet (10 mg total) by mouth every 4 (four) hours as needed for severe pain ((score 7 to 10)). 10/13/19   Viona Gilmore D, NP  pantoprazole (PROTONIX) 40 MG tablet TAKE 1 TABLET(40 MG) BY MOUTH DAILY 04/30/20   Arnoldo Lenis, MD  polyethylene glycol (MIRALAX / GLYCOLAX) 17 g packet Take 17 g by mouth daily as needed for mild constipation. 06/22/19   Roxan Hockey, MD  tiZANidine (ZANAFLEX) 4 MG tablet Take 1 tablet (4 mg total) by mouth every 8 (eight) hours as needed for muscle spasms. 10/13/19   Viona Gilmore D, NP    Allergies    Latex  Review of Systems   Review of Systems  Constitutional:  Negative for fever.  HENT:  Negative for ear pain and sore throat.   Eyes:  Negative for pain.  Respiratory:  Negative for cough.   Cardiovascular:  Negative for chest pain.  Gastrointestinal:  Negative for abdominal pain.  Genitourinary:  Negative for flank pain.  Musculoskeletal:   Negative for back pain.  Skin:  Negative for color change and rash.  Neurological:  Negative for syncope.  All other systems reviewed and are negative.  Physical Exam Updated Vital Signs BP (!) 154/86 (BP Location: Left Arm)   Pulse 94   Temp 98.5 F (36.9 C) (Oral)   Resp 18   Ht '5\' 9"'$  (1.753 m)   Wt 89.8 kg   SpO2 100%   BMI 29.24 kg/m   Physical Exam Constitutional:      Appearance: He is  well-developed.  HENT:     Head: Normocephalic.     Nose: Nose normal.  Eyes:     Extraocular Movements: Extraocular movements intact.  Cardiovascular:     Rate and Rhythm: Normal rate.  Pulmonary:     Effort: Pulmonary effort is normal.  Musculoskeletal:     Comments: Right foot third toe has purulent drainage from the distal tip.  Erosion present at the distal tip.  Cellulitis of the third toe all the way down to the base of the toe present.  Moderate tenderness on palpation.  Skin:    Coloration: Skin is not jaundiced.  Neurological:     Mental Status: He is alert. Mental status is at baseline.    ED Results / Procedures / Treatments   Labs (all labs ordered are listed, but only abnormal results are displayed) Labs Reviewed  CBC WITH DIFFERENTIAL/PLATELET - Abnormal; Notable for the following components:      Result Value   WBC 14.0 (*)    Neutro Abs 10.4 (*)    All other components within normal limits  BASIC METABOLIC PANEL - Abnormal; Notable for the following components:   Potassium 3.4 (*)    Glucose, Bld 146 (*)    All other components within normal limits  CULTURE, BLOOD (ROUTINE X 2)  CULTURE, BLOOD (ROUTINE X 2)  LACTIC ACID, PLASMA    EKG None  Radiology DG Foot 2 Views Right  Result Date: 08/27/2020 CLINICAL DATA:  Infected third toe.  Diabetic. EXAM: RIGHT FOOT - 2 VIEW COMPARISON:  06/16/2019 FINDINGS: Interval amputation of the fourth toe. Interval development of lytic destruction and soft tissue destruction of the distal third toe, with lytic change  of the distal phalanx and probable pathologic fracture of the middle phalanx. IMPRESSION: Interval amputation of the fourth toe. Lytic destruction of the distal phalanx of the third toe. Pathologic fracture of the middle phalanx of the third toe. Electronically Signed   By: Nelson Chimes M.D.   On: 08/27/2020 10:41    Procedures Procedures   Medications Ordered in ED Medications  vancomycin (VANCOCIN) IVPB 1000 mg/200 mL premix (1,000 mg Intravenous New Bag/Given 08/27/20 1057)  morphine 4 MG/ML injection 4 mg (has no administration in time range)  piperacillin-tazobactam (ZOSYN) IVPB 3.375 g (0 g Intravenous Stopped 08/27/20 1055)  ondansetron (ZOFRAN) injection 4 mg (4 mg Intravenous Given 08/27/20 1020)    ED Course  I have reviewed the triage vital signs and the nursing notes.  Pertinent labs & imaging results that were available during my care of the patient were reviewed by me and considered in my medical decision making (see chart for details).    MDM Rules/Calculators/A&P                           Patient white count elevated 14 lactic acid normal.  Blood cultures are sent, patient started on broad-spectrum antibiotics.  X-ray of the right toe shows lytic destruction of the distal tip of the third toe.  Surgery consulted.  Medicine consulted for admission.  Final Clinical Impression(s) / ED Diagnoses Final diagnoses:  Diabetic foot infection [facility])    Rx / DC Orders ED Discharge Orders     None        Luna Fuse, MD 08/27/20 1152

## 2020-08-27 NOTE — H&P (Signed)
History and Physical    [name] [id number] DOB: 1960-08-24 DOA: 08/27/2020  PCP: [name], MD  Patient coming from: Home.   I have personally briefly reviewed patient's old medical records in [place]  Chief Complaint : right toe pain.   HPI: [name] is a 60 y.o. male with medical history significant of DM, hypertension, GERD;  h/o 4 th toe amputation in 2021, presents to ED with severe pain and swelling of the right foot, right leg and gangrenous looking 3 rd toe on the right since one week. He reports it started with a small ulceration. He denies fever, chills, nausea, vomiting, abdominal pain, dysuria, sob , chest pain, cough, .     ED Course: labs  pertinent for potassium of 3.4 , lactic acid of 0.9, wbc count of 14. Blood cultures ordered.  Hemoglobin A!c is 5.6. X rays of the foot Interval amputation of the fourth toe.  Lytic destruction of the distal phalanx of the third toe. Pathologic fracture of the middle phalanx of the third toe.  Dr Arnoldo Morale consulted by EDP he was [facility] to [facility] for admission.     Review of Systems: As per HPI  other wife All others reviewed and are negative,.   Past Medical History:  Diagnosis Date   Arthritis    Diabetes mellitus    GERD (gastroesophageal reflux disease)    Headache(784.0)    History of kidney stones    Hypertension    Kidney stones    Neuropathy     Past Surgical History:  Procedure Laterality Date   AMPUTATION TOE Right 06/21/2019   Procedure: AMPUTATION TOE;  Surgeon: Aviva Signs, MD;  Location: [facility] ORS;  Service: General;  Laterality: Right;   CARDIAC CATHETERIZATION  2009   CHOLECYSTECTOMY N/A 10/24/2013   Procedure: LAPAROSCOPIC CHOLECYSTECTOMY;  Surgeon: Jamesetta So, MD;  Location: [facility] ORS;  Service: General;  Laterality: N/A;   COLONOSCOPY N/A 03/31/2013   Procedure: COLONOSCOPY;  Surgeon: Rogene Houston, MD;  Location: [facility] ENDO SUITE;  Service: Endoscopy;  Laterality: N/A;   730    Social History  reports that he has quit smoking. His smoking use included cigarettes. He has never used smokeless tobacco. He reports that he does not drink alcohol and does not use drugs.  Allergies  Allergen Reactions   Latex Rash and Other (See Comments)    Redness     Family History  Problem Relation Age of Onset   Diabetes Mother    Heart failure Mother    Heart disease Mother    Heart failure Father    Family History is pertinent.   Prior to Admission medications   Medication Sig Start Date End Date Taking? Authorizing Provider  acetaminophen (TYLENOL) 325 MG tablet Take 2 tablets (650 mg total) by mouth every 4 (four) hours as needed for mild pain or headache. 06/22/19   Roxan Hockey, MD  ALPRAZolam Duanne Moron) 0.5 MG tablet Take 0.5 mg by mouth in the morning and at bedtime.  02/25/15   [provider]  amLODipine (NORVASC) 5 MG tablet Take 5 mg by mouth daily.    [provider]  aspirin EC 81 MG tablet Take 1 tablet (81 mg total) by mouth daily with breakfast. 06/22/19   Emokpae, Courage, MD  atorvastatin (LIPITOR) 20 MG tablet Take 20 mg by mouth daily. 03/31/19   [provider]  buPROPion (WELLBUTRIN) 100 MG tablet Take 100 mg by mouth 2 (two) times  daily. 02/24/19   [provider]  diltiazem (TIAZAC) 420 MG 24 hr capsule Take 420 mg by mouth daily. 02/20/19   [provider]  docusate sodium (COLACE) 100 MG capsule Take 1 capsule (100 mg total) by mouth 2 (two) times daily. 10/13/19   Viona Gilmore D, NP  doxycycline (VIBRAMYCIN) 50 MG capsule Take 2 capsules (100 mg total) by mouth 2 (two) times daily. 11/29/19   Aviva Signs, MD  furosemide (LASIX) 20 MG tablet Take 20 mg by mouth daily. 11/25/19   [provider]  gabapentin (NEURONTIN) 800 MG tablet Take 800 mg by mouth 3 (three) times daily. 08/29/19   [provider]  insulin aspart (NOVOLOG) 100 UNIT/ML FlexPen Inject 0-10 Units into the skin 3 (three)  times daily with meals. insulin aspart (novoLOG) injection 0-10 Units 0-10 Units Subcutaneous, 3 times daily with meals CBG < 70: Implement Hypoglycemia Standing Orders and refer to Hypoglycemia Standing Orders sidebar report  CBG 70 - 120: 0 unit CBG 121 - 150: 0 unit  CBG 151 - 200: 1 unit CBG 201 - 250: 2 units CBG 251 - 300: 4 units CBG 301 - 350: 6 units  CBG 351 - 400: 8 units  CBG > 400: 10 units 06/22/19   Emokpae, Courage, MD  JANUVIA 100 MG tablet Take 100 mg by mouth daily. 11/10/18   [provider]  LANTUS SOLOSTAR 100 UNIT/ML Solostar Pen Inject 30 Units into the skin at bedtime. 09/19/19   [provider]  Menthol-Methyl Salicylate (MUSCLE RUB) 10-15 % CREA Apply 1 application topically 4 (four) times daily as needed for muscle pain.    [provider]  metFORMIN (GLUCOPHAGE) 500 MG tablet Take 1,000 mg by mouth in the morning and at bedtime. In the morning & 1300    [provider]  Multiple Vitamin (MULTIVITAMIN WITH MINERALS) TABS tablet Take 1 tablet by mouth daily.    [provider]  oxyCODONE 10 MG TABS Take 1 tablet (10 mg total) by mouth every 4 (four) hours as needed for severe pain ((score 7 to 10)). 10/13/19   Viona Gilmore D, NP  pantoprazole (PROTONIX) 40 MG tablet TAKE 1 TABLET(40 MG) BY MOUTH DAILY 04/30/20   Arnoldo Lenis, MD  polyethylene glycol (MIRALAX / GLYCOLAX) 17 g packet Take 17 g by mouth daily as needed for mild constipation. 06/22/19   Roxan Hockey, MD  tiZANidine (ZANAFLEX) 4 MG tablet Take 1 tablet (4 mg total) by mouth every 8 (eight) hours as needed for muscle spasms. 10/13/19   Viona Gilmore D, NP    Physical Exam: Vitals:   08/27/20 0953 08/27/20 0954 08/27/20 1100  BP:  (!) 167/92 (!) 154/86  Pulse:  96 94  Resp:  20 18  Temp:  98.5 F (36.9 C)   TempSrc:  Oral   SpO2:  100% 100%  Weight: 89.8 kg    Height: '5\' 9"'$  (1.753 m)      Constitutional: NAD, calm, comfortable Vitals:   08/27/20 0953  08/27/20 0954 08/27/20 1100  BP:  (!) 167/92 (!) 154/86  Pulse:  96 94  Resp:  20 18  Temp:  98.5 F (36.9 C)   TempSrc:  Oral   SpO2:  100% 100%  Weight: 89.8 kg    Height: '5\' 9"'$  (1.753 m)     Eyes: PERRL, lids and conjunctivae normal ENMT: Mucous membranes are moist. Posterior pharynx clear of any exudate or lesions.Normal dentition.  Neck: normal, supple, no  masses, no thyromegaly Respiratory: clear to auscultation bilaterally, no wheezing, no crackles. Normal respiratory effort. No accessory muscle use.  Cardiovascular: Regular rate and rhythm, no murmurs / rubs / gallops. No extremity edema. Abdomen: no tenderness, no masses palpated. No hepatosplenomegaly. Bowel sounds positive.  Musculoskeletal: right foot swollen .  Skin: Gangrenous 3 rd toe on the right , with surrounding cellulitis ,  Neurologic: CN 2-12 grossly intact.  Psychiatric: Normal judgment and insight. Alert and oriented x 3. Normal mood.   (  Labs on Admission: I have personally reviewed following labs and imaging studies  CBC: Recent Labs  Lab 08/27/20 1010  WBC 14.0*  NEUTROABS 10.4*  HGB 13.5  HCT 39.3  MCV 88.9  PLT 99991111    Basic Metabolic Panel: Recent Labs  Lab 08/27/20 1010  NA 138  K 3.4*  CL 104  CO2 27  GLUCOSE 146*  BUN 18  CREATININE 0.84  CALCIUM 9.3    GFR: Estimated Creatinine Clearance: 106.2 mL/min (by C-G formula based on SCr of 0.84 mg/dL).  Liver Function Tests: No results for input(s): AST, ALT, ALKPHOS, BILITOT, PROT, ALBUMIN in the last 168 hours.  Urine analysis:    Component Value Date/Time   COLORURINE YELLOW 02/24/2017 [place] 02/24/2017 1239   LABSPEC 1.024 02/24/2017 1239   PHURINE 5.0 02/24/2017 1239   GLUCOSEU NEGATIVE 02/24/2017 1239   HGBUR NEGATIVE 02/24/2017 1239   BILIRUBINUR NEGATIVE 02/24/2017 1239   KETONESUR 5 (A) 02/24/2017 1239   PROTEINUR 30 (A) 02/24/2017 1239   UROBILINOGEN 0.2 06/12/2014 1255   NITRITE NEGATIVE  02/24/2017 1239   LEUKOCYTESUR NEGATIVE 02/24/2017 1239    Radiological Exams on Admission: DG Foot 2 Views Right  Result Date: 08/27/2020 CLINICAL DATA:  Infected third toe.  Diabetic. EXAM: RIGHT FOOT - 2 VIEW COMPARISON:  06/16/2019 FINDINGS: Interval amputation of the fourth toe. Interval development of lytic destruction and soft tissue destruction of the distal third toe, with lytic change of the distal phalanx and probable pathologic fracture of the middle phalanx. IMPRESSION: Interval amputation of the fourth toe. Lytic destruction of the distal phalanx of the third toe. Pathologic fracture of the middle phalanx of the third toe. Electronically Signed   By: Nelson Chimes M.D.   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  - pain control and appreciate Dr Arnoldo Morale input.  - plan for amputation of 3 rd toe.  Follow blood cultures .     Insulin dependent DM: with diabetic neuropathy CBG (last 3)  Recent Labs    08/27/20 1657  GLUCAP 115*   Get A1c  Resume SSI  Continue with gabapentin    Hypertension;  BP optimal.  Resume home meds.     GERD:  Stable.   Hypokalemia:  Replaced.      DVT prophylaxis: Lovenox.  Code Status:   Full code.  Family Communication:  None at bedside.  Disposition Plan:   Patient is from:  Home.   Anticipated DC to:  Home  Anticipated DC date:  08/29/20  Anticipated DC barriers: 3 rd toe infection.   Consults called:  Surgery.  Admission status:  Medsurg/Inpatient.   Severity of Illness: The appropriate patient status for this patient is INPATIENT. Inpatient status is judged to be reasonable and necessary in order to provide the  required intensity of service to ensure the patient's safety. The patient's presenting symptoms, physical exam findings, and initial  radiographic and laboratory data in the context of their chronic comorbidities is felt to place them at high risk for further clinical deterioration. Furthermore, it is not anticipated that the patient will be medically stable for discharge from the hospital within 2 midnights of admission.    * I certify that at the point of admission it is my clinical judgment that the patient will require inpatient hospital care spanning beyond 2 midnights from the point of admission due to high intensity of service, high risk for further deterioration and high frequency of surveillance required.*    Hosie Poisson MD [facility]  How to contact the [facility] Attending or Consulting provider [place] or covering provider during after hours [place], for this patient?   Check the care team in [facility] and look for a) attending/consulting [facility] provider listed and b) the [facility] team listed Log into [email or web address] and use [place]'s universal password to access. If you do not have the password, please contact the hospital operator. Locate the [facility], The provider you are looking for under [facility] and page to a number that you can be directly reached. If you still have difficulty reaching the provider, please page the [facility] (Director on Call) for the Hospitalists listed on amion for assistance.  08/27/2020, 12:57 PM

## 2020-08-27 NOTE — ED Triage Notes (Signed)
Pt diabetic.  Reports wound to 3rd toe x 1 1/2 weeks.  Toe red, swollen, wound noted to end of 3rd toe.  Reports had 4th toe amputated due to wound.  Reports cbg 103 this morning.

## 2020-08-27 NOTE — ED Notes (Signed)
2nd blood culture obtained by lab.

## 2020-08-27 NOTE — Progress Notes (Signed)
Pharmacy Antibiotic Note  [name] is a 60 y.o. male admitted on 08/27/2020 with  wound infection .  Pharmacy has been consulted for Vancomycin and zosyn dosing.  Plan: Vancomycin 2000 mg IV x 1 dose. Vancomycin 1250 mg IV every 12 hours. Zosyn 3.375g IV every 8 hours. Monitor labs, c/s, and vanco level as indicated.  Height: '5\' 9"'$  (175.3 cm) Weight: 89.8 kg (198 lb) IBW/kg (Calculated) : 70.7  Temp (24hrs), Avg:98.8 F (37.1 C), Min:98.5 F (36.9 C), Max:99.1 F (37.3 C)  Recent Labs  Lab 08/27/20 1010 08/27/20 1012  WBC 14.0*  --   CREATININE 0.84  --   LATICACIDVEN  --  0.9    Estimated Creatinine Clearance: 106.2 mL/min (by C-G formula based on SCr of 0.84 mg/dL).    Allergies  Allergen Reactions   Latex Rash and Other (See Comments)    Redness     Antimicrobials this admission: Vanco 8/8 >> Zosyn 8/8 >>  Microbiology results: 8/8 BCx: pending   Thank you for allowing pharmacy to be a part of this patient's care.  [name] 08/27/2020 2:13 PM

## 2020-08-27 NOTE — ED Notes (Signed)
Toe red, swollen, tip of 3rd toe missing.

## 2020-08-28 DIAGNOSIS — E11628 Type 2 diabetes mellitus with other skin complications: Secondary | ICD-10-CM

## 2020-08-28 DIAGNOSIS — L089 Local infection of the skin and subcutaneous tissue, unspecified: Secondary | ICD-10-CM | POA: Diagnosis not present

## 2020-08-28 LAB — BASIC METABOLIC PANEL
Anion gap: 9 (ref 5–15)
BUN: 24 mg/dL — ABNORMAL HIGH (ref 6–20)
CO2: 29 mmol/L (ref 22–32)
Calcium: 9.1 mg/dL (ref 8.9–10.3)
Chloride: 104 mmol/L (ref 98–111)
Creatinine, Ser: 0.97 mg/dL (ref 0.61–1.24)
GFR, Estimated: >60 mL/min (ref >60)
Glucose, Bld: 121 mg/dL — ABNORMAL HIGH (ref 70–99)
Potassium: 3.7 mmol/L (ref 3.5–5.1)
Sodium: 142 mmol/L (ref 135–145)

## 2020-08-28 LAB — CBC WITH DIFFERENTIAL/PLATELET
Abs Immature Granulocytes: 0.05 10*3/uL (ref 0.00–0.07)
Basophils Absolute: 0.1 10*3/uL (ref 0.0–0.1)
Basophils Relative: 1 %
Eosinophils Absolute: 0.2 10*3/uL (ref 0.0–0.5)
Eosinophils Relative: 2 %
HCT: 37.7 % — ABNORMAL LOW (ref 39.0–52.0)
Hemoglobin: 12.6 g/dL — ABNORMAL LOW (ref 13.0–17.0)
Immature Granulocytes: 0 %
Lymphocytes Relative: 26 %
Lymphs Abs: 3 10*3/uL (ref 0.7–4.0)
MCH: 30.5 pg (ref 26.0–34.0)
MCHC: 33.4 g/dL (ref 30.0–36.0)
MCV: 91.3 fL (ref 80.0–100.0)
Monocytes Absolute: 1 10*3/uL (ref 0.1–1.0)
Monocytes Relative: 9 %
Neutro Abs: 7 10*3/uL (ref 1.7–7.7)
Neutrophils Relative %: 62 %
Platelets: 250 10*3/uL (ref 150–400)
RBC: 4.13 MIL/uL — ABNORMAL LOW (ref 4.22–5.81)
RDW: 13.5 % (ref 11.5–15.5)
WBC: 11.3 10*3/uL — ABNORMAL HIGH (ref 4.0–10.5)
nRBC: 0 % (ref 0.0–0.2)

## 2020-08-28 LAB — HEMOGLOBIN A1C
Hgb A1c MFr Bld: 5.7 % — ABNORMAL HIGH (ref 4.8–5.6)
Mean Plasma Glucose: 116.89 mg/dL

## 2020-08-28 LAB — GLUCOSE, CAPILLARY
Glucose-Capillary: 107 mg/dL — ABNORMAL HIGH (ref 70–99)
Glucose-Capillary: 249 mg/dL — ABNORMAL HIGH (ref 70–99)
Glucose-Capillary: 177 mg/dL — ABNORMAL HIGH (ref 70–99)
Glucose-Capillary: 189 mg/dL — ABNORMAL HIGH (ref 70–99)

## 2020-08-28 LAB — HIV ANTIBODY (ROUTINE TESTING W REFLEX): HIV Screen 4th Generation wRfx: NONREACTIVE

## 2020-08-28 MED ORDER — DULOXETINE HCL 30 MG PO CPEP
30.0000 mg | ORAL_CAPSULE | Freq: Every day | ORAL | Status: DC
  Administered 2020-08-28 – 2020-08-29 (×2): 30 mg via ORAL
  Filled 2020-08-28 (×2): qty 1

## 2020-08-28 MED ORDER — JUVEN PO PACK
1.0000 | PACK | Freq: Two times a day (BID) | ORAL | Status: DC
  Administered 2020-08-28 – 2020-08-30 (×2): 1 via ORAL
  Filled 2020-08-28: qty 1

## 2020-08-28 NOTE — H&P (View-Only) (Signed)
  Subjective: Patient has mild right third toe pain.  Objective: Vital signs in last 24 hours: Temp:  [97.8 F (36.6 C)-99.2 F (37.3 C)] 99.2 F (37.3 C) (08/09 0421) Pulse Rate:  [87-105] 88 (08/09 0421) Resp:  [18-20] 18 (08/09 0421) BP: (136-167)/(86-93) 155/92 (08/09 0421) SpO2:  [96 %-100 %] 99 % (08/09 0421) Weight:  [89.8 kg-113.7 kg] 113.7 kg (08/08 1900) Last BM Date: 08/26/20  Intake/Output from previous day: 08/08 0701 - 08/09 0700 In: 561.9 [P.O.:360; IV Piggyback:201.9] Out: -  Intake/Output this shift: No intake/output data recorded.  General appearance: alert, cooperative, and no distress Extremities: Mild decrease in swelling of right third toe.  Open ulceration still present on the distal tuft.  Lab Results:  Recent Labs    08/27/20 1010 08/28/20 0305  WBC 14.0* 11.3*  HGB 13.5 12.6*  HCT 39.3 37.7*  PLT 263 250   BMET Recent Labs    08/27/20 1010 08/28/20 0305  NA 138 142  K 3.4* 3.7  CL 104 104  CO2 27 29  GLUCOSE 146* 121*  BUN 18 24*  CREATININE 0.84 0.97  CALCIUM 9.3 9.1   PT/INR No results for input(s): LABPROT, INR in the last 72 hours.  Studies/Results: DG Foot 2 Views Right  Result Date: 08/27/2020 CLINICAL DATA:  Infected third toe.  Diabetic. EXAM: RIGHT FOOT - 2 VIEW COMPARISON:  06/16/2019 FINDINGS: Interval amputation of the fourth toe. Interval development of lytic destruction and soft tissue destruction of the distal third toe, with lytic change of the distal phalanx and probable pathologic fracture of the middle phalanx. IMPRESSION: Interval amputation of the fourth toe. Lytic destruction of the distal phalanx of the third toe. Pathologic fracture of the middle phalanx of the third toe. Electronically Signed   By: Nelson Chimes M.D.   On: 08/27/2020 10:41    Anti-infectives: Anti-infectives (From admission, onward)    Start     Dose/Rate Route Frequency Ordered Stop   08/28/20 0200  vancomycin (VANCOREADY) IVPB 1250  mg/250 mL        1,250 mg 166.7 mL/hr over 90 Minutes Intravenous Every 12 hours 08/27/20 1413     08/27/20 1600  piperacillin-tazobactam (ZOSYN) IVPB 3.375 g        3.375 g 12.5 mL/hr over 240 Minutes Intravenous Every 8 hours 08/27/20 1347     08/27/20 1315  vancomycin (VANCOCIN) IVPB 1000 mg/200 mL premix        1,000 mg 200 mL/hr over 60 Minutes Intravenous  Once 08/27/20 1301 08/27/20 1629   08/27/20 1015  vancomycin (VANCOCIN) IVPB 1000 mg/200 mL premix        1,000 mg 200 mL/hr over 60 Minutes Intravenous  Once 08/27/20 1007 08/27/20 1209   08/27/20 1015  piperacillin-tazobactam (ZOSYN) IVPB 3.375 g        3.375 g 100 mL/hr over 30 Minutes Intravenous  Once 08/27/20 1007 08/27/20 1055       Assessment/Plan: Impression: Cellulitis with tissue loss, right third toe, diabetes mellitus Plan: We will proceed with right third toe amputation tomorrow.  The risks and benefits of the procedure including bleeding, infection, and wound breakdown were fully explained to the patient, who gave informed consent.  LOS: 1 day    Aviva Signs 08/28/2020

## 2020-08-28 NOTE — Progress Notes (Signed)
PROGRESS NOTE    [name]  [id number] DOB: 12-Jan-1961 DOA: 08/27/2020 PCP: [name], MD   Brief Narrative:   [name] is a 60 y.o. male with medical history significant of DM, hypertension, GERD;  h/o 4 th toe amputation in 2021, presents to ED with severe pain and swelling of the right foot, right leg and gangrenous looking 3 rd toe on the right since one week.  Patient was started on broad-spectrum antibiotics with vancomycin and Zosyn.  General surgery plans for third toe amputation on 8/10.  Assessment & Plan:   Active Problems:   Diabetic foot infection ([place])   Diabetic foot infection/third toe osteomyelitis on right -Continue IV antibiotics with no growth and blood cultures noted -Continue pain control -Plan for amputation of third toe on 8/10 -N.p.o. after midnight  Insulin-dependent diabetes with diabetic neuropathy -Blood glucose currently well controlled -Hemoglobin A1c 5.6% -Continue gabapentin  Hypertension-stable -Continue amlodipine  GERD -Continue PPI   DVT prophylaxis:Lovenox Code Status: Full Family Communication: None at bedside Disposition Plan:  Status is: Inpatient  Remains inpatient appropriate because:IV treatments appropriate due to intensity of illness or inability to take PO and Inpatient level of care appropriate due to severity of illness  Dispo: The patient is from: Home              Anticipated d/c is to: Home              Patient currently is not medically stable to d/c.   Difficult to place patient No   Consultants:  General Surgery  Procedures:  See below  Antimicrobials:  Anti-infectives (From admission, onward)    Start     Dose/Rate Route Frequency Ordered Stop   08/28/20 0200  vancomycin (VANCOREADY) IVPB 1250 mg/250 mL        1,250 mg 166.7 mL/hr over 90 Minutes Intravenous Every 12 hours 08/27/20 1413     08/27/20 1600  piperacillin-tazobactam (ZOSYN) IVPB 3.375 g        3.375 g 12.5  mL/hr over 240 Minutes Intravenous Every 8 hours 08/27/20 1347     08/27/20 1315  vancomycin (VANCOCIN) IVPB 1000 mg/200 mL premix        1,000 mg 200 mL/hr over 60 Minutes Intravenous  Once 08/27/20 1301 08/27/20 1629   08/27/20 1015  vancomycin (VANCOCIN) IVPB 1000 mg/200 mL premix        1,000 mg 200 mL/hr over 60 Minutes Intravenous  Once 08/27/20 1007 08/27/20 1209   08/27/20 1015  piperacillin-tazobactam (ZOSYN) IVPB 3.375 g        3.375 g 100 mL/hr over 30 Minutes Intravenous  Once 08/27/20 1007 08/27/20 1055       Subjective: Patient seen and evaluated today with no new acute complaints or concerns. No acute concerns or events noted overnight.  Objective: Vitals:   08/27/20 1900 08/27/20 2107 08/28/20 0010 08/28/20 0421  BP:  (!) 163/88 136/86 (!) 155/92  Pulse:  (!) 105 96 88  Resp:  '20 20 18  '$ Temp:  97.8 F (36.6 C) 98.5 F (36.9 C) 99.2 F (37.3 C)  TempSrc:  Oral Oral Oral  SpO2:  98% 96% 99%  Weight: 113.7 kg     Height:        Intake/Output Summary (Last 24 hours) at 08/28/2020 1134 Last data filed at 08/27/2020 2300 Gross per 24 hour  Intake 561.89 ml  Output --  Net 561.89 ml   Filed Weights   08/27/20 0953 08/27/20  1900  Weight: 89.8 kg 113.7 kg    Examination:  General exam: Appears calm and comfortable  Respiratory system: Clear to auscultation. Respiratory effort normal. Cardiovascular system: S1 & S2 heard, RRR.  Gastrointestinal system: Abdomen is soft Central nervous system: Alert and awake Extremities: No edema, right third toe with gangrene Skin: No significant lesions noted Psychiatry: Flat affect.    Data Reviewed: I have personally reviewed following labs and imaging studies  CBC: Recent Labs  Lab 08/27/20 1010 08/28/20 0305  WBC 14.0* 11.3*  NEUTROABS 10.4* 7.0  HGB 13.5 12.6*  HCT 39.3 37.7*  MCV 88.9 91.3  PLT 263 [id number]   Basic Metabolic Panel: Recent Labs  Lab 08/27/20 1010 08/28/20 0305  NA 138 142  K 3.4* 3.7   CL 104 104  CO2 27 29  GLUCOSE 146* 121*  BUN 18 24*  CREATININE 0.84 0.97  CALCIUM 9.3 9.1   GFR: Estimated Creatinine Clearance: 103.2 mL/min (by C-G formula based on SCr of 0.97 mg/dL). Liver Function Tests: No results for input(s): AST, ALT, ALKPHOS, BILITOT, PROT, ALBUMIN in the last 168 hours. No results for input(s): LIPASE, AMYLASE in the last 168 hours. No results for input(s): AMMONIA in the last 168 hours. Coagulation Profile: No results for input(s): INR, PROTIME in the last 168 hours. Cardiac Enzymes: No results for input(s): CKTOTAL, CKMB, CKMBINDEX, TROPONINI in the last 168 hours. BNP (last 3 results) No results for input(s): PROBNP in the last 8760 hours. HbA1C: Recent Labs    08/27/20 1011 08/28/20 0305  HGBA1C 5.6 5.7*   CBG: Recent Labs  Lab 08/27/20 1657 08/27/20 2109 08/28/20 0734  GLUCAP 115* 117* 107*   Lipid Profile: No results for input(s): CHOL, HDL, LDLCALC, TRIG, CHOLHDL, LDLDIRECT in the last 72 hours. Thyroid Function Tests: No results for input(s): TSH, T4TOTAL, FREET4, T3FREE, THYROIDAB in the last 72 hours. Anemia Panel: No results for input(s): VITAMINB12, FOLATE, FERRITIN, TIBC, IRON, RETICCTPCT in the last 72 hours. Sepsis Labs: Recent Labs  Lab 08/27/20 1012  LATICACIDVEN 0.9    Recent Results (from the past 240 hour(s))  Culture, blood (routine x 2)     Status: None (Preliminary result)   Collection Time: 08/27/20 10:11 AM   Specimen: BLOOD RIGHT ARM  Result Value Ref Range Status   Specimen Description BLOOD RIGHT ARM  Final   Special Requests   Final    BOTTLES DRAWN AEROBIC AND ANAEROBIC Blood Culture adequate volume Performed at [facility], [address], [place], [place] [address]    Culture PENDING  Incomplete   Report Status PENDING  Incomplete  Culture, blood (routine x 2)     Status: None (Preliminary result)   Collection Time: 08/27/20 10:17 AM   Specimen: Left Antecubital; Blood  Result Value Ref Range  Status   Specimen Description LEFT ANTECUBITAL  Final   Special Requests   Final    BOTTLES DRAWN AEROBIC AND ANAEROBIC Blood Culture adequate volume Performed at [facility], [address], [place], [place] [address]    Culture PENDING  Incomplete   Report Status PENDING  Incomplete         Radiology Studies: DG Foot 2 Views Right  Result Date: 08/27/2020 CLINICAL DATA:  Infected third toe.  Diabetic. EXAM: RIGHT FOOT - 2 VIEW COMPARISON:  06/16/2019 FINDINGS: Interval amputation of the fourth toe. Interval development of lytic destruction and soft tissue destruction of the distal third toe, with lytic change of the distal phalanx and probable pathologic fracture  of the middle phalanx. IMPRESSION: Interval amputation of the fourth toe. Lytic destruction of the distal phalanx of the third toe. Pathologic fracture of the middle phalanx of the third toe. Electronically Signed   By: Nelson Chimes M.D.   On: 08/27/2020 10:41        Scheduled Meds:  ALPRAZolam  0.5 mg Oral BID   amLODipine  5 mg Oral Daily   aspirin EC  81 mg Oral Q breakfast   atorvastatin  20 mg Oral Daily   buPROPion  100 mg Oral BID   enoxaparin (LOVENOX) injection  40 mg Subcutaneous Q24H   feeding supplement  237 mL Oral BID BM   furosemide  20 mg Oral Daily   gabapentin  800 mg Oral TID   insulin aspart  0-15 Units Subcutaneous TID WC   insulin aspart  0-5 Units Subcutaneous QHS   insulin glargine-yfgn  30 Units Subcutaneous QHS   multivitamin with minerals  1 tablet Oral Daily   pantoprazole  40 mg Oral Daily   Continuous Infusions:  piperacillin-tazobactam (ZOSYN)  IV 3.375 g (08/28/20 0853)   vancomycin 1,250 mg (08/28/20 0305)     LOS: 1 day    Time spent: 35 minutes    Donielle Radziewicz Darleen Crocker, DO [facility]  If 7PM-7AM, please contact night-coverage [email or web address] 08/28/2020, 11:34 AM

## 2020-08-28 NOTE — Progress Notes (Signed)
  Subjective: Patient has mild right third toe pain.  Objective: Vital signs in last 24 hours: Temp:  [97.8 F (36.6 C)-99.2 F (37.3 C)] 99.2 F (37.3 C) (08/09 0421) Pulse Rate:  [87-105] 88 (08/09 0421) Resp:  [18-20] 18 (08/09 0421) BP: (136-167)/(86-93) 155/92 (08/09 0421) SpO2:  [96 %-100 %] 99 % (08/09 0421) Weight:  [89.8 kg-113.7 kg] 113.7 kg (08/08 1900) Last BM Date: 08/26/20  Intake/Output from previous day: 08/08 0701 - 08/09 0700 In: 561.9 [P.O.:360; IV Piggyback:201.9] Out: -  Intake/Output this shift: No intake/output data recorded.  General appearance: alert, cooperative, and no distress Extremities: Mild decrease in swelling of right third toe.  Open ulceration still present on the distal tuft.  Lab Results:  Recent Labs    08/27/20 1010 08/28/20 0305  WBC 14.0* 11.3*  HGB 13.5 12.6*  HCT 39.3 37.7*  PLT 263 250   BMET Recent Labs    08/27/20 1010 08/28/20 0305  NA 138 142  K 3.4* 3.7  CL 104 104  CO2 27 29  GLUCOSE 146* 121*  BUN 18 24*  CREATININE 0.84 0.97  CALCIUM 9.3 9.1   PT/INR No results for input(s): LABPROT, INR in the last 72 hours.  Studies/Results: DG Foot 2 Views Right  Result Date: 08/27/2020 CLINICAL DATA:  Infected third toe.  Diabetic. EXAM: RIGHT FOOT - 2 VIEW COMPARISON:  06/16/2019 FINDINGS: Interval amputation of the fourth toe. Interval development of lytic destruction and soft tissue destruction of the distal third toe, with lytic change of the distal phalanx and probable pathologic fracture of the middle phalanx. IMPRESSION: Interval amputation of the fourth toe. Lytic destruction of the distal phalanx of the third toe. Pathologic fracture of the middle phalanx of the third toe. Electronically Signed   By: Nelson Chimes M.D.   On: 08/27/2020 10:41    Anti-infectives: Anti-infectives (From admission, onward)    Start     Dose/Rate Route Frequency Ordered Stop   08/28/20 0200  vancomycin (VANCOREADY) IVPB 1250  mg/250 mL        1,250 mg 166.7 mL/hr over 90 Minutes Intravenous Every 12 hours 08/27/20 1413     08/27/20 1600  piperacillin-tazobactam (ZOSYN) IVPB 3.375 g        3.375 g 12.5 mL/hr over 240 Minutes Intravenous Every 8 hours 08/27/20 1347     08/27/20 1315  vancomycin (VANCOCIN) IVPB 1000 mg/200 mL premix        1,000 mg 200 mL/hr over 60 Minutes Intravenous  Once 08/27/20 1301 08/27/20 1629   08/27/20 1015  vancomycin (VANCOCIN) IVPB 1000 mg/200 mL premix        1,000 mg 200 mL/hr over 60 Minutes Intravenous  Once 08/27/20 1007 08/27/20 1209   08/27/20 1015  piperacillin-tazobactam (ZOSYN) IVPB 3.375 g        3.375 g 100 mL/hr over 30 Minutes Intravenous  Once 08/27/20 1007 08/27/20 1055       Assessment/Plan: Impression: Cellulitis with tissue loss, right third toe, diabetes mellitus Plan: We will proceed with right third toe amputation tomorrow.  The risks and benefits of the procedure including bleeding, infection, and wound breakdown were fully explained to the patient, who gave informed consent.  LOS: 1 day    Aviva Signs 08/28/2020

## 2020-08-28 NOTE — Progress Notes (Addendum)
Initial Nutrition Assessment  DOCUMENTATION CODES:   Obesity unspecified  INTERVENTION:  -Ensure Enlive po BID, due to poor appetite  -1 packet Juven BID, to support wound healing   -Please obtain re-weight. Standing if feasible.  NUTRITION DIAGNOSIS:   Increased nutrient needs related to acute illness (right third toe infection and pending amputation) as evidenced by estimated needs.   GOAL:  Patient will meet greater than or equal to 90% of their needs   MONITOR:  Supplement acceptance, PO intake, Labs, Weight trends, Skin  REASON FOR ASSESSMENT:   Malnutrition Screening Tool    ASSESSMENT: Patient is a 60 yo male presents with right third toe pain. Amputation planned for tomorrow per surgery.   Patient has hx of DM, Arthritis, HTN, GERD, Kidney stones and neuropathy. 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   Patient has poor appetite yesterday per nursing. Today is better. His lunch is here and 100% of salad and fruit with beverage consumed. We talked about increased calorie and protein needs due to inflammation and pending surgery tomorrow. He is agreeable  to supplement nutrition between meals.   Review of weights suggest gain of 20.7 kg (22 %). Expect this result is an error based on patient weight at recent Dr visit- 198 lb. He endorses recent intentional wt loss. He has been working to improve glucose management. On 8/9 A1C-5.7%.   Medications reviewed and include: Lasix, MVI, Insulin, Protonix.  Drips: Zosyn, Vancomycin.  Labs;  BMP Latest Ref Rng & Units 08/28/2020 08/27/2020 10/13/2019  Glucose 70 - 99 mg/dL 121(H) 146(H) 274(H)  BUN 6 - 20 mg/dL 24(H) 18 15  Creatinine 0.61 - 1.24 mg/dL 0.97 0.84 0.77  Sodium 135 - 145 mmol/L 142 138 138  Potassium 3.5 - 5.1 mmol/L 3.7 3.4(L) 4.0  Chloride 98 - 111 mmol/L 104 104 104  CO2 22 - 32 mmol/L '29 27 25  '$ Calcium 8.9 - 10.3 mg/dL 9.1 9.3 8.1(L)      NUTRITION - FOCUSED PHYSICAL EXAM: Nutrition-Focused  physical exam completed. Findings are no fat depletion, no muscle depletion, and mild edema right foot and no edema left extremity.     Diet Order:   Diet Order             Diet Carb Modified Fluid consistency: Thin; Room service appropriate? Yes  Diet effective now                   EDUCATION NEEDS:  Education needs have been addressed  Skin:  Skin Assessment: Skin Integrity Issues: Skin Integrity Issues:: Diabetic Ulcer Diabetic Ulcer: infected right third toe - plans are to amputate on 8/10  Last BM:  8/7  Height:   Ht Readings from Last 1 Encounters:  08/27/20 '5\' 9"'$  (1.753 m)    Weight:   Wt Readings from Last 1 Encounters:  08/27/20 113.7 kg    Ideal Body Weight:   73 kg  BMI:  Body mass index is 37.02 kg/m.  Estimated Nutritional Needs:   Kcal:  [id number]  Protein:  110-120 gr  Fluid:  >2 liters daily  Colman Cater MS,RD,CSG,LDN Contact: Shea Evans

## 2020-08-29 ENCOUNTER — Inpatient Hospital Stay (HOSPITAL_COMMUNITY): Payer: Medicare Other | Admitting: Certified Registered"

## 2020-08-29 ENCOUNTER — Encounter (HOSPITAL_COMMUNITY)
Admission: EM | Disposition: A | Discharge status: Home / Self Care | Payer: Self-pay | Source: Home / Self Care | Attending: Internal Medicine

## 2020-08-29 ENCOUNTER — Encounter (HOSPITAL_COMMUNITY): Payer: Self-pay | Admitting: Internal Medicine

## 2020-08-29 DIAGNOSIS — E1169 Type 2 diabetes mellitus with other specified complication: Secondary | ICD-10-CM | POA: Diagnosis not present

## 2020-08-29 DIAGNOSIS — L03031 Cellulitis of right toe: Secondary | ICD-10-CM | POA: Diagnosis not present

## 2020-08-29 DIAGNOSIS — E1152 Type 2 diabetes mellitus with diabetic peripheral angiopathy with gangrene: Secondary | ICD-10-CM | POA: Diagnosis not present

## 2020-08-29 DIAGNOSIS — M869 Osteomyelitis, unspecified: Secondary | ICD-10-CM | POA: Diagnosis not present

## 2020-08-29 DIAGNOSIS — Z20822 Contact with and (suspected) exposure to covid-19: Secondary | ICD-10-CM | POA: Diagnosis not present

## 2020-08-29 HISTORY — PX: AMPUTATION TOE: SHX6595

## 2020-08-29 LAB — BASIC METABOLIC PANEL
Anion gap: 6 (ref 5–15)
BUN: 22 mg/dL — ABNORMAL HIGH (ref 6–20)
CO2: 26 mmol/L (ref 22–32)
Calcium: 8.1 mg/dL — ABNORMAL LOW (ref 8.9–10.3)
Chloride: 106 mmol/L (ref 98–111)
Creatinine, Ser: 0.98 mg/dL (ref 0.61–1.24)
GFR, Estimated: >60 mL/min (ref >60)
Glucose, Bld: 137 mg/dL — ABNORMAL HIGH (ref 70–99)
Potassium: 3.2 mmol/L — ABNORMAL LOW (ref 3.5–5.1)
Sodium: 138 mmol/L (ref 135–145)

## 2020-08-29 LAB — GLUCOSE, CAPILLARY
Glucose-Capillary: 132 mg/dL — ABNORMAL HIGH (ref 70–99)
Glucose-Capillary: 135 mg/dL — ABNORMAL HIGH (ref 70–99)
Glucose-Capillary: 115 mg/dL — ABNORMAL HIGH (ref 70–99)
Glucose-Capillary: 246 mg/dL — ABNORMAL HIGH (ref 70–99)
Glucose-Capillary: 285 mg/dL — ABNORMAL HIGH (ref 70–99)

## 2020-08-29 LAB — RESP PANEL BY RT-PCR (FLU A&B, COVID) ARPGX2
Influenza A by PCR: NEGATIVE
Influenza B by PCR: NEGATIVE
SARS Coronavirus 2 by RT PCR: NEGATIVE

## 2020-08-29 LAB — CBC
HCT: 34.3 % — ABNORMAL LOW (ref 39.0–52.0)
Hemoglobin: 11.2 g/dL — ABNORMAL LOW (ref 13.0–17.0)
MCH: 29.5 pg (ref 26.0–34.0)
MCHC: 32.7 g/dL (ref 30.0–36.0)
MCV: 90.3 fL (ref 80.0–100.0)
Platelets: 224 10*3/uL (ref 150–400)
RBC: 3.8 MIL/uL — ABNORMAL LOW (ref 4.22–5.81)
RDW: 13.2 % (ref 11.5–15.5)
WBC: 9.4 10*3/uL (ref 4.0–10.5)
nRBC: 0 % (ref 0.0–0.2)

## 2020-08-29 LAB — SURGICAL PCR SCREEN
MRSA, PCR: NEGATIVE
Staphylococcus aureus: POSITIVE — AB

## 2020-08-29 LAB — MAGNESIUM: Magnesium: 1.8 mg/dL (ref 1.7–2.4)

## 2020-08-29 SURGERY — AMPUTATION, TOE
Anesthesia: Regional | Site: Toe | Laterality: Right

## 2020-08-29 MED ORDER — MIDAZOLAM HCL 2 MG/2ML IJ SOLN
INTRAMUSCULAR | Status: AC
  Filled 2020-08-29: qty 2

## 2020-08-29 MED ORDER — POTASSIUM CHLORIDE CRYS ER 20 MEQ PO TBCR
40.0000 meq | EXTENDED_RELEASE_TABLET | Freq: Once | ORAL | Status: AC
  Administered 2020-08-29: 40 meq via ORAL
  Filled 2020-08-29: qty 2

## 2020-08-29 MED ORDER — ONDANSETRON HCL 4 MG/2ML IJ SOLN: 4.0000 mg | Freq: Once | INTRAMUSCULAR | Status: DC | PRN

## 2020-08-29 MED ORDER — BUPIVACAINE HCL (PF) 0.5 % IJ SOLN
INTRAMUSCULAR | Status: AC
  Filled 2020-08-29: qty 30

## 2020-08-29 MED ORDER — ROPIVACAINE HCL 5 MG/ML IJ SOLN
INTRAMUSCULAR | Status: DC | PRN
  Administered 2020-08-29: 20 mL via EPIDURAL

## 2020-08-29 MED ORDER — LIDOCAINE HCL (PF) 2 % IJ SOLN
INTRAMUSCULAR | Status: AC
  Filled 2020-08-29: qty 5

## 2020-08-29 MED ORDER — MUPIROCIN 2 % EX OINT
1.0000 "application " | TOPICAL_OINTMENT | Freq: Two times a day (BID) | CUTANEOUS | Status: DC
  Administered 2020-08-29 – 2020-08-30 (×2): 1 via NASAL
  Filled 2020-08-29: qty 22

## 2020-08-29 MED ORDER — CHLORHEXIDINE GLUCONATE CLOTH 2 % EX PADS: 6.0000 | MEDICATED_PAD | Freq: Once | CUTANEOUS | Status: DC

## 2020-08-29 MED ORDER — KETAMINE HCL 10 MG/ML IJ SOLN
INTRAMUSCULAR | Status: AC
  Filled 2020-08-29: qty 1

## 2020-08-29 MED ORDER — LACTATED RINGERS IV SOLN: INTRAVENOUS | Status: DC

## 2020-08-29 MED ORDER — KETAMINE HCL 10 MG/ML IJ SOLN
INTRAMUSCULAR | Status: DC | PRN
  Administered 2020-08-29: 30 mg via INTRAVENOUS

## 2020-08-29 MED ORDER — DEXAMETHASONE SODIUM PHOSPHATE 4 MG/ML IJ SOLN
INTRAMUSCULAR | Status: AC
  Filled 2020-08-29: qty 1

## 2020-08-29 MED ORDER — HYDROCODONE-ACETAMINOPHEN 7.5-325 MG PO TABS
1.0000 | ORAL_TABLET | Freq: Once | ORAL | Status: DC | PRN
Start: 2020-08-29 — End: 2020-08-29

## 2020-08-29 MED ORDER — POLYETHYLENE GLYCOL 3350 17 G PO PACK
17.0000 g | PACK | Freq: Every day | ORAL | Status: DC | PRN
  Administered 2020-08-29: 17 g via ORAL
  Filled 2020-08-29: qty 1

## 2020-08-29 MED ORDER — FENTANYL CITRATE PF 50 MCG/ML IJ SOSY
PREFILLED_SYRINGE | INTRAMUSCULAR | Status: AC
  Filled 2020-08-29: qty 2

## 2020-08-29 MED ORDER — FENTANYL CITRATE (PF) 100 MCG/2ML IJ SOLN
INTRAMUSCULAR | Status: DC | PRN
  Administered 2020-08-29: 100 ug via INTRAVENOUS

## 2020-08-29 MED ORDER — MEPERIDINE HCL 50 MG/ML IJ SOLN: 6.2500 mg | INTRAMUSCULAR | Status: DC | PRN

## 2020-08-29 MED ORDER — HYDROMORPHONE HCL 1 MG/ML IJ SOLN: 0.2500 mg | INTRAMUSCULAR | Status: DC | PRN

## 2020-08-29 MED ORDER — MIDAZOLAM HCL 2 MG/2ML IJ SOLN
INTRAMUSCULAR | Status: DC | PRN
  Administered 2020-08-29: 2 mg via INTRAVENOUS

## 2020-08-29 MED ORDER — PROPOFOL 10 MG/ML IV BOLUS
INTRAVENOUS | Status: DC | PRN
  Administered 2020-08-29: 75 ug/kg/min via INTRAVENOUS
  Administered 2020-08-29: 70 mg via INTRAVENOUS

## 2020-08-29 MED ORDER — SODIUM CHLORIDE 0.9 % IR SOLN
Status: DC | PRN
  Administered 2020-08-29: 1000 mL

## 2020-08-29 MED ORDER — CHLORHEXIDINE GLUCONATE CLOTH 2 % EX PADS
6.0000 | MEDICATED_PAD | Freq: Every day | CUTANEOUS | Status: DC
Start: 2020-08-29 — End: 2020-08-30
  Administered 2020-08-30: 6 via TOPICAL

## 2020-08-29 MED ORDER — KETOROLAC TROMETHAMINE 30 MG/ML IJ SOLN: 30.0000 mg | Freq: Once | INTRAMUSCULAR | Status: DC | PRN

## 2020-08-29 MED ORDER — PROPOFOL 10 MG/ML IV BOLUS
INTRAVENOUS | Status: AC
  Filled 2020-08-29: qty 40

## 2020-08-29 MED ORDER — ROPIVACAINE HCL 5 MG/ML IJ SOLN
INTRAMUSCULAR | Status: AC
  Filled 2020-08-29: qty 30

## 2020-08-29 MED ORDER — DEXAMETHASONE SODIUM PHOSPHATE 4 MG/ML IJ SOLN
INTRAMUSCULAR | Status: DC | PRN
  Administered 2020-08-29: 4 mg via INTRAVENOUS

## 2020-08-29 SURGICAL SUPPLY — 25 items
BNDG COHESIVE 3X5 TAN STRL LF (GAUZE/BANDAGES/DRESSINGS) ×1 IMPLANT
BNDG CONFORM 3 STRL LF (GAUZE/BANDAGES/DRESSINGS) ×1 IMPLANT
BNDG ELASTIC 4X5.8 VLCR STR LF (GAUZE/BANDAGES/DRESSINGS) ×1 IMPLANT
CLOTH BEACON ORANGE TIMEOUT ST (SAFETY) ×2 IMPLANT
COVER LIGHT HANDLE STERIS (MISCELLANEOUS) ×4 IMPLANT
ELECT REM PT RETURN 9FT ADLT (ELECTROSURGICAL) ×2
ELECTRODE REM PT RTRN 9FT ADLT (ELECTROSURGICAL) ×1 IMPLANT
GAUZE SPONGE 4X4 12PLY STRL (GAUZE/BANDAGES/DRESSINGS) ×2 IMPLANT
GAUZE XEROFORM 1X8 LF (GAUZE/BANDAGES/DRESSINGS) ×1 IMPLANT
GLOVE SURG SS PI 7.5 STRL IVOR (GLOVE) ×2 IMPLANT
GLOVE SURG UNDER POLY LF SZ7 (GLOVE) ×6 IMPLANT
GOWN STRL REUS W/TWL LRG LVL3 (GOWN DISPOSABLE) ×6 IMPLANT
INST SET MINOR BONE (KITS) ×2 IMPLANT
KIT TURNOVER KIT A (KITS) ×2 IMPLANT
MANIFOLD NEPTUNE II (INSTRUMENTS) ×2 IMPLANT
NS IRRIG 1000ML POUR BTL (IV SOLUTION) ×2 IMPLANT
PACK BASIC LIMB (CUSTOM PROCEDURE TRAY) ×2 IMPLANT
PAD ARMBOARD 7.5X6 YLW CONV (MISCELLANEOUS) ×2 IMPLANT
SET BASIN LINEN APH (SET/KITS/TRAYS/PACK) ×2 IMPLANT
SOL PREP PROV IODINE SCRUB 4OZ (MISCELLANEOUS) ×2 IMPLANT
SPONGE GAUZE 2X2 8PLY STRL LF (GAUZE/BANDAGES/DRESSINGS) ×2 IMPLANT
SPONGE T-LAP 18X18 ~~LOC~~+RFID (SPONGE) ×2 IMPLANT
SUT PROLENE 2 0 FS (SUTURE) ×3 IMPLANT
SUT VIC AB 3-0 SH 27 (SUTURE) ×2
SUT VIC AB 3-0 SH 27XBRD (SUTURE) ×1 IMPLANT

## 2020-08-29 NOTE — Anesthesia Postprocedure Evaluation (Signed)
Anesthesia Post Note  Patient: [name]  Procedure(s) Performed: RIGHT THIRD AMPUTATION TOE (Right: Toe)  Patient location during evaluation: PACU Anesthesia Type: Regional Level of consciousness: awake and alert Pain management: pain level controlled Vital Signs Assessment: post-procedure vital signs reviewed and stable Respiratory status: spontaneous breathing, nonlabored ventilation, respiratory function stable and patient connected to nasal cannula oxygen Cardiovascular status: blood pressure returned to baseline and stable Postop Assessment: no apparent nausea or vomiting Anesthetic complications: no   No notable events documented.   Last Vitals:  Vitals:   08/29/20 1146 08/29/20 1200  BP: 138/87 (!) 155/93  Pulse: 86 81  Resp: (!) 30 14  Temp: 36.6 C   SpO2: 95% 98%    Last Pain:  Vitals:   08/29/20 1146  TempSrc:   PainSc: 0-No pain                 Nicanor Alcon

## 2020-08-29 NOTE — Anesthesia Preprocedure Evaluation (Signed)
Anesthesia Evaluation  Patient identified by MRN, date of birth, ID band Patient awake    Reviewed: Allergy & Precautions, H&P , NPO status , Patient's Chart, lab work & pertinent test results  Airway Mallampati: II  TM Distance: >3 FB Neck ROM: Full    Dental no notable dental hx. (+) Teeth Intact, Dental Advisory Given   Pulmonary neg pulmonary ROS, former smoker,    Pulmonary exam normal breath sounds clear to auscultation       Cardiovascular Exercise Tolerance: Good hypertension, Pt. on medications  Rhythm:Regular Rate:Normal     Neuro/Psych  Headaches, Neuropathy negative psych ROS   GI/Hepatic Neg liver ROS, hiatal hernia, PUD, GERD  Medicated,  Endo/Other  diabetes, Insulin Dependent, Oral Hypoglycemic Agents  Renal/GU negative Renal ROS  negative genitourinary   Musculoskeletal  (+) Arthritis , Osteoarthritis,    Abdominal   Peds  Hematology negative hematology ROS (+)   Anesthesia Other Findings Diabetic foot infection   Reproductive/Obstetrics negative OB ROS                             Anesthesia Physical  Anesthesia Plan  ASA: 3  Anesthesia Plan: General and Regional   Post-op Pain Management: GA combined w/ Regional for post-op pain   Induction: Intravenous  PONV Risk Score and Plan: 3 and TIVA  Airway Management Planned:   Additional Equipment:   Intra-op Plan:   Post-operative Plan: Extubation in OR  Informed Consent: I have reviewed the patients History and Physical, chart, labs and discussed the procedure including the risks, benefits and alternatives for the proposed anesthesia with the patient or authorized representative who has indicated his/her understanding and acceptance.     Dental advisory given  Plan Discussed with: CRNA  Anesthesia Plan Comments: (TIVA/block with LMA back up)        Anesthesia Quick Evaluation

## 2020-08-29 NOTE — Progress Notes (Addendum)
PROGRESS NOTE    [name]  [id number] DOB: 1960/01/24 DOA: 08/27/2020 PCP: [name], MD   Brief Narrative:   [name] is a 60 y.o. male with medical history significant of DM, hypertension, GERD;  h/o 4 th toe amputation in 2021, presents to ED with severe pain and swelling of the right foot, right leg and gangrenous looking 3 rd toe on the right since one week.  Patient was started on broad-spectrum antibiotics with vancomycin and Zosyn.  General surgery plans for third toe amputation on 8/10.  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 after midnight  Hypokalemia -Replete orally and recheck labs in a.m.   Insulin-dependent diabetes with diabetic neuropathy -Blood glucose currently well controlled -Hemoglobin A1c 5.6% -Continue gabapentin   Hypertension-stable -Continue amlodipine   GERD -Continue PPI     DVT prophylaxis:Lovenox Code Status: Full Family Communication: None at bedside Disposition Plan:  Status is: Inpatient   Remains inpatient appropriate because:IV treatments appropriate due to intensity of illness or inability to take PO and Inpatient level of care appropriate due to severity of illness   Dispo: The patient is from: Home              Anticipated d/c is to: Home              Patient currently is not medically stable to d/c.              Difficult to place patient No     Consultants:  General Surgery   Procedures:  See below  Antimicrobials:  Anti-infectives (From admission, onward)    Start     Dose/Rate Route Frequency Ordered Stop   08/28/20 0200  vancomycin (VANCOREADY) IVPB 1250 mg/250 mL        1,250 mg 166.7 mL/hr over 90 Minutes Intravenous Every 12 hours 08/27/20 1413      08/27/20 1600  piperacillin-tazobactam (ZOSYN) IVPB 3.375 g        3.375 g 12.5 mL/hr over 240 Minutes Intravenous Every 8 hours 08/27/20 1347     08/27/20 1315  vancomycin (VANCOCIN) IVPB 1000 mg/200 mL premix        1,000 mg 200 mL/hr over 60 Minutes Intravenous  Once 08/27/20 1301 08/27/20 1629   08/27/20 1015  vancomycin (VANCOCIN) IVPB 1000 mg/200 mL premix        1,000 mg 200 mL/hr over 60 Minutes Intravenous  Once 08/27/20 1007 08/27/20 1209   08/27/20 1015  piperacillin-tazobactam (ZOSYN) IVPB 3.375 g        3.375 g 100 mL/hr over 30 Minutes Intravenous  Once 08/27/20 1007 08/27/20 1055       Subjective: Patient seen and evaluated today and is complaining of some more pain to his toe. No acute concerns or events noted overnight.  Objective: Vitals:   08/28/20 1500 08/28/20 2026 08/29/20 0500 08/29/20 0941  BP: (!) 150/90 (!) 155/93 139/84 (!) 171/92  Pulse: 92 (!) 102 99 86  Resp: '18 20 18 10  '$ Temp: 98.6 F (37 C) 99.6 F (37.6 C) 98.2 F (36.8 C) 98.4 F (36.9 C)  TempSrc: Oral Oral Oral Oral  SpO2: 99% 99%  97%  Weight:      Height:       No intake or  output data in the 24 hours ending 08/29/20 0943 Filed Weights   08/27/20 0953 08/27/20 1900  Weight: 89.8 kg 113.7 kg    Examination:  General exam: Appears calm and comfortable  Respiratory system: Clear to auscultation. Respiratory effort normal. Cardiovascular system: S1 & S2 heard, RRR.  Gastrointestinal system: Abdomen is soft Central nervous system: Alert and awake Extremities: No edema, R 3rd toe with gangrene Skin: No significant lesions noted Psychiatry: Flat affect.    Data Reviewed: I have personally reviewed following labs and imaging studies  CBC: Recent Labs  Lab 08/27/20 1010 08/28/20 0305 08/29/20 0522  WBC 14.0* 11.3* 9.4  NEUTROABS 10.4* 7.0  --   HGB 13.5 12.6* 11.2*  HCT 39.3 37.7* 34.3*  MCV 88.9 91.3 90.3  PLT 263 250 [id number]   Basic Metabolic Panel: Recent Labs  Lab  08/27/20 1010 08/28/20 0305 08/29/20 0522  NA 138 142 138  K 3.4* 3.7 3.2*  CL 104 104 106  CO2 '27 29 26  '$ GLUCOSE 146* 121* 137*  BUN 18 24* 22*  CREATININE 0.84 0.97 0.98  CALCIUM 9.3 9.1 8.1*  MG  --   --  1.8   GFR: Estimated Creatinine Clearance: 102.2 mL/min (by C-G formula based on SCr of 0.98 mg/dL). Liver Function Tests: No results for input(s): AST, ALT, ALKPHOS, BILITOT, PROT, ALBUMIN in the last 168 hours. No results for input(s): LIPASE, AMYLASE in the last 168 hours. No results for input(s): AMMONIA in the last 168 hours. Coagulation Profile: No results for input(s): INR, PROTIME in the last 168 hours. Cardiac Enzymes: No results for input(s): CKTOTAL, CKMB, CKMBINDEX, TROPONINI in the last 168 hours. BNP (last 3 results) No results for input(s): PROBNP in the last 8760 hours. HbA1C: Recent Labs    08/27/20 1011 08/28/20 0305  HGBA1C 5.6 5.7*   CBG: Recent Labs  Lab 08/28/20 0734 08/28/20 1207 08/28/20 1657 08/28/20 2030 08/29/20 0808  GLUCAP 107* 249* 177* 189* 132*   Lipid Profile: No results for input(s): CHOL, HDL, LDLCALC, TRIG, CHOLHDL, LDLDIRECT in the last 72 hours. Thyroid Function Tests: No results for input(s): TSH, T4TOTAL, FREET4, T3FREE, THYROIDAB in the last 72 hours. Anemia Panel: No results for input(s): VITAMINB12, FOLATE, FERRITIN, TIBC, IRON, RETICCTPCT in the last 72 hours. Sepsis Labs: Recent Labs  Lab 08/27/20 1012  LATICACIDVEN 0.9    Recent Results (from the past 240 hour(s))  Culture, blood (routine x 2)     Status: None (Preliminary result)   Collection Time: 08/27/20 10:11 AM   Specimen: BLOOD RIGHT ARM  Result Value Ref Range Status   Specimen Description BLOOD RIGHT ARM  Final   Special Requests   Final    BOTTLES DRAWN AEROBIC AND ANAEROBIC Blood Culture adequate volume   Culture   Final    NO GROWTH 2 DAYS Performed at [facility], [address] [place], [place] [address]    Report Status PENDING   Incomplete  Culture, blood (routine x 2)     Status: None (Preliminary result)   Collection Time: 08/27/20 10:17 AM   Specimen: Left Antecubital; Blood  Result Value Ref Range Status   Specimen Description LEFT ANTECUBITAL  Final   Special Requests   Final    BOTTLES DRAWN AEROBIC AND ANAEROBIC Blood Culture adequate volume   Culture   Final    NO GROWTH 2 DAYS Performed at [facility], [address] [place],  [address]    Report Status PENDING  Incomplete  Surgical pcr  screen     Status: Abnormal   Collection Time: 08/29/20  4:00 AM   Specimen: Nasal Mucosa; Nasal Swab  Result Value Ref Range Status   MRSA, PCR NEGATIVE NEGATIVE Final   Staphylococcus aureus POSITIVE (A) NEGATIVE Final    Comment: RESULT CALLED TO, READ BACK BY AND VERIFIED WITH: Cullen,M'@0630'$  by [email or web address],b 8.10.22 (NOTE) The Xpert SA Assay (FDA approved for NASAL specimens in patients 69 years of age and older), is one component of a comprehensive surveillance program. It is not intended to diagnose infection nor to guide or monitor treatment. Performed at [facility], [address] [place], [place] [address]   Resp Panel by RT-PCR (Flu A&B, Covid) Nasal Mucosa     Status: None   Collection Time: 08/29/20  4:02 AM   Specimen: Nasal Mucosa; Nasopharyngeal(NP) swabs in vial transport medium  Result Value Ref Range Status   SARS Coronavirus 2 by RT PCR NEGATIVE NEGATIVE Final    Comment: (NOTE) SARS-CoV-2 target nucleic acids are NOT DETECTED.  The SARS-CoV-2 RNA is generally detectable in upper respiratory specimens during the acute phase of infection. The lowest concentration of SARS-CoV-2 viral copies this assay can detect is 138 copies/mL. A negative result does not preclude SARS-Cov-2 infection and should not be used as the sole basis for treatment or other patient management decisions. A negative result may occur with  improper specimen collection/handling, submission of specimen other than  nasopharyngeal swab, presence of viral mutation(s) within the areas targeted by this assay, and inadequate number of viral copies(<138 copies/mL). A negative result must be combined with clinical observations, patient history, and epidemiological information. The expected result is Negative.  Fact Sheet for Patients:  EntrepreneurPulse.com.au  Fact Sheet for Healthcare Providers:  IncredibleEmployment.be  This test is no t yet approved or cleared by the [place] FDA and  has been authorized for detection and/or diagnosis of SARS-CoV-2 by FDA under an Emergency Use Authorization (EUA). This EUA will remain  in effect (meaning this test can be used) for the duration of the COVID-19 declaration under Section 564(b)(1) of the Act, 21 U.S.C.section 360bbb-3(b)(1), unless the authorization is terminated  or revoked sooner.       Influenza A by PCR NEGATIVE NEGATIVE Final   Influenza B by PCR NEGATIVE NEGATIVE Final    Comment: (NOTE) The Xpert Xpress SARS-CoV-2/FLU/RSV plus assay is intended as an aid in the diagnosis of influenza from Nasopharyngeal swab specimens and should not be used as a sole basis for treatment. Nasal washings and aspirates are unacceptable for Xpert Xpress SARS-CoV-2/FLU/RSV testing.  Fact Sheet for Patients: EntrepreneurPulse.com.au  Fact Sheet for Healthcare Providers: IncredibleEmployment.be  This test is not yet approved or cleared by the [place] FDA and has been authorized for detection and/or diagnosis of SARS-CoV-2 by FDA under an Emergency Use Authorization (EUA). This EUA will remain in effect (meaning this test can be used) for the duration of the COVID-19 declaration under Section 564(b)(1) of the Act, 21 U.S.C. section 360bbb-3(b)(1), unless the authorization is terminated or revoked.  Performed at [facility], [address], [place], [place] [address]           Radiology Studies: DG Foot 2 Views Right  Result Date: 08/27/2020 CLINICAL DATA:  Infected third toe.  Diabetic. EXAM: RIGHT FOOT - 2 VIEW COMPARISON:  06/16/2019 FINDINGS: Interval amputation of the fourth toe. Interval development of lytic destruction and soft tissue destruction of the distal third toe, with lytic change of the distal phalanx  and probable pathologic fracture of the middle phalanx. IMPRESSION: Interval amputation of the fourth toe. Lytic destruction of the distal phalanx of the third toe. Pathologic fracture of the middle phalanx of the third toe. Electronically Signed   By: Nelson Chimes M.D.   On: 08/27/2020 10:41        Scheduled Meds:  ALPRAZolam  0.5 mg Oral BID   amLODipine  5 mg Oral Daily   aspirin EC  81 mg Oral Q breakfast   atorvastatin  20 mg Oral Daily   buPROPion  100 mg Oral BID   Chlorhexidine Gluconate Cloth  6 each Topical Daily   DULoxetine  30 mg Oral QHS   enoxaparin (LOVENOX) injection  40 mg Subcutaneous Q24H   feeding supplement  237 mL Oral BID BM   gabapentin  800 mg Oral TID   insulin aspart  0-15 Units Subcutaneous TID WC   insulin aspart  0-5 Units Subcutaneous QHS   insulin glargine-yfgn  30 Units Subcutaneous QHS   multivitamin with minerals  1 tablet Oral Daily   mupirocin ointment  1 application Nasal BID   nutrition supplement (JUVEN)  1 packet Oral BID BM   pantoprazole  40 mg Oral Daily   Continuous Infusions:  piperacillin-tazobactam (ZOSYN)  IV 3.375 g (08/29/20 0042)   vancomycin 1,250 mg (08/29/20 0043)     LOS: 2 days    Time spent: 35 minutes    Torsten Weniger Darleen Crocker, DO [facility]  If 7PM-7AM, please contact night-coverage [email or web address] 08/29/2020, 9:43 AM

## 2020-08-29 NOTE — Anesthesia Procedure Notes (Signed)
Anesthesia Regional Block: Popliteal block   Pre-Anesthetic Checklist: , timeout performed,  Correct Patient, Correct Site, Correct Laterality,  Correct Procedure, Correct Position, site marked,  Risks and benefits discussed,  Surgical consent,  Pre-op evaluation,  At surgeon's request and post-op pain management  Laterality: Lower and Right  Prep: chloraprep       Needles:  Injection technique: Single-shot  Needle Type: Stimiplex     Needle Length: 4cm  Needle Gauge: 22     Additional Needles:   Procedures:,,,, ultrasound used (permanent image in chart),,    Narrative:  Start time: 08/29/2020 10:32 AM End time: 08/29/2020 10:35 AM  Performed by: Personally

## 2020-08-29 NOTE — Op Note (Signed)
Patient:  [name]  DOB:  07-22-1960  MRN:  [id number]   Preop Diagnosis: Cellulitis of the right third toe, diabetes mellitus  Postop Diagnosis: Same  Procedure: Right third toe amputation  Surgeon: Aviva Signs, MD  Anes: Regional block  Indications: Patient is a 60 year old male who presents with cellulitis with evidence of bony destruction of the right third toe.  He is status post right fourth toe amputation last year.  He does have diabetes mellitus.  Risks and benefits of the procedure including bleeding, infection, wound breakdown, and the possibility of needing further surgery were fully explained to the patient, who gave informed consent.  Procedure note: The patient was placed in the supine position.  The patient had undergone regional block by anesthesia in the preoperative area.  The right foot was prepped and draped using usual sterile technique with Betadine.  Surgical site confirmation was performed.  An elliptical incision was made around the base of the right third toe.  The dissection was taken down to the metatarsal phalangeal joint.  A small amount of purulent fluid was found.  Aerobic and anaerobic cultures were taken and sent to microbiology.  The toe was then amputated and sent to pathology for further examination.  The wound was irrigated with normal saline.  A bleeding was controlled using Bovie electrocautery.  The skin was reapproximated using 2-0 Prolene interrupted sutures.  Xeroform and a dry sterile dressing were applied.  All tape and needle counts were correct at the end of the procedure.  The patient was transferred to PACU in stable condition.  Complications: None  EBL: Minimal  Specimen: Right third toe

## 2020-08-29 NOTE — Transfer of Care (Signed)
Immediate Anesthesia Transfer of Care Note  Patient: [name]  Procedure(s) Performed: RIGHT THIRD AMPUTATION TOE (Right: Toe)  Patient Location: PACU  Anesthesia Type:MAC  Level of Consciousness: awake, alert , oriented and patient cooperative  Airway & Oxygen Therapy: Patient Spontanous Breathing  Post-op Assessment: Report given to RN, Post -op Vital signs reviewed and stable and Patient moving all extremities X 4  Post vital signs: Reviewed and stable  Last Vitals:  Vitals Value Taken Time  BP    Temp    Pulse 86 08/29/20 1145  Resp 30 08/29/20 1145  SpO2 95 % 08/29/20 1145  Vitals shown include unvalidated device data.  Last Pain:  Vitals:   08/29/20 0941  TempSrc: Oral  PainSc: 10-Worst pain ever      Patients Stated Pain Goal: 4 (00/97/94 9971)  Complications: No notable events documented.

## 2020-08-29 NOTE — Interval H&P Note (Signed)
History and Physical Interval Note:  08/29/2020 10:47 AM  [name]  has presented today for surgery, with the diagnosis of cellulitis, right third toe.  The various methods of treatment have been discussed with the patient and family. After consideration of risks, benefits and other options for treatment, the patient has consented to  Procedure(s) with comments: AMPUTATION TOE (Right) - third toe as a surgical intervention.  The patient's history has been reviewed, patient examined, no change in status, stable for surgery.  I have reviewed the patient's chart and labs.  Questions were answered to the patient's satisfaction.     Aviva Signs

## 2020-08-29 NOTE — Progress Notes (Signed)
Popliteal Nerve Block: Time out:1031 Needle inserted:1032 Block administered Needle out: [id number]  Pt remained relaxed and calm during block: receiving 2L of oxygen, vs remains stable

## 2020-08-30 ENCOUNTER — Encounter (HOSPITAL_COMMUNITY): Payer: Self-pay | Admitting: General Surgery

## 2020-08-30 ENCOUNTER — Ambulatory Visit: Payer: Medicaid Other | Admitting: General Surgery

## 2020-08-30 LAB — BASIC METABOLIC PANEL
Anion gap: 8 (ref 5–15)
BUN: 15 mg/dL (ref 6–20)
CO2: 25 mmol/L (ref 22–32)
Calcium: 8.7 mg/dL — ABNORMAL LOW (ref 8.9–10.3)
Chloride: 106 mmol/L (ref 98–111)
Creatinine, Ser: 0.77 mg/dL (ref 0.61–1.24)
GFR, Estimated: >60 mL/min (ref >60)
Glucose, Bld: 212 mg/dL — ABNORMAL HIGH (ref 70–99)
Potassium: 3.8 mmol/L (ref 3.5–5.1)
Sodium: 139 mmol/L (ref 135–145)

## 2020-08-30 LAB — CBC
HCT: 34 % — ABNORMAL LOW (ref 39.0–52.0)
Hemoglobin: 11.4 g/dL — ABNORMAL LOW (ref 13.0–17.0)
MCH: 29.7 pg (ref 26.0–34.0)
MCHC: 33.5 g/dL (ref 30.0–36.0)
MCV: 88.5 fL (ref 80.0–100.0)
Platelets: 251 10*3/uL (ref 150–400)
RBC: 3.84 MIL/uL — ABNORMAL LOW (ref 4.22–5.81)
RDW: 12.9 % (ref 11.5–15.5)
WBC: 9.6 10*3/uL (ref 4.0–10.5)
nRBC: 0 % (ref 0.0–0.2)

## 2020-08-30 LAB — MAGNESIUM: Magnesium: 1.9 mg/dL (ref 1.7–2.4)

## 2020-08-30 LAB — GLUCOSE, CAPILLARY: Glucose-Capillary: 162 mg/dL — ABNORMAL HIGH (ref 70–99)

## 2020-08-30 MED ORDER — SULFAMETHOXAZOLE-TRIMETHOPRIM 800-160 MG PO TABS: 1.0000 | ORAL_TABLET | Freq: Two times a day (BID) | ORAL | 0 refills | Status: DC

## 2020-08-30 NOTE — Progress Notes (Signed)
Discharge instructions given to patient. Patient verbalized understanding of instructions. Patient discharged home with family in stable condition.

## 2020-08-30 NOTE — Discharge Instructions (Signed)
Take right foot dressing off on Saturday 8/13.  Wash daily with soap and water.  Keep weight off ball of right foot, use heel.

## 2020-08-30 NOTE — Discharge Summary (Signed)
Physician Discharge Summary  Patient ID: [name] MRN: [id number] DOB/AGE: 03/26/1960 60 y.o.  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  He also has drainage that developed from the distal tip of the right third toe.  X-rays revealed destruction of the distal tuft.  Patient ultimately underwent a right third toe amputation on 08/29/2020.  Tolerated the surgery well.  His postoperative course has been unremarkable.  His diet was advanced out difficulty.  His blood sugars are improved today.  He is being discharged home on 08/30/2020 in good and improving condition.  Treatments: surgery: Right third toe amputation on 08/29/2020  Discharge Exam: Blood pressure (!) 182/96, pulse 92, temperature 98 F (36.7 C), temperature source Oral, resp. rate 20, height '5\' 9"'$  (1.753 m), weight 113.7 kg, SpO2 100 %. General appearance: alert, cooperative, and no distress Resp: clear to auscultation bilaterally Cardio: regular rate and rhythm, S1, S2 normal, no murmur, click, rub or gallop Extremities: Right foot dressing dry and intact.  Orthopedic sandal in place.  Disposition: Discharge disposition: 01-Home or Self Care       Discharge Instructions     Diet - low sodium heart healthy   Complete by: As directed    Increase activity slowly   Complete by: As directed    Increase activity slowly   Complete by: As directed       Allergies as of 08/30/2020       Reactions   Latex Rash, Other (See Comments)   Redness          Medication List     STOP taking these medications    doxycycline 50 MG capsule Commonly known as: VIBRAMYCIN   Oxycodone HCl 10 MG Tabs       TAKE these medications    acetaminophen 325 MG tablet Commonly known as: TYLENOL Take 2 tablets (650 mg total) by mouth every 4 (four) hours as needed for mild pain or headache.   ALPRAZolam 0.5 MG tablet Commonly known as: XANAX Take 0.5 mg by mouth in the morning and at bedtime.   amLODipine 5 MG tablet Commonly known as: NORVASC Take 5 mg by mouth daily.   aspirin EC 81 MG tablet Take 1 tablet (81 mg total) by mouth daily with breakfast.   atorvastatin 20 MG tablet Commonly known as: LIPITOR Take 20 mg by mouth daily.   buPROPion 300 MG 24 hr tablet Commonly known as: WELLBUTRIN XL Take 300 mg by mouth daily as needed.   diltiazem 420 MG 24 hr capsule Commonly known as: TIAZAC Take 420 mg by mouth daily.   DULoxetine 30 MG capsule Commonly known as: CYMBALTA Take 30 mg by mouth at bedtime.   furosemide 20 MG tablet Commonly known as: LASIX Take 20 mg by mouth daily.   gabapentin 800 MG tablet Commonly known as: NEURONTIN Take 800 mg by mouth 3 (three) times daily.   HYDROcodone-acetaminophen 10-325 MG tablet Commonly known as: NORCO Take 1 tablet by mouth every 6 (six) hours as needed for moderate pain or severe pain.   insulin aspart  100 UNIT/ML FlexPen Commonly known as: NOVOLOG Inject 0-10 Units into the skin 3 (three) times daily with meals. insulin aspart (novoLOG) injection 0-10 Units 0-10 Units Subcutaneous, 3 times daily with meals CBG < 70: Implement Hypoglycemia Standing Orders and refer to Hypoglycemia Standing Orders sidebar report  CBG 70 - 120: 0 unit CBG 121 - 150: 0 unit  CBG 151 - 200: 1 unit CBG 201 - 250: 2 units CBG 251 - 300: 4 units CBG 301 - 350: 6 units  CBG 351 - 400: 8 units  CBG > 400: 10 units   Januvia 100 MG tablet Generic drug: sitaGLIPtin Take 100 mg by mouth daily.   Lantus  SoloStar 100 UNIT/ML Solostar Pen Generic drug: insulin glargine Inject 45 Units into the skin 2 (two) times daily.   metFORMIN 500 MG tablet Commonly known as: GLUCOPHAGE Take 1,000 mg by mouth in the morning and at bedtime. In the morning & 1300   multivitamin with minerals Tabs tablet Take 1 tablet by mouth daily.   Muscle Rub 10-15 % Crea Apply 1 application topically 4 (four) times daily as needed for muscle pain.   naproxen 500 MG tablet Commonly known as: NAPROSYN Take 500 mg by mouth 2 (two) times daily as needed.   pantoprazole 40 MG tablet Commonly known as: PROTONIX TAKE 1 TABLET(40 MG) BY MOUTH DAILY What changed: See the new instructions.   polyethylene glycol 17 g packet Commonly known as: MIRALAX / GLYCOLAX Take 17 g by mouth daily as needed for mild constipation.   sulfamethoxazole-trimethoprim 800-160 MG tablet Commonly known as: BACTRIM DS Take 1 tablet by mouth 2 (two) times daily.   tiZANidine 4 MG tablet Commonly known as: ZANAFLEX Take 1 tablet (4 mg total) by mouth every 8 (eight) hours as needed for muscle spasms.        Follow-up Information     Aviva Signs, MD. Schedule an appointment as soon as possible for a visit on 09/06/2020.   Specialty: General Surgery Contact information: [id number] [phone]                 Signed: Aviva Signs 08/30/2020, 8:26 AM

## 2020-08-31 LAB — SURGICAL PATHOLOGY

## 2020-09-01 LAB — CULTURE, BLOOD (ROUTINE X 2)
Culture: NO GROWTH
Culture: NO GROWTH
Special Requests: ADEQUATE
Special Requests: ADEQUATE

## 2020-09-03 LAB — AEROBIC/ANAEROBIC CULTURE W GRAM STAIN (SURGICAL/DEEP WOUND)

## 2020-09-06 ENCOUNTER — Other Ambulatory Visit: Payer: Self-pay

## 2020-09-06 ENCOUNTER — Ambulatory Visit (INDEPENDENT_AMBULATORY_CARE_PROVIDER_SITE_OTHER): Payer: Medicare Other | Admitting: General Surgery

## 2020-09-06 ENCOUNTER — Encounter: Payer: Self-pay | Admitting: General Surgery

## 2020-09-06 VITALS — BP 130/79 | HR 94 | Temp 98.4°F | Resp 16 | Ht 69.0 in | Wt 195.0 lb

## 2020-09-06 DIAGNOSIS — Z09 Encounter for follow-up examination after completed treatment for conditions other than malignant neoplasm: Secondary | ICD-10-CM

## 2020-09-06 NOTE — Progress Notes (Signed)
Subjective:     [name]  Patient here for postoperative visit, status post right third toe amputation.  He is doing well.  He has only several more days of antibiotics.  No drainage has been noted. Objective:    BP 130/79   Pulse 94   Temp 98.4 F (36.9 C) (Other (Comment))   Resp 16   Ht '5\' 9"'$  (1.753 m)   Wt 195 lb (88.5 kg)   SpO2 94%   BMI 28.80 kg/m   General:  alert, cooperative, and no distress  Right toe amputation site healing well.  Sutures removed.  No erythema or drainage noted.     Assessment:    Doing well postoperatively.    Plan:   Continue heel weightbearing.  Keep wound clean and dry with soap and water.  Finish antibiotics.  Follow-up here in 2 weeks.

## 2020-09-11 ENCOUNTER — Other Ambulatory Visit: Payer: Self-pay | Admitting: Family Medicine

## 2020-09-11 DIAGNOSIS — L03032 Cellulitis of left toe: Secondary | ICD-10-CM

## 2020-09-11 MED ORDER — SULFAMETHOXAZOLE-TRIMETHOPRIM 800-160 MG PO TABS: 1.0000 | ORAL_TABLET | Freq: Two times a day (BID) | ORAL | 0 refills | Status: DC

## 2020-09-20 ENCOUNTER — Encounter: Payer: Medicare Other | Admitting: General Surgery

## 2020-10-23 ENCOUNTER — Encounter: Payer: Medicare Other | Admitting: General Surgery

## 2020-10-24 ENCOUNTER — Other Ambulatory Visit: Payer: Self-pay | Admitting: Student

## 2020-10-24 DIAGNOSIS — M5416 Radiculopathy, lumbar region: Secondary | ICD-10-CM

## 2020-10-30 ENCOUNTER — Other Ambulatory Visit: Payer: Self-pay

## 2020-10-30 ENCOUNTER — Ambulatory Visit
Admission: RE | Admit: 2020-10-30 | Discharge: 2020-10-30 | Disposition: A | Discharge status: Home / Self Care | Payer: Medicare Other | Source: Ambulatory Visit | Attending: Student | Admitting: Student

## 2020-10-30 ENCOUNTER — Encounter: Payer: Medicare Other | Admitting: General Surgery

## 2020-10-30 DIAGNOSIS — M5416 Radiculopathy, lumbar region: Secondary | ICD-10-CM

## 2020-10-30 MED ORDER — IOPAMIDOL (ISOVUE-M 200) INJECTION 41%
20.0000 mL | Freq: Once | INTRAMUSCULAR | Status: AC
  Administered 2020-10-30: 20 mL via INTRATHECAL

## 2020-10-30 MED ORDER — DIAZEPAM 5 MG PO TABS
10.0000 mg | ORAL_TABLET | Freq: Once | ORAL | Status: AC
  Administered 2020-10-30: 10 mg via ORAL

## 2020-10-30 MED ORDER — MEPERIDINE HCL 50 MG/ML IJ SOLN
50.0000 mg | Freq: Once | INTRAMUSCULAR | Status: AC | PRN
  Administered 2020-10-30: 50 mg via INTRAMUSCULAR

## 2020-10-30 MED ORDER — ONDANSETRON HCL 4 MG/2ML IJ SOLN
4.0000 mg | Freq: Once | INTRAMUSCULAR | Status: AC | PRN
  Administered 2020-10-30: 4 mg via INTRAMUSCULAR

## 2020-10-30 NOTE — Discharge Instr - Other Orders (Signed)
1:38 Pt reports pain 6/10 post myelogram procedure, see MAR.

## 2020-10-30 NOTE — Discharge Instructions (Signed)

## 2021-01-16 ENCOUNTER — Telehealth: Payer: Self-pay | Admitting: *Deleted

## 2021-01-16 NOTE — Telephone Encounter (Signed)
Late documentation for 01/15/2021:  Received call from patient [phone]~ telephone.   Patient requested ABTx prescription for possible infection to R foot. States that he was previously seen by Dr. Arnoldo Morale for cellulitis (August 2022)  Reports that R great toe became discolored and developed blister like area under toe. States that blister has since burst and has spread over to the top of toe. States that skin is inflamed, red and warm to touch. Reports that nail has been lost from nail bed due to inflammation.   Advised that patient requires appointment for evaluation ASAP. Appointment scheduled for 01/17/2021 per patient request.   Advised that if patient is unable to keep appointment with provider on 01/17/2021, he should seek care from PCP or at [facility] or ER.

## 2021-01-17 ENCOUNTER — Other Ambulatory Visit: Payer: Self-pay

## 2021-01-17 ENCOUNTER — Encounter: Payer: Self-pay | Admitting: General Surgery

## 2021-01-17 ENCOUNTER — Ambulatory Visit (INDEPENDENT_AMBULATORY_CARE_PROVIDER_SITE_OTHER): Payer: Medicare Other | Admitting: General Surgery

## 2021-01-17 VITALS — BP 132/80 | HR 90 | Temp 98.5°F | Resp 16 | Ht 69.0 in | Wt 200.0 lb

## 2021-01-17 DIAGNOSIS — L03031 Cellulitis of right toe: Secondary | ICD-10-CM | POA: Diagnosis not present

## 2021-01-17 MED ORDER — AMOXICILLIN-POT CLAVULANATE 875-125 MG PO TABS: 1.0000 | ORAL_TABLET | Freq: Two times a day (BID) | ORAL | 0 refills | Status: DC

## 2021-01-18 NOTE — Progress Notes (Signed)
Subjective:     [name]  Patient is a 60 year old white male well-known to me who presents with swelling and drainage from the right great toe.  He is status post multiple toe amputations in the past.  He states this started about 2 weeks ago.  It started draining earlier this week.  He did remove the nail.  He denies any fever or chills. Objective:    BP 132/80    Pulse 90    Temp 98.5 F (36.9 C) (Oral)    Resp 16    Ht 5\' 9"  (1.753 m)    Wt 200 lb (90.7 kg)    SpO2 97%    BMI 29.53 kg/m   General:  alert, cooperative, and no distress  Head is normocephalic, atraumatic Lungs clear to auscultation with good breath sounds bilaterally Heart examination reveals a regular rate and rhythm without S3, S4, murmurs Extremity examination reveals palpable pulses in the right foot.  He is status post right fifth and third toe amputations.  Necrotic soft tissues noted on the distal tuft of the right great toe.  The nail has been removed.  The right great toe is diffusely swollen and erythematous.   Media Information Document Information  Photos    01/17/2021 11:56  Attached To:  Office Visit on 01/17/21 with Aviva Signs, MD   Source Information  Aviva Signs, MD   [facility]        Assessment:    Right great toe cellulitis, diabetes mellitus    Plan:   Patient was told to keep the wound clean and dry with soap and water daily.  He will apply Neosporin ointment.  We will start Augmentin x10 days.  Follow-up here in 2 weeks.  Patient was told that he may lose the right great toe.

## 2021-01-24 ENCOUNTER — Ambulatory Visit: Payer: Medicare Other | Admitting: General Surgery

## 2021-01-26 ENCOUNTER — Encounter (HOSPITAL_COMMUNITY): Payer: Self-pay

## 2021-01-26 ENCOUNTER — Emergency Department (HOSPITAL_COMMUNITY)
Admission: EM | Admit: 2021-01-26 | Discharge: 2021-01-26 | Disposition: A | Discharge status: Home / Self Care | Payer: Medicare Other | Attending: Emergency Medicine | Admitting: Emergency Medicine

## 2021-01-26 ENCOUNTER — Emergency Department (HOSPITAL_COMMUNITY): Payer: Medicare Other

## 2021-01-26 ENCOUNTER — Other Ambulatory Visit: Payer: Self-pay

## 2021-01-26 DIAGNOSIS — Z79899 Other long term (current) drug therapy: Secondary | ICD-10-CM | POA: Insufficient documentation

## 2021-01-26 DIAGNOSIS — Z794 Long term (current) use of insulin: Secondary | ICD-10-CM | POA: Insufficient documentation

## 2021-01-26 DIAGNOSIS — L03115 Cellulitis of right lower limb: Secondary | ICD-10-CM | POA: Diagnosis present

## 2021-01-26 DIAGNOSIS — Z7982 Long term (current) use of aspirin: Secondary | ICD-10-CM | POA: Insufficient documentation

## 2021-01-26 DIAGNOSIS — Z7984 Long term (current) use of oral hypoglycemic drugs: Secondary | ICD-10-CM | POA: Insufficient documentation

## 2021-01-26 DIAGNOSIS — E119 Type 2 diabetes mellitus without complications: Secondary | ICD-10-CM | POA: Diagnosis not present

## 2021-01-26 DIAGNOSIS — L03031 Cellulitis of right toe: Secondary | ICD-10-CM

## 2021-01-26 DIAGNOSIS — Z5189 Encounter for other specified aftercare: Secondary | ICD-10-CM

## 2021-01-26 DIAGNOSIS — Z91040 Latex allergy status: Secondary | ICD-10-CM | POA: Diagnosis not present

## 2021-01-26 LAB — CBC WITH DIFFERENTIAL/PLATELET
Abs Immature Granulocytes: 0.16 10*3/uL — ABNORMAL HIGH (ref 0.00–0.07)
Basophils Absolute: 0.1 10*3/uL (ref 0.0–0.1)
Basophils Relative: 1 %
Eosinophils Absolute: 0 10*3/uL (ref 0.0–0.5)
Eosinophils Relative: 0 %
HCT: 41.1 % (ref 39.0–52.0)
Hemoglobin: 13.6 g/dL (ref 13.0–17.0)
Immature Granulocytes: 2 %
Lymphocytes Relative: 16 %
Lymphs Abs: 1.5 10*3/uL (ref 0.7–4.0)
MCH: 30.1 pg (ref 26.0–34.0)
MCHC: 33.1 g/dL (ref 30.0–36.0)
MCV: 90.9 fL (ref 80.0–100.0)
Monocytes Absolute: 0.6 10*3/uL (ref 0.1–1.0)
Monocytes Relative: 6 %
Neutro Abs: 7.4 10*3/uL (ref 1.7–7.7)
Neutrophils Relative %: 75 %
Platelets: 274 10*3/uL (ref 150–400)
RBC: 4.52 MIL/uL (ref 4.22–5.81)
RDW: 14.1 % (ref 11.5–15.5)
WBC: 9.7 10*3/uL (ref 4.0–10.5)
nRBC: 0 % (ref 0.0–0.2)

## 2021-01-26 LAB — COMPREHENSIVE METABOLIC PANEL
ALT: 33 U/L (ref 0–44)
AST: 27 U/L (ref 15–41)
Albumin: 3.4 g/dL — ABNORMAL LOW (ref 3.5–5.0)
Alkaline Phosphatase: 79 U/L (ref 38–126)
Anion gap: 10 (ref 5–15)
BUN: 19 mg/dL (ref 6–20)
CO2: 26 mmol/L (ref 22–32)
Calcium: 8.8 mg/dL — ABNORMAL LOW (ref 8.9–10.3)
Chloride: 106 mmol/L (ref 98–111)
Creatinine, Ser: 0.92 mg/dL (ref 0.61–1.24)
GFR, Estimated: >60 mL/min (ref >60)
Glucose, Bld: 100 mg/dL — ABNORMAL HIGH (ref 70–99)
Potassium: 3.7 mmol/L (ref 3.5–5.1)
Sodium: 142 mmol/L (ref 135–145)
Total Bilirubin: 0.2 mg/dL — ABNORMAL LOW (ref 0.3–1.2)
Total Protein: 7.2 g/dL (ref 6.5–8.1)

## 2021-01-26 MED ORDER — VANCOMYCIN HCL 1250 MG/250ML IV SOLN: 1250.0000 mg | Freq: Two times a day (BID) | INTRAVENOUS | Status: DC

## 2021-01-26 MED ORDER — VANCOMYCIN HCL 2000 MG/400ML IV SOLN
2000.0000 mg | Freq: Once | INTRAVENOUS | Status: AC
  Administered 2021-01-26: 2000 mg via INTRAVENOUS
  Filled 2021-01-26: qty 400

## 2021-01-26 MED ORDER — AMOXICILLIN-POT CLAVULANATE 875-125 MG PO TABS: 1.0000 | ORAL_TABLET | Freq: Two times a day (BID) | ORAL | 0 refills | Status: AC

## 2021-01-26 NOTE — Discharge Instructions (Addendum)
It was a pleasure taking care of you today!  Your labs did not show infection in the ED.  Your x-ray of your foot did not show infection to the bone.  Maintain your scheduled appointment with Dr. Arnoldo Morale on 01/31/2021.  You will be sent home with an additional 1 week prescription of Augmentin.  Take as prescribed.  Continue with the soaks and recommendations for your foot as per Dr. Arnoldo Morale.  Follow-up with your primary care provider as needed.  Return to the ED if you are experiencing increasing/worsening fever, color change, pain, drainage, worsening symptoms.

## 2021-01-26 NOTE — ED Provider Notes (Signed)
[facility] EMERGENCY DEPARTMENT Provider Note   CSN: [id number] Arrival date & time: 01/26/21  1005     History  Chief Complaint  [name] presents with   Wound Check    [name] is a 61 y.o. male with a past medical history of diabetes who presents to the ED complaining of wound check to right great toe onset prior to arrival.  He reports that he has been having a wound to his right great toe x2 weeks.  He is followed by Dr. Aviva Signs.  [name] had a follow-up appointment with Dr. Arnoldo Morale on 01/25/2020, however [name] canceled that appointment.  He has associated redness to the right great toe. [name] has tried warm soaks, prescription Augmentin, and Neosporin for his symptoms.  Denies fever, chills, nausea, vomiting, gait problem, rash.  Per [name] chart review: [name] was evaluated by Dr. Aviva Signs on 01/17/2022.  At that time he was started on Augmentin and instructed on supportive care measures for his right great toe.  [name] canceled his follow-up appointment on 01/24/2021.  The history is provided by the [name]. No language interpreter was used.      Home Medications Prior to Admission medications   Medication Sig Start Date End Date Taking? Authorizing Provider  ALPRAZolam Duanne Moron) 0.5 MG tablet Take 0.5 mg by mouth in the morning and at bedtime. 02/25/15  Yes [provider]  amLODipine (NORVASC) 5 MG tablet Take 5 mg by mouth daily.   Yes [provider]  amoxicillin-clavulanate (AUGMENTIN) 875-125 MG tablet Take 1 tablet by mouth 2 (two) times daily for 7 days. 01/26/21 02/02/21 Yes Ranay Ketter A, PA-C  aspirin EC 81 MG tablet Take 1 tablet (81 mg total) by mouth daily with breakfast. 06/22/19  Yes Emokpae, Courage, MD  atorvastatin (LIPITOR) 20 MG tablet Take 20 mg by mouth daily. 03/31/19  Yes [provider]  buPROPion (WELLBUTRIN XL) 300 MG 24 hr tablet Take 300 mg by mouth daily. 08/06/20  Yes [provider]  diltiazem  (TIAZAC) 420 MG 24 hr capsule Take 420 mg by mouth daily. 02/20/19  Yes [provider]  DULoxetine (CYMBALTA) 30 MG capsule Take 30 mg by mouth at bedtime. 07/13/20  Yes [provider]  furosemide (LASIX) 20 MG tablet Take 20 mg by mouth daily. 11/25/19  Yes [provider]  gabapentin (NEURONTIN) 800 MG tablet Take 800 mg by mouth 3 (three) times daily. 08/29/19  Yes [provider]  insulin aspart (NOVOLOG) 100 UNIT/ML FlexPen Inject 0-10 Units into the skin 3 (three) times daily with meals. insulin aspart (novoLOG) injection 0-10 Units 0-10 Units Subcutaneous, 3 times daily with meals CBG < 70: Implement Hypoglycemia Standing Orders and refer to Hypoglycemia Standing Orders sidebar report  CBG 70 - 120: 0 unit CBG 121 - 150: 0 unit  CBG 151 - 200: 1 unit CBG 201 - 250: 2 units CBG 251 - 300: 4 units CBG 301 - 350: 6 units  CBG 351 - 400: 8 units  CBG > 400: 10 units 06/22/19  Yes Emokpae, Courage, MD  Menthol-Methyl Salicylate (MUSCLE RUB) 10-15 % CREA Apply 1 application topically 4 (four) times daily as needed for muscle pain.   Yes [provider]  metFORMIN (GLUCOPHAGE) 500 MG tablet Take 1,000 mg by mouth in the morning and at bedtime. In the morning & 1300   Yes [provider]  Multiple Vitamin (MULTIVITAMIN WITH MINERALS) TABS tablet Take 1 tablet by mouth daily.  Yes [provider]  naproxen (NAPROSYN) 500 MG tablet Take 500 mg by mouth 2 (two) times daily as needed. 06/19/20  Yes [provider]  oxyCODONE-acetaminophen (PERCOCET) 7.5-325 MG tablet Take 1 tablet by mouth every 6 (six) hours as needed. 01/15/21  Yes [provider]  pantoprazole (PROTONIX) 40 MG tablet TAKE 1 TABLET(40 MG) BY MOUTH DAILY [name] taking differently: Take 40 mg by mouth daily. 04/30/20  Yes Branch, Alphonse Guild, MD  tiZANidine (ZANAFLEX) 4 MG tablet Take 1 tablet (4 mg total) by mouth every 8 (eight) hours as needed for muscle spasms.  10/13/19  Yes Viona Gilmore D, NP  HYDROcodone-acetaminophen (NORCO) 10-325 MG tablet Take 1 tablet by mouth every 6 (six) hours as needed for moderate pain or severe pain. [name] not taking: Reported on 01/26/2021    [provider]      Allergies    Latex    Review of Systems   Review of Systems  Constitutional:  Negative for chills and fever.  Gastrointestinal:  Negative for nausea and vomiting.  Musculoskeletal:  Negative for gait problem.  Skin:  Positive for color change and wound. Negative for rash.  All other systems reviewed and are negative.  Physical Exam Updated Vital Signs BP 130/75 (BP Location: Right Arm)    Pulse 87    Temp 98.6 F (37 C) (Oral)    Resp 18    Ht 5\' 9"  (1.753 m)    Wt 90.7 kg    SpO2 97%    BMI 29.53 kg/m  Physical Exam Vitals and nursing note reviewed.  Constitutional:      General: He is not in acute distress.    Appearance: He is not diaphoretic.  HENT:     Head: Normocephalic and atraumatic.     Mouth/Throat:     Pharynx: No oropharyngeal exudate.  Eyes:     General: No scleral icterus.    Conjunctiva/sclera: Conjunctivae normal.  Cardiovascular:     Rate and Rhythm: Normal rate and regular rhythm.     Pulses: Normal pulses.     Heart sounds: Normal heart sounds.  Pulmonary:     Effort: Pulmonary effort is normal. No respiratory distress.     Breath sounds: Normal breath sounds. No wheezing.  Abdominal:     General: Bowel sounds are normal.     Palpations: Abdomen is soft. There is no mass.     Tenderness: There is no abdominal tenderness. There is no guarding or rebound.  Musculoskeletal:        General: Normal range of motion.     Cervical back: Normal range of motion and neck supple.     Right foot: Swelling, tenderness and bony tenderness present.     Left foot: Normal.     Comments: Tenderness to palpation to base of right first great MTP.  Erythema noted to base of right first MTP.  Necrosis noted to plantar surface  of right great toe.  No drainage noted from right great toe.  See picture below.  Skin:    General: Skin is warm and dry.  Neurological:     Mental Status: He is alert.  Psychiatric:        Behavior: Behavior normal.         ED Results / Procedures / Treatments   Labs (all labs ordered are listed, but only abnormal results are displayed) Labs Reviewed  CBC WITH DIFFERENTIAL/PLATELET - Abnormal; Notable for the following components:  Result Value   Abs Immature Granulocytes 0.16 (*)    All other components within normal limits  COMPREHENSIVE METABOLIC PANEL - Abnormal; Notable for the following components:   Glucose, Bld 100 (*)    Calcium 8.8 (*)    Albumin 3.4 (*)    Total Bilirubin 0.2 (*)    All other components within normal limits  CULTURE, BLOOD (ROUTINE X 2)  CULTURE, BLOOD (ROUTINE X 2)    EKG None  Radiology DG Foot 2 Views Right  Result Date: 01/26/2021 CLINICAL DATA:  Right great toe pain with open wound. EXAM: RIGHT FOOT - 2 VIEW COMPARISON:  None. FINDINGS: Status post amputation through the third and fourth metatarsophalangeal joints. Soft tissue swelling about the first digit. No cortical erosion or periosteal reaction. Prominent vascular calcifications. No evidence of fracture or dislocation. IMPRESSION: No radiographic evidence of acute osteomyelitis. Evaluation of early osteomyelitis is limited on radiographs, further evaluation with MRI examination is recommended if clinical concern for osteomyelitis. Electronically Signed   By: Keane Police D.O.   On: 01/26/2021 11:24    Procedures Procedures    Medications Ordered in ED Medications  vancomycin (VANCOREADY) IVPB 1250 mg/250 mL (has no administration in time range)  vancomycin (VANCOREADY) IVPB 2000 mg/400 mL (0 mg Intravenous Stopped 01/26/21 1336)    ED Course/ Medical Decision Making/ A&P Clinical Course as of 01/26/21 1406  Sat Jan 26, 2021  1211 [name] reevaluated and notified of x-ray  findings. [SB]  1233 Consult with Dr. Okey Dupre who recommends that the [name] maintain f/u on 01/31/2021. Also recommended an additional week of augmentin Rx. [SB]  1300 [name] reevaluated and notified outpatient discharge treatment plan.  [name] agreeable to discharge treatment plan.  [name] appears safe for discharge at this time. [SB]    Clinical Course User Index [SB] Jo Booze A, PA-C                           Medical Decision Making  [name] with right great toe pain onset 2 weeks.  [name] has a history of diabetes.  [name] is followed by general surgeon, Dr. Arnoldo Morale.  He had a follow-up appointment with Dr. Arnoldo Morale on 01/24/2021 that [name] canceled.  [name] has a scheduled upcoming appointment with Dr. Arnoldo Morale on 01/31/2021.  Vital signs stable, [name] afebrile, not tachycardic, not hypoxic. On exam, [name] with mild tenderness to palpation to base of right great MTP.  Erythema noted to base of right first MTP.  Necrosis noted to the plantar surface of right great toe.  No drainage noted. Differential diagnosis includes osteomyelitis, cellulitis, diabetic foot ulcer, abscess.   Additional history obtained:  External records from outside source obtained and reviewed including: [name] was evaluated by Dr. Aviva Signs on 01/17/2022.  At that time he was started on Augmentin and instructed on supportive care measures for his right great toe.  [name] canceled his follow-up appointment on 01/24/2021.  Labs:  I ordered, and personally interpreted labs.  The pertinent results include: CBC unremarkable.  CMP unremarkable.  Blood cultures ordered with results pending at time of discharge.  Imaging: I ordered imaging studies including right foot x-ray. I independently visualized and interpreted imaging which showed no acute bony erosion.  No fracture or dislocation. I agree with the radiologist interpretation  Medications:  I ordered medication including vancomycin for antibiotic  prophylaxis. I have reviewed the patients home medicines and have made adjustments as needed  Consultations: I requested consultation  with the general surgeon, Dr. Okey Dupre,  and discussed lab and imaging findings as well as pertinent plan - they recommend: Maintain scheduled follow-up appointment with Dr. Arnoldo Morale on 01/31/2021.  As well as prescription for additional week of Augmentin.  Doubt fracture or dislocation.  No drainable abscess in the ED.  Doubt osteomyelitis.  [name] presentation suspicious for cellulitis in the setting of diabetic foot wound and necrosis.  Disposition: [name] presentation suspicious for cellulitis in the setting of diabetic foot wound and necrosis. After consideration of the diagnostic results and the patients response to treatment, I feel that the patent would benefit from discharge home and maintain follow-up appointment with Dr. Arnoldo Morale on 01/31/2021.  We will send 1 week prescription of Augmentin as per general surgeon, Dr Okey Dupre recommendations. Supportive care measures and strict return precautions discussed with [name] at bedside. Pt acknowledges and verbalizes understanding. Pt appears safe for discharge. Follow up as indicated in discharge paperwork.    This chart was dictated using voice recognition software, Dragon. Despite the best efforts of this provider to proofread and correct errors, errors may still occur which can change documentation meaning.   Final Clinical Impression(s) / ED Diagnoses Final diagnoses:  Visit for wound check  Cellulitis of toe of right foot    Rx / DC Orders ED Discharge Orders          Ordered    amoxicillin-clavulanate (AUGMENTIN) 875-125 MG tablet  2 times daily        01/26/21 1308              Belynda Pagaduan A, PA-C 01/26/21 1406    Davonna Belling, MD 01/28/21 [phone]

## 2021-01-26 NOTE — ED Triage Notes (Signed)
Reports wound to right great toe that is not healing despite PO ABT. Patient reports worsening infection.

## 2021-01-26 NOTE — Progress Notes (Signed)
Pharmacy Antibiotic Note  [name] is a 61 y.o. male admitted on 01/26/2021 with cellulitis.  Pharmacy has been consulted for Vancomycin dosing.  Plan: Vancomycin 2000mg  IV loading dose, then 1250 mg IV Q 12 hrs. Goal AUC 400-550. Expected AUC: 440 SCr used: 0.92   Height: 5\' 9"  (175.3 cm) Weight: 90.7 kg (200 lb) IBW/kg (Calculated) : 70.7  Temp (24hrs), Avg:98.6 F (37 C), Min:98.6 F (37 C), Max:98.6 F (37 C)  Recent Labs  Lab 01/26/21 1119  WBC 9.7  CREATININE 0.92    Estimated Creatinine Clearance: 96.2 mL/min (by C-G formula based on SCr of 0.92 mg/dL).    Allergies  Allergen Reactions   Latex Rash and Other (See Comments)    Redness     Antimicrobials this admission: Vancomycin 1/7 >>   Microbiology results: 1/7 BCx: pending  Thank you for allowing pharmacy to be a part of this patients care.  Isac Sarna, BS Pharm D, [place] Clinical Pharmacist Pager [phone] 01/26/2021 1:02 PM

## 2021-01-26 NOTE — Progress Notes (Signed)
Called by ED as patient presented with increasing right large toe pain.  On evaluation, his toe has a necrotic wound and erythema that is similar to findings from previous photodocumentation on 01/17/21.  He was seen by Dr. Arnoldo Morale at that time, and was given a 10-day course of Augmentin for cellulitis.  He had scheduled follow-up with Dr. Arnoldo Morale on 01/29/21, but then had to subsequently reschedule for 01/31/21.  He has no leukocytosis at this time, 9.7.  He also underwent an x-ray of the right foot, which does not demonstrate any acute osteomyelitis at this time.    Patient does not appear to need admission to the hospital at this time.  We will plan to have patient follow-up with Dr. Arnoldo Morale at his scheduled visit on 01/31/21.  We will give the patient an additional 7 days of Augmentin, so he is on antibiotics until his follow-up appointment.  The ED provider will provide patient with instructions to call the office or present to the ED more promptly if he starts to develop fevers, chills, or redness progressing further along his foot.  Please call with any questions or concerns.  Graciella Freer, DO [facility] [address] [place], [place] [address] [phone] (office)

## 2021-01-29 ENCOUNTER — Ambulatory Visit: Payer: Medicare Other | Admitting: General Surgery

## 2021-01-31 ENCOUNTER — Other Ambulatory Visit: Payer: Self-pay

## 2021-01-31 ENCOUNTER — Ambulatory Visit (INDEPENDENT_AMBULATORY_CARE_PROVIDER_SITE_OTHER): Payer: Medicare Other | Admitting: General Surgery

## 2021-01-31 ENCOUNTER — Encounter: Payer: Self-pay | Admitting: General Surgery

## 2021-01-31 VITALS — BP 128/80 | HR 108 | Temp 98.3°F | Resp 12 | Ht 69.0 in | Wt 194.0 lb

## 2021-01-31 DIAGNOSIS — L03031 Cellulitis of right toe: Secondary | ICD-10-CM | POA: Diagnosis not present

## 2021-01-31 NOTE — Progress Notes (Signed)
Subjective:     [name]  Patient here for follow-up of cellulitis of the right great toe.  He was seen in the emergency room several days ago with worsening pain and swelling.  He has since developed dry gangrene of the right great toe.  It has become more difficult to walk. Objective:    BP 128/80    Pulse (!) 108    Temp 98.3 F (36.8 C) (Other (Comment))    Resp 12    Ht 5\' 9"  (1.753 m)    Wt 194 lb (88 kg)    SpO2 95%    BMI 28.65 kg/m   General:  alert, cooperative, and no distress  Right great toe with the distal half with dry gangrene.  No drainage is noted.     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  The risks and benefits of the procedure including bleeding and infection were fully explained to the patient, who gave informed consent.

## 2021-01-31 NOTE — H&P (Signed)
[name] is an 61 y.o. male.   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  He has been treated recently with p.o. antibiotics.  The right great toe has demarcated and now has dry gangrene.  Past Medical History:  Diagnosis Date   Arthritis    Diabetes mellitus    GERD (gastroesophageal reflux disease)    Headache(784.0)    History of kidney stones    Hypertension    Kidney stones    Neuropathy     Past Surgical History:  Procedure Laterality Date   AMPUTATION TOE Right 06/21/2019   Procedure: AMPUTATION TOE;  Surgeon: Aviva Signs, MD;  Location: [facility];  Service: General;  Laterality: Right;   AMPUTATION TOE Right 08/29/2020   Procedure: RIGHT THIRD AMPUTATION TOE;  Surgeon: Aviva Signs, MD;  Location: [facility];  Service: General;  Laterality: Right;  third toe   CARDIAC CATHETERIZATION  2009   CHOLECYSTECTOMY N/A 10/24/2013   Procedure: LAPAROSCOPIC CHOLECYSTECTOMY;  Surgeon: Jamesetta So, MD;  Location: [facility];  Service: General;  Laterality: N/A;   COLONOSCOPY N/A 03/31/2013   Procedure: COLONOSCOPY;  Surgeon: Rogene Houston, MD;  Location: [facility];  Service: Endoscopy;  Laterality: N/A;  8    Family History  Problem Relation Age of Onset   Diabetes Mother    Heart failure Mother    Heart disease Mother    Heart failure Father    Social History:  reports that he has quit smoking. His smoking use included cigarettes. He has never used smokeless tobacco. He reports that he does not drink alcohol and does not use drugs.  Allergies:  Allergies  Allergen Reactions   Latex Rash and Other (See Comments)    Redness     No medications prior to admission.    No results found for this or any previous visit  (from the past 48 hour(s)). No results found.  Review of Systems  Constitutional: Negative.   HENT: Negative.    Eyes: Negative.   Respiratory: Negative.    Cardiovascular: Negative.   Gastrointestinal: Negative.   Endocrine: Negative.   Genitourinary: Negative.   Musculoskeletal:  Positive for gait problem.       Uses a cane  Skin: Negative.   Allergic/Immunologic: Negative.   Hematological: Negative.   Psychiatric/Behavioral: Negative.     There were no vitals taken for this visit. Physical Exam Vitals reviewed.  Constitutional:      Appearance: Normal appearance. He is normal weight. He is not ill-appearing.  HENT:     Head: Normocephalic and atraumatic.  Cardiovascular:     Rate and Rhythm: Normal rate and regular rhythm.     Heart sounds: Normal heart sounds. No murmur heard.   No friction rub. No gallop.  Pulmonary:     Effort: Pulmonary effort is normal.     Breath sounds: Normal breath sounds.  Musculoskeletal:        General: Tenderness present.     Comments: Right great toe with dry gangrene along the tuft of the toe extending up towards the metatarsal phalangeal joint.  No purulent drainage noted.  Dorsalis pedis and posterior tibial pulses intact  Skin:    General: Skin is warm and dry.  Neurological:  Mental Status: He is alert and oriented to person, place, and time.     Assessment/Plan Impression: Dry gangrene of the right great toe, diabetes mellitus, peripheral vascular disease Plan: Patient is scheduled for a right great toe amputation on 02/04/2021.  The risks and benefits of the procedure including bleeding, infection, and wound breakdown were fully explained to the patient, who gave informed consent.  Aviva Signs, MD 01/31/2021, 9:53 AM

## 2021-01-31 NOTE — Patient Instructions (Signed)
[name]  01/31/2021     @PREFPERIOPPHARMACY @   Your procedure is scheduled on  02/04/2021.   Report to [facility] at  [phone]  A.M.   Call this number if you have problems the morning of surgery:  [phone]   Remember:  Do not eat or drink after midnight.       DO NOT take any medications for diabetes the morning of your procedure.    Take these medicines the morning of surgery with A SIP OF WATER           xanax(if needed), amlodipine, wellbutrin, diltiazem, gabapentin, oxycodone (if needed).     Do not wear jewelry, make-up or nail polish.  Do not wear lotions, powders, or perfumes, or deodorant.  Do not shave 48 hours prior to surgery.  Men may shave face and neck.  Do not bring valuables to the hospital.  [facility] is not responsible for any belongings or valuables.  Contacts, dentures or bridgework may not be worn into surgery.  Leave your suitcase in the car.  After surgery it may be brought to your room.  For patients admitted to the hospital, discharge time will be determined by your treatment team.  Patients discharged the day of surgery will not be allowed to drive home and must have someone with them for 24 hours.    Special instructions:   DO NOT smoke tobacco or vape for 24 hours before your procedure.  Please read over the following fact sheets that you were given. Coughing and Deep Breathing, Surgical Site Infection Prevention, Anesthesia Post-op Instructions, and Care and Recovery After Surgery      Phantom Limb Pain Phantom limb pain is pain in a body part that no longer exists. It usually happens in an arm or leg after it has been surgically removed (amputated). Most cases of phantom limb pain are brief. However, it can last for years, and it may be severe and disabling. The exact mechanism of how phantom limb pain occurs is not known. The problem may start in a part of the brain that processes feelings and awareness  (sensations) from the rest of the body (sensory cortex). When a body part is lost, the sensory cortex may not be able to handle the loss and may reorganize (rewire) itself to make up for the lost signals. What are the causes? This condition only happens in patients with amputations, but the cause is not known. It may be caused by: Damaged nerve endings. Scar tissue. Rewiring of nerves in the brain or spine. What are the signs or symptoms? Symptoms vary and are related to the lost limb or the remaining stump. Stump pain may be mistaken for phantom limb pain, or you may have both at the same time. Symptoms include: Phantom pain. Pain often feels like the pain you had before the amputation. It usually comes and goes, and it gets better over time. The pain may feel like: Burning. Stabbing. Throbbing. Cramping. Prickling. Crushing. Telescoping. This is when the pain moves over time from the farthest part of the amputated limb (fingers or toes) up to the site of amputation, as if the limb is shrinking. Phantom sensation. This is a feeling other than pain, as if the limb is still part of the body. Physical or emotional factors can trigger or worsen pain sensations. Those factors may include: Weather changes. Stress. Strong emotions. Certain positions or movements of the body.  Pressure on the affected area. How is this diagnosed? Diagnosis is based on your history of amputation and symptoms that you have after surgery. Your health care provider may: Do a physical exam. Talk with you about your symptoms and past history of pain. You may have imaging tests to examine your stump, such as X-rays or CT scan. How is this treated? There are different therapies and medicines that may give you relief. Treatment options may include: Pain medicine. Medicine can be given for pain right after surgery (acute pain) and for pain that goes on for some time (chronic pain). Commonly used medicines  include: Antidepressant medicine. Anticonvulsant medicine. Narcotics, analgesics, or anti-inflammatory medicine. Nerve blocks. Techniques that help to retrain the brain and nervous system (movement representation techniques), such as: Looking at your unaffected limb in a mirror and thinking about painless movement of your extremity (mirror therapy). Thinking about moving your limbs without actual movement (motor therapy). Watching and sensing the movement of other people (action observation). Attaching a myoelectric sensor on the stump to detect the muscle potential and predict the motion the person wants to perform (virtual reality). Sensory discrimination training. For this treatment, painless stimulation is applied to different parts of your stump and you describe what you feel. This may help with nerve rewiring. Physical therapy involving the stump, which may include: Exercise. This may be physical movement, or it may involve applying sound waves (ultrasound) or tapping (percussion therapy) to the stump. These exercises may help to heal and retrain tissue and nerves. Massage. Stump massage creates new sensations, breaks up scar tissue, and prepares the stump for an artificial limb (prosthesis). Heat or cold treatment. This can improve blood flow and reduce inflammation. Applying painless electrical pulses to the skin to prevent sensations of pain from reaching the brain (transcutaneous electrical nerve stimulation, TENS). Complementary therapies, such as: Acupuncture. Relaxation techniques. These often involve hypnosis, guided imagery, deep breathing, and muscle relaxation exercises. Biofeedback. This involves using monitors that alert you to changes in your breathing, heart rate, skin temperature, or muscle activity, and using relaxation techniques to reverse those changes. Biofeedback tells you if the techniques you are using are effective. Follow these instructions at home:  Take  over-the-counter and prescription medicines only as told by your health care provider. Ask your health care provider if the medicine prescribed to you requires you to avoid driving or using machinery. Do not use any products that contain nicotine or tobacco. These products include cigarettes, chewing tobacco, and vaping devices, such as e-cigarettes. If you need help quitting, ask your health care provider. Join a support group. Express your feelings and talk with someone you trust. Seek counseling or talk therapy with a mental health professional. This may be helpful if you are having trouble managing your emotions about the amputation. Keep all follow-up visits. This is important. Contact a health care provider if: You have a sore on your stump that does not get better with treatment. Your pain does not improve with medicine or treatment. Get help right away if: You have suicidal thoughts. If you ever feel like you may hurt yourself or others, or have thoughts about taking your own life, get help right away. Go to your nearest emergency department or: Call your local emergency services (911 in the [place] Call a suicide crisis helpline, such as the [place] at 442-750-9299 or 988 in the [place]. This is open 24 hours a day in the [place] Text the Crisis Text Line at  035009 (in the [place] Summary Phantom limb pain is pain in a body part that no longer exists. It happens in an arm or leg (extremity) after it has been surgically removed (amputated). Medicines or techniques that help to retrain the brain and nervous system (movement representation techniques) may help to relieve symptoms. Physical therapy for phantom limb pain may involve exercise, massage, heat or cold therapy, or painless stimulation of the skin (transcutaneous electrical nerve stimulation, TENS). This information is not intended to replace advice given to you by your health care provider. Make sure you  discuss any questions you have with your health care provider. Document Revised: 08/01/2020 Document Reviewed: 05/24/2020 Elsevier Patient Education  [place]. Toe Amputation, Care After The following information offers guidance on how to care for yourself after your procedure. Your health care provider may also give you more specific instructions. If you have problems or questions, contact your health care provider. What can I expect after the procedure? After the procedure, it is common to have: Soreness or pain. Pain usually improves within a week. Slight redness or swelling at the incision site. Follow these instructions at home: Medicines Take over-the-counter and prescription medicines only as told by your health care provider. If you were prescribed an antibiotic medicine, take it as told by your health care provider. Do not stop taking the antibiotic even if you start to feel better. Ask your health care provider if the medicine prescribed to you: Requires you to avoid driving or using heavy machinery. Can cause constipation. You may need to take actions to prevent or treat constipation, such as: Drink enough fluid to keep your urine pale yellow. Take over-the-counter or prescription medicines. Eat foods that are high in fiber, such as beans, whole grains, and fresh fruits and vegetables. Limit foods that are high in fat and processed sugars, such as fried or sweet foods. Managing pain, stiffness, and swelling  If directed, put ice on the affected area. To do this: Put ice in a plastic bag. Place a towel between your skin and the bag. Leave the ice on for 20 minutes, 2-3 times a day. Remove the ice if your skin turns bright red. This is very important. If you cannot feel pain, heat, or cold, you have a greater risk of damage to the area. Move your other toes often to reduce stiffness and swelling. Raise (elevate) the injured area above the level of your heart while you  are sitting or lying down. Incision care   Follow instructions from your health care provider about how to take care of your incision. Make sure you: Wash your hands with soap and water for at least 20 seconds before and after you change your bandage (dressing). If soap and water are not available, use hand sanitizer. Change your dressing as told by your health care provider. Leave stitches (sutures), skin glue, or adhesive strips in place. These skin closures may need to stay in place for 2 weeks or longer. If adhesive strip edges start to loosen and curl up, you may trim the loose edges. Do not remove adhesive strips completely unless your health care provider tells you to do that. Check your incision area every day for signs of infection. Check for: More redness, swelling, or pain. Fluid or blood. Warmth. Pus or a bad smell. Bathing Do not take baths, swim, or use a hot tub until your health care provider approves. Ask your health care provider if you may take showers. You may  only be allowed to take sponge baths. Do not soak your foot until your health care provider approves. If your dressing has been removed and is no longer needed, you may wash your skin with warm water and soap. Do not scrub in the area where you had your surgery (surgical site). Activity Rest as told by your health care provider. Avoid sitting for a long time without moving. Get up to take short walks every 1-2 hours. This is important to improve blood flow and breathing. Ask for help if you feel weak or unsteady. Do not use the injured limb to support your body weight until your health care provider says that you can. Use crutches as told by your health care provider. Do exercises as told by your health care provider. Return to your normal activities as told by your health care provider. Ask your health care provider what activities are safe for you. General instructions If you were given a sedative during the  procedure, it can affect you for several hours. Do not drive or operate machinery until your health care provider says that it is safe. Ask your health care provider when it is safe to drive if you have a dressing on your foot. Do not use any products that contain nicotine or tobacco. These products include cigarettes, chewing tobacco, and vaping devices, such as e-cigarettes. If you need help quitting, ask your health care provider. Ask your health care provider about wearing supportive shoes or using inserts to help with your balance when walking. If you have diabetes, keep your blood sugar under good control and check your feet daily for sores or open areas. Keep all follow-up visits. This is important. Contact a health care provider if: You have signs of infection at your incision, such as: Redness, swelling, or pain. Fluid or blood. Warmth. Pus or a bad smell. You have a fever or chills. Your dressing is soaked with blood. Your sutures tear or they separate. Your pain does not improve after you take your medicine. Get help right away if: You have red streaks on your skin near your toes, foot, or leg. You have pain in your calf or behind your knee. You feel extreme pain, numbness, weakness, loss of a pulse, or your foot feels cold to touch. This can be a sign of a medical emergency called compartment syndrome. You have chest pain or shortness of breath. These symptoms may be an emergency. Get help right away. Call 911. Do not wait to see if the symptoms will go away. Do not drive yourself to the hospital. Summary After the procedure, it is common to have some pain. Pain usually improves within a week. If you were prescribed an antibiotic medicine, take it as told by your health care provider. Do not stop taking the antibiotic even if you start to feel better. Change your dressing as told by your health care provider. Get help right away if you have red streaks on your skin or develop  signs of compartment syndrome. Keep all follow-up visits. This is important. This information is not intended to replace advice given to you by your health care provider. Make sure you discuss any questions you have with your health care provider. Document Revised: 08/20/2020 Document Reviewed: 08/20/2020 Elsevier Patient Education  2022 [place] Anesthesia, Adult, Care After This sheet gives you information about how to care for yourself after your procedure. Your health care provider may also give you more specific instructions. If you have problems  or questions, contact your health care provider. What can I expect after the procedure? After the procedure, the following side effects are common: Pain or discomfort at the IV site. Nausea. Vomiting. Sore throat. Trouble concentrating. Feeling cold or chills. Feeling weak or tired. Sleepiness and fatigue. Soreness and body aches. These side effects can affect parts of the body that were not involved in surgery. Follow these instructions at home: For the time period you were told by your health care provider:  Rest. Do not participate in activities where you could fall or become injured. Do not drive or use machinery. Do not drink alcohol. Do not take sleeping pills or medicines that cause drowsiness. Do not make important decisions or sign legal documents. Do not take care of children on your own. Eating and drinking Follow any instructions from your health care provider about eating or drinking restrictions. When you feel hungry, start by eating small amounts of foods that are soft and easy to digest (bland), such as toast. Gradually return to your regular diet. Drink enough fluid to keep your urine pale yellow. If you vomit, rehydrate by drinking water, juice, or clear broth. General instructions If you have sleep apnea, surgery and certain medicines can increase your risk for breathing problems. Follow instructions  from your health care provider about wearing your sleep device: Anytime you are sleeping, including during daytime naps. While taking prescription pain medicines, sleeping medicines, or medicines that make you drowsy. Have a responsible adult stay with you for the time you are told. It is important to have someone help care for you until you are awake and alert. Return to your normal activities as told by your health care provider. Ask your health care provider what activities are safe for you. Take over-the-counter and prescription medicines only as told by your health care provider. If you smoke, do not smoke without supervision. Keep all follow-up visits as told by your health care provider. This is important. Contact a health care provider if: You have nausea or vomiting that does not get better with medicine. You cannot eat or drink without vomiting. You have pain that does not get better with medicine. You are unable to pass urine. You develop a skin rash. You have a fever. You have redness around your IV site that gets worse. Get help right away if: You have difficulty breathing. You have chest pain. You have blood in your urine or stool, or you vomit blood. Summary After the procedure, it is common to have a sore throat or nausea. It is also common to feel tired. Have a responsible adult stay with you for the time you are told. It is important to have someone help care for you until you are awake and alert. When you feel hungry, start by eating small amounts of foods that are soft and easy to digest (bland), such as toast. Gradually return to your regular diet. Drink enough fluid to keep your urine pale yellow. Return to your normal activities as told by your health care provider. Ask your health care provider what activities are safe for you. This information is not intended to replace advice given to you by your health care provider. Make sure you discuss any questions you have  with your health care provider. Document Revised: 09/22/2019 Document Reviewed: 04/21/2019 Elsevier Patient Education  2022 [place]. How to Use Chlorhexidine for Bathing Chlorhexidine gluconate (CHG) is a germ-killing (antiseptic) solution that is used to clean the skin. It can get  rid of the bacteria that normally live on the skin and can keep them away for about 24 hours. To clean your skin with CHG, you may be given: A CHG solution to use in the shower or as part of a sponge bath. A prepackaged cloth that contains CHG. Cleaning your skin with CHG may help lower the risk for infection: While you are staying in the intensive care unit of the hospital. If you have a vascular access, such as a central line, to provide short-term or long-term access to your veins. If you have a catheter to drain urine from your bladder. If you are on a ventilator. A ventilator is a machine that helps you breathe by moving air in and out of your lungs. After surgery. What are the risks? Risks of using CHG include: A skin reaction. Hearing loss, if CHG gets in your ears and you have a perforated eardrum. Eye injury, if CHG gets in your eyes and is not rinsed out. The CHG product catching fire. Make sure that you avoid smoking and flames after applying CHG to your skin. Do not use CHG: If you have a chlorhexidine allergy or have previously reacted to chlorhexidine. On babies younger than 6 months of age. How to use CHG solution Use CHG only as told by your health care provider, and follow the instructions on the label. Use the full amount of CHG as directed. Usually, this is one bottle. During a shower Follow these steps when using CHG solution during a shower (unless your health care provider gives you different instructions): Start the shower. Use your normal soap and shampoo to wash your face and hair. Turn off the shower or move out of the shower stream. Pour the CHG onto a clean washcloth. Do not  use any type of brush or rough-edged sponge. Starting at your neck, lather your body down to your toes. Make sure you follow these instructions: If you will be having surgery, pay special attention to the part of your body where you will be having surgery. Scrub this area for at least 1 minute. Do not use CHG on your head or face. If the solution gets into your ears or eyes, rinse them well with water. Avoid your genital area. Avoid any areas of skin that have broken skin, cuts, or scrapes. Scrub your back and under your arms. Make sure to wash skin folds. Let the lather sit on your skin for 1-2 minutes or as long as told by your health care provider. Thoroughly rinse your entire body in the shower. Make sure that all body creases and crevices are rinsed well. Dry off with a clean towel. Do not put any substances on your body afterward--such as powder, lotion, or perfume--unless you are told to do so by your health care provider. Only use lotions that are recommended by the manufacturer. Put on clean clothes or pajamas. If it is the night before your surgery, sleep in clean sheets.  During a sponge bath Follow these steps when using CHG solution during a sponge bath (unless your health care provider gives you different instructions): Use your normal soap and shampoo to wash your face and hair. Pour the CHG onto a clean washcloth. Starting at your neck, lather your body down to your toes. Make sure you follow these instructions: If you will be having surgery, pay special attention to the part of your body where you will be having surgery. Scrub this area for at least 1 minute. Do  not use CHG on your head or face. If the solution gets into your ears or eyes, rinse them well with water. Avoid your genital area. Avoid any areas of skin that have broken skin, cuts, or scrapes. Scrub your back and under your arms. Make sure to wash skin folds. Let the lather sit on your skin for 1-2 minutes or as  long as told by your health care provider. Using a different clean, wet washcloth, thoroughly rinse your entire body. Make sure that all body creases and crevices are rinsed well. Dry off with a clean towel. Do not put any substances on your body afterward--such as powder, lotion, or perfume--unless you are told to do so by your health care provider. Only use lotions that are recommended by the manufacturer. Put on clean clothes or pajamas. If it is the night before your surgery, sleep in clean sheets. How to use CHG prepackaged cloths Only use CHG cloths as told by your health care provider, and follow the instructions on the label. Use the CHG cloth on clean, dry skin. Do not use the CHG cloth on your head or face unless your health care provider tells you to. When washing with the CHG cloth: Avoid your genital area. Avoid any areas of skin that have broken skin, cuts, or scrapes. Before surgery Follow these steps when using a CHG cloth to clean before surgery (unless your health care provider gives you different instructions): Using the CHG cloth, vigorously scrub the part of your body where you will be having surgery. Scrub using a back-and-forth motion for 3 minutes. The area on your body should be completely wet with CHG when you are done scrubbing. Do not rinse. Discard the cloth and let the area air-dry. Do not put any substances on the area afterward, such as powder, lotion, or perfume. Put on clean clothes or pajamas. If it is the night before your surgery, sleep in clean sheets.  For general bathing Follow these steps when using CHG cloths for general bathing (unless your health care provider gives you different instructions). Use a separate CHG cloth for each area of your body. Make sure you wash between any folds of skin and between your fingers and toes. Wash your body in the following order, switching to a new cloth after each step: The front of your neck, shoulders, and  chest. Both of your arms, under your arms, and your hands. Your stomach and groin area, avoiding the genitals. Your right leg and foot. Your left leg and foot. The back of your neck, your back, and your buttocks. Do not rinse. Discard the cloth and let the area air-dry. Do not put any substances on your body afterward--such as powder, lotion, or perfume--unless you are told to do so by your health care provider. Only use lotions that are recommended by the manufacturer. Put on clean clothes or pajamas. Contact a health care provider if: Your skin gets irritated after scrubbing. You have questions about using your solution or cloth. You swallow any chlorhexidine. Call your local poison control center (1-507-817-9620 in the [place] Get help right away if: Your eyes itch badly, or they become very red or swollen. Your skin itches badly and is red or swollen. Your hearing changes. You have trouble seeing. You have swelling or tingling in your mouth or throat. You have trouble breathing. These symptoms may represent a serious problem that is an emergency. Do not wait to see if the symptoms will go away. Get medical  help right away. Call your local emergency services (911 in the [place] Do not drive yourself to the hospital. Summary Chlorhexidine gluconate (CHG) is a germ-killing (antiseptic) solution that is used to clean the skin. Cleaning your skin with CHG may help to lower your risk for infection. You may be given CHG to use for bathing. It may be in a bottle or in a prepackaged cloth to use on your skin. Carefully follow your health care provider's instructions and the instructions on the product label. Do not use CHG if you have a chlorhexidine allergy. Contact your health care provider if your skin gets irritated after scrubbing. This information is not intended to replace advice given to you by your health care provider. Make sure you discuss any questions you have with your health care  provider. Document Revised: 03/19/2020 Document Reviewed: 03/19/2020 Elsevier Patient Education  2022 [employer].

## 2021-02-01 ENCOUNTER — Encounter (HOSPITAL_COMMUNITY): Payer: Self-pay

## 2021-02-01 ENCOUNTER — Encounter (HOSPITAL_COMMUNITY)
Admission: RE | Admit: 2021-02-01 | Discharge: 2021-02-01 | Disposition: A | Discharge status: Home / Self Care | Payer: Medicare Other | Source: Ambulatory Visit | Attending: General Surgery | Admitting: General Surgery

## 2021-02-01 ENCOUNTER — Other Ambulatory Visit (HOSPITAL_COMMUNITY)
Admission: RE | Admit: 2021-02-01 | Discharge: 2021-02-01 | Disposition: A | Discharge status: Home / Self Care | Payer: Medicare Other | Source: Ambulatory Visit | Attending: General Surgery | Admitting: General Surgery

## 2021-02-01 VITALS — BP 135/79 | HR 105 | Temp 97.1°F | Ht 69.0 in | Wt 194.0 lb

## 2021-02-01 DIAGNOSIS — Z794 Long term (current) use of insulin: Secondary | ICD-10-CM | POA: Diagnosis not present

## 2021-02-01 DIAGNOSIS — U071 COVID-19: Secondary | ICD-10-CM | POA: Insufficient documentation

## 2021-02-01 DIAGNOSIS — Z01818 Encounter for other preprocedural examination: Secondary | ICD-10-CM | POA: Diagnosis present

## 2021-02-01 DIAGNOSIS — E1165 Type 2 diabetes mellitus with hyperglycemia: Secondary | ICD-10-CM | POA: Diagnosis not present

## 2021-02-01 LAB — CULTURE, BLOOD (ROUTINE X 2)
Culture: NO GROWTH
Culture: NO GROWTH
Special Requests: ADEQUATE
Special Requests: ADEQUATE

## 2021-02-01 LAB — HEMOGLOBIN A1C
Hgb A1c MFr Bld: 6.1 % — ABNORMAL HIGH (ref 4.8–5.6)
Mean Plasma Glucose: 128.37 mg/dL

## 2021-02-02 LAB — SARS CORONAVIRUS 2 (TAT 6-24 HRS): SARS Coronavirus 2: POSITIVE — AB

## 2021-02-13 NOTE — Patient Instructions (Signed)
Your procedure is scheduled on: 02/20/21  Report to [facility] at    7:40 AM.  Call this number if you have problems the morning of surgery: [phone]   Remember:   Do not Eat or Drink after midnight         No Smoking the morning of surgery  :  Take these medicines the morning of surgery with A SIP OF WATER: Amlodipine, xanax, Wellbutrin, Diltiazem, Cymbalta, oxycodone, pantoprazole, and zanflex  No diabetic medications morning of procedure   Do not wear jewelry, make-up or nail polish.  Do not wear lotions, powders, or perfumes. You may wear deodorant.  Do not shave 48 hours prior to surgery. Men may shave face and neck.  Do not bring valuables to the hospital.  Contacts, dentures or bridgework may not be worn into surgery.  Leave suitcase in the car. After surgery it may be brought to your room.  For patients admitted to the hospital, checkout time is 11:00 AM the day of discharge.   Patients discharged the day of surgery will not be allowed to drive home.    Special Instructions: Shower using CHG night before surgery and shower the day of surgery use CHG.  Use special wash - you have one bottle of CHG for all showers.  You should use approximately 1/2 of the bottle for each shower.  How to Use Chlorhexidine for Bathing Chlorhexidine gluconate (CHG) is a germ-killing (antiseptic) solution that is used to clean the skin. It can get rid of the bacteria that normally live on the skin and can keep them away for about 24 hours. To clean your skin with CHG, you may be given: A CHG solution to use in the shower or as part of a sponge bath. A prepackaged cloth that contains CHG. Cleaning your skin with CHG may help lower the risk for infection: While you are staying in the intensive care unit of the hospital. If you have a vascular access, such as a central line, to provide short-term or long-term access to your veins. If you have a catheter to drain urine from your  bladder. If you are on a ventilator. A ventilator is a machine that helps you breathe by moving air in and out of your lungs. After surgery. What are the risks? Risks of using CHG include: A skin reaction. Hearing loss, if CHG gets in your ears and you have a perforated eardrum. Eye injury, if CHG gets in your eyes and is not rinsed out. The CHG product catching fire. Make sure that you avoid smoking and flames after applying CHG to your skin. Do not use CHG: If you have a chlorhexidine allergy or have previously reacted to chlorhexidine. On babies younger than 45 months of age. How to use CHG solution Use CHG only as told by your health care provider, and follow the instructions on the label. Use the full amount of CHG as directed. Usually, this is one bottle. During a shower Follow these steps when using CHG solution during a shower (unless your health care provider gives you different instructions): Start the shower. Use your normal soap and shampoo to wash your face and hair. Turn off the shower or move out of the shower stream. Pour the CHG onto a clean washcloth. Do not use any type of brush or rough-edged sponge. Starting at your neck, lather your body down to your toes. Make sure you follow these instructions: If you will be having surgery, pay  special attention to the part of your body where you will be having surgery. Scrub this area for at least 1 minute. Do not use CHG on your head or face. If the solution gets into your ears or eyes, rinse them well with water. Avoid your genital area. Avoid any areas of skin that have broken skin, cuts, or scrapes. Scrub your back and under your arms. Make sure to wash skin folds. Let the lather sit on your skin for 1-2 minutes or as long as told by your health care provider. Thoroughly rinse your entire body in the shower. Make sure that all body creases and crevices are rinsed well. Dry off with a clean towel. Do not put any substances on  your body afterward--such as powder, lotion, or perfume--unless you are told to do so by your health care provider. Only use lotions that are recommended by the manufacturer. Put on clean clothes or pajamas. If it is the night before your surgery, sleep in clean sheets.  During a sponge bath Follow these steps when using CHG solution during a sponge bath (unless your health care provider gives you different instructions): Use your normal soap and shampoo to wash your face and hair. Pour the CHG onto a clean washcloth. Starting at your neck, lather your body down to your toes. Make sure you follow these instructions: If you will be having surgery, pay special attention to the part of your body where you will be having surgery. Scrub this area for at least 1 minute. Do not use CHG on your head or face. If the solution gets into your ears or eyes, rinse them well with water. Avoid your genital area. Avoid any areas of skin that have broken skin, cuts, or scrapes. Scrub your back and under your arms. Make sure to wash skin folds. Let the lather sit on your skin for 1-2 minutes or as long as told by your health care provider. Using a different clean, wet washcloth, thoroughly rinse your entire body. Make sure that all body creases and crevices are rinsed well. Dry off with a clean towel. Do not put any substances on your body afterward--such as powder, lotion, or perfume--unless you are told to do so by your health care provider. Only use lotions that are recommended by the manufacturer. Put on clean clothes or pajamas. If it is the night before your surgery, sleep in clean sheets. How to use CHG prepackaged cloths Only use CHG cloths as told by your health care provider, and follow the instructions on the label. Use the CHG cloth on clean, dry skin. Do not use the CHG cloth on your head or face unless your health care provider tells you to. When washing with the CHG cloth: Avoid your genital  area. Avoid any areas of skin that have broken skin, cuts, or scrapes. Before surgery Follow these steps when using a CHG cloth to clean before surgery (unless your health care provider gives you different instructions): Using the CHG cloth, vigorously scrub the part of your body where you will be having surgery. Scrub using a back-and-forth motion for 3 minutes. The area on your body should be completely wet with CHG when you are done scrubbing. Do not rinse. Discard the cloth and let the area air-dry. Do not put any substances on the area afterward, such as powder, lotion, or perfume. Put on clean clothes or pajamas. If it is the night before your surgery, sleep in clean sheets.  For general bathing  Follow these steps when using CHG cloths for general bathing (unless your health care provider gives you different instructions). Use a separate CHG cloth for each area of your body. Make sure you wash between any folds of skin and between your fingers and toes. Wash your body in the following order, switching to a new cloth after each step: The front of your neck, shoulders, and chest. Both of your arms, under your arms, and your hands. Your stomach and groin area, avoiding the genitals. Your right leg and foot. Your left leg and foot. The back of your neck, your back, and your buttocks. Do not rinse. Discard the cloth and let the area air-dry. Do not put any substances on your body afterward--such as powder, lotion, or perfume--unless you are told to do so by your health care provider. Only use lotions that are recommended by the manufacturer. Put on clean clothes or pajamas. Contact a health care provider if: Your skin gets irritated after scrubbing. You have questions about using your solution or cloth. You swallow any chlorhexidine. Call your local poison control center (1-470-488-8067 in the [place] Get help right away if: Your eyes itch badly, or they become very red or swollen. Your  skin itches badly and is red or swollen. Your hearing changes. You have trouble seeing. You have swelling or tingling in your mouth or throat. You have trouble breathing. These symptoms may represent a serious problem that is an emergency. Do not wait to see if the symptoms will go away. Get medical help right away. Call your local emergency services (911 in the [place] Do not drive yourself to the hospital. Summary Chlorhexidine gluconate (CHG) is a germ-killing (antiseptic) solution that is used to clean the skin. Cleaning your skin with CHG may help to lower your risk for infection. You may be given CHG to use for bathing. It may be in a bottle or in a prepackaged cloth to use on your skin. Carefully follow your health care provider's instructions and the instructions on the product label. Do not use CHG if you have a chlorhexidine allergy. Contact your health care provider if your skin gets irritated after scrubbing. This information is not intended to replace advice given to you by your health care provider. Make sure you discuss any questions you have with your health care provider. Document Revised: 03/19/2020 Document Reviewed: 03/19/2020 Elsevier Patient Education  2022 [employer]. Toe Amputation, Care After The following information offers guidance on how to care for yourself after your procedure. Your health care provider may also give you more specific instructions. If you have problems or questions, contact your health care provider. What can I expect after the procedure? After the procedure, it is common to have: Soreness or pain. Pain usually improves within a week. Slight redness or swelling at the incision site. Follow these instructions at home: Medicines Take over-the-counter and prescription medicines only as told by your health care provider. If you were prescribed an antibiotic medicine, take it as told by your health care provider. Do not stop taking the antibiotic  even if you start to feel better. Ask your health care provider if the medicine prescribed to you: Requires you to avoid driving or using heavy machinery. Can cause constipation. You may need to take actions to prevent or treat constipation, such as: Drink enough fluid to keep your urine pale yellow. Take over-the-counter or prescription medicines. Eat foods that are high in fiber, such as beans, whole grains, and fresh fruits  and vegetables. Limit foods that are high in fat and processed sugars, such as fried or sweet foods. Managing pain, stiffness, and swelling  If directed, put ice on the affected area. To do this: Put ice in a plastic bag. Place a towel between your skin and the bag. Leave the ice on for 20 minutes, 2-3 times a day. Remove the ice if your skin turns bright red. This is very important. If you cannot feel pain, heat, or cold, you have a greater risk of damage to the area. Move your other toes often to reduce stiffness and swelling. Raise (elevate) the injured area above the level of your heart while you are sitting or lying down. Incision care   Follow instructions from your health care provider about how to take care of your incision. Make sure you: Wash your hands with soap and water for at least 20 seconds before and after you change your bandage (dressing). If soap and water are not available, use hand sanitizer. Change your dressing as told by your health care provider. Leave stitches (sutures), skin glue, or adhesive strips in place. These skin closures may need to stay in place for 2 weeks or longer. If adhesive strip edges start to loosen and curl up, you may trim the loose edges. Do not remove adhesive strips completely unless your health care provider tells you to do that. Check your incision area every day for signs of infection. Check for: More redness, swelling, or pain. Fluid or blood. Warmth. Pus or a bad smell. Bathing Do not take baths, swim, or use a  hot tub until your health care provider approves. Ask your health care provider if you may take showers. You may only be allowed to take sponge baths. Do not soak your foot until your health care provider approves. If your dressing has been removed and is no longer needed, you may wash your skin with warm water and soap. Do not scrub in the area where you had your surgery (surgical site). Activity Rest as told by your health care provider. Avoid sitting for a long time without moving. Get up to take short walks every 1-2 hours. This is important to improve blood flow and breathing. Ask for help if you feel weak or unsteady. Do not use the injured limb to support your body weight until your health care provider says that you can. Use crutches as told by your health care provider. Do exercises as told by your health care provider. Return to your normal activities as told by your health care provider. Ask your health care provider what activities are safe for you. General instructions If you were given a sedative during the procedure, it can affect you for several hours. Do not drive or operate machinery until your health care provider says that it is safe. Ask your health care provider when it is safe to drive if you have a dressing on your foot. Do not use any products that contain nicotine or tobacco. These products include cigarettes, chewing tobacco, and vaping devices, such as e-cigarettes. If you need help quitting, ask your health care provider. Ask your health care provider about wearing supportive shoes or using inserts to help with your balance when walking. If you have diabetes, keep your blood sugar under good control and check your feet daily for sores or open areas. Keep all follow-up visits. This is important. Contact a health care provider if: You have signs of infection at your incision, such as: Redness,  swelling, or pain. Fluid or blood. Warmth. Pus or a bad smell. You have a  fever or chills. Your dressing is soaked with blood. Your sutures tear or they separate. Your pain does not improve after you take your medicine. Get help right away if: You have red streaks on your skin near your toes, foot, or leg. You have pain in your calf or behind your knee. You feel extreme pain, numbness, weakness, loss of a pulse, or your foot feels cold to touch. This can be a sign of a medical emergency called compartment syndrome. You have chest pain or shortness of breath. These symptoms may be an emergency. Get help right away. Call 911. Do not wait to see if the symptoms will go away. Do not drive yourself to the hospital. Summary After the procedure, it is common to have some pain. Pain usually improves within a week. If you were prescribed an antibiotic medicine, take it as told by your health care provider. Do not stop taking the antibiotic even if you start to feel better. Change your dressing as told by your health care provider. Get help right away if you have red streaks on your skin or develop signs of compartment syndrome. Keep all follow-up visits. This is important. This information is not intended to replace advice given to you by your health care provider. Make sure you discuss any questions you have with your health care provider. Document Revised: 08/20/2020 Document Reviewed: 08/20/2020 Elsevier Patient Education  2022 [employer] Anesthesia, Adult, Care After This sheet gives you information about how to care for yourself after your procedure. Your health care provider may also give you more specific instructions. If you have problems or questions, contact your health care provider. What can I expect after the procedure? After the procedure, the following side effects are common: Pain or discomfort at the IV site. Nausea. Vomiting. Sore throat. Trouble concentrating. Feeling cold or chills. Feeling weak or tired. Sleepiness and  fatigue. Soreness and body aches. These side effects can affect parts of the body that were not involved in surgery. Follow these instructions at home: For the time period you were told by your health care provider:  Rest. Do not participate in activities where you could fall or become injured. Do not drive or use machinery. Do not drink alcohol. Do not take sleeping pills or medicines that cause drowsiness. Do not make important decisions or sign legal documents. Do not take care of children on your own. Eating and drinking Follow any instructions from your health care provider about eating or drinking restrictions. When you feel hungry, start by eating small amounts of foods that are soft and easy to digest (bland), such as toast. Gradually return to your regular diet. Drink enough fluid to keep your urine pale yellow. If you vomit, rehydrate by drinking water, juice, or clear broth. General instructions If you have sleep apnea, surgery and certain medicines can increase your risk for breathing problems. Follow instructions from your health care provider about wearing your sleep device: Anytime you are sleeping, including during daytime naps. While taking prescription pain medicines, sleeping medicines, or medicines that make you drowsy. Have a responsible adult stay with you for the time you are told. It is important to have someone help care for you until you are awake and alert. Return to your normal activities as told by your health care provider. Ask your health care provider what activities are safe for you. Take over-the-counter and prescription  medicines only as told by your health care provider. If you smoke, do not smoke without supervision. Keep all follow-up visits as told by your health care provider. This is important. Contact a health care provider if: You have nausea or vomiting that does not get better with medicine. You cannot eat or drink without vomiting. You have  pain that does not get better with medicine. You are unable to pass urine. You develop a skin rash. You have a fever. You have redness around your IV site that gets worse. Get help right away if: You have difficulty breathing. You have chest pain. You have blood in your urine or stool, or you vomit blood. Summary After the procedure, it is common to have a sore throat or nausea. It is also common to feel tired. Have a responsible adult stay with you for the time you are told. It is important to have someone help care for you until you are awake and alert. When you feel hungry, start by eating small amounts of foods that are soft and easy to digest (bland), such as toast. Gradually return to your regular diet. Drink enough fluid to keep your urine pale yellow. Return to your normal activities as told by your health care provider. Ask your health care provider what activities are safe for you. This information is not intended to replace advice given to you by your health care provider. Make sure you discuss any questions you have with your health care provider. Document Revised: 09/22/2019 Document Reviewed: 04/21/2019 Elsevier Patient Education  2022 [employer].

## 2021-02-18 ENCOUNTER — Encounter (HOSPITAL_COMMUNITY)
Admission: RE | Admit: 2021-02-18 | Discharge: 2021-02-18 | Disposition: A | Discharge status: Home / Self Care | Payer: Medicare Other | Source: Ambulatory Visit | Attending: General Surgery | Admitting: General Surgery

## 2021-02-18 ENCOUNTER — Other Ambulatory Visit: Payer: Self-pay

## 2021-02-18 ENCOUNTER — Encounter (HOSPITAL_COMMUNITY): Payer: Self-pay

## 2021-02-20 ENCOUNTER — Other Ambulatory Visit: Payer: Self-pay

## 2021-02-20 ENCOUNTER — Ambulatory Visit (HOSPITAL_COMMUNITY)
Admission: RE | Admit: 2021-02-20 | Discharge: 2021-02-20 | Disposition: A | Discharge status: Home / Self Care | Payer: Medicare Other | Attending: General Surgery | Admitting: General Surgery

## 2021-02-20 ENCOUNTER — Ambulatory Visit (HOSPITAL_COMMUNITY): Payer: Medicare Other | Admitting: Anesthesiology

## 2021-02-20 ENCOUNTER — Encounter (HOSPITAL_COMMUNITY): Payer: Self-pay | Admitting: General Surgery

## 2021-02-20 ENCOUNTER — Encounter (HOSPITAL_COMMUNITY)
Admission: RE | Disposition: A | Discharge status: Home / Self Care | Payer: Self-pay | Source: Home / Self Care | Attending: General Surgery

## 2021-02-20 DIAGNOSIS — Z87891 Personal history of nicotine dependence: Secondary | ICD-10-CM | POA: Insufficient documentation

## 2021-02-20 DIAGNOSIS — I10 Essential (primary) hypertension: Secondary | ICD-10-CM | POA: Diagnosis not present

## 2021-02-20 DIAGNOSIS — R52 Pain, unspecified: Secondary | ICD-10-CM | POA: Insufficient documentation

## 2021-02-20 DIAGNOSIS — K219 Gastro-esophageal reflux disease without esophagitis: Secondary | ICD-10-CM | POA: Insufficient documentation

## 2021-02-20 DIAGNOSIS — Z79899 Other long term (current) drug therapy: Secondary | ICD-10-CM | POA: Insufficient documentation

## 2021-02-20 DIAGNOSIS — M1990 Unspecified osteoarthritis, unspecified site: Secondary | ICD-10-CM | POA: Diagnosis not present

## 2021-02-20 DIAGNOSIS — Z7984 Long term (current) use of oral hypoglycemic drugs: Secondary | ICD-10-CM | POA: Insufficient documentation

## 2021-02-20 DIAGNOSIS — I96 Gangrene, not elsewhere classified: Secondary | ICD-10-CM | POA: Diagnosis not present

## 2021-02-20 DIAGNOSIS — E1140 Type 2 diabetes mellitus with diabetic neuropathy, unspecified: Secondary | ICD-10-CM | POA: Diagnosis not present

## 2021-02-20 DIAGNOSIS — E1152 Type 2 diabetes mellitus with diabetic peripheral angiopathy with gangrene: Secondary | ICD-10-CM | POA: Insufficient documentation

## 2021-02-20 HISTORY — PX: AMPUTATION TOE: SHX6595

## 2021-02-20 LAB — GLUCOSE, CAPILLARY
Glucose-Capillary: 82 mg/dL (ref 70–99)
Glucose-Capillary: 109 mg/dL — ABNORMAL HIGH (ref 70–99)
Glucose-Capillary: 88 mg/dL (ref 70–99)

## 2021-02-20 SURGERY — AMPUTATION, TOE
Anesthesia: Monitor Anesthesia Care | Site: Toe | Laterality: Right

## 2021-02-20 MED ORDER — HYDROMORPHONE HCL 1 MG/ML IJ SOLN: 0.2500 mg | INTRAMUSCULAR | Status: DC | PRN

## 2021-02-20 MED ORDER — CHLORHEXIDINE GLUCONATE CLOTH 2 % EX PADS: 6.0000 | MEDICATED_PAD | Freq: Once | CUTANEOUS | Status: DC

## 2021-02-20 MED ORDER — LIDOCAINE HCL (PF) 1 % IJ SOLN
INTRAMUSCULAR | Status: AC
  Filled 2021-02-20: qty 30

## 2021-02-20 MED ORDER — KETOROLAC TROMETHAMINE 30 MG/ML IJ SOLN: 30.0000 mg | Freq: Once | INTRAMUSCULAR | Status: DC

## 2021-02-20 MED ORDER — LIDOCAINE HCL (CARDIAC) PF 100 MG/5ML IV SOSY
PREFILLED_SYRINGE | INTRAVENOUS | Status: DC | PRN
Start: 2021-02-20 — End: 2021-02-20
  Administered 2021-02-20: 40 mg via INTRATRACHEAL

## 2021-02-20 MED ORDER — DEXTROSE 50 % IV SOLN
25.0000 mL | Freq: Once | INTRAVENOUS | Status: AC
  Administered 2021-02-20: 25 mL via INTRAVENOUS

## 2021-02-20 MED ORDER — CEFAZOLIN SODIUM-DEXTROSE 2-4 GM/100ML-% IV SOLN
2.0000 g | INTRAVENOUS | Status: AC
  Administered 2021-02-20: 2 g via INTRAVENOUS

## 2021-02-20 MED ORDER — PHENYLEPHRINE 40 MCG/ML (10ML) SYRINGE FOR IV PUSH (FOR BLOOD PRESSURE SUPPORT)
PREFILLED_SYRINGE | INTRAVENOUS | Status: DC | PRN
  Administered 2021-02-20: 80 ug via INTRAVENOUS

## 2021-02-20 MED ORDER — ORAL CARE MOUTH RINSE: 15.0000 mL | Freq: Once | OROMUCOSAL | Status: AC

## 2021-02-20 MED ORDER — MIDAZOLAM HCL 2 MG/2ML IJ SOLN
INTRAMUSCULAR | Status: AC
  Filled 2021-02-20: qty 2

## 2021-02-20 MED ORDER — ONDANSETRON HCL 4 MG/2ML IJ SOLN: 4.0000 mg | Freq: Once | INTRAMUSCULAR | Status: DC | PRN

## 2021-02-20 MED ORDER — CHLORHEXIDINE GLUCONATE 0.12 % MT SOLN
OROMUCOSAL | Status: AC
  Filled 2021-02-20: qty 15

## 2021-02-20 MED ORDER — DEXTROSE 50 % IV SOLN
INTRAVENOUS | Status: AC
  Filled 2021-02-20: qty 50

## 2021-02-20 MED ORDER — PROPOFOL 10 MG/ML IV BOLUS
INTRAVENOUS | Status: DC | PRN
  Administered 2021-02-20: 40 mg via INTRAVENOUS

## 2021-02-20 MED ORDER — FENTANYL CITRATE (PF) 100 MCG/2ML IJ SOLN
INTRAMUSCULAR | Status: AC
  Filled 2021-02-20: qty 2

## 2021-02-20 MED ORDER — PHENYLEPHRINE 40 MCG/ML (10ML) SYRINGE FOR IV PUSH (FOR BLOOD PRESSURE SUPPORT)
PREFILLED_SYRINGE | INTRAVENOUS | Status: AC
  Filled 2021-02-20: qty 10

## 2021-02-20 MED ORDER — LIDOCAINE HCL (PF) 1 % IJ SOLN
INTRAMUSCULAR | Status: DC | PRN
  Administered 2021-02-20 (×2): 3 mL

## 2021-02-20 MED ORDER — KETAMINE HCL 10 MG/ML IJ SOLN
INTRAMUSCULAR | Status: DC | PRN
Start: 2021-02-20 — End: 2021-02-20
  Administered 2021-02-20: 20 mg via INTRAVENOUS

## 2021-02-20 MED ORDER — CHLORHEXIDINE GLUCONATE 0.12 % MT SOLN
15.0000 mL | Freq: Once | OROMUCOSAL | Status: AC
  Administered 2021-02-20: 15 mL via OROMUCOSAL

## 2021-02-20 MED ORDER — SULFAMETHOXAZOLE-TRIMETHOPRIM 800-160 MG PO TABS
1.0000 | ORAL_TABLET | Freq: Two times a day (BID) | ORAL | 0 refills | Status: DC
Start: 2021-02-20 — End: 2021-03-05

## 2021-02-20 MED ORDER — LIDOCAINE HCL (PF) 2 % IJ SOLN
INTRAMUSCULAR | Status: AC
  Filled 2021-02-20: qty 5

## 2021-02-20 MED ORDER — SODIUM CHLORIDE 0.9 % IR SOLN
Status: DC | PRN
  Administered 2021-02-20: 1000 mL

## 2021-02-20 MED ORDER — LACTATED RINGERS IV SOLN: INTRAVENOUS | Status: DC

## 2021-02-20 MED ORDER — CEFAZOLIN SODIUM-DEXTROSE 2-4 GM/100ML-% IV SOLN
INTRAVENOUS | Status: AC
  Filled 2021-02-20: qty 100

## 2021-02-20 MED ORDER — FENTANYL CITRATE (PF) 100 MCG/2ML IJ SOLN
100.0000 ug | Freq: Once | INTRAMUSCULAR | Status: AC
  Administered 2021-02-20: 100 ug via INTRAVENOUS

## 2021-02-20 MED ORDER — ROPIVACAINE HCL 5 MG/ML IJ SOLN
INTRAMUSCULAR | Status: DC | PRN
  Administered 2021-02-20: 20 mL via PERINEURAL
  Administered 2021-02-20: 10 mL via PERINEURAL

## 2021-02-20 MED ORDER — MEPERIDINE HCL 50 MG/ML IJ SOLN: 6.2500 mg | INTRAMUSCULAR | Status: DC | PRN

## 2021-02-20 MED ORDER — ROPIVACAINE HCL 5 MG/ML IJ SOLN
INTRAMUSCULAR | Status: AC
  Filled 2021-02-20: qty 30

## 2021-02-20 MED ORDER — MIDAZOLAM HCL 2 MG/2ML IJ SOLN
2.0000 mg | Freq: Once | INTRAMUSCULAR | Status: AC
  Administered 2021-02-20: 2 mg via INTRAVENOUS

## 2021-02-20 MED ORDER — KETAMINE HCL 50 MG/5ML IJ SOSY
PREFILLED_SYRINGE | INTRAMUSCULAR | Status: AC
  Filled 2021-02-20: qty 5

## 2021-02-20 MED ORDER — PROPOFOL 500 MG/50ML IV EMUL
INTRAVENOUS | Status: DC | PRN
  Administered 2021-02-20: 50 ug/kg/min via INTRAVENOUS

## 2021-02-20 SURGICAL SUPPLY — 35 items
BANDAGE ELASTIC 4 VELCRO NS (GAUZE/BANDAGES/DRESSINGS) ×1 IMPLANT
BNDG CONFORM 2 STRL LF (GAUZE/BANDAGES/DRESSINGS) ×1 IMPLANT
BNDG CONFORM 3 STRL LF (GAUZE/BANDAGES/DRESSINGS) IMPLANT
BNDG ELASTIC 4X5.8 VLCR NS LF (GAUZE/BANDAGES/DRESSINGS) ×1 IMPLANT
BNDG GAUZE ELAST 4 BULKY (GAUZE/BANDAGES/DRESSINGS) ×1 IMPLANT
CLOTH BEACON ORANGE TIMEOUT ST (SAFETY) ×2 IMPLANT
COVER LIGHT HANDLE STERIS (MISCELLANEOUS) ×4 IMPLANT
ELECT REM PT RETURN 9FT ADLT (ELECTROSURGICAL) ×2
ELECTRODE REM PT RTRN 9FT ADLT (ELECTROSURGICAL) ×1 IMPLANT
GAUZE SPONGE 4X4 12PLY STRL (GAUZE/BANDAGES/DRESSINGS) ×2 IMPLANT
GAUZE XEROFORM 1X8 LF (GAUZE/BANDAGES/DRESSINGS) ×2 IMPLANT
GLOVE SURG ENC MOIS LTX SZ6.5 (GLOVE) ×1 IMPLANT
GLOVE SURG POLYISO LF SZ7.5 (GLOVE) ×3 IMPLANT
GLOVE SURG UNDER POLY LF SZ7 (GLOVE) ×6 IMPLANT
GOWN STRL REUS W/TWL LRG LVL3 (GOWN DISPOSABLE) ×6 IMPLANT
INST SET MINOR BONE (KITS) ×2 IMPLANT
KIT TURNOVER KIT A (KITS) ×2 IMPLANT
MANIFOLD NEPTUNE II (INSTRUMENTS) ×2 IMPLANT
NDL HYPO 18GX1.5 BLUNT FILL (NEEDLE) ×1 IMPLANT
NDL HYPO 21X1.5 SAFETY (NEEDLE) ×1 IMPLANT
NEEDLE HYPO 18GX1.5 BLUNT FILL (NEEDLE) ×2 IMPLANT
NEEDLE HYPO 21X1.5 SAFETY (NEEDLE) ×2 IMPLANT
NS IRRIG 1000ML POUR BTL (IV SOLUTION) ×2 IMPLANT
PACK BASIC LIMB (CUSTOM PROCEDURE TRAY) ×2 IMPLANT
PAD ARMBOARD 7.5X6 YLW CONV (MISCELLANEOUS) ×2 IMPLANT
SET BASIN LINEN APH (SET/KITS/TRAYS/PACK) ×2 IMPLANT
SOL PREP PROV IODINE SCRUB 4OZ (MISCELLANEOUS) ×2 IMPLANT
SPONGE GAUZE 2X2 8PLY STRL LF (GAUZE/BANDAGES/DRESSINGS) ×2 IMPLANT
SPONGE T-LAP 18X18 ~~LOC~~+RFID (SPONGE) ×2 IMPLANT
SUT PROLENE 2 0 FS (SUTURE) ×4 IMPLANT
SWAB CULTURE ESWAB REG 1ML (MISCELLANEOUS) ×1 IMPLANT
SWAB CULTURE LIQ STUART DBL (MISCELLANEOUS) ×1 IMPLANT
SYR 30ML LL (SYRINGE) ×2 IMPLANT
SYR BULB IRRIG 60ML STRL (SYRINGE) ×2 IMPLANT
SYR CONTROL 10ML LL (SYRINGE) ×2 IMPLANT

## 2021-02-20 NOTE — Op Note (Signed)
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  The risks and benefits of the procedure including bleeding, infection, and wound breakdown were fully explained to the patient, who gave informed consent.  Procedure note: The patient was placed in the supine position.  After anesthesia was administered, the right foot was prepped and draped using the usual sterile technique with Betadine.  Surgical site confirmation was performed.  An elliptical incision was made around the base of the right great toe where there was healthy tissue.  Any necrotic tissue was incised down to just proximal to the metatarsal head.  A transmetatarsal amputation was performed and the toe was removed.  Any necrotic tissue was debrided using a curette.  Anaerobic and aerobic cultures were taken and sent to microbiology.  The remaining bone was grasped without difficulty.  The wound was then copiously irrigated with normal saline.  The anterior and posterior flaps were then reapproximated using 2-0 Prolene vertical mattress sutures.  Betadine ointment, Xeroform, and a dry sterile dressing were applied.  All tape and needle counts were correct at the end of the procedure.  The patient was awakened and transferred to PACU in stable condition.  Complications: None  EBL: Minimal  Specimen: Right great toe

## 2021-02-20 NOTE — Anesthesia Procedure Notes (Signed)
Procedure Name: MAC Date/Time: 02/20/2021 11:27 AM Performed by: Orlie Dakin, CRNA Pre-anesthesia Checklist: Patient identified, Emergency Drugs available, Suction available and Patient being monitored Oxygen Delivery Method: Non-rebreather mask Induction Type: IV induction Placement Confirmation: positive ETCO2

## 2021-02-20 NOTE — Transfer of Care (Signed)
Immediate Anesthesia Transfer of Care Note  Patient: [name]  Procedure(s) Performed: AMPUTATION  TRANSMATARSAL  RIGHT; GREAT TOE (Right: Toe)  Patient Location: PACU  Anesthesia Type:MAC and Regional  Level of Consciousness: awake, alert  and oriented  Airway & Oxygen Therapy: Patient Spontanous Breathing  Post-op Assessment: Report given to RN and Post -op Vital signs reviewed and stable  Post vital signs: Reviewed and stable  Last Vitals:  Vitals Value Taken Time  BP 114/68 02/20/21 1153  Temp    Pulse 74 02/20/21 1154  Resp 13 02/20/21 1154  SpO2 95 % 02/20/21 1154  Vitals shown include unvalidated device data.  Last Pain:  Vitals:   02/20/21 0909  TempSrc: Oral  PainSc: 9          Complications: No notable events documented.

## 2021-02-20 NOTE — Anesthesia Procedure Notes (Signed)
Anesthesia Regional Block: Popliteal block   Pre-Anesthetic Checklist: , timeout performed,  Correct Patient, Correct Site, Correct Laterality,  Correct Procedure, Correct Position, site marked,  Risks and benefits discussed,  Surgical consent,  Pre-op evaluation,  At surgeon's request and post-op pain management  Laterality: Right and Lower  Prep: chloraprep       Needles:  Injection technique: Single-shot  Needle Type: Echogenic Needle     Needle Length: 10cm  Needle Gauge: 20   Needle insertion depth: 6 cm   Additional Needles:   Procedures:,,,, ultrasound used (permanent image in chart),,   Motor weakness within 15 minutes.  Narrative:  Start time: 02/20/2021 10:08 AM End time: 02/20/2021 10:13 AM Injection made incrementally with aspirations every 5 mL.  Performed by: Personally  Anesthesiologist: Denese Killings, MD  Additional Notes: BP cuff, EKG monitors applied. Sedation begun. After nerve location anesthetic injected incrementally, slowly , and after neg aspirations. Tolerated well.

## 2021-02-20 NOTE — Interval H&P Note (Signed)
History and Physical Interval Note:  02/20/2021 9:23 AM  [name]  has presented today for surgery, with the diagnosis of Right great toe cellulitis.  The various methods of treatment have been discussed with the patient and family. After consideration of risks, benefits and other options for treatment, the patient has consented to  Procedure(s) with comments: AMPUTATION TOE; RIGHT; GREAT (Right) - Pt notified to arrive at 7:00am per KF as a surgical intervention.  The patient's history has been reviewed, patient examined, no change in status, stable for surgery.  I have reviewed the patient's chart and labs.  Questions were answered to the patient's satisfaction.     Aviva Signs

## 2021-02-20 NOTE — Anesthesia Procedure Notes (Signed)
Anesthesia Regional Block: Adductor canal block   Pre-Anesthetic Checklist: , timeout performed,  Correct Patient, Correct Site, Correct Laterality,  Correct Procedure, Correct Position, site marked,  Risks and benefits discussed,  Surgical consent,  Pre-op evaluation,  At surgeon's request and post-op pain management  Laterality: Right and Lower  Prep: chloraprep       Needles:  Injection technique: Single-shot  Needle Type: Echogenic Needle     Needle Length: 10cm  Needle Gauge: 20   Needle insertion depth: 5 cm   Additional Needles:   Procedures:,,,, ultrasound used (permanent image in chart),,    Narrative:  Start time: 02/20/2021 10:14 AM End time: 02/20/2021 10:18 AM Injection made incrementally with aspirations every 5 mL.  Performed by: Personally  Anesthesiologist: Denese Killings, MD  Additional Notes: BP cuff, EKG monitors applied. Sedation begun. After nerve location anesthetic injected incrementally, slowly , and after neg aspirations. Tolerated well.

## 2021-02-20 NOTE — Anesthesia Preprocedure Evaluation (Addendum)
Anesthesia Evaluation  Patient identified by MRN, date of birth, ID band Patient awake    Reviewed: Allergy & Precautions, NPO status , Patient's Chart, lab work & pertinent test results  Airway Mallampati: II  TM Distance: >3 FB Neck ROM: Full    Dental  (+) Dental Advisory Given   Pulmonary former smoker,    Pulmonary exam normal breath sounds clear to auscultation       Cardiovascular hypertension, Pt. on medications Normal cardiovascular exam Rhythm:Regular Rate:Normal     Neuro/Psych  Headaches,    GI/Hepatic PUD, GERD  Medicated and Controlled,  Endo/Other  diabetes, Well Controlled, Type 2, Oral Hypoglycemic Agents  Renal/GU Renal disease     Musculoskeletal  (+) Arthritis , Osteoarthritis,    Abdominal   Peds  Hematology   Anesthesia Other Findings   Reproductive/Obstetrics                             Anesthesia Physical Anesthesia Plan  ASA: 3  Anesthesia Plan: Regional and MAC   Post-op Pain Management: Minimal or no pain anticipated, Regional block and Dilaudid IV   Induction: Intravenous  PONV Risk Score and Plan:   Airway Management Planned: Nasal Cannula, Natural Airway and Simple Face Mask  Additional Equipment:   Intra-op Plan:   Post-operative Plan:   Informed Consent: I have reviewed the patients History and Physical, chart, labs and discussed the procedure including the risks, benefits and alternatives for the proposed anesthesia with the patient or authorized representative who has indicated his/her understanding and acceptance.     Dental advisory given  Plan Discussed with: CRNA and Surgeon  Anesthesia Plan Comments: (Risk of nerve damage was explained, Possible GA with airway was discussed.)       Anesthesia Quick Evaluation

## 2021-02-20 NOTE — Anesthesia Postprocedure Evaluation (Signed)
Anesthesia Post Note  Patient: [name]  Procedure(s) Performed: AMPUTATION  TRANSMATARSAL  RIGHT; GREAT TOE (Right: Toe)  Patient location during evaluation: PACU Anesthesia Type: Regional Level of consciousness: awake and alert Pain management: pain level controlled Vital Signs Assessment: post-procedure vital signs reviewed and stable Respiratory status: spontaneous breathing, nonlabored ventilation and respiratory function stable Cardiovascular status: blood pressure returned to baseline and stable Postop Assessment: no apparent nausea or vomiting Anesthetic complications: no   No notable events documented.   Last Vitals:  Vitals:   02/20/21 1154 02/20/21 1200  BP: 114/68 113/70  Pulse: 74 71  Resp: 13 13  Temp:    SpO2: 95% 93%    Last Pain:  Vitals:   02/20/21 1153  TempSrc:   PainSc: 0-No pain                 Yousif Edelson C Minerva Bluett

## 2021-02-21 ENCOUNTER — Encounter (HOSPITAL_COMMUNITY): Payer: Self-pay | Admitting: General Surgery

## 2021-02-22 LAB — SURGICAL PATHOLOGY

## 2021-02-25 LAB — AEROBIC/ANAEROBIC CULTURE W GRAM STAIN (SURGICAL/DEEP WOUND)

## 2021-03-05 ENCOUNTER — Ambulatory Visit (INDEPENDENT_AMBULATORY_CARE_PROVIDER_SITE_OTHER): Payer: Medicare Other | Admitting: General Surgery

## 2021-03-05 ENCOUNTER — Encounter: Payer: Self-pay | Admitting: General Surgery

## 2021-03-05 ENCOUNTER — Other Ambulatory Visit: Payer: Self-pay

## 2021-03-05 VITALS — BP 142/88 | HR 102 | Temp 97.9°F | Resp 16 | Ht 69.0 in | Wt 193.0 lb

## 2021-03-05 DIAGNOSIS — Z09 Encounter for follow-up examination after completed treatment for conditions other than malignant neoplasm: Secondary | ICD-10-CM

## 2021-03-05 NOTE — Progress Notes (Signed)
Subjective:     [name]  Here for postoperative visit, status post right great toe amputation on 02/20/2021.  He states he has been keeping the wound clean and dry with soap and water.  Minimal drainage of serosanguineous fluid noted.  No purulent drainage noted. Objective:    BP (!) 142/88    Pulse (!) 102    Temp 97.9 F (36.6 C) (Other (Comment))    Resp 16    Ht 5\' 9"  (1.753 m)    Wt 193 lb (87.5 kg)    SpO2 93%    BMI 28.50 kg/m   General:  alert, cooperative, and no distress  Right great toe healing well with 1 small area of serosanguineous drainage present along the lateral aspect of the incision.  Most but not all sutures were removed. Final pathology reviewed.     Assessment:    Doing well postoperatively.    Plan:   Continue keeping wound clean and dry daily with soap and water.  Follow-up here in 1 week for wound check.

## 2021-03-07 ENCOUNTER — Telehealth: Payer: Self-pay | Admitting: *Deleted

## 2021-03-07 NOTE — Telephone Encounter (Signed)
Received call from patient.   Reports that amputation site had suture removed on 03/05/2021. Reports that one suture was left to avoid dehiscence.   States that area has opened slightly and is draining clear to blood tinged fluid. Reports slight tenderness to surrounding tissue. No fever noted.   Discussed with Dr. Arnoldo Morale. Reports this is routine healing. Advised to keep appointment on 03/12/2021 for follow up.   Call placed to patient. [place].

## 2021-03-08 NOTE — Telephone Encounter (Signed)
Multiple calls placed to patient with no answer and no return call.   Message to be closed.  

## 2021-03-12 ENCOUNTER — Ambulatory Visit (INDEPENDENT_AMBULATORY_CARE_PROVIDER_SITE_OTHER): Payer: Medicare Other | Admitting: General Surgery

## 2021-03-12 ENCOUNTER — Other Ambulatory Visit: Payer: Self-pay

## 2021-03-12 ENCOUNTER — Encounter: Payer: Self-pay | Admitting: General Surgery

## 2021-03-12 VITALS — BP 111/65 | HR 100 | Temp 98.1°F | Resp 16 | Ht 69.0 in | Wt 194.0 lb

## 2021-03-12 DIAGNOSIS — Z09 Encounter for follow-up examination after completed treatment for conditions other than malignant neoplasm: Secondary | ICD-10-CM

## 2021-03-12 MED ORDER — SULFAMETHOXAZOLE-TRIMETHOPRIM 800-160 MG PO TABS: 1.0000 | ORAL_TABLET | Freq: Two times a day (BID) | ORAL | 0 refills | Status: DC

## 2021-03-12 NOTE — Progress Notes (Signed)
Subjective:     [name]  Patient is here for follow-up wound check.  Has had some clear yellow drainage from the wound with some pain and swelling at the incision site.  He is trying to elevate his right foot when able. Objective:    BP 111/65    Pulse 100    Temp 98.1 F (36.7 C) (Other (Comment))    Resp 16    Ht 5\' 9"  (1.753 m)    Wt 194 lb (88 kg)    SpO2 95%    BMI 28.65 kg/m   General:  alert, cooperative, and no distress  Right foot with edematous swelling with some erythema around the right great toe amputation site.  The remaining suture was removed.  It is slightly open.  Just serous drainage is noted.  No purulent drainage is noted.     Assessment:    Status post right great toe amputation.  Slight wound separation noted.  Edematous changes may be secondary to dependent edema.    Plan:   Keep wound clean and dry with soap and water.  Bactrim DS 1 tablet p.o. twice daily for 10 days has been prescribed.  Follow-up here in 2 weeks.

## 2021-03-13 ENCOUNTER — Telehealth: Payer: Self-pay | Admitting: *Deleted

## 2021-03-13 NOTE — Telephone Encounter (Signed)
Call placed to patient and patient made aware.  

## 2021-03-13 NOTE — Telephone Encounter (Signed)
Received call from patient.   Inquired if he is released to drive car at this time.   Please advise.

## 2021-03-18 ENCOUNTER — Emergency Department (HOSPITAL_COMMUNITY): Payer: Medicare Other

## 2021-03-18 ENCOUNTER — Other Ambulatory Visit: Payer: Self-pay

## 2021-03-18 ENCOUNTER — Encounter (HOSPITAL_COMMUNITY): Payer: Self-pay

## 2021-03-18 ENCOUNTER — Emergency Department (HOSPITAL_COMMUNITY)
Admission: EM | Admit: 2021-03-18 | Discharge: 2021-03-18 | Disposition: A | Discharge status: Home / Self Care | Payer: Medicare Other | Attending: Emergency Medicine | Admitting: Emergency Medicine

## 2021-03-18 DIAGNOSIS — Z79899 Other long term (current) drug therapy: Secondary | ICD-10-CM | POA: Insufficient documentation

## 2021-03-18 DIAGNOSIS — Z794 Long term (current) use of insulin: Secondary | ICD-10-CM | POA: Insufficient documentation

## 2021-03-18 DIAGNOSIS — Z91040 Latex allergy status: Secondary | ICD-10-CM | POA: Diagnosis not present

## 2021-03-18 DIAGNOSIS — I10 Essential (primary) hypertension: Secondary | ICD-10-CM | POA: Diagnosis not present

## 2021-03-18 DIAGNOSIS — E119 Type 2 diabetes mellitus without complications: Secondary | ICD-10-CM | POA: Insufficient documentation

## 2021-03-18 DIAGNOSIS — Z7984 Long term (current) use of oral hypoglycemic drugs: Secondary | ICD-10-CM | POA: Insufficient documentation

## 2021-03-18 DIAGNOSIS — G8918 Other acute postprocedural pain: Secondary | ICD-10-CM | POA: Insufficient documentation

## 2021-03-18 DIAGNOSIS — Z7982 Long term (current) use of aspirin: Secondary | ICD-10-CM | POA: Diagnosis not present

## 2021-03-18 MED ORDER — HYDROMORPHONE HCL 1 MG/ML IJ SOLN
1.0000 mg | Freq: Once | INTRAMUSCULAR | Status: AC
Start: 2021-03-18 — End: 2021-03-18
  Administered 2021-03-18: 1 mg via INTRAMUSCULAR
  Filled 2021-03-18: qty 1

## 2021-03-18 NOTE — Discharge Instructions (Addendum)
Follow-up with any other postoperative concerns with Dr. Arnoldo Morale.  He will be able to assess your wound healing and discuss appropriate pain control.

## 2021-03-18 NOTE — ED Triage Notes (Signed)
Pt presents to ED with complaints of pain from previous amputation of right great toe. Pt states it was bleeding last night.

## 2021-03-18 NOTE — ED Provider Notes (Signed)
[facility] EMERGENCY DEPARTMENT Provider Note   CSN: [id number] Arrival date & time: 03/18/21  1419     History  Chief Complaint  Patient presents with   Post-op Problem    [name] is a 61 y.o. male with a past medical history of hypertension, chronic pain Percocet, type 2 diabetes presenting with right foot pain.  On 2/1 patient had an amputation of his big toe due to uncontrolled diabetes.  He says that he was seen by his surgeon a week ago who told him it was okay for the site to be somewhat open because it was not bleeding and it needed to drain.  He says that he is having 10 out of 10 pain refractory to the Percocet that he takes for chronic pain.  He was placed on 10 days worth of Bactrim at his surgeon appointment and he continues to take this.    Home Medications Prior to Admission medications   Medication Sig Start Date End Date Taking? Authorizing Provider  ALPRAZolam Duanne Moron) 0.5 MG tablet Take 0.5 mg by mouth in the morning and at bedtime. 02/25/15   [provider]  amLODipine (NORVASC) 5 MG tablet Take 5 mg by mouth daily.    [provider]  aspirin EC 81 MG tablet Take 1 tablet (81 mg total) by mouth daily with breakfast. 06/22/19   Emokpae, Courage, MD  atorvastatin (LIPITOR) 20 MG tablet Take 20 mg by mouth daily. 03/31/19   [provider]  buPROPion (WELLBUTRIN XL) 300 MG 24 hr tablet Take 300 mg by mouth daily. 08/06/20   [provider]  diltiazem (TIAZAC) 420 MG 24 hr capsule Take 420 mg by mouth daily. 02/20/19   [provider]  DULoxetine (CYMBALTA) 30 MG capsule Take 30 mg by mouth at bedtime. 07/13/20   [provider]  furosemide (LASIX) 20 MG tablet Take 20 mg by mouth daily. 11/25/19   [provider]  gabapentin (NEURONTIN) 800 MG tablet Take 800 mg by mouth 3 (three) times daily. 08/29/19   [provider]  HYDROcodone-acetaminophen (NORCO) 10-325 MG tablet Take 1 tablet by mouth  every 6 (six) hours as needed for moderate pain or severe pain.    [provider]  insulin aspart (NOVOLOG) 100 UNIT/ML FlexPen Inject 0-10 Units into the skin 3 (three) times daily with meals. insulin aspart (novoLOG) injection 0-10 Units 0-10 Units Subcutaneous, 3 times daily with meals CBG < 70: Implement Hypoglycemia Standing Orders and refer to Hypoglycemia Standing Orders sidebar report  CBG 70 - 120: 0 unit CBG 121 - 150: 0 unit  CBG 151 - 200: 1 unit CBG 201 - 250: 2 units CBG 251 - 300: 4 units CBG 301 - 350: 6 units  CBG 351 - 400: 8 units  CBG > 400: 10 units 06/22/19   Emokpae, Courage, MD  Menthol-Methyl Salicylate (MUSCLE RUB) 10-15 % CREA Apply 1 application topically 4 (four) times daily as needed for muscle pain.    [provider]  metFORMIN (GLUCOPHAGE) 500 MG tablet Take 1,000 mg by mouth in the morning and at bedtime. In the morning & 1300    [provider]  Multiple Vitamin (MULTIVITAMIN WITH MINERALS) TABS tablet Take 1 tablet by mouth daily.    [provider]  naproxen (NAPROSYN) 500 MG tablet Take 500 mg by mouth 2 (two) times daily as needed. 06/19/20   [provider]  oxyCODONE-acetaminophen (PERCOCET) 7.5-325 MG tablet Take 1 tablet by mouth  every 6 (six) hours as needed. 01/15/21   [provider]  pantoprazole (PROTONIX) 40 MG tablet TAKE 1 TABLET(40 MG) BY MOUTH DAILY Patient taking differently: Take 40 mg by mouth daily. 04/30/20   Arnoldo Lenis, MD  sulfamethoxazole-trimethoprim (BACTRIM DS) 800-160 MG tablet Take 1 tablet by mouth 2 (two) times daily. 03/12/21   Aviva Signs, MD  tiZANidine (ZANAFLEX) 4 MG tablet Take 1 tablet (4 mg total) by mouth every 8 (eight) hours as needed for muscle spasms. 10/13/19   Viona Gilmore D, NP      Allergies    Latex    Review of Systems   Review of Systems See HPI  Physical Exam Updated Vital Signs BP 139/81 (BP Location: Right Arm)    Pulse (!) 109    Temp 98.2 F  (36.8 C) (Oral)    Resp 20    Ht 5\' 9"  (1.753 m)    Wt 84.4 kg    SpO2 97%    BMI 27.47 kg/m  Physical Exam Vitals and nursing note reviewed.  Constitutional:      Appearance: Normal appearance.  HENT:     Head: Normocephalic and atraumatic.  Eyes:     General: No scleral icterus.    Conjunctiva/sclera: Conjunctivae normal.  Pulmonary:     Effort: Pulmonary effort is normal. No respiratory distress.  Musculoskeletal:        General: Signs of injury present.     Comments: Photo in patient's chart.  Patient is status post amputation of first, third and fourth toes.  2 cm surgical site slightly with serosanguineous fluid coming out.  No surrounding cellulitis or signs of infection  Skin:    Findings: No rash.  Neurological:     Mental Status: He is alert.  Psychiatric:        Mood and Affect: Mood normal.    ED Results / Procedures / Treatments   Labs (all labs ordered are listed, but only abnormal results are displayed) Labs Reviewed - No data to display  EKG None  Radiology DG Foot Complete Right  Result Date: 03/18/2021 CLINICAL DATA:  Pain and bleeding at surgical site. Recent right great toe amputation. EXAM: RIGHT FOOT COMPLETE - 3+ VIEW COMPARISON:  Right foot radiographs 01/26/2021 FINDINGS: Sequelae of interval great toe amputation are identified at the level of the metatarsal head. Previous amputation is again noted at the third and fourth toes at the MTP joints. No acute fracture, dislocation, or osseous destruction is identified. Atherosclerotic vascular calcifications are noted. No dissecting subcutaneous emphysema is evident. IMPRESSION: Interval great toe amputation without evidence of acute osseous abnormality. Electronically Signed   By: Logan Bores M.D.   On: 03/18/2021 16:00    Procedures Procedures    Medications Ordered in ED Medications - No data to display  ED Course/ Medical Decision Making/ A&P                           Medical Decision  Making Amount and/or Complexity of Data Reviewed Radiology: ordered.  Risk Prescription drug management.   61 year old male presenting with pain from his toe amputation 3 weeks ago.  Reports the wound is not healing as well as he would like.  Continues to have small amounts of liquidy discharge.  Per chart review, patient was seen by surgeon 1 week ago, evaluated and given Bactrim.  He is still taking this however states his pain is severe.  He already  takes Percocet for chronic pain and says this is not helping.  X-ray obtained to rule out deeper infection.  Reviewed by me without signs of osteomyelitis or gas.  Radiologist with negative read.  Disposition: Patient to be discharged home with wound care.  He will follow-up with the surgeon if necessary to discuss his concerns.  Dilaudid given for pain control prior to discharge.  He is agreeable to this plan.        Final Clinical Impression(s) / ED Diagnoses Final diagnoses:  Post-operative pain    Rx / DC Orders  Results and diagnoses were explained to the patient. Return precautions discussed in full. Patient had no additional questions and expressed complete understanding.   This chart was dictated using voice recognition software.  Despite best efforts to proofread,  errors can occur which can change the documentation meaning.    Darliss Ridgel 03/18/21 1644    Wyvonnia Dusky, MD 03/18/21 [phone]

## 2021-03-27 ENCOUNTER — Encounter: Payer: Self-pay | Admitting: General Surgery

## 2021-03-27 ENCOUNTER — Ambulatory Visit (INDEPENDENT_AMBULATORY_CARE_PROVIDER_SITE_OTHER): Payer: Medicare Other | Admitting: General Surgery

## 2021-03-27 ENCOUNTER — Other Ambulatory Visit: Payer: Self-pay

## 2021-03-27 VITALS — BP 128/83 | HR 100 | Temp 98.7°F | Resp 16 | Ht 69.0 in | Wt 191.0 lb

## 2021-03-27 DIAGNOSIS — Z09 Encounter for follow-up examination after completed treatment for conditions other than malignant neoplasm: Secondary | ICD-10-CM

## 2021-03-27 NOTE — Progress Notes (Signed)
Subjective:  ?  ? [name]  ?Patient here for postoperative visit, status post right great toe amputation.  Patient continues to have clear yellow drainage.  He is trying to keep the wound clean and dry with soap and water. ?Objective:  ? ? BP 128/83   Pulse 100   Temp 98.7 ?F (37.1 ?C) (Other (Comment))   Resp 16   Ht '5\' 9"'$  (1.753 m)   Wt 191 lb (86.6 kg)   SpO2 94%   BMI 28.21 kg/m?  ? ?General:  alert, cooperative, and no distress  ?Right great toe amputation wound is slightly open with inversion of the skin edges.  Clear yellow fluid emanating from the base of the wound.  Decreased erythema noted around the wound.  The skin appears to be less swollen but is somewhat emaciated due to edema. ?   ? ?Assessment:  ? ? Poor wound healing, status post right great toe amputation  ?  ?Plan:  ? ?I told the patient that I would like to wait 2 weeks to let the wound completely decreased in size.  He ultimately will need to go back to the operating room for closure of the wound.  He understands this.  Follow-up on 04/09/2021.  Try to keep the wound clean and dry is much as possible. ?

## 2021-04-09 ENCOUNTER — Encounter: Payer: Self-pay | Admitting: General Surgery

## 2021-04-09 ENCOUNTER — Other Ambulatory Visit: Payer: Self-pay

## 2021-04-09 ENCOUNTER — Ambulatory Visit (INDEPENDENT_AMBULATORY_CARE_PROVIDER_SITE_OTHER): Payer: Medicare Other | Admitting: General Surgery

## 2021-04-09 VITALS — BP 149/89 | HR 104 | Temp 98.2°F | Resp 16 | Ht 69.0 in | Wt 191.0 lb

## 2021-04-09 DIAGNOSIS — Z09 Encounter for follow-up examination after completed treatment for conditions other than malignant neoplasm: Secondary | ICD-10-CM

## 2021-04-09 NOTE — Progress Notes (Signed)
Subjective:  ?  ? [name]  ?Patient is here for follow-up of his right foot wound, status post right great toe amputation.  He has had poor wound healing.  He continues to drain serous fluid.  The wound is smaller.  There is some pain associated with this. ?Objective:  ? ? BP (!) 149/89   Pulse (!) 104   Temp 98.2 ?F (36.8 ?C) (Other (Comment))   Resp 16   Ht '5\' 9"'$  (2.119 m)   Wt 191 lb (86.6 kg)   SpO2 94%   BMI 28.21 kg/m?  ? ?General:  alert, cooperative, and no distress  ?Right great toe with small wound dehiscence.  Erythema has resolved.  Some macerated looking tissue noted circumferentially around the wound. ?   ? ?Assessment:  ? ? Poor wound healing with dehiscence, status post right great toe amputation  ?  ?Plan:  ? ?Patient is scheduled for irrigation and revision of his right great toe amputation site on 04/19/2021.  The risks and benefits of the procedure including bleeding, infection, and poor wound healing were fully explained to the patient, who gave informed consent. ?

## 2021-04-10 NOTE — H&P (Signed)
[name] is an 61 y.o. male.   ?Chief Complaint: Poor wound healing, status post right great toe amputation, diabetes mellitus ?HPI: Patient is a 61 year old Hispanic male status post right great toe amputation on 02/20/2021 who has continued to have poor wound healing with dehiscence of the surgical wound.  He has been treated with antibiotics.  He now presents for debridement and closure of the right great toe amputation site. ? ?Past Medical History:  ?Diagnosis Date  ? Arthritis   ? Diabetes mellitus   ? GERD (gastroesophageal reflux disease)   ? Headache(784.0)   ? History of kidney stones   ? Hypertension   ? Kidney stones   ? Neuropathy   ? ? ?Past Surgical History:  ?Procedure Laterality Date  ? AMPUTATION TOE Right 06/21/2019  ? Procedure: AMPUTATION TOE;  Surgeon: Aviva Signs, MD;  Location: [facility];  Service: General;  Laterality: Right;  ? AMPUTATION TOE Right 08/29/2020  ? Procedure: RIGHT THIRD AMPUTATION TOE;  Surgeon: Aviva Signs, MD;  Location: [facility];  Service: General;  Laterality: Right;  third toe  ? AMPUTATION TOE Right 02/20/2021  ? Procedure: AMPUTATION  TRANSMATARSAL  RIGHT; GREAT TOE;  Surgeon: Aviva Signs, MD;  Location: [facility];  Service: General;  Laterality: Right;  ? BACK SURGERY  2022  ? CARDIAC CATHETERIZATION  01/21/2007  ? CHOLECYSTECTOMY N/A 10/24/2013  ? Procedure: LAPAROSCOPIC CHOLECYSTECTOMY;  Surgeon: Jamesetta So, MD;  Location: [facility];  Service: General;  Laterality: N/A;  ? COLONOSCOPY N/A 03/31/2013  ? Procedure: COLONOSCOPY;  Surgeon: Rogene Houston, MD;  Location: [facility];  Service: Endoscopy;  Laterality: N/A;  730  ? ? ?Family History  ?Problem Relation Age of Onset  ? Diabetes Mother   ? Heart failure Mother   ? Heart disease Mother   ? Heart failure Father   ? ?Social History:  reports that he quit smoking about 39 years ago. His smoking use included cigarettes. He has a 7.00 pack-year smoking history. He has never used smokeless tobacco. He  reports that he does not drink alcohol and does not use drugs. ? ?Allergies:  ?Allergies  ?Allergen Reactions  ? Latex Rash and Other (See Comments)  ?  Redness   ? ? ?No medications prior to admission.  ? ? ?No results found for this or any previous visit (from the past 48 hour(s)). ?No results found. ? ?Review of Systems  ?Constitutional:  Positive for fatigue.  ?HENT: Negative.    ?Eyes: Negative.   ?Respiratory: Negative.    ?Cardiovascular: Negative.   ?Gastrointestinal: Negative.   ?Endocrine: Negative.   ?Genitourinary: Negative.   ?Musculoskeletal: Negative.   ?Allergic/Immunologic: Negative.   ?Neurological: Negative.   ?Hematological: Negative.   ?Psychiatric/Behavioral: Negative.    ? ?There were no vitals taken for this visit. ?Physical Exam ?Vitals reviewed.  ?Constitutional:   ?   Appearance: Normal appearance. He is normal weight. He is not ill-appearing.  ?HENT:  ?   Head: Normocephalic and atraumatic.  ?Cardiovascular:  ?   Rate and Rhythm: Normal rate and regular rhythm.  ?   Heart sounds: Normal heart sounds. No murmur heard. ?  No friction rub. No gallop.  ?Pulmonary:  ?   Effort: Pulmonary effort is normal. No respiratory distress.  ?   Breath sounds: Normal breath sounds. No stridor. No wheezing, rhonchi or rales.  ?Musculoskeletal:  ?   Comments: Right great toe amputation site with macerated skin edges and clear yellow drainage from  the base of the wound.  No erythema or swelling is noted.  Multiple other toes have been amputated in the past.  Pulses intact.  ?Skin: ?   General: Skin is warm and dry.  ?Neurological:  ?   Mental Status: He is alert and oriented to person, place, and time.  ?  ? ?Assessment/Plan ?Impression: Poor wound healing with dehiscence, status post right great toe amputation, diabetes mellitus ?Plan: Patient is scheduled for debridement and closure of the right great toe amputation site on 04/19/2021.  The risks and benefits of the procedure including bleeding, infection,  and wound breakdown were fully explained to the patient, who gave informed consent. ? ?Aviva Signs, MD ?04/10/2021, 10:12 AM ? ? ? ?

## 2021-04-16 ENCOUNTER — Telehealth: Payer: Self-pay | Admitting: *Deleted

## 2021-04-16 NOTE — Telephone Encounter (Signed)
Received call from patient [phone]~ telephone.  ? ?Reports that he has increased edema to RLE S/P great toe amputation. Also reports increased pain to lower leg since prior surgery.  ? ?Advised that increased edema and pain are due to poor wound healing from prior surgery. Advised to continue pain management as directed. Advised to elevate foot/ LE when resting to decrease edema.  ? ?Patient noted distraught. Advised that the hope is that revision will allow wound to heal properly and will decrease pain/ edema.  ?

## 2021-04-16 NOTE — Patient Instructions (Signed)
? Your procedure is scheduled on: 04/19/2021 ? Report to [facility] En[facility] at    7:15 AM. ? Call this number if you have problems the morning of surgery: [phone] ? ? Remember: ? ? Do not Eat or Drink after midnight  ? ?      No Smoking the morning of surgery ? : ? Take these medicines the morning of surgery with A SIP OF WATER: Amlodipine, xanax, wellbutrin, Diltiazem, Cymbalta, gabapentin, pantoprazole and zanaflex ? ? Do not wear jewelry, make-up or nail polish. ? Do not wear lotions, powders, or perfumes. You may wear deodorant. ? Do not shave 48 hours prior to surgery. Men may shave face and neck. ? Do not bring valuables to the hospital. ? Contacts, dentures or bridgework may not be worn into surgery. ? Leave suitcase in the car. After surgery it may be brought to your room. ? For patients admitted to the hospital, checkout time is 11:00 AM the day of discharge. ? ? Patients discharged the day of surgery will not be allowed to drive home. ?  ? Special Instructions: Shower using CHG night before surgery and shower the day of surgery use CHG.  Use special wash - you have one bottle of CHG for all showers.  You should use approximately 1/2 of the bottle for each shower.  ?How to Use Chlorhexidine for Bathing ?Chlorhexidine gluconate (CHG) is a germ-killing (antiseptic) solution that is used to clean the skin. It can get rid of the bacteria that normally live on the skin and can keep them away for about 24 hours. To clean your skin with CHG, you may be given: ?A CHG solution to use in the shower or as part of a sponge bath. ?A prepackaged cloth that contains CHG. ?Cleaning your skin with CHG may help lower the risk for infection: ?While you are staying in the intensive care unit of the hospital. ?If you have a vascular access, such as a central line, to provide short-term or long-term access to your veins. ?If you have a catheter to drain urine from your bladder. ?If you are on a ventilator. A  ventilator is a machine that helps you breathe by moving air in and out of your lungs. ?After surgery. ?What are the risks? ?Risks of using CHG include: ?A skin reaction. ?Hearing loss, if CHG gets in your ears and you have a perforated eardrum. ?Eye injury, if CHG gets in your eyes and is not rinsed out. ?The CHG product catching fire. ?Make sure that you avoid smoking and flames after applying CHG to your skin. ?Do not use CHG: ?If you have a chlorhexidine allergy or have previously reacted to chlorhexidine. ?On babies younger than 26 months of age. ?How to use CHG solution ?Use CHG only as told by your health care provider, and follow the instructions on the label. ?Use the full amount of CHG as directed. Usually, this is one bottle. ?During a shower ?Follow these steps when using CHG solution during a shower (unless your health care provider gives you different instructions): ?Start the shower. ?Use your normal soap and shampoo to wash your face and hair. ?Turn off the shower or move out of the shower stream. ?Pour the CHG onto a clean washcloth. Do not use any type of brush or rough-edged sponge. ?Starting at your neck, lather your body down to your toes. Make sure you follow these instructions: ?If you will be having surgery, pay special attention to the part of your  body where you will be having surgery. Scrub this area for at least 1 minute. ?Do not use CHG on your head or face. If the solution gets into your ears or eyes, rinse them well with water. ?Avoid your genital area. ?Avoid any areas of skin that have broken skin, cuts, or scrapes. ?Scrub your back and under your arms. Make sure to wash skin folds. ?Let the lather sit on your skin for 1-2 minutes or as long as told by your health care provider. ?Thoroughly rinse your entire body in the shower. Make sure that all body creases and crevices are rinsed well. ?Dry off with a clean towel. Do not put any substances on your body afterward--such as powder,  lotion, or perfume--unless you are told to do so by your health care provider. Only use lotions that are recommended by the manufacturer. ?Put on clean clothes or pajamas. ?If it is the night before your surgery, sleep in clean sheets. ? ?During a sponge bath ?Follow these steps when using CHG solution during a sponge bath (unless your health care provider gives you different instructions): ?Use your normal soap and shampoo to wash your face and hair. ?Pour the CHG onto a clean washcloth. ?Starting at your neck, lather your body down to your toes. Make sure you follow these instructions: ?If you will be having surgery, pay special attention to the part of your body where you will be having surgery. Scrub this area for at least 1 minute. ?Do not use CHG on your head or face. If the solution gets into your ears or eyes, rinse them well with water. ?Avoid your genital area. ?Avoid any areas of skin that have broken skin, cuts, or scrapes. ?Scrub your back and under your arms. Make sure to wash skin folds. ?Let the lather sit on your skin for 1-2 minutes or as long as told by your health care provider. ?Using a different clean, wet washcloth, thoroughly rinse your entire body. Make sure that all body creases and crevices are rinsed well. ?Dry off with a clean towel. Do not put any substances on your body afterward--such as powder, lotion, or perfume--unless you are told to do so by your health care provider. Only use lotions that are recommended by the manufacturer. ?Put on clean clothes or pajamas. ?If it is the night before your surgery, sleep in clean sheets. ?How to use CHG prepackaged cloths ?Only use CHG cloths as told by your health care provider, and follow the instructions on the label. ?Use the CHG cloth on clean, dry skin. ?Do not use the CHG cloth on your head or face unless your health care provider tells you to. ?When washing with the CHG cloth: ?Avoid your genital area. ?Avoid any areas of skin that have  broken skin, cuts, or scrapes. ?Before surgery ?Follow these steps when using a CHG cloth to clean before surgery (unless your health care provider gives you different instructions): ?Using the CHG cloth, vigorously scrub the part of your body where you will be having surgery. Scrub using a back-and-forth motion for 3 minutes. The area on your body should be completely wet with CHG when you are done scrubbing. ?Do not rinse. Discard the cloth and let the area air-dry. Do not put any substances on the area afterward, such as powder, lotion, or perfume. ?Put on clean clothes or pajamas. ?If it is the night before your surgery, sleep in clean sheets. ? ?For general bathing ?Follow these steps when using CHG cloths  for general bathing (unless your health care provider gives you different instructions). ?Use a separate CHG cloth for each area of your body. Make sure you wash between any folds of skin and between your fingers and toes. Wash your body in the following order, switching to a new cloth after each step: ?The front of your neck, shoulders, and chest. ?Both of your arms, under your arms, and your hands. ?Your stomach and groin area, avoiding the genitals. ?Your right leg and foot. ?Your left leg and foot. ?The back of your neck, your back, and your buttocks. ?Do not rinse. Discard the cloth and let the area air-dry. Do not put any substances on your body afterward--such as powder, lotion, or perfume--unless you are told to do so by your health care provider. Only use lotions that are recommended by the manufacturer. ?Put on clean clothes or pajamas. ?Contact a health care provider if: ?Your skin gets irritated after scrubbing. ?You have questions about using your solution or cloth. ?You swallow any chlorhexidine. Call your local poison control center (1-(407)[phone] in the [place] ?Get help right away if: ?Your eyes itch badly, or they become very red or swollen. ?Your skin itches badly and is red or  swollen. ?Your hearing changes. ?You have trouble seeing. ?You have swelling or tingling in your mouth or throat. ?You have trouble breathing. ?These symptoms may represent a serious problem that is an emergency. Do not wai

## 2021-04-17 ENCOUNTER — Encounter (HOSPITAL_COMMUNITY): Payer: Self-pay

## 2021-04-17 ENCOUNTER — Encounter (HOSPITAL_COMMUNITY)
Admission: RE | Admit: 2021-04-17 | Discharge: 2021-04-17 | Disposition: A | Discharge status: Home / Self Care | Payer: Medicare Other | Source: Ambulatory Visit | Attending: General Surgery | Admitting: General Surgery

## 2021-04-17 VITALS — BP 126/73 | HR 102 | Temp 98.2°F | Resp 18 | Ht 69.0 in | Wt 192.0 lb

## 2021-04-17 DIAGNOSIS — E1165 Type 2 diabetes mellitus with hyperglycemia: Secondary | ICD-10-CM | POA: Insufficient documentation

## 2021-04-17 DIAGNOSIS — Z794 Long term (current) use of insulin: Secondary | ICD-10-CM | POA: Diagnosis not present

## 2021-04-17 DIAGNOSIS — I96 Gangrene, not elsewhere classified: Secondary | ICD-10-CM | POA: Insufficient documentation

## 2021-04-17 DIAGNOSIS — Z01812 Encounter for preprocedural laboratory examination: Secondary | ICD-10-CM | POA: Diagnosis not present

## 2021-04-17 LAB — CBC WITH DIFFERENTIAL/PLATELET
Abs Immature Granulocytes: 0.08 K/uL — ABNORMAL HIGH (ref 0.00–0.07)
Basophils Absolute: 0 K/uL (ref 0.0–0.1)
Basophils Relative: 0 %
Eosinophils Absolute: 0.1 K/uL (ref 0.0–0.5)
Eosinophils Relative: 1 %
HCT: 36.7 % — ABNORMAL LOW (ref 39.0–52.0)
Hemoglobin: 11.9 g/dL — ABNORMAL LOW (ref 13.0–17.0)
Immature Granulocytes: 1 %
Lymphocytes Relative: 28 %
Lymphs Abs: 2.7 K/uL (ref 0.7–4.0)
MCH: 28.6 pg (ref 26.0–34.0)
MCHC: 32.4 g/dL (ref 30.0–36.0)
MCV: 88.2 fL (ref 80.0–100.0)
Monocytes Absolute: 0.6 K/uL (ref 0.1–1.0)
Monocytes Relative: 6 %
Neutro Abs: 6.2 K/uL (ref 1.7–7.7)
Neutrophils Relative %: 64 %
Platelets: 298 K/uL (ref 150–400)
RBC: 4.16 MIL/uL — ABNORMAL LOW (ref 4.22–5.81)
RDW: 16.2 % — ABNORMAL HIGH (ref 11.5–15.5)
WBC: 9.7 K/uL (ref 4.0–10.5)
nRBC: 0 % (ref 0.0–0.2)

## 2021-04-17 LAB — BASIC METABOLIC PANEL WITH GFR
Anion gap: 13 (ref 5–15)
BUN: 16 mg/dL (ref 6–20)
CO2: 21 mmol/L — ABNORMAL LOW (ref 22–32)
Calcium: 8.8 mg/dL — ABNORMAL LOW (ref 8.9–10.3)
Chloride: 104 mmol/L (ref 98–111)
Creatinine, Ser: 0.9 mg/dL (ref 0.61–1.24)
GFR, Estimated: >60 mL/min
Glucose, Bld: 100 mg/dL — ABNORMAL HIGH (ref 70–99)
Potassium: 3.2 mmol/L — ABNORMAL LOW (ref 3.5–5.1)
Sodium: 138 mmol/L (ref 135–145)

## 2021-04-19 ENCOUNTER — Ambulatory Visit (HOSPITAL_COMMUNITY): Payer: Medicare Other | Admitting: Anesthesiology

## 2021-04-19 ENCOUNTER — Other Ambulatory Visit: Payer: Self-pay

## 2021-04-19 ENCOUNTER — Encounter (HOSPITAL_COMMUNITY): Payer: Self-pay | Admitting: General Surgery

## 2021-04-19 ENCOUNTER — Ambulatory Visit (HOSPITAL_COMMUNITY)
Admission: RE | Admit: 2021-04-19 | Discharge: 2021-04-19 | Disposition: A | Discharge status: Home / Self Care | Payer: Medicare Other | Attending: General Surgery | Admitting: General Surgery

## 2021-04-19 ENCOUNTER — Ambulatory Visit (HOSPITAL_BASED_OUTPATIENT_CLINIC_OR_DEPARTMENT_OTHER): Payer: Medicare Other | Admitting: Anesthesiology

## 2021-04-19 ENCOUNTER — Encounter (HOSPITAL_COMMUNITY)
Admission: RE | Disposition: A | Discharge status: Home / Self Care | Payer: Self-pay | Source: Home / Self Care | Attending: General Surgery

## 2021-04-19 DIAGNOSIS — M86171 Other acute osteomyelitis, right ankle and foot: Secondary | ICD-10-CM | POA: Diagnosis not present

## 2021-04-19 DIAGNOSIS — Z87891 Personal history of nicotine dependence: Secondary | ICD-10-CM | POA: Diagnosis not present

## 2021-04-19 DIAGNOSIS — L0889 Other specified local infections of the skin and subcutaneous tissue: Secondary | ICD-10-CM | POA: Insufficient documentation

## 2021-04-19 DIAGNOSIS — T8743 Infection of amputation stump, right lower extremity: Secondary | ICD-10-CM | POA: Diagnosis not present

## 2021-04-19 DIAGNOSIS — L03031 Cellulitis of right toe: Secondary | ICD-10-CM | POA: Insufficient documentation

## 2021-04-19 DIAGNOSIS — E119 Type 2 diabetes mellitus without complications: Secondary | ICD-10-CM | POA: Diagnosis not present

## 2021-04-19 DIAGNOSIS — K219 Gastro-esophageal reflux disease without esophagitis: Secondary | ICD-10-CM | POA: Insufficient documentation

## 2021-04-19 DIAGNOSIS — L97516 Non-pressure chronic ulcer of other part of right foot with bone involvement without evidence of necrosis: Secondary | ICD-10-CM | POA: Diagnosis not present

## 2021-04-19 DIAGNOSIS — I10 Essential (primary) hypertension: Secondary | ICD-10-CM | POA: Diagnosis not present

## 2021-04-19 DIAGNOSIS — K279 Peptic ulcer, site unspecified, unspecified as acute or chronic, without hemorrhage or perforation: Secondary | ICD-10-CM

## 2021-04-19 DIAGNOSIS — M86671 Other chronic osteomyelitis, right ankle and foot: Secondary | ICD-10-CM

## 2021-04-19 DIAGNOSIS — Z89411 Acquired absence of right great toe: Secondary | ICD-10-CM | POA: Insufficient documentation

## 2021-04-19 DIAGNOSIS — X58XXXA Exposure to other specified factors, initial encounter: Secondary | ICD-10-CM | POA: Diagnosis not present

## 2021-04-19 DIAGNOSIS — E1151 Type 2 diabetes mellitus with diabetic peripheral angiopathy without gangrene: Secondary | ICD-10-CM | POA: Insufficient documentation

## 2021-04-19 DIAGNOSIS — T8141XA Infection following a procedure, superficial incisional surgical site, initial encounter: Secondary | ICD-10-CM | POA: Insufficient documentation

## 2021-04-19 HISTORY — PX: IRRIGATION AND DEBRIDEMENT FOOT: SHX6602

## 2021-04-19 HISTORY — PX: SECONDARY CLOSURE OF WOUND: SHX6208

## 2021-04-19 LAB — GLUCOSE, CAPILLARY
Glucose-Capillary: 76 mg/dL (ref 70–99)
Glucose-Capillary: 112 mg/dL — ABNORMAL HIGH (ref 70–99)

## 2021-04-19 SURGERY — SECONDARY CLOSURE OF WOUND
Anesthesia: General | Site: Foot | Laterality: Right

## 2021-04-19 MED ORDER — CHLORHEXIDINE GLUCONATE 0.12 % MT SOLN
15.0000 mL | Freq: Once | OROMUCOSAL | Status: AC
  Administered 2021-04-19: 15 mL via OROMUCOSAL

## 2021-04-19 MED ORDER — SULFAMETHOXAZOLE-TRIMETHOPRIM 800-160 MG PO TABS: 1.0000 | ORAL_TABLET | Freq: Two times a day (BID) | ORAL | 0 refills | Status: DC

## 2021-04-19 MED ORDER — PROPOFOL 10 MG/ML IV BOLUS
INTRAVENOUS | Status: DC | PRN
  Administered 2021-04-19: 200 mg via INTRAVENOUS

## 2021-04-19 MED ORDER — 0.9 % SODIUM CHLORIDE (POUR BTL) OPTIME
TOPICAL | Status: DC | PRN
  Administered 2021-04-19: 1000 mL

## 2021-04-19 MED ORDER — FENTANYL CITRATE PF 50 MCG/ML IJ SOSY: 25.0000 ug | PREFILLED_SYRINGE | INTRAMUSCULAR | Status: DC | PRN

## 2021-04-19 MED ORDER — PHENYLEPHRINE 40 MCG/ML (10ML) SYRINGE FOR IV PUSH (FOR BLOOD PRESSURE SUPPORT)
PREFILLED_SYRINGE | INTRAVENOUS | Status: AC
  Filled 2021-04-19: qty 10

## 2021-04-19 MED ORDER — ORAL CARE MOUTH RINSE: 15.0000 mL | Freq: Once | OROMUCOSAL | Status: AC

## 2021-04-19 MED ORDER — PHENYLEPHRINE HCL (PRESSORS) 10 MG/ML IV SOLN
INTRAVENOUS | Status: DC | PRN
  Administered 2021-04-19 (×4): 100 ug via INTRAVENOUS

## 2021-04-19 MED ORDER — FENTANYL CITRATE (PF) 100 MCG/2ML IJ SOLN
INTRAMUSCULAR | Status: DC | PRN
  Administered 2021-04-19: 100 ug via INTRAVENOUS

## 2021-04-19 MED ORDER — KETOROLAC TROMETHAMINE 30 MG/ML IJ SOLN
30.0000 mg | Freq: Once | INTRAMUSCULAR | Status: AC
Start: 2021-04-19 — End: 2021-04-19
  Administered 2021-04-19: 30 mg via INTRAVENOUS
  Filled 2021-04-19: qty 1

## 2021-04-19 MED ORDER — KETOROLAC TROMETHAMINE 30 MG/ML IJ SOLN
INTRAMUSCULAR | Status: AC
  Filled 2021-04-19: qty 1

## 2021-04-19 MED ORDER — MIDAZOLAM HCL 5 MG/5ML IJ SOLN
INTRAMUSCULAR | Status: DC | PRN
  Administered 2021-04-19: 2 mg via INTRAVENOUS

## 2021-04-19 MED ORDER — POVIDONE-IODINE 10 % OINT PACKET
TOPICAL_OINTMENT | CUTANEOUS | Status: DC | PRN
  Administered 2021-04-19: 1 via TOPICAL

## 2021-04-19 MED ORDER — DEXTROSE 50 % IV SOLN
INTRAVENOUS | Status: AC
  Filled 2021-04-19: qty 50

## 2021-04-19 MED ORDER — PROPOFOL 10 MG/ML IV BOLUS
INTRAVENOUS | Status: AC
Start: 2021-04-19 — End: ?
  Filled 2021-04-19: qty 20

## 2021-04-19 MED ORDER — ONDANSETRON HCL 4 MG/2ML IJ SOLN: 4.0000 mg | Freq: Once | INTRAMUSCULAR | Status: DC | PRN

## 2021-04-19 MED ORDER — ONDANSETRON HCL 4 MG/2ML IJ SOLN
INTRAMUSCULAR | Status: DC | PRN
  Administered 2021-04-19: 4 mg via INTRAVENOUS

## 2021-04-19 MED ORDER — VANCOMYCIN HCL IN DEXTROSE 1-5 GM/200ML-% IV SOLN
1000.0000 mg | INTRAVENOUS | Status: AC
  Administered 2021-04-19: 1000 mg via INTRAVENOUS

## 2021-04-19 MED ORDER — CHLORHEXIDINE GLUCONATE CLOTH 2 % EX PADS: 6.0000 | MEDICATED_PAD | Freq: Once | CUTANEOUS | Status: DC

## 2021-04-19 MED ORDER — DEXMEDETOMIDINE (PRECEDEX) IN NS 20 MCG/5ML (4 MCG/ML) IV SYRINGE
PREFILLED_SYRINGE | INTRAVENOUS | Status: DC | PRN
  Administered 2021-04-19: 12 ug via INTRAVENOUS

## 2021-04-19 MED ORDER — POVIDONE-IODINE 10 % EX OINT
TOPICAL_OINTMENT | CUTANEOUS | Status: AC
  Filled 2021-04-19: qty 1

## 2021-04-19 MED ORDER — BUPIVACAINE HCL (PF) 0.5 % IJ SOLN
INTRAMUSCULAR | Status: DC | PRN
Start: 2021-04-19 — End: 2021-04-19
  Administered 2021-04-19: 20 mL
  Administered 2021-04-19: 3 mL

## 2021-04-19 MED ORDER — BUPIVACAINE HCL (PF) 0.5 % IJ SOLN
INTRAMUSCULAR | Status: AC
  Filled 2021-04-19: qty 30

## 2021-04-19 MED ORDER — FENTANYL CITRATE (PF) 100 MCG/2ML IJ SOLN
INTRAMUSCULAR | Status: AC
  Filled 2021-04-19: qty 2

## 2021-04-19 MED ORDER — DEXTROSE 50 % IV SOLN
25.0000 mL | Freq: Once | INTRAVENOUS | Status: AC
  Administered 2021-04-19: 25 mL via INTRAVENOUS

## 2021-04-19 MED ORDER — VANCOMYCIN HCL IN DEXTROSE 1-5 GM/200ML-% IV SOLN
INTRAVENOUS | Status: AC
  Filled 2021-04-19: qty 200

## 2021-04-19 MED ORDER — LACTATED RINGERS IV SOLN: INTRAVENOUS | Status: DC

## 2021-04-19 MED ORDER — MIDAZOLAM HCL 2 MG/2ML IJ SOLN
INTRAMUSCULAR | Status: AC
  Filled 2021-04-19: qty 2

## 2021-04-19 MED ORDER — KETOROLAC TROMETHAMINE 30 MG/ML IJ SOLN
INTRAMUSCULAR | Status: DC | PRN
Start: 2021-04-19 — End: 2021-04-19
  Administered 2021-04-19: 30 mg via INTRAVENOUS

## 2021-04-19 MED ORDER — ONDANSETRON HCL 4 MG/2ML IJ SOLN
INTRAMUSCULAR | Status: AC
  Filled 2021-04-19: qty 2

## 2021-04-19 SURGICAL SUPPLY — 27 items
BANDAGE GAUZE ELAST BULKY 4 IN (GAUZE/BANDAGES/DRESSINGS) ×1 IMPLANT
BLADE OSC/SAG 18.5X9 THN (BLADE) ×1 IMPLANT
BNDG CONFORM 2 STRL LF (GAUZE/BANDAGES/DRESSINGS) ×1 IMPLANT
BNDG ELASTIC 4X5.8 VLCR STR LF (GAUZE/BANDAGES/DRESSINGS) ×1 IMPLANT
CLOTH BEACON ORANGE TIMEOUT ST (SAFETY) ×2 IMPLANT
COVER LIGHT HANDLE STERIS (MISCELLANEOUS) ×4 IMPLANT
ELECT REM PT RETURN 9FT ADLT (ELECTROSURGICAL) ×2
ELECTRODE REM PT RTRN 9FT ADLT (ELECTROSURGICAL) ×1 IMPLANT
GAUZE 4X4 16PLY ~~LOC~~+RFID DBL (SPONGE) ×1 IMPLANT
GAUZE SPONGE 4X4 12PLY STRL (GAUZE/BANDAGES/DRESSINGS) ×1 IMPLANT
GAUZE XEROFORM 1X8 LF (GAUZE/BANDAGES/DRESSINGS) ×1 IMPLANT
GLOVE SURG POLYISO LF SZ7.5 (GLOVE) ×2 IMPLANT
GLOVE SURG UNDER POLY LF SZ7 (GLOVE) ×4 IMPLANT
GOWN STRL REUS W/TWL LRG LVL3 (GOWN DISPOSABLE) ×4 IMPLANT
KIT TURNOVER KIT A (KITS) ×2 IMPLANT
MANIFOLD NEPTUNE II (INSTRUMENTS) ×2 IMPLANT
NDL HYPO 25X1 1.5 SAFETY (NEEDLE) ×1 IMPLANT
NEEDLE HYPO 25X1 1.5 SAFETY (NEEDLE) ×2 IMPLANT
PACK BASIC LIMB (CUSTOM PROCEDURE TRAY) ×1 IMPLANT
PAD ARMBOARD 7.5X6 YLW CONV (MISCELLANEOUS) ×2 IMPLANT
PENCIL HANDSWITCHING (ELECTRODE) ×1 IMPLANT
RASP SM TEAR CROSS CUT (RASP) ×1 IMPLANT
SET BASIN LINEN APH (SET/KITS/TRAYS/PACK) ×2 IMPLANT
SOL PREP PROV IODINE SCRUB 4OZ (MISCELLANEOUS) IMPLANT
STAPLER VISISTAT (STAPLE) IMPLANT
SUT PROLENE 2 0 FS (SUTURE) ×4 IMPLANT
SYR CONTROL 10ML LL (SYRINGE) ×2 IMPLANT

## 2021-04-19 NOTE — Anesthesia Preprocedure Evaluation (Signed)
Anesthesia Evaluation  ?Patient identified by MRN, date of birth, ID band ?Patient awake ? ? ? ?Reviewed: ?Allergy & Precautions, H&P , NPO status , Patient's Chart, lab work & pertinent test results, reviewed documented beta blocker date and time  ? ?Airway ?Mallampati: II ? ?TM Distance: >3 FB ?Neck ROM: full ? ? ? Dental ?no notable dental hx. ? ?  ?Pulmonary ?neg pulmonary ROS, former smoker,  ?  ?Pulmonary exam normal ?breath sounds clear to auscultation ? ? ? ? ? ? Cardiovascular ?Exercise Tolerance: Good ?hypertension, negative cardio ROS ? ? ?Rhythm:regular Rate:Normal ? ? ?  ?Neuro/Psych ? Headaches, negative psych ROS  ? GI/Hepatic ?Neg liver ROS, PUD, GERD  Medicated,  ?Endo/Other  ?negative endocrine ROSdiabetes ? Renal/GU ?negative Renal ROS  ?negative genitourinary ?  ?Musculoskeletal ? ? Abdominal ?  ?Peds ? Hematology ?negative hematology ROS ?(+)   ?Anesthesia Other Findings ? ? Reproductive/Obstetrics ?negative OB ROS ? ?  ? ? ? ? ? ? ? ? ? ? ? ? ? ?  ?  ? ? ? ? ? ? ? ? ?Anesthesia Physical ?Anesthesia Plan ? ?ASA: 3 ? ?Anesthesia Plan: General and General LMA  ? ?Post-op Pain Management:   ? ?Induction:  ? ?PONV Risk Score and Plan: Ondansetron ? ?Airway Management Planned:  ? ?Additional Equipment:  ? ?Intra-op Plan:  ? ?Post-operative Plan:  ? ?Informed Consent: I have reviewed the patients History and Physical, chart, labs and discussed the procedure including the risks, benefits and alternatives for the proposed anesthesia with the patient or authorized representative who has indicated his/her understanding and acceptance.  ? ? ? ?Dental Advisory Given ? ?Plan Discussed with: CRNA ? ?Anesthesia Plan Comments:   ? ? ? ? ? ? ?Anesthesia Quick Evaluation ? ?

## 2021-04-19 NOTE — Progress Notes (Signed)
Given and applied post op shoe ?

## 2021-04-19 NOTE — Anesthesia Procedure Notes (Signed)
Procedure Name: LMA Insertion ?Date/Time: 04/19/2021 8:55 AM ?Performed by: Jonna Munro, CRNA ?Pre-anesthesia Checklist: Patient identified, Emergency Drugs available, Suction available, Patient being monitored and Timeout performed ?Patient Re-evaluated:Patient Re-evaluated prior to induction ?Oxygen Delivery Method: Circle system utilized ?Preoxygenation: Pre-oxygenation with 100% oxygen ?Induction Type: IV induction ?LMA: LMA inserted ?LMA Size: 4.0 ?Number of attempts: 1 ?Placement Confirmation: positive ETCO2 and breath sounds checked- equal and bilateral ?Tube secured with: Tape ?Dental Injury: Teeth and Oropharynx as per pre-operative assessment  ? ? ? ? ?

## 2021-04-19 NOTE — Discharge Instructions (Signed)
Keep dressing on Right foot until follow up appointment, wear post op shoe ?

## 2021-04-19 NOTE — Transfer of Care (Signed)
Immediate Anesthesia Transfer of Care Note ? ?Patient: [name] ? ?Procedure(s) Performed: IRRIGATION AND DEBRIDEMENT SECONDARY CLOSURE OF RIGHT GREAT TOE WOUND (Right) ?IRRIGATION AND DEBRIDEMENT RIGHT SECOND TOE (Right: Foot) ? ?Patient Location: PACU ? ?Anesthesia Type:General ? ?Level of Consciousness: awake, alert , oriented and patient cooperative ? ?Airway & Oxygen Therapy: Patient Spontanous Breathing and Patient connected to nasal cannula oxygen ? ?Post-op Assessment: Report given to RN, Post -op Vital signs reviewed and stable and Patient moving all extremities X 4 ? ?Post vital signs: Reviewed and stable ? ?Last Vitals:  ?Vitals Value Taken Time  ?BP 130/73 04/19/21 1000  ?Temp    ?Pulse 83 04/19/21 1002  ?Resp 13 04/19/21 1002  ?SpO2 92 % 04/19/21 1002  ?Vitals shown include unvalidated device data. ? ?Last Pain:  ?Vitals:  ? 04/19/21 0822  ?PainSc: 8   ?   ? ?Patients Stated Pain Goal: 5 (04/19/21 1700) ? ?Complications: No notable events documented. ?

## 2021-04-19 NOTE — Anesthesia Postprocedure Evaluation (Signed)
Anesthesia Post Note ? ?Patient: [name] ? ?Procedure(s) Performed: IRRIGATION AND DEBRIDEMENT SECONDARY CLOSURE OF RIGHT GREAT TOE WOUND (Right) ?IRRIGATION AND DEBRIDEMENT RIGHT SECOND TOE (Right: Foot) ? ?Patient location during evaluation: Phase II ?Anesthesia Type: General ?Level of consciousness: awake ?Pain management: pain level controlled ?Vital Signs Assessment: post-procedure vital signs reviewed and stable ?Respiratory status: spontaneous breathing and respiratory function stable ?Cardiovascular status: blood pressure returned to baseline and stable ?Postop Assessment: no headache and no apparent nausea or vomiting ?Anesthetic complications: no ?Comments: Late entry ? ? ?No notable events documented. ? ? ?Last Vitals:  ?Vitals:  ? 04/19/21 1030 04/19/21 1046  ?BP: 128/73 129/72  ?Pulse: 80 79  ?Resp: 13 13  ?Temp:  36.6 ?C  ?SpO2: 97% 95%  ?  ?Last Pain:  ?Vitals:  ? 04/19/21 1046  ?TempSrc: Oral  ?PainSc: 0-No pain  ? ? ?  ?  ?  ?  ?  ?  ? ?Louann Sjogren ? ? ? ? ?

## 2021-04-19 NOTE — Op Note (Signed)
Patient:  [name] ? ?DOB:  05/22/1960 ? ?MRN:  [id number] ? ? ?Preop Diagnosis: Status post right great toe amputation, wound breakdown and cellulitis ? ?Postop Diagnosis: Same, right second toe cellulitis ? ?Procedure: Debridement of right great toe amputation site, debridement of right second toe ? ?Surgeon: Aviva Signs, MD ? ?Anes: General ? ?Indications: Patient is a 61 year old diabetic male who has had multiple toe amputations in the past due to peripheral vascular disease who now presents with poor wound healing of the right great toe amputation site as well as a new open wound on the distal aspect of the right second toe.  The risks and benefits of the procedures including bleeding, infection, and the possibility of further surgery including amputation were fully explained to the patient, who gave informed consent. ? ?Procedure note: The patient was placed in the supine position.  After general anesthesia was administered, the right foot was prepped and draped using the usual sterile technique with Betadine.  Surgical site confirmation was performed. ? ?The right great toe amputation site had serous drainage present.  There was exposed bone of the right great tarsal.  Some granulomatous tissue was present.  The granulation tissue was debrided.  I did also cut the bone back in order to facilitate closure of the wound.  In addition, the patient had a right second toe distal tuft ulceration.  This was debrided and the distal tuft bone was excised.  This was sent to pathology further examination.  Both wounds were irrigated with normal saline.  Both wounds were injected with 0.5% Sensorcaine.  Xeroform was placed in the base of both wounds.  The right great toe amputation site was reapproximated using 2-0 Prolene vertical mattress sutures.  Betadine ointment and a dry sterile dressing were applied. ? ?All tape and needle counts were correct at the end of the procedure.  The patient was awakened and  transferred to PACU in stable condition. ? ?Complications: None ? ?EBL: Minimal ? ?Specimen: Right great tarsal bone remnants ? ? ?  ?

## 2021-04-19 NOTE — Interval H&P Note (Signed)
History and Physical Interval Note: ? ?04/19/2021 ?8:20 AM ? ?[name]  has presented today for surgery, with the diagnosis of DEHISENCE OF SURGICAL WOUND RIGHT FOOT.  The various methods of treatment have been discussed with the patient and family. After consideration of risks, benefits and other options for treatment, the patient has consented to  Procedure(s): ?SECONDARY CLOSURE OF WOUND (Right) as a surgical intervention.  The patient's history has been reviewed, patient examined, no change in status, stable for surgery.  I have reviewed the patient's chart and labs.  Questions were answered to the patient's satisfaction.   ? ? ?Aviva Signs ? ? ?

## 2021-04-22 ENCOUNTER — Encounter (HOSPITAL_COMMUNITY): Payer: Self-pay | Admitting: General Surgery

## 2021-04-22 LAB — SURGICAL PATHOLOGY

## 2021-04-25 ENCOUNTER — Ambulatory Visit (INDEPENDENT_AMBULATORY_CARE_PROVIDER_SITE_OTHER): Payer: Medicare Other | Admitting: General Surgery

## 2021-04-25 ENCOUNTER — Encounter: Payer: Self-pay | Admitting: General Surgery

## 2021-04-25 VITALS — BP 138/80 | HR 102 | Temp 97.5°F | Resp 14 | Ht 69.0 in | Wt 193.0 lb

## 2021-04-25 DIAGNOSIS — Z09 Encounter for follow-up examination after completed treatment for conditions other than malignant neoplasm: Secondary | ICD-10-CM

## 2021-04-25 NOTE — Progress Notes (Signed)
Subjective:  ?  ? [name]  ?Patient here for postoperative visit, status post revision of right great toe amputation site.  Patient states his pain is a lot less since the surgery.  He has not changed the dressing. ?Objective:  ? ? BP 138/80   Pulse (!) 102   Temp (!) 97.5 ?F (36.4 ?C) (Other (Comment))   Resp 14   Ht '5\' 9"'$  (1.753 m)   Wt 193 lb (87.5 kg)   SpO2 94%   BMI 28.50 kg/m?  ? ?General:  alert, cooperative, and no distress  ?Right foot incision is healing well.  Packing removed from great toe revision site and second toe.  Second toe still swollen and slightly erythematous.  Redressed. ?   ? ?Assessment:  ? ? Doing well postoperatively.  ?  ?Plan:  ? ?Keep right foot clean and dry with soap and water daily.  Continue heel ambulation.  Follow-up here in 2 weeks.  I am concerned that he may end up losing his right second toe.  We will reassess at that time. ?

## 2021-05-09 ENCOUNTER — Encounter: Payer: Self-pay | Admitting: General Surgery

## 2021-05-09 ENCOUNTER — Other Ambulatory Visit: Payer: Self-pay

## 2021-05-09 ENCOUNTER — Ambulatory Visit (INDEPENDENT_AMBULATORY_CARE_PROVIDER_SITE_OTHER): Payer: Medicare Other | Admitting: General Surgery

## 2021-05-09 VITALS — BP 154/90 | HR 96 | Temp 97.6°F | Resp 14 | Ht 69.0 in | Wt 194.0 lb

## 2021-05-09 DIAGNOSIS — Z09 Encounter for follow-up examination after completed treatment for conditions other than malignant neoplasm: Secondary | ICD-10-CM

## 2021-05-09 MED ORDER — SULFAMETHOXAZOLE-TRIMETHOPRIM 800-160 MG PO TABS: 1.0000 | ORAL_TABLET | Freq: Two times a day (BID) | ORAL | 0 refills | Status: DC

## 2021-05-09 NOTE — Progress Notes (Signed)
Subjective:  ?  ? [name]  ?Here for postoperative visit, status post revision of right great toe amputation site.  Started having pain and swelling of the right second toe over the past 24 hours.  He did have this debrided distally at the time of the original surgery.  Another ulceration has formed more proximal to the distal tuft.  No purulent drainage has been noted. ?Objective:  ? ? BP (!) 154/90   Pulse 96   Temp 97.6 ?F (36.4 ?C) (Other (Comment))   Resp 14   Ht '5\' 9"'$  (1.753 m)   Wt 194 lb (88 kg)   SpO2 93%   BMI 28.65 kg/m?  ? ?General:  alert, cooperative, and no distress  ?Right foot examination reveals a healed revision of the right great toe amputation site.  Remaining sutures were removed.  The right second toe is more swollen and erythematous with 2 areas of eschar present.  The erythema extends to the base of the right second toe.  No purulent drainage is noted. ?   ? ?Assessment:  ? ? Good healing of revision of right great toe amputation, cellulitis of right second toe with history of osteomyelitis  ?  ?Plan:  ? ?I told the patient that I am concerned that he may end up losing the right second toe.  Will start Bactrim DS 1 tablet p.o. twice daily x2 weeks.  He should keep the wound clean with soap and water daily.  Follow-up here in 3 weeks. ?

## 2021-05-22 ENCOUNTER — Other Ambulatory Visit: Payer: Self-pay

## 2021-05-22 ENCOUNTER — Inpatient Hospital Stay (HOSPITAL_COMMUNITY)
Admission: EM | Admit: 2021-05-22 | Discharge: 2021-05-27 | DRG: 240 | Disposition: A | Discharge status: Home Health | Payer: Medicare Other | Attending: Internal Medicine | Admitting: Internal Medicine

## 2021-05-22 ENCOUNTER — Emergency Department (HOSPITAL_COMMUNITY): Payer: Medicare Other

## 2021-05-22 ENCOUNTER — Encounter (HOSPITAL_COMMUNITY): Payer: Self-pay | Admitting: Emergency Medicine

## 2021-05-22 ENCOUNTER — Ambulatory Visit (INDEPENDENT_AMBULATORY_CARE_PROVIDER_SITE_OTHER): Payer: Medicare Other

## 2021-05-22 ENCOUNTER — Ambulatory Visit (INDEPENDENT_AMBULATORY_CARE_PROVIDER_SITE_OTHER): Payer: Medicare Other | Admitting: Podiatry

## 2021-05-22 DIAGNOSIS — B9620 Unspecified Escherichia coli [E. coli] as the cause of diseases classified elsewhere: Secondary | ICD-10-CM | POA: Diagnosis present

## 2021-05-22 DIAGNOSIS — E11628 Type 2 diabetes mellitus with other skin complications: Secondary | ICD-10-CM

## 2021-05-22 DIAGNOSIS — M21961 Unspecified acquired deformity of right lower leg: Secondary | ICD-10-CM | POA: Diagnosis not present

## 2021-05-22 DIAGNOSIS — I96 Gangrene, not elsewhere classified: Secondary | ICD-10-CM | POA: Diagnosis not present

## 2021-05-22 DIAGNOSIS — G8929 Other chronic pain: Secondary | ICD-10-CM | POA: Diagnosis present

## 2021-05-22 DIAGNOSIS — M86171 Other acute osteomyelitis, right ankle and foot: Secondary | ICD-10-CM | POA: Diagnosis present

## 2021-05-22 DIAGNOSIS — M79605 Pain in left leg: Secondary | ICD-10-CM | POA: Diagnosis present

## 2021-05-22 DIAGNOSIS — L03115 Cellulitis of right lower limb: Secondary | ICD-10-CM | POA: Diagnosis present

## 2021-05-22 DIAGNOSIS — E1140 Type 2 diabetes mellitus with diabetic neuropathy, unspecified: Secondary | ICD-10-CM | POA: Diagnosis present

## 2021-05-22 DIAGNOSIS — K219 Gastro-esophageal reflux disease without esophagitis: Secondary | ICD-10-CM | POA: Diagnosis present

## 2021-05-22 DIAGNOSIS — Z794 Long term (current) use of insulin: Secondary | ICD-10-CM

## 2021-05-22 DIAGNOSIS — M79671 Pain in right foot: Secondary | ICD-10-CM

## 2021-05-22 DIAGNOSIS — L97509 Non-pressure chronic ulcer of other part of unspecified foot with unspecified severity: Secondary | ICD-10-CM

## 2021-05-22 DIAGNOSIS — Z87891 Personal history of nicotine dependence: Secondary | ICD-10-CM

## 2021-05-22 DIAGNOSIS — S93126A Dislocation of metatarsophalangeal joint of unspecified lesser toe(s), initial encounter: Secondary | ICD-10-CM | POA: Diagnosis present

## 2021-05-22 DIAGNOSIS — B952 Enterococcus as the cause of diseases classified elsewhere: Secondary | ICD-10-CM | POA: Diagnosis present

## 2021-05-22 DIAGNOSIS — I10 Essential (primary) hypertension: Secondary | ICD-10-CM | POA: Diagnosis present

## 2021-05-22 DIAGNOSIS — Z89411 Acquired absence of right great toe: Secondary | ICD-10-CM

## 2021-05-22 DIAGNOSIS — M1990 Unspecified osteoarthritis, unspecified site: Secondary | ICD-10-CM | POA: Diagnosis present

## 2021-05-22 DIAGNOSIS — R52 Pain, unspecified: Secondary | ICD-10-CM | POA: Diagnosis not present

## 2021-05-22 DIAGNOSIS — T819XXA Unspecified complication of procedure, initial encounter: Secondary | ICD-10-CM | POA: Diagnosis not present

## 2021-05-22 DIAGNOSIS — M869 Osteomyelitis, unspecified: Principal | ICD-10-CM

## 2021-05-22 DIAGNOSIS — Z91040 Latex allergy status: Secondary | ICD-10-CM

## 2021-05-22 DIAGNOSIS — E11621 Type 2 diabetes mellitus with foot ulcer: Secondary | ICD-10-CM | POA: Diagnosis not present

## 2021-05-22 DIAGNOSIS — S91301A Unspecified open wound, right foot, initial encounter: Secondary | ICD-10-CM

## 2021-05-22 DIAGNOSIS — E1152 Type 2 diabetes mellitus with diabetic peripheral angiopathy with gangrene: Principal | ICD-10-CM | POA: Diagnosis present

## 2021-05-22 DIAGNOSIS — E119 Type 2 diabetes mellitus without complications: Secondary | ICD-10-CM

## 2021-05-22 DIAGNOSIS — E1169 Type 2 diabetes mellitus with other specified complication: Secondary | ICD-10-CM | POA: Diagnosis present

## 2021-05-22 DIAGNOSIS — Z79899 Other long term (current) drug therapy: Secondary | ICD-10-CM

## 2021-05-22 DIAGNOSIS — N179 Acute kidney failure, unspecified: Secondary | ICD-10-CM

## 2021-05-22 DIAGNOSIS — L089 Local infection of the skin and subcutaneous tissue, unspecified: Secondary | ICD-10-CM | POA: Diagnosis not present

## 2021-05-22 DIAGNOSIS — F419 Anxiety disorder, unspecified: Secondary | ICD-10-CM | POA: Diagnosis present

## 2021-05-22 DIAGNOSIS — E1101 Type 2 diabetes mellitus with hyperosmolarity with coma: Secondary | ICD-10-CM | POA: Diagnosis not present

## 2021-05-22 DIAGNOSIS — E1100 Type 2 diabetes mellitus with hyperosmolarity without nonketotic hyperglycemic-hyperosmolar coma (NKHHC): Secondary | ICD-10-CM | POA: Diagnosis not present

## 2021-05-22 DIAGNOSIS — G546 Phantom limb syndrome with pain: Secondary | ICD-10-CM | POA: Diagnosis present

## 2021-05-22 DIAGNOSIS — Z8249 Family history of ischemic heart disease and other diseases of the circulatory system: Secondary | ICD-10-CM | POA: Diagnosis not present

## 2021-05-22 DIAGNOSIS — Z89421 Acquired absence of other right toe(s): Secondary | ICD-10-CM

## 2021-05-22 DIAGNOSIS — E1165 Type 2 diabetes mellitus with hyperglycemia: Secondary | ICD-10-CM | POA: Diagnosis present

## 2021-05-22 DIAGNOSIS — Z833 Family history of diabetes mellitus: Secondary | ICD-10-CM

## 2021-05-22 DIAGNOSIS — Z7984 Long term (current) use of oral hypoglycemic drugs: Secondary | ICD-10-CM

## 2021-05-22 DIAGNOSIS — Z7982 Long term (current) use of aspirin: Secondary | ICD-10-CM

## 2021-05-22 HISTORY — DX: Other acute osteomyelitis, right ankle and foot: M86.171

## 2021-05-22 HISTORY — DX: Acute kidney failure, unspecified: N17.9

## 2021-05-22 LAB — CBC WITH DIFFERENTIAL/PLATELET
Abs Immature Granulocytes: 0.08 10*3/uL — ABNORMAL HIGH (ref 0.00–0.07)
Basophils Absolute: 0.1 10*3/uL (ref 0.0–0.1)
Basophils Relative: 1 %
Eosinophils Absolute: 0.3 10*3/uL (ref 0.0–0.5)
Eosinophils Relative: 3 %
HCT: 33.6 % — ABNORMAL LOW (ref 39.0–52.0)
Hemoglobin: 10.9 g/dL — ABNORMAL LOW (ref 13.0–17.0)
Immature Granulocytes: 1 %
Lymphocytes Relative: 26 %
Lymphs Abs: 2.7 10*3/uL (ref 0.7–4.0)
MCH: 27.1 pg (ref 26.0–34.0)
MCHC: 32.4 g/dL (ref 30.0–36.0)
MCV: 83.6 fL (ref 80.0–100.0)
Monocytes Absolute: 0.7 10*3/uL (ref 0.1–1.0)
Monocytes Relative: 7 %
Neutro Abs: 6.8 10*3/uL (ref 1.7–7.7)
Neutrophils Relative %: 62 %
Platelets: 417 10*3/uL — ABNORMAL HIGH (ref 150–400)
RBC: 4.02 MIL/uL — ABNORMAL LOW (ref 4.22–5.81)
RDW: 15.4 % (ref 11.5–15.5)
WBC: 10.7 10*3/uL — ABNORMAL HIGH (ref 4.0–10.5)
nRBC: 0 % (ref 0.0–0.2)

## 2021-05-22 LAB — COMPREHENSIVE METABOLIC PANEL
ALT: 27 U/L (ref 0–44)
AST: 25 U/L (ref 15–41)
Albumin: 3 g/dL — ABNORMAL LOW (ref 3.5–5.0)
Alkaline Phosphatase: 82 U/L (ref 38–126)
Anion gap: 8 (ref 5–15)
BUN: 12 mg/dL (ref 6–20)
CO2: 25 mmol/L (ref 22–32)
Calcium: 9.1 mg/dL (ref 8.9–10.3)
Chloride: 106 mmol/L (ref 98–111)
Creatinine, Ser: 1.21 mg/dL (ref 0.61–1.24)
GFR, Estimated: >60 mL/min (ref >60)
Glucose, Bld: 106 mg/dL — ABNORMAL HIGH (ref 70–99)
Potassium: 3.9 mmol/L (ref 3.5–5.1)
Sodium: 139 mmol/L (ref 135–145)
Total Bilirubin: 0.3 mg/dL (ref 0.3–1.2)
Total Protein: 6.9 g/dL (ref 6.5–8.1)

## 2021-05-22 LAB — HEMOGLOBIN A1C
Hgb A1c MFr Bld: 6.7 % — ABNORMAL HIGH (ref 4.8–5.6)
Mean Plasma Glucose: 145.59 mg/dL

## 2021-05-22 LAB — LACTIC ACID, PLASMA: Lactic Acid, Venous: 1.2 mmol/L (ref 0.5–1.9)

## 2021-05-22 MED ORDER — OXYCODONE HCL 5 MG PO TABS: 5.0000 mg | ORAL_TABLET | Freq: Four times a day (QID) | ORAL | Status: DC | PRN

## 2021-05-22 MED ORDER — DILTIAZEM HCL ER COATED BEADS 240 MG PO CP24
420.0000 mg | ORAL_CAPSULE | Freq: Every day | ORAL | Status: DC
  Administered 2021-05-23 – 2021-05-27 (×4): 420 mg via ORAL
  Filled 2021-05-22 (×4): qty 1

## 2021-05-22 MED ORDER — METRONIDAZOLE 500 MG PO TABS
500.0000 mg | ORAL_TABLET | Freq: Two times a day (BID) | ORAL | Status: DC
  Administered 2021-05-22 – 2021-05-27 (×9): 500 mg via ORAL
  Filled 2021-05-22 (×9): qty 1

## 2021-05-22 MED ORDER — VANCOMYCIN HCL IN DEXTROSE 1-5 GM/200ML-% IV SOLN: 1000.0000 mg | Freq: Two times a day (BID) | INTRAVENOUS | Status: DC

## 2021-05-22 MED ORDER — MORPHINE SULFATE (PF) 4 MG/ML IV SOLN
4.0000 mg | Freq: Once | INTRAVENOUS | Status: AC
  Administered 2021-05-22: 4 mg via INTRAVENOUS
  Filled 2021-05-22: qty 1

## 2021-05-22 MED ORDER — PANTOPRAZOLE SODIUM 40 MG PO TBEC
40.0000 mg | DELAYED_RELEASE_TABLET | Freq: Every day | ORAL | Status: DC
  Administered 2021-05-23 – 2021-05-27 (×4): 40 mg via ORAL
  Filled 2021-05-22 (×4): qty 1

## 2021-05-22 MED ORDER — ATORVASTATIN CALCIUM 10 MG PO TABS
20.0000 mg | ORAL_TABLET | Freq: Every day | ORAL | Status: DC
Start: 2021-05-23 — End: 2021-05-25
  Administered 2021-05-23 – 2021-05-25 (×3): 20 mg via ORAL
  Filled 2021-05-22 (×3): qty 2

## 2021-05-22 MED ORDER — BUPROPION HCL ER (XL) 150 MG PO TB24
300.0000 mg | ORAL_TABLET | Freq: Every day | ORAL | Status: DC
  Administered 2021-05-23 – 2021-05-27 (×4): 300 mg via ORAL
  Filled 2021-05-22: qty 1
  Filled 2021-05-22 (×3): qty 2

## 2021-05-22 MED ORDER — TIZANIDINE HCL 2 MG PO TABS
4.0000 mg | ORAL_TABLET | Freq: Three times a day (TID) | ORAL | Status: DC | PRN
  Administered 2021-05-24 – 2021-05-27 (×6): 4 mg via ORAL
  Filled 2021-05-22 (×6): qty 2

## 2021-05-22 MED ORDER — GABAPENTIN 400 MG PO CAPS
800.0000 mg | ORAL_CAPSULE | Freq: Three times a day (TID) | ORAL | Status: DC
  Administered 2021-05-22 – 2021-05-27 (×13): 800 mg via ORAL
  Filled 2021-05-22 (×15): qty 2

## 2021-05-22 MED ORDER — INSULIN ASPART 100 UNIT/ML IJ SOLN
0.0000 [IU] | Freq: Three times a day (TID) | INTRAMUSCULAR | Status: DC
  Administered 2021-05-23: 2 [IU] via SUBCUTANEOUS
  Administered 2021-05-23 (×2): 3 [IU] via SUBCUTANEOUS
  Administered 2021-05-24: 2 [IU] via SUBCUTANEOUS
  Administered 2021-05-24 – 2021-05-25 (×3): 5 [IU] via SUBCUTANEOUS
  Administered 2021-05-25: 3 [IU] via SUBCUTANEOUS
  Administered 2021-05-25: 5 [IU] via SUBCUTANEOUS
  Administered 2021-05-25: 8 [IU] via SUBCUTANEOUS
  Administered 2021-05-26: 3 [IU] via SUBCUTANEOUS
  Administered 2021-05-26: 11 [IU] via SUBCUTANEOUS
  Administered 2021-05-26: 8 [IU] via SUBCUTANEOUS
  Administered 2021-05-26: 2 [IU] via SUBCUTANEOUS
  Administered 2021-05-26: 3 [IU] via SUBCUTANEOUS
  Administered 2021-05-27: 11 [IU] via SUBCUTANEOUS
  Administered 2021-05-27: 15 [IU] via SUBCUTANEOUS
  Administered 2021-05-27: 11 [IU] via SUBCUTANEOUS

## 2021-05-22 MED ORDER — OXYCODONE-ACETAMINOPHEN 5-325 MG PO TABS
1.0000 | ORAL_TABLET | Freq: Four times a day (QID) | ORAL | Status: DC | PRN
  Administered 2021-05-23 (×3): 1 via ORAL
  Filled 2021-05-22 (×3): qty 1

## 2021-05-22 MED ORDER — VANCOMYCIN HCL 2000 MG/400ML IV SOLN
2000.0000 mg | Freq: Once | INTRAVENOUS | Status: AC
  Administered 2021-05-22: 2000 mg via INTRAVENOUS
  Filled 2021-05-22 (×2): qty 400

## 2021-05-22 MED ORDER — DULOXETINE HCL 60 MG PO CPEP
60.0000 mg | ORAL_CAPSULE | Freq: Every day | ORAL | Status: DC
  Administered 2021-05-23 – 2021-05-27 (×4): 60 mg via ORAL
  Filled 2021-05-22 (×4): qty 1

## 2021-05-22 MED ORDER — SODIUM CHLORIDE 0.9 % IV SOLN: INTRAVENOUS | Status: AC

## 2021-05-22 MED ORDER — DILTIAZEM HCL ER BEADS 300 MG PO CP24: 420.0000 mg | ORAL_CAPSULE | Freq: Every day | ORAL | Status: DC

## 2021-05-22 MED ORDER — AMLODIPINE BESYLATE 5 MG PO TABS
5.0000 mg | ORAL_TABLET | Freq: Every day | ORAL | Status: DC
  Administered 2021-05-23 – 2021-05-27 (×4): 5 mg via ORAL
  Filled 2021-05-22 (×4): qty 1

## 2021-05-22 MED ORDER — SODIUM CHLORIDE 0.9 % IV SOLN
2.0000 g | INTRAVENOUS | Status: DC
  Administered 2021-05-22 – 2021-05-26 (×5): 2 g via INTRAVENOUS
  Filled 2021-05-22 (×5): qty 20

## 2021-05-22 MED ORDER — ALPRAZOLAM 0.25 MG PO TABS
0.5000 mg | ORAL_TABLET | Freq: Three times a day (TID) | ORAL | Status: DC
  Administered 2021-05-22 – 2021-05-27 (×13): 0.5 mg via ORAL
  Filled 2021-05-22 (×13): qty 2

## 2021-05-22 MED ORDER — SODIUM CHLORIDE 0.9 % IV BOLUS
1000.0000 mL | Freq: Once | INTRAVENOUS | Status: AC
  Administered 2021-05-22: 1000 mL via INTRAVENOUS

## 2021-05-22 MED ORDER — OXYCODONE-ACETAMINOPHEN 10-325 MG PO TABS: 1.0000 | ORAL_TABLET | Freq: Four times a day (QID) | ORAL | Status: DC | PRN

## 2021-05-22 MED ORDER — SODIUM CHLORIDE 0.9 % IV SOLN: INTRAVENOUS | Status: DC

## 2021-05-22 NOTE — Assessment & Plan Note (Addendum)
Recent Labs  ?  08/28/20 ?0305 08/29/20 ?0522 08/30/20 ?8502 01/26/21 ?1119 04/17/21 ?1530 05/22/21 ?1529 05/23/21 ?7741 05/24/21 ?2878 05/25/21 ?0100 05/26/21 ?0100  ?BUN 24* 22* '15 19 16 12 14 12 8 12  '$ ?CREATININE 0.97 0.98 0.77 0.92 0.90 1.21 0.89 0.82 0.78 0.66  ?Resolved.  Continue holding losartan and Lasix.  Avoid nephrotoxic meds. ?

## 2021-05-22 NOTE — Progress Notes (Signed)
Pharmacy Antibiotic Note ? ?[name] is a 61 y.o. male admitted on 05/22/2021 with  diabetic foot infection .  Pharmacy has been consulted for Vancomycin dosing. ? ?Plan: ?Vancomycin 2000 mg IV x 1, followed by 1000 mg IV q12h (eAUC 519.4, Scr 1.21, goal AUC 400-550) ?Ceftriaxone 2 g IV q24h  ?Flagyl 500 mg PO q12h ?Follow-up clinical status, renal function ?Follow-up cultures, LOT, narrow as able  ?Obtain Vancomycin levels as appropriate  ? ?Height: '5\' 9"'$  (175.3 cm) ?Weight: 90.7 kg (200 lb) ?IBW/kg (Calculated) : 70.7 ? ?Temp (24hrs), Avg:98.9 ?F (37.2 ?C), Min:98.9 ?F (37.2 ?C), Max:98.9 ?F (37.2 ?C) ? ?Recent Labs  ?Lab 05/22/21 ?1529  ?WBC 10.7*  ?CREATININE 1.21  ?  ?Estimated Creatinine Clearance: 73.2 mL/min (by C-G formula based on SCr of 1.21 mg/dL).   ? ?Allergies  ?Allergen Reactions  ? Latex Rash and Other (See Comments)  ?  Redness   ? ? ?Antimicrobials this admission: ?Vancomycin 5/3 >>  ?Ceftriaxone 5/3 >>  ?Flagyl 5/3 >> ? ?Microbiology results: ?5/3 BCx: pending ? ? ?Thank you for allowing pharmacy to be a part of this patient?s care. ? ?Vance Peper, PharmD ?PGY1 Pharmacy Resident ?05/22/2021 6:41 PM  ? ?Please check AMION for all [facility] phone numbers ?After 10:00 PM, call [facility] [phone] ? ? ?

## 2021-05-22 NOTE — Assessment & Plan Note (Addendum)
A1c 6.7%.  Home regimen Basaglar 45 units twice daily, metformin and Januvia. ?Recent Labs  ?Lab 05/26/21 ?0317 05/26/21 ?0801 05/26/21 ?1134 05/26/21 ?1230 05/26/21 ?1558  ?GLUCAP 155* 134* 151* 151* 258*  ?-Continue SSI-moderate ?-Add Semglee 10 units daily ?-Further adjustment as appropriate. ?-Continue gabapentin and Lipitor. ?

## 2021-05-22 NOTE — ED Provider Notes (Signed)
?[name] ?Provider Note ? ? ?CSN: [id number] ?Arrival date & time: 05/22/21  1418 ? ?  ? ?History ? ?Chief Complaint  ?Patient presents with  ? Wound Infection  ? ? ?[name] is a 61 y.o. male. ? ?The history is provided by the patient and medical records. No language interpreter was used.  ? ?61 year old male significant history of diabetes, chronic pain, recurrent osteomyelitis of the right foot status post toe amputation who presents with complaints of right foot pain. ?Patient has had recurrent infection involving his right foot and has had multiple toes amputations with most recent amputation on 04/19/2021.  He reported foot pain has become progressively worse.  Pain is throbbing with increased swelling and redness extending towards his ankle.  No associated fever chills and denies any recent injury.  States that his surgeon, Dr. Arnoldo Morale, has evaluated his foot 2 weeks ago and did not provide any specific treatment.  He voices frustration and was seen by a podiatrist today for his foot pain.  After evaluation he was told to come to the office to be admitted for  further work-up. ? ? ? ? ?Home Medications ?Prior to Admission medications   ?Medication Sig Start Date End Date Taking? Authorizing Provider  ?ALPRAZolam (XANAX) 0.5 MG tablet Take 0.5 mg by mouth in the morning and at bedtime. 02/25/15   [provider]  ?amLODipine (NORVASC) 5 MG tablet Take 5 mg by mouth daily.    [provider]  ?aspirin EC 81 MG tablet Take 1 tablet (81 mg total) by mouth daily with breakfast. 06/22/19   Denton Brick, Courage, MD  ?atorvastatin (LIPITOR) 20 MG tablet Take 20 mg by mouth daily. 03/31/19   [provider]  ?buPROPion (WELLBUTRIN XL) 300 MG 24 hr tablet Take 300 mg by mouth daily. 08/06/20   [provider]  ?diltiazem (TIAZAC) 420 MG 24 hr capsule Take 420 mg by mouth daily. 02/20/19   [provider]  ?DULoxetine (CYMBALTA) 30 MG  capsule Take 30 mg by mouth at bedtime. 07/13/20   [provider]  ?furosemide (LASIX) 20 MG tablet Take 20 mg by mouth daily. 11/25/19   [provider]  ?gabapentin (NEURONTIN) 800 MG tablet Take 800 mg by mouth 3 (three) times daily. 08/29/19   [provider]  ?insulin aspart (NOVOLOG) 100 UNIT/ML FlexPen Inject 0-10 Units into the skin 3 (three) times daily with meals. insulin aspart (novoLOG) injection 0-10 Units 0-10 Units Subcutaneous, 3 times daily with meals CBG < 70: Implement Hypoglycemia Standing Orders and refer to Hypoglycemia Standing Orders sidebar report  CBG 70 - 120: 0 unit CBG 121 - 150: 0 unit  CBG 151 - 200: 1 unit CBG 201 - 250: 2 units CBG 251 - 300: 4 units CBG 301 - 350: 6 units  CBG 351 - 400: 8 units  CBG > 400: 10 units 06/22/19   Emokpae, Courage, MD  ?Menthol-Methyl Salicylate (MUSCLE RUB) 10-15 % CREA Apply 1 application topically 4 (four) times daily as needed for muscle pain.    [provider]  ?metFORMIN (GLUCOPHAGE) 500 MG tablet Take 1,000 mg by mouth in the morning and at bedtime. In the morning & 1300    [provider]  ?Multiple Vitamin (MULTIVITAMIN WITH MINERALS) TABS tablet Take 1 tablet by mouth daily.    [provider]  ?naproxen (NAPROSYN) 500 MG tablet Take 500 mg by mouth 2 (two) times daily as needed. 06/19/20  [provider]  ?pantoprazole (PROTONIX) 40 MG tablet TAKE 1 TABLET(40 MG) BY MOUTH DAILY ?Patient taking differently: Take 40 mg by mouth daily. 04/30/20   Arnoldo Lenis, MD  ?sulfamethoxazole-trimethoprim (BACTRIM DS) 800-160 MG tablet Take 1 tablet by mouth 2 (two) times daily. 05/09/21   Aviva Signs, MD  ?tiZANidine (ZANAFLEX) 4 MG tablet Take 1 tablet (4 mg total) by mouth every 8 (eight) hours as needed for muscle spasms. 10/13/19   Viona Gilmore D, NP  ?   ? ?Allergies    ?Latex   ? ?Review of Systems   ?Review of Systems  ?All other systems reviewed and are negative. ? ?Physical  Exam ?Updated Vital Signs ?BP 130/74 (BP Location: Right Arm)   Pulse 93   Temp 98.9 ?F (37.2 ?C) (Oral)   Ht '5\' 9"'$  (1.753 m)   Wt 90.7 kg   SpO2 98%   BMI 29.53 kg/m?  ?Physical Exam ?Vitals and nursing note reviewed.  ?Constitutional:   ?   General: He is not in acute distress. ?   Appearance: He is well-developed.  ?HENT:  ?   Head: Atraumatic.  ?Eyes:  ?   Conjunctiva/sclera: Conjunctivae normal.  ?Cardiovascular:  ?   Rate and Rhythm: Normal rate and regular rhythm.  ?   Pulses: Normal pulses.  ?   Heart sounds: Normal heart sounds.  ?Musculoskeletal:     ?   General: Tenderness (Right foot: Foot ankle is moderately erythematous, edematous, and tender to palpation.  Missing second, fourth and fifth toe.  third toe with necrotic tip.  Difficult to palpate pedal pulse) present.  ?   Cervical back: Neck supple.  ?Skin: ?   Findings: No rash.  ?Neurological:  ?   Mental Status: He is alert.  ? ? ?ED Results / Procedures / Treatments   ?Labs ?(all labs ordered are listed, but only abnormal results are displayed) ?Labs Reviewed  ?COMPREHENSIVE METABOLIC PANEL - Abnormal; Notable for the following components:  ?    Result Value  ? Glucose, Bld 106 (*)   ? Albumin 3.0 (*)   ? All other components within normal limits  ?CBC WITH DIFFERENTIAL/PLATELET - Abnormal; Notable for the following components:  ? WBC 10.7 (*)   ? RBC 4.02 (*)   ? Hemoglobin 10.9 (*)   ? HCT 33.6 (*)   ? Platelets 417 (*)   ? Abs Immature Granulocytes 0.08 (*)   ? All other components within normal limits  ?CULTURE, BLOOD (ROUTINE X 2)  ?CULTURE, BLOOD (ROUTINE X 2)  ?LACTIC ACID, PLASMA  ? ? ?EKG ?None ? ?Radiology ?DG Foot Complete Right ? ?Result Date: 05/22/2021 ?CLINICAL DATA:  Right foot pain. Possible gangrene. Recent amputation. EXAM: RIGHT FOOT COMPLETE - 3+ VIEW COMPARISON:  Right foot radiographs earlier today and 03/18/2021 FINDINGS: Sequelae of right first ray amputation are identified at the level of the proximal shaft of the  metatarsal (revised since 03/18/2021), and more remote amputations are again noted of the third and fourth toes at the level of the MTP joints. There is a fracture of the base of the proximal phalanx of the second toe which is new from 03/18/2021. There is dislocation of the second MTP joint, with the fractured fragment of the base of the proximal phalanx being displaced medially relative to the head of the second metatarsal. There is lucency along the fracture margins of both the proximal and distal fragments of the proximal phalanx. Since 03/18/2021, there is also new  destruction of the distal phalanx and distal half of the middle phalanx of the second toe with a few residual small bone fragments in this region. There is marked soft tissue swelling of the second toe. No subcutaneous emphysema is identified. Atherosclerotic vascular calcifications are noted. IMPRESSION: Sequelae of right first, third, and fourth toe amputations as above. Findings of osteomyelitis of the second toe with a displaced fracture of the base of the proximal phalanx. Electronically Signed   By: Logan Bores M.D.   On: 05/22/2021 16:34  ? ?DG Foot Complete Right ? ?Result Date: 05/22/2021 ?Please see detailed radiograph report in office note.  ? ?Procedures ?Procedures  ? ? ?Medications Ordered in ED ?Medications  ?sodium chloride 0.9 % bolus 1,000 mL (0 mLs Intravenous Stopped 05/22/21 1959)  ?  And  ?0.9 %  sodium chloride infusion ( Intravenous New Bag/Given 05/22/21 1957)  ?cefTRIAXone (ROCEPHIN) 2 g in sodium chloride 0.9 % 100 mL IVPB (0 g Intravenous Stopped 05/22/21 1956)  ?metroNIDAZOLE (FLAGYL) tablet 500 mg (has no administration in time range)  ?vancomycin (VANCOREADY) IVPB 2000 mg/400 mL (2,000 mg Intravenous New Bag/Given 05/22/21 1957)  ?vancomycin (VANCOCIN) IVPB 1000 mg/200 mL premix (has no administration in time range)  ?morphine (PF) 4 MG/ML injection 4 mg (4 mg Intravenous Given 05/22/21 1858)  ?morphine (PF) 4 MG/ML injection 4 mg  (4 mg Intravenous Given 05/22/21 2021)  ? ? ?ED Course/ Medical Decision Making/ A&P ?  ?                        ?Medical Decision Making ?Amount and/or Complexity of Data Reviewed ?Labs: ordered. ? ?Risk ?Prescription d

## 2021-05-22 NOTE — Assessment & Plan Note (Signed)
Continue Wellbutrin and Xanax ?

## 2021-05-22 NOTE — ED Triage Notes (Signed)
Pt reports nonhealing surgical wounds from toe amputation for the last 3 months. Denies recent fevers, bandage with bloody drainage. Pt states foot is extremely painful. Pt ambulatory with boot. Le ghot to touch, unable to palpate pulse at present. VSS. Pain 10/10. ?

## 2021-05-22 NOTE — Progress Notes (Signed)
? ?HPI: 61 y.o. male PMHx diabetes mellitus; controlled PSxHx prior toe amputations RT foot presenting today for second opinion of right foot cellulitis.  Patient has been treated and managed for the past several months in [place] with Dr. Aviva Signs, general surgery.  Patient presents for second opinion regarding right foot.  He says that the right foot is not getting better and he has had multiple prior amputations and surgeries.  Last surgery was revisional first ray amputation 04/19/2021.   ? ?Patient currently denies fever chills nausea vomiting shortness of breath or chest pain.  Presenting for further treatment and evaluation ? ?Past Medical History:  ?Diagnosis Date  ? Arthritis   ? Diabetes mellitus   ? GERD (gastroesophageal reflux disease)   ? Headache(784.0)   ? History of kidney stones   ? Hypertension   ? Kidney stones   ? Neuropathy   ? ? ?Past Surgical History:  ?Procedure Laterality Date  ? AMPUTATION TOE Right 06/21/2019  ? Procedure: AMPUTATION TOE;  Surgeon: Aviva Signs, MD;  Location: [facility];  Service: General;  Laterality: Right;  ? AMPUTATION TOE Right 08/29/2020  ? Procedure: RIGHT THIRD AMPUTATION TOE;  Surgeon: Aviva Signs, MD;  Location: [facility];  Service: General;  Laterality: Right;  third toe  ? AMPUTATION TOE Right 02/20/2021  ? Procedure: AMPUTATION  TRANSMATARSAL  RIGHT; GREAT TOE;  Surgeon: Aviva Signs, MD;  Location: [facility];  Service: General;  Laterality: Right;  ? BACK SURGERY  2022  ? CARDIAC CATHETERIZATION  01/21/2007  ? CHOLECYSTECTOMY N/A 10/24/2013  ? Procedure: LAPAROSCOPIC CHOLECYSTECTOMY;  Surgeon: Jamesetta So, MD;  Location: [facility];  Service: General;  Laterality: N/A;  ? COLONOSCOPY N/A 03/31/2013  ? Procedure: COLONOSCOPY;  Surgeon: Rogene Houston, MD;  [facility];  Service: Endoscopy;  Laterality: N/A;  730  ? IRRIGATION AND DEBRIDEMENT FOOT Right 04/19/2021  ? Procedure: IRRIGATION AND DEBRIDEMENT RIGHT SECOND TOE;  Surgeon: Aviva Signs,  MD;  Location: [facility];  Service: General;  Laterality: Right;  ? SECONDARY CLOSURE OF WOUND Right 04/19/2021  ? Procedure: IRRIGATION AND DEBRIDEMENT SECONDARY CLOSURE OF RIGHT GREAT TOE WOUND;  Surgeon: Aviva Signs, MD;  Location: [facility];  Service: General;  Laterality: Right;  ? ? ?Allergies  ?Allergen Reactions  ? Latex Rash and Other (See Comments)  ?  Redness   ? ?  ? ? ? ?Physical Exam: ?General: The patient is alert and oriented x3 in no acute distress. ? ?Dermatology: Gangrene noted distal half of the right second toe.  Heavy erythema and edema with seropurulent drainage with strong malodor noted right forefoot.  Plantar forefoot appears very tight with heavy edema and maceration of skin. ? ?Vascular: Skin is warm to touch.  Patient had arterial ABI completed 06/17/2019 which was WNL.  Exam is 61 years old now.  Recommend update exam ? ?Neurological: Light touch and protective threshold absent ? ?Musculoskeletal Exam: History of prior amputations. ? ?Radiographic Exam:  ?Prior amputations of the first, third, fourth toes RT foot.  Degenerative changes and cortical erosions noted throughout the forefoot.  Base of the proximal phalanx of the second toe is fractured and displaced medially.  No obvious gas noted. ? ?Assessment: ?1.  Gangrene right foot ?2.  Severe diabetic foot infection right ? ? ?Plan of Care:  ?1. Patient evaluated. X-Rays reviewed.  All pertinent labs reviewed and patient's chart reviewed ?2.  Informed the patient that he is currently suffering from a severe infection of  the right forefoot.  Recommend that he goes immediately to the emergency department for further work-up and evaluation.  I informed the patient that he will likely need a more proximal amputation while inpatient ?3.  When admitted recommend MRI RT foot w/o contrast. ABIs RLE.   ?4.  Request podiatry consult when admitted ?5.  Podiatry will follow once consulted and plan for surgical amputation pending MRI results and  work-up ?  ?  ?Edrick Kins, DPM ?[facility] ? ?Dr. Edrick Kins, DPM  ?  ?[address]                                        ?[place], [place] [address]                ?Office [phone]  ?Fax [phone] ? ? ? ? ?

## 2021-05-22 NOTE — H&P (Signed)
?History and Physical  ? ? ?Patient: [name] ZOX:[id number] DOB: [birth year] ?DOA: 05/22/2021 ?DOS: the patient was seen and examined on 05/22/2021 ?PCP: [name], MD  ?Patient coming from: Home ? ?Chief Complaint:  ?Chief Complaint  ?Patient presents with  ? Wound Infection  ? ?HPI: [name] is a 61 y.o. male with medical history significant of History of hypertension, insulin-dependent type 2 diabetes, osteomyelitis with history of multiple toe amputations presented at the recommendation of podiatry for right foot cellulitis. ? ?Patient has been treated and managed for the past several months in [place] with Dr. Aviva Signs, general surgery.  Has history of amputation of the first, third and fourth right toes with recent revision of the right great toe on 04/19/2021.  Reports that since February he has noticed a discoloration of his second toe and he has been treated with multiple rounds of antibiotics by general surgery.  Currently taking Bactrim. ?He became frustrated with lack of work-up with surgery and presented to [facility] today and saw Dr. Daylene Katayama with podiatry for second opinion of right foot cellulitis.  ? ?He was found to have an erythematous right forefoot with gangrenous distal half of the right second toe and x-ray showing fracture at the base of the proximal phalanx of the second toe and advised to present to the ED for IV antibiotics and further imaging.  ? ?He denies any fevers or chills.  Reports chronic neuropathy but has been working to control his diabetes.  He has chronic pain to both legs in the pretibial region. ?In the ED, he was afebrile normotensive room air.  Has mild leukocytosis of 10.7.  Lactate within normal limits.  Has AKI of 1.21 from a prior of 0.9. ?Review of Systems: As mentioned in the history of present illness. All other systems reviewed and are negative. ?Past Medical History:  ?Diagnosis Date  ? Arthritis   ? Diabetes mellitus   ?  GERD (gastroesophageal reflux disease)   ? Headache(784.0)   ? History of kidney stones   ? Hypertension   ? Kidney stones   ? Neuropathy   ? ?Past Surgical History:  ?Procedure Laterality Date  ? AMPUTATION TOE Right 06/21/2019  ? Procedure: AMPUTATION TOE;  Surgeon: Aviva Signs, MD;  Location[facility];  Service: General;  Laterality: Right;  ? AMPUTATION TOE Right 08/29/2020  ? Procedure: RIGHT THIRD AMPUTATION TOE;  Surgeon: Aviva Signs, MD;  [facility]: [facility];  Service: General;  Laterality: Right;  third toe  ? AMPUTATION TOE Right 02/20/2021  ? Procedure: AMPUTATION  TRANSMATARSAL  RIGHT; GREAT TOE;  Surgeon: Aviva Signs, MD;  Location: [facility];  Service: General;  Laterality: Right;  ? BACK SURGERY  2022  ? CARDIAC CATHETERIZATION  01/21/2007  ? CHOLECYSTECTOMY N/A 10/24/2013  ? Procedure: LAPAROSCOPIC CHOLECYSTECTOMY;  Surgeon: Jamesetta So, MD;  Location: [facility];  Service: General;  Laterality: N/A;  ? COLONOSCOPY N/A 03/31/2013  ? Procedure: COLONOSCOPY;  Surgeon: Rogene Houston, MD;  [facility];  Service: Endoscopy;  Laterality: N/A;  730  ? IRRIGATION AND DEBRIDEMENT FOOT Right 04/19/2021  ? Procedure: IRRIGATION AND DEBRIDEMENT RIGHT SECOND TOE;  Surgeon: Aviva Signs, MD;  [facility]: [facility];  Service: General;  Laterality: Right;  ? SECONDARY CLOSURE OF WOUND Right 04/19/2021  ? Procedure: IRRIGATION AND DEBRIDEMENT SECONDARY CLOSURE OF RIGHT GREAT TOE WOUND;  Surgeon: Aviva Signs, MD;  Location: [facility];  Service: General;  Laterality: Right;  ? ?  Social History:  reports that he quit smoking about 39 years ago. His smoking use included cigarettes. He has a 7.00 pack-year smoking history. He has never used smokeless tobacco. He reports that he does not drink alcohol and does not use drugs. ? ?Allergies  ?Allergen Reactions  ? Latex Rash and Other (See Comments)  ?  Redness   ? ? ?Family History  ?Problem Relation Age of Onset  ? Diabetes Mother   ? Heart failure Mother   ? Heart disease  Mother   ? Heart failure Father   ? ? ?Prior to Admission medications   ?Medication Sig Start Date End Date Taking? Authorizing Provider  ?AIMOVIG 140 MG/ML SOAJ Inject 140 mg into the skin every 30 (thirty) days. 05/20/21  Yes [provider]  ?ALPRAZolam Duanne Moron) 0.5 MG tablet Take 0.5 mg by mouth 3 (three) times daily. 02/25/15  Yes [provider]  ?amLODipine (NORVASC) 5 MG tablet Take 5 mg by mouth daily.   Yes [provider]  ?aspirin EC 81 MG tablet Take 1 tablet (81 mg total) by mouth daily with breakfast. 06/22/19  Yes Emokpae, Courage, MD  ?aspirin-acetaminophen-caffeine (EXCEDRIN MIGRAINE) 250-250-65 MG tablet Take 1 tablet by mouth every 6 (six) hours as needed for headache.   Yes [provider]  ?atorvastatin (LIPITOR) 20 MG tablet Take 20 mg by mouth daily. 03/31/19  Yes [provider]  ?buPROPion (WELLBUTRIN XL) 300 MG 24 hr tablet Take 300 mg by mouth daily. 08/06/20  Yes [provider]  ?diclofenac Sodium (VOLTAREN) 1 % GEL Apply 2 g topically daily as needed (pain). 03/14/21  Yes [provider]  ?diltiazem (TIAZAC) 420 MG 24 hr capsule Take 420 mg by mouth daily. 02/20/19  Yes [provider]  ?DULoxetine (CYMBALTA) 60 MG capsule Take 60 mg by mouth daily.   Yes [provider]  ?furosemide (LASIX) 20 MG tablet Take 20 mg by mouth daily. 11/25/19  Yes [provider]  ?gabapentin (NEURONTIN) 800 MG tablet Take 800 mg by mouth 3 (three) times daily. 08/29/19  Yes [provider]  ?Insulin Glargine (BASAGLAR KWIKPEN) 100 UNIT/ML Inject 45 Units into the skin 2 (two) times daily.   Yes [provider]  ?losartan (COZAAR) 100 MG tablet Take 100 mg by mouth daily. 05/10/21  Yes [provider]  ?metFORMIN (GLUCOPHAGE) 500 MG tablet Take 1,000 mg by mouth in the morning and at bedtime. In the morning & 1300   Yes [provider]  ?Multiple Vitamin (MULTIVITAMIN WITH MINERALS) TABS tablet  Take 1 tablet by mouth daily.   Yes [provider]  ?naproxen (NAPROSYN) 500 MG tablet Take 500 mg by mouth 2 (two) times daily as needed for moderate pain. 06/19/20  Yes [provider]  ?ondansetron (ZOFRAN) 4 MG tablet Take 4 mg by mouth every 8 (eight) hours as needed for nausea or vomiting. 04/25/21  Yes [provider]  ?oxyCODONE-acetaminophen (PERCOCET) 10-325 MG tablet Take 1 tablet by mouth 4 (four) times daily as needed for pain. 05/12/21  Yes [provider]  ?pantoprazole (PROTONIX) 40 MG tablet TAKE 1 TABLET(40 MG) BY MOUTH DAILY ?Patient taking differently: Take 40 mg by mouth daily. 04/30/20  Yes BranchAlphonse Guild, MD  ?sitaGLIPtin (JANUVIA) 100 MG tablet Take 100 mg by mouth daily.   Yes [provider]  ?sulfamethoxazole-trimethoprim (BACTRIM DS) 800-160 MG tablet Take 1 tablet by mouth 2 (two) times daily. ?Patient taking differently: Take 1 tablet by mouth 2 (  two) times daily. Start date : 05/09/21 05/09/21  Yes Aviva Signs, MD  ?tiZANidine (ZANAFLEX) 4 MG tablet Take 1 tablet (4 mg total) by mouth every 8 (eight) hours as needed for muscle spasms. 10/13/19  Yes Viona Gilmore D, NP  ?insulin aspart (NOVOLOG) 100 UNIT/ML FlexPen Inject 0-10 Units into the skin 3 (three) times daily with meals. insulin aspart (novoLOG) injection 0-10 Units 0-10 Units Subcutaneous, 3 times daily with meals CBG < 70: Implement Hypoglycemia Standing Orders and refer to Hypoglycemia Standing Orders sidebar report  CBG 70 - 120: 0 unit CBG 121 - 150: 0 unit  CBG 151 - 200: 1 unit CBG 201 - 250: 2 units CBG 251 - 300: 4 units CBG 301 - 350: 6 units  CBG 351 - 400: 8 units  CBG > 400: 10 units ?Patient not taking: Reported on 05/22/2021 06/22/19   Roxan Hockey, MD  ? ? ?Physical Exam: ?Vitals:  ? 05/22/21 1930 05/22/21 1952 05/22/21 2000 05/22/21 2033  ?BP: 102/81 133/78 129/78 129/78  ?Pulse: 79 81 81 81  ?Resp: '16 16 18 18  '$ ?Temp:      ?TempSrc:      ?SpO2: 99% 100% 100% 100%   ?Weight:      ?Height:      ? ?Constitutional: NAD, calm, comfortable, middle-age male sitting upright in bed eyes: lids and conjunctivae normal ?ENMT: Mucous membranes are moist.  ?Neck: normal, supp

## 2021-05-22 NOTE — Assessment & Plan Note (Addendum)
History of amputation of the 1st, 3rd and 4th toes of right foot by general surgery Dr. Arnoldo Morale.  Failed outpatient treatment with oral antibiotics.  MRI concerning for osteomyelitis in right first metatarsal stump, second and third metatarsal bone and second phalanxes.  ABI 1.52 and 1.48 in right PTA and DP respectively with triphasic waveform suggesting noncompressible vessels. ?-S/p right TMT amputation and wound VAC placement by Dr. Sherryle Lis on 5/5. ?-S/p partial resection of right metatarsal x4, washout and secondary wound closure ?-Blood cultures NGTD.  Tissue culture was E. coli and Enterococcus faecalis.  Sensitivity pending ?-Continue broad-spectrum antibiotics with vanc, CTX and Flagyl pending tissue culture sensitivity ?-Podiatry recommended oral antibiotics on discharge for additional 1 week based on sensitivity ?-NWB in a splint on right foot. ?-Optimize diabetic control ?-Increased atorvastatin to 80 mg daily. ?-Low-dose aspirin 81 mg daily ?

## 2021-05-22 NOTE — ED Provider Triage Note (Signed)
Emergency Medicine Provider Triage Evaluation Note ? ?[name] , a 61 y.o. male  was evaluated in triage.  Pt complains of right foot pain.  Patient was seen at podiatry earlier today.  Evaluated for severe infection of the right forefoot.  Podiatry recommend emergency department for further work-up and evaluation.  Probable proximal amputation while inpatient.  Podiatry recommended MRI of the right foot and ABIs for the right lower extremity.  Request medicine admission.  Request podiatry consult once admitted.  Patient has pain in right foot.  Main concern is right foot cellulitis.  Last surgery was revisional first ray amputation on March 31 of this year ? ?Review of Systems  ?Positive: Erythema, pain, right foot ?Negative: Systemic fever, nausea ? ?Physical Exam  ?BP 130/74 (BP Location: Right Arm)   Pulse 93   Temp 98.9 ?F (37.2 ?C) (Oral)   Ht '5\' 9"'$  (1.753 m)   Wt 90.7 kg   SpO2 98%   BMI 29.53 kg/m?  ?Gen:   Awake, no distress   ?Resp:  Normal effort  ?MSK:   RLE currently bandaged.  Photos from today and notes from office visit to podiatry today.  Second toe appears black.  Unable to palpate pulse in right foot ?Other:   ? ?Medical Decision Making  ?Medically screening exam initiated at 3:04 PM.  Appropriate orders placed.  [name] was informed that the remainder of the evaluation will be completed by another provider, this initial triage assessment does not replace that evaluation, and the importance of remaining in the ED until their evaluation is complete. ? ? ?  ?Dorothyann Peng, PA-C ?05/22/21 1511 ? ?

## 2021-05-22 NOTE — Assessment & Plan Note (Addendum)
Normotensive.   ?-Continue amlodipine, diltiazem ?-losartan and Lasix initially held due to AKI ?

## 2021-05-23 ENCOUNTER — Inpatient Hospital Stay (HOSPITAL_COMMUNITY): Payer: Medicare Other

## 2021-05-23 ENCOUNTER — Encounter (HOSPITAL_COMMUNITY): Payer: Self-pay | Admitting: Family Medicine

## 2021-05-23 ENCOUNTER — Telehealth: Payer: Self-pay | Admitting: Podiatry

## 2021-05-23 DIAGNOSIS — Z794 Long term (current) use of insulin: Secondary | ICD-10-CM

## 2021-05-23 DIAGNOSIS — R52 Pain, unspecified: Secondary | ICD-10-CM

## 2021-05-23 DIAGNOSIS — E1100 Type 2 diabetes mellitus with hyperosmolarity without nonketotic hyperglycemic-hyperosmolar coma (NKHHC): Secondary | ICD-10-CM | POA: Diagnosis not present

## 2021-05-23 DIAGNOSIS — M869 Osteomyelitis, unspecified: Secondary | ICD-10-CM | POA: Diagnosis not present

## 2021-05-23 LAB — GLUCOSE, CAPILLARY
Glucose-Capillary: 153 mg/dL — ABNORMAL HIGH (ref 70–99)
Glucose-Capillary: 130 mg/dL — ABNORMAL HIGH (ref 70–99)
Glucose-Capillary: 134 mg/dL — ABNORMAL HIGH (ref 70–99)

## 2021-05-23 LAB — BASIC METABOLIC PANEL
Anion gap: 6 (ref 5–15)
BUN: 14 mg/dL (ref 6–20)
CO2: 23 mmol/L (ref 22–32)
Calcium: 8 mg/dL — ABNORMAL LOW (ref 8.9–10.3)
Chloride: 110 mmol/L (ref 98–111)
Creatinine, Ser: 0.89 mg/dL (ref 0.61–1.24)
GFR, Estimated: >60 mL/min (ref >60)
Glucose, Bld: 199 mg/dL — ABNORMAL HIGH (ref 70–99)
Potassium: 3.7 mmol/L (ref 3.5–5.1)
Sodium: 139 mmol/L (ref 135–145)

## 2021-05-23 LAB — CBC
HCT: 29.9 % — ABNORMAL LOW (ref 39.0–52.0)
Hemoglobin: 9.8 g/dL — ABNORMAL LOW (ref 13.0–17.0)
MCH: 27.4 pg (ref 26.0–34.0)
MCHC: 32.8 g/dL (ref 30.0–36.0)
MCV: 83.5 fL (ref 80.0–100.0)
Platelets: 344 10*3/uL (ref 150–400)
RBC: 3.58 MIL/uL — ABNORMAL LOW (ref 4.22–5.81)
RDW: 15.6 % — ABNORMAL HIGH (ref 11.5–15.5)
WBC: 8.4 10*3/uL (ref 4.0–10.5)
nRBC: 0 % (ref 0.0–0.2)

## 2021-05-23 LAB — CBG MONITORING, ED
Glucose-Capillary: 180 mg/dL — ABNORMAL HIGH (ref 70–99)
Glucose-Capillary: 101 mg/dL — ABNORMAL HIGH (ref 70–99)
Glucose-Capillary: 105 mg/dL — ABNORMAL HIGH (ref 70–99)

## 2021-05-23 MED ORDER — VANCOMYCIN HCL 1250 MG/250ML IV SOLN
1250.0000 mg | Freq: Two times a day (BID) | INTRAVENOUS | Status: DC
  Administered 2021-05-23 – 2021-05-27 (×8): 1250 mg via INTRAVENOUS
  Filled 2021-05-23 (×9): qty 250

## 2021-05-23 MED ORDER — HYDROMORPHONE HCL 1 MG/ML IJ SOLN
1.0000 mg | INTRAMUSCULAR | Status: DC | PRN
  Administered 2021-05-23 – 2021-05-27 (×14): 1 mg via INTRAVENOUS
  Filled 2021-05-23 (×14): qty 1

## 2021-05-23 MED ORDER — HYDROMORPHONE HCL 1 MG/ML IJ SOLN: 1.0000 mg | INTRAMUSCULAR | Status: DC | PRN

## 2021-05-23 MED ORDER — PNEUMOCOCCAL VAC POLYVALENT 25 MCG/0.5ML IJ INJ
0.5000 mL | INJECTION | INTRAMUSCULAR | Status: DC
  Filled 2021-05-23: qty 0.5

## 2021-05-23 MED ORDER — OXYCODONE-ACETAMINOPHEN 5-325 MG PO TABS
1.0000 | ORAL_TABLET | ORAL | Status: DC | PRN
  Administered 2021-05-23: 2 via ORAL
  Administered 2021-05-24: 1 via ORAL
  Administered 2021-05-24 – 2021-05-27 (×12): 2 via ORAL
  Filled 2021-05-23 (×11): qty 2
  Filled 2021-05-23: qty 1
  Filled 2021-05-23 (×2): qty 2

## 2021-05-23 MED ORDER — HYDROMORPHONE HCL 1 MG/ML IJ SOLN
0.5000 mg | INTRAMUSCULAR | Status: DC | PRN
  Administered 2021-05-23: 0.5 mg via INTRAVENOUS
  Filled 2021-05-23: qty 0.5

## 2021-05-23 NOTE — Telephone Encounter (Signed)
Dr. Wyline Copas from [facility] called and stated we sent this patient to the hospital yesterday. Dr. Wyline Copas is requesting a hospital consult. ? ?Please advise ?

## 2021-05-23 NOTE — Consult Note (Signed)
? ?Reason for Consult: Right foot infection ?Referring Physician: Dr. Wyline Copas ? ?[name] is an 61 y.o. male.  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  He is undergone amputation of the right great toe third and fourth toes.  He continues to develop worsening infection and ulceration he was seen by my partner in the office yesterday and admission was recommended.  MRI was completed this morning's of the toe and foot is very painful ? ?Past Medical History:  ?Diagnosis Date  ? Arthritis   ? Diabetes mellitus   ? GERD (gastroesophageal reflux disease)   ? Headache(784.0)   ? History of kidney stones   ? Hypertension   ? Kidney stones   ? Neuropathy   ? ? ?Past Surgical History:  ?Procedure Laterality Date  ? AMPUTATION TOE Right 06/21/2019  ? Procedure: AMPUTATION TOE;  Surgeon: Aviva Signs, MD;  Location: [facility];  Service: General;  Laterality: Right;  ? AMPUTATION TOE Right 08/29/2020  ? Procedure: RIGHT THIRD AMPUTATION TOE;  Surgeon: Aviva Signs, MD;  Location: [facility];  Service: General;  Laterality: Right;  third toe  ? AMPUTATION TOE Right 02/20/2021  ? Procedure: AMPUTATION  TRANSMATARSAL  RIGHT; GREAT TOE;  Surgeon: Aviva Signs, MD;  Location: [facility];  Service: General;  Laterality: Right;  ? BACK SURGERY  2022  ? CARDIAC CATHETERIZATION  01/21/2007  ? CHOLECYSTECTOMY N/A 10/24/2013  ? Procedure: LAPAROSCOPIC CHOLECYSTECTOMY;  Surgeon: Jamesetta So, MD;  Location: [facility];  Service: General;  Laterality: N/A;  ? COLONOSCOPY N/A 03/31/2013  ? Procedure: COLONOSCOPY;  Surgeon: Rogene Houston, MD;  Location[facility];  Service: Endoscopy;  Laterality: N/A;  730  ? IRRIGATION AND DEBRIDEMENT FOOT Right 04/19/2021  ? Procedure: IRRIGATION AND DEBRIDEMENT RIGHT SECOND TOE;  Surgeon: Aviva Signs, MD;  Location: [facility];  Service: General;  Laterality: Right;  ? SECONDARY CLOSURE OF WOUND Right 04/19/2021  ? Procedure: IRRIGATION AND DEBRIDEMENT  SECONDARY CLOSURE OF RIGHT GREAT TOE WOUND;  Surgeon: Aviva Signs, MD;  Location: [facility];  Service: General;  Laterality: Right;  ? ? ?Family History  ?Problem Relation Age of Onset  ? Diabetes Mother   ? Heart failure Mother   ? Heart disease Mother   ? Heart failure Father   ? ? ?Social History:  reports that he quit smoking about 39 years ago. His smoking use included cigarettes. He has a 7.00 pack-year smoking history. He has never used smokeless tobacco. He reports that he does not drink alcohol and does not use drugs. ? ?Allergies:  ?Allergies  ?Allergen Reactions  ? Latex Rash and Other (See Comments)  ?  Redness   ? ? ?Medications: I have reviewed the patient's current medications. ? ?Results for orders placed or performed during the hospital encounter of 05/22/21 (from the past 48 hour(s))  ?Comprehensive metabolic panel     Status: Abnormal  ? Collection Time: 05/22/21  3:29 PM  ?Result Value Ref Range  ? Sodium 139 135 - 145 mmol/L  ? Potassium 3.9 3.5 - 5.1 mmol/L  ? Chloride 106 98 - 111 mmol/L  ? CO2 25 22 - 32 mmol/L  ? Glucose, Bld 106 (H) 70 - 99 mg/dL  ?  Comment: Glucose reference range applies only to samples taken after fasting for at least 8 hours.  ? BUN 12 6 - 20 mg/dL  ? Creatinine, Ser 1.21 0.61 - 1.24 mg/dL  ? Calcium 9.1  8.9 - 10.3 mg/dL  ? Total Protein 6.9 6.5 - 8.1 g/dL  ? Albumin 3.0 (L) 3.5 - 5.0 g/dL  ? AST 25 15 - 41 U/L  ? ALT 27 0 - 44 U/L  ? Alkaline Phosphatase 82 38 - 126 U/L  ? Total Bilirubin 0.3 0.3 - 1.2 mg/dL  ? GFR, Estimated >60 >60 mL/min  ?  Comment: (NOTE) ?Calculated using the CKD-EPI Creatinine Equation (2021) ?  ? Anion gap 8 5 - 15  ?  Comment: Performed at [facility], [place] [address], [place], [place] [address]  ?CBC with Differential     Status: Abnormal  ? Collection Time: 05/22/21  3:29 PM  ?Result Value Ref Range  ? WBC 10.7 (H) 4.0 - 10.5 K/uL  ? RBC 4.02 (L) 4.22 - 5.81 MIL/uL  ? Hemoglobin 10.9 (L) 13.0 - 17.0 g/dL  ? HCT 33.6 (L) 39.0 - 52.0 %   ? MCV 83.6 80.0 - 100.0 fL  ? MCH 27.1 26.0 - 34.0 pg  ? MCHC 32.4 30.0 - 36.0 g/dL  ? RDW 15.4 11.5 - 15.5 %  ? Platelets 417 (H) 150 - 400 K/uL  ? nRBC 0.0 0.0 - 0.2 %  ? Neutrophils Relative % 62 %  ? Neutro Abs 6.8 1.7 - 7.7 K/uL  ? Lymphocytes Relative 26 %  ? Lymphs Abs 2.7 0.7 - 4.0 K/uL  ? Monocytes Relative 7 %  ? Monocytes Absolute 0.7 0.1 - 1.0 K/uL  ? Eosinophils Relative 3 %  ? Eosinophils Absolute 0.3 0.0 - 0.5 K/uL  ? Basophils Relative 1 %  ? Basophils Absolute 0.1 0.0 - 0.1 K/uL  ? Immature Granulocytes 1 %  ? Abs Immature Granulocytes 0.08 (H) 0.00 - 0.07 K/uL  ?  Comment: Performed at [facility], [place] [address] [place], [place] [address]  ?Lactic acid     Status: None  ? Collection Time: 05/22/21  6:50 PM  ?Result Value Ref Range  ? Lactic Acid, Venous 1.2 0.5 - 1.9 mmol/L  ?  Comment: Performed at [facility], [place] [address] [place], [place] [address]  ?Blood Cultures x 2 sites     Status: None (Preliminary result)  ? Collection Time: 05/22/21  6:50 PM  ? Specimen: BLOOD  ?Result Value Ref Range  ? Specimen Description BLOOD SITE NOT SPECIFIED   ? Special Requests    ?  BOTTLES DRAWN AEROBIC AND ANAEROBIC Blood Culture adequate volume  ? Culture    ?  NO GROWTH < 12 HOURS ?Performed at [facility], [place] [address], [place], [place] [address] ?  ? Report Status PENDING   ?Blood Cultures x 2 sites     Status: None (Preliminary result)  ? Collection Time: 05/22/21  7:00 PM  ? Specimen: BLOOD  ?Result Value Ref Range  ? Specimen Description BLOOD SITE NOT SPECIFIED   ? Special Requests    ?  BOTTLES DRAWN AEROBIC AND ANAEROBIC Blood Culture adequate volume  ? Culture    ?  NO GROWTH < 12 HOURS ?Performed at [facility], [place] [address], [place], [place] [address] ?  ? Report Status PENDING   ?Hemoglobin A1c     Status: Abnormal  ? Collection Time: 05/22/21 10:48 PM  ?Result Value Ref Range  ? Hgb A1c MFr Bld 6.7 (H) 4.8 - 5.6 %  ?  Comment: (NOTE) ?Pre diabetes:  5.7%-6.4% ? ?Diabetes:              >6.4% ? ?Glycemic control for   <7.0% ?adults with diabetes ?  ? Mean Plasma Glucose 145.59 mg/dL  ?  Comment: Performed at [facility], [place] [address], [place], [place] [address]  ?CBG monitoring, ED     Status: Abnormal  ? Collection Time: 05/23/21  1:15 AM  ?Result Value Ref Range  ? Glucose-Capillary 180 (H) 70 - 99 mg/dL  ?  Comment: Glucose reference range applies only to samples taken after fasting for at least 8 hours.  ?CBC     Status: Abnormal  ? Collection Time: 05/23/21  2:49 AM  ?Result Value Ref Range  ? WBC 8.4 4.0 - 10.5 K/uL  ? RBC 3.58 (L) 4.22 - 5.81 MIL/uL  ? Hemoglobin 9.8 (L) 13.0 - 17.0 g/dL  ? HCT 29.9 (L) 39.0 - 52.0 %  ? MCV 83.5 80.0 - 100.0 fL  ? MCH 27.4 26.0 - 34.0 pg  ? MCHC 32.8 30.0 - 36.0 g/dL  ? RDW 15.6 (H) 11.5 - 15.5 %  ? Platelets 344 150 - 400 K/uL  ? nRBC 0.0 0.0 - 0.2 %  ?  Comment: Performed at [facility], [place] [address] [place], [place] [address]  ?Basic metabolic panel     Status: Abnormal  ? Collection Time: 05/23/21  2:49 AM  ?Result Value Ref Range  ? Sodium 139 135 - 145 mmol/L  ? Potassium 3.7 3.5 - 5.1 mmol/L  ? Chloride 110 98 - 111 mmol/L  ? CO2 23 22 - 32 mmol/L  ? Glucose, Bld 199 (H) 70 - 99 mg/dL  ?  Comment: Glucose reference range applies only to samples taken after fasting for at least 8 hours.  ? BUN 14 6 - 20 mg/dL  ? Creatinine, Ser 0.89 0.61 - 1.24 mg/dL  ? Calcium 8.0 (L) 8.9 - 10.3 mg/dL  ? GFR, Estimated >60 >60 mL/min  ?  Comment: (NOTE) ?Calculated using the CKD-EPI Creatinine Equation (2021) ?  ? Anion gap 6 5 - 15  ?  Comment: Performed at [facility], [place] [address] [place],  [address]  ?CBG monitoring, ED     Status: Abnormal  ? Collection Time: 05/23/21  8:08 AM  ?Result Value Ref Range  ? Glucose-Capillary 101 (H) 70 - 99 mg/dL  ?  Comment: Glucose reference range applies only to samples taken after fasting for at least 8 hours.  ?CBG monitoring, ED     Status: Abnormal  ?  Collection Time: 05/23/21 11:55 AM  ?Result Value Ref Range  ? Glucose-Capillary 105 (H) 70 - 99 mg/dL  ?  Comment: Glucose reference range applies only to samples taken after fasting for at least 8 hou

## 2021-05-23 NOTE — Progress Notes (Signed)
?Progress Note ? ? ?Patient: [name] [id number] DOB: Aug 05, 1960 DOA: 05/22/2021     1 ?DOS: the patient was seen and examined on 05/23/2021 ?  ?Brief hospital course: ?61 y.o. male with medical history significant of History of hypertension, insulin-dependent type 2 diabetes, osteomyelitis with history of multiple toe amputations presented at the recommendation of podiatry for right foot cellulitis. ?  ?Patient has been treated and managed for the past several months in [place] with Dr. Aviva Signs, general surgery.  Has history of amputation of the first, third and fourth right toes with recent revision of the right great toe on 04/19/2021.  Reports that since February he has noticed a discoloration of his second toe and he has been treated with multiple rounds of antibiotics by general surgery.  Currently taking Bactrim. ?He became frustrated with lack of work-up with surgery and presented to [facility] today and saw Dr. Daylene Katayama with podiatry for second opinion of right foot cellulitis.  ?  ?He was found to have an erythematous right forefoot with gangrenous distal half of the right second toe and x-ray showing fracture at the base of the proximal phalanx of the second toe and advised to present to the ED for IV antibiotics and further imaging.  ? ?Assessment and Plan: ?* Osteomyelitis ([place]) ?History of amputation of the 1st, 3rd and 4th toes of right foot by general surgery Dr. Arnoldo Morale. Received second opinion with podiatry who referred pt to ED ?-Reviewed MRI R foot, with evidence of osteomyelitis ?-Podiatry consulted, will f/u recs. Updated ABI pending ?-Last ABI on 05/2019 with normal right ABI at the level of the dorsalis pedis artery but limited evaluation at the posterior tibial artery due to noncompressibility. ?-Continued on IV Rocephin, vancomycin and Flagyl ?-cont analgesia as needed ? ?AKI (acute kidney injury) ([place]) ?Creatinine elevated to 1.21 from prior of 0.69. ?Hold home  Lasix and losartan for now.  Avoid other nephrotoxic agent ?Continued on IV fluids overnight with Cr down to 0.89 ? ?DM type 2 (diabetes mellitus, type 2) ([place]) ?Insulin dependent with peripheral neuropathy ?-home regimen: 45U BID of Basaglar, metformin, Januvia ?-On moderate SSI ACHS for now.    ?-continue gabapentin ? ?Anxiety ?Continue Wellbutrin and Xanax ? ?Gangrene of toe of right foot ([place]) ?-Have consulted podiatry ? ?HTN (hypertension) ?Continue amlodipine, diltiazem, hold losartan and Lasix due to AKI ? ? ? ? ?  ? ?Subjective: Complaining of R foot pain this AM ? ?Physical Exam: ?Vitals:  ? 05/23/21 0500 05/23/21 0619 05/23/21 0630 05/23/21 0700  ?BP: 131/71 (!) 153/90 137/87 (!) 161/85  ?Pulse: 80 90 99 95  ?Resp: '13 12 20 18  '$ ?Temp:      ?TempSrc:      ?SpO2: 97% 100% 93% 100%  ?Weight:      ?Height:      ? ?General exam: Awake, laying in bed, in nad ?Respiratory system: Normal respiratory effort, no wheezing ?Cardiovascular system: regular rate, s1, s2 ?Gastrointestinal system: Soft, nondistended, positive BS ?Central nervous system: CN2-12 grossly intact, strength intact ?Extremities: No rashes, R foot with dressings in place ?Skin: Normal skin turgor, no notable skin lesions seen ?Psychiatry: Mood normal // no visual hallucinations  ? ?Data Reviewed: ? ?MRI personally reviewed. Findings suggestive of osteomyelitis in first metatarsal stump and second metatarsal, second proximal phalanx, and second middle phalanx, and third metatarsal with 1.5x2.6cm abscess.  ? ?Family Communication: Pt in room, family not at bedside ? ?Disposition: ?Status is: Inpatient ?Remains inpatient  appropriate because: severity of illness ? Planned Discharge Destination: Home ? ? ? ? ?Author: ?Marylu Lund, MD ?05/23/2021 9:06 AM ? ?For on call review [email or web address]  ?

## 2021-05-23 NOTE — Telephone Encounter (Signed)
Will see patient today

## 2021-05-23 NOTE — Hospital Course (Addendum)
61 year old M with PMH of IDDM-2, HTN, osteomyelitis s/p multiple right toe amputations directed to ED by podiatry due to right forefoot infection and osteomyelitis after he failed conservative care with oral antibiotics outpatient for months.  Started on vancomycin, ceftriaxone and Flagyl.  MRI concerning for osteomyelitis in the first metatarsal stump and second and third metatarsal bones as well as second second phalanxes with surrounding soft tissue edema.  He underwent right TMT amputation with wound VAC placement by Dr. Sherryle Lis on 5/5.  Blood cultures NGTD.  Surgical culture with few E. coli and few Enterococcus faecalis. Susceptibilities pending.  Returned to the OR and had partial resection of metatarsal x4 and washout and secondary wound closure of open surgical wound on 5/7.   Remains on BSA with Vanco, CTX and Flagyl. ? ?Therapy recommended home health PT/OT. ? ?

## 2021-05-23 NOTE — Assessment & Plan Note (Deleted)
-  Followed by Podiatry, now s/p surgery 5/5 ?

## 2021-05-23 NOTE — Progress Notes (Signed)
ABI has been completed.  ? ?Preliminary results in CV Proc.  ? ?[name] ?05/23/2021 9:24 AM    ?

## 2021-05-24 ENCOUNTER — Encounter (HOSPITAL_COMMUNITY)
Admission: EM | Disposition: A | Discharge status: Home Health | Payer: Self-pay | Source: Home / Self Care | Attending: Student

## 2021-05-24 ENCOUNTER — Encounter (HOSPITAL_COMMUNITY): Payer: Self-pay | Admitting: Family Medicine

## 2021-05-24 ENCOUNTER — Inpatient Hospital Stay (HOSPITAL_COMMUNITY): Payer: Medicare Other | Admitting: Certified Registered Nurse Anesthetist

## 2021-05-24 DIAGNOSIS — N179 Acute kidney failure, unspecified: Secondary | ICD-10-CM | POA: Diagnosis not present

## 2021-05-24 DIAGNOSIS — I96 Gangrene, not elsewhere classified: Secondary | ICD-10-CM

## 2021-05-24 DIAGNOSIS — Z794 Long term (current) use of insulin: Secondary | ICD-10-CM

## 2021-05-24 DIAGNOSIS — E1101 Type 2 diabetes mellitus with hyperosmolarity with coma: Secondary | ICD-10-CM

## 2021-05-24 DIAGNOSIS — M86171 Other acute osteomyelitis, right ankle and foot: Secondary | ICD-10-CM | POA: Diagnosis not present

## 2021-05-24 DIAGNOSIS — S91301A Unspecified open wound, right foot, initial encounter: Secondary | ICD-10-CM | POA: Diagnosis not present

## 2021-05-24 DIAGNOSIS — E11628 Type 2 diabetes mellitus with other skin complications: Secondary | ICD-10-CM

## 2021-05-24 DIAGNOSIS — M869 Osteomyelitis, unspecified: Secondary | ICD-10-CM | POA: Diagnosis not present

## 2021-05-24 DIAGNOSIS — E1169 Type 2 diabetes mellitus with other specified complication: Secondary | ICD-10-CM

## 2021-05-24 DIAGNOSIS — L089 Local infection of the skin and subcutaneous tissue, unspecified: Secondary | ICD-10-CM

## 2021-05-24 HISTORY — PX: TRANSMETATARSAL AMPUTATION: SHX6197

## 2021-05-24 LAB — COMPREHENSIVE METABOLIC PANEL
ALT: 26 U/L (ref 0–44)
AST: 16 U/L (ref 15–41)
Albumin: 2.5 g/dL — ABNORMAL LOW (ref 3.5–5.0)
Alkaline Phosphatase: 92 U/L (ref 38–126)
Anion gap: 6 (ref 5–15)
BUN: 12 mg/dL (ref 6–20)
CO2: 25 mmol/L (ref 22–32)
Calcium: 8.6 mg/dL — ABNORMAL LOW (ref 8.9–10.3)
Chloride: 109 mmol/L (ref 98–111)
Creatinine, Ser: 0.82 mg/dL (ref 0.61–1.24)
GFR, Estimated: >60 mL/min (ref >60)
Glucose, Bld: 130 mg/dL — ABNORMAL HIGH (ref 70–99)
Potassium: 4 mmol/L (ref 3.5–5.1)
Sodium: 140 mmol/L (ref 135–145)
Total Bilirubin: 0.3 mg/dL (ref 0.3–1.2)
Total Protein: 5.9 g/dL — ABNORMAL LOW (ref 6.5–8.1)

## 2021-05-24 LAB — SURGICAL PCR SCREEN
MRSA, PCR: NEGATIVE
Staphylococcus aureus: NEGATIVE

## 2021-05-24 LAB — CBC
HCT: 30.2 % — ABNORMAL LOW (ref 39.0–52.0)
Hemoglobin: 9.8 g/dL — ABNORMAL LOW (ref 13.0–17.0)
MCH: 27 pg (ref 26.0–34.0)
MCHC: 32.5 g/dL (ref 30.0–36.0)
MCV: 83.2 fL (ref 80.0–100.0)
Platelets: 350 10*3/uL (ref 150–400)
RBC: 3.63 MIL/uL — ABNORMAL LOW (ref 4.22–5.81)
RDW: 15.5 % (ref 11.5–15.5)
WBC: 9 10*3/uL (ref 4.0–10.5)
nRBC: 0 % (ref 0.0–0.2)

## 2021-05-24 LAB — GLUCOSE, CAPILLARY
Glucose-Capillary: 231 mg/dL — ABNORMAL HIGH (ref 70–99)
Glucose-Capillary: 121 mg/dL — ABNORMAL HIGH (ref 70–99)
Glucose-Capillary: 115 mg/dL — ABNORMAL HIGH (ref 70–99)
Glucose-Capillary: 138 mg/dL — ABNORMAL HIGH (ref 70–99)
Glucose-Capillary: 143 mg/dL — ABNORMAL HIGH (ref 70–99)
Glucose-Capillary: 146 mg/dL — ABNORMAL HIGH (ref 70–99)
Glucose-Capillary: 132 mg/dL — ABNORMAL HIGH (ref 70–99)
Glucose-Capillary: 211 mg/dL — ABNORMAL HIGH (ref 70–99)

## 2021-05-24 SURGERY — AMPUTATION, FOOT, TRANSMETATARSAL
Anesthesia: Monitor Anesthesia Care | Site: Foot | Laterality: Right

## 2021-05-24 MED ORDER — 0.9 % SODIUM CHLORIDE (POUR BTL) OPTIME
TOPICAL | Status: DC | PRN
  Administered 2021-05-24: 1000 mL

## 2021-05-24 MED ORDER — LACTATED RINGERS IV SOLN: INTRAVENOUS | Status: DC

## 2021-05-24 MED ORDER — ACETAMINOPHEN 500 MG PO TABS: 1000.0000 mg | ORAL_TABLET | Freq: Once | ORAL | Status: DC | PRN

## 2021-05-24 MED ORDER — MIDAZOLAM HCL 2 MG/2ML IJ SOLN
INTRAMUSCULAR | Status: AC
  Administered 2021-05-24: 1 mg via INTRAVENOUS
  Filled 2021-05-24: qty 2

## 2021-05-24 MED ORDER — BUPIVACAINE-EPINEPHRINE (PF) 0.5% -1:200000 IJ SOLN
INTRAMUSCULAR | Status: DC | PRN
  Administered 2021-05-24: 25 mL via PERINEURAL

## 2021-05-24 MED ORDER — ADULT MULTIVITAMIN W/MINERALS CH
1.0000 | ORAL_TABLET | Freq: Every day | ORAL | Status: DC
Start: 2021-05-24 — End: 2021-05-27
  Administered 2021-05-25 – 2021-05-27 (×2): 1 via ORAL
  Filled 2021-05-24 (×2): qty 1

## 2021-05-24 MED ORDER — ACETAMINOPHEN 160 MG/5ML PO SOLN: 1000.0000 mg | Freq: Once | ORAL | Status: DC | PRN

## 2021-05-24 MED ORDER — LIDOCAINE-EPINEPHRINE 2 %-1:100000 IJ SOLN
INTRAMUSCULAR | Status: DC | PRN
  Administered 2021-05-24: 5 mL via PERINEURAL

## 2021-05-24 MED ORDER — HEMOSTATIC AGENTS (NO CHARGE) OPTIME
TOPICAL | Status: DC | PRN
  Administered 2021-05-24: 1 via TOPICAL

## 2021-05-24 MED ORDER — CHLORHEXIDINE GLUCONATE 0.12 % MT SOLN
OROMUCOSAL | Status: AC
  Filled 2021-05-24: qty 15

## 2021-05-24 MED ORDER — OXYCODONE HCL 5 MG PO TABS: 5.0000 mg | ORAL_TABLET | Freq: Once | ORAL | Status: DC | PRN

## 2021-05-24 MED ORDER — SODIUM CHLORIDE 0.9 % IR SOLN
Status: DC | PRN
  Administered 2021-05-24: 3000 mL

## 2021-05-24 MED ORDER — MIDAZOLAM HCL 2 MG/2ML IJ SOLN: 1.0000 mg | Freq: Once | INTRAMUSCULAR | Status: AC

## 2021-05-24 MED ORDER — FENTANYL CITRATE (PF) 100 MCG/2ML IJ SOLN: 25.0000 ug | INTRAMUSCULAR | Status: DC | PRN

## 2021-05-24 MED ORDER — PROPOFOL 500 MG/50ML IV EMUL
INTRAVENOUS | Status: DC | PRN
  Administered 2021-05-24: 80 ug/kg/min via INTRAVENOUS

## 2021-05-24 MED ORDER — ASCORBIC ACID 500 MG PO TABS
500.0000 mg | ORAL_TABLET | Freq: Every day | ORAL | Status: DC
  Administered 2021-05-25 – 2021-05-27 (×2): 500 mg via ORAL
  Filled 2021-05-24 (×2): qty 1

## 2021-05-24 MED ORDER — ENSURE ENLIVE PO LIQD
237.0000 mL | Freq: Two times a day (BID) | ORAL | Status: DC
  Administered 2021-05-25: 237 mL via ORAL

## 2021-05-24 MED ORDER — ZINC SULFATE 220 (50 ZN) MG PO CAPS
220.0000 mg | ORAL_CAPSULE | Freq: Every day | ORAL | Status: DC
  Administered 2021-05-25 – 2021-05-27 (×2): 220 mg via ORAL
  Filled 2021-05-24 (×2): qty 1

## 2021-05-24 MED ORDER — ORAL CARE MOUTH RINSE: 15.0000 mL | Freq: Once | OROMUCOSAL | Status: AC

## 2021-05-24 MED ORDER — FENTANYL CITRATE (PF) 100 MCG/2ML IJ SOLN: 50.0000 ug | Freq: Once | INTRAMUSCULAR | Status: AC

## 2021-05-24 MED ORDER — INSULIN ASPART 100 UNIT/ML IJ SOLN: 0.0000 [IU] | INTRAMUSCULAR | Status: DC | PRN

## 2021-05-24 MED ORDER — BUPIVACAINE HCL (PF) 0.25 % IJ SOLN
INTRAMUSCULAR | Status: AC
  Filled 2021-05-24: qty 30

## 2021-05-24 MED ORDER — ACETAMINOPHEN 10 MG/ML IV SOLN: 1000.0000 mg | Freq: Once | INTRAVENOUS | Status: DC | PRN

## 2021-05-24 MED ORDER — OXYCODONE HCL 5 MG/5ML PO SOLN: 5.0000 mg | Freq: Once | ORAL | Status: DC | PRN

## 2021-05-24 MED ORDER — FENTANYL CITRATE (PF) 100 MCG/2ML IJ SOLN
INTRAMUSCULAR | Status: AC
  Administered 2021-05-24: 50 ug via INTRAVENOUS
  Filled 2021-05-24: qty 2

## 2021-05-24 MED ORDER — CHLORHEXIDINE GLUCONATE 0.12 % MT SOLN
15.0000 mL | Freq: Once | OROMUCOSAL | Status: AC
  Administered 2021-05-24: 15 mL via OROMUCOSAL

## 2021-05-24 SURGICAL SUPPLY — 38 items
BLADE LONG MED 31X9 (MISCELLANEOUS) IMPLANT
BNDG ELASTIC 4X5.8 VLCR NS LF (GAUZE/BANDAGES/DRESSINGS) ×1 IMPLANT
CNTNR URN SCR LID CUP LEK RST (MISCELLANEOUS) IMPLANT
CONT SPEC 4OZ STRL OR WHT (MISCELLANEOUS) ×2
COVER SURGICAL LIGHT HANDLE (MISCELLANEOUS) ×2 IMPLANT
CUFF TOURN SGL QUICK 24 (TOURNIQUET CUFF)
CUFF TRNQT CYL 24X4X16.5-23 (TOURNIQUET CUFF) IMPLANT
DRAPE SURG 17X23 STRL (DRAPES) ×2 IMPLANT
DRSG VAC ATS MED SENSATRAC (GAUZE/BANDAGES/DRESSINGS) ×1 IMPLANT
GAUZE SPONGE 2X2 8PLY STRL LF (GAUZE/BANDAGES/DRESSINGS) IMPLANT
GAUZE SPONGE 4X4 12PLY STRL (GAUZE/BANDAGES/DRESSINGS) IMPLANT
GAUZE XEROFORM 1X8 LF (GAUZE/BANDAGES/DRESSINGS) IMPLANT
GLOVE BIOGEL M 7.0 STRL (GLOVE) ×2 IMPLANT
GLOVE BIOGEL PI ORTHO PRO 7.5 (GLOVE) ×1
GLOVE PI ORTHO PRO STRL 7.5 (GLOVE) ×1 IMPLANT
GOWN STRL REUS W/ TWL LRG LVL3 (GOWN DISPOSABLE) ×2 IMPLANT
GOWN STRL REUS W/TWL LRG LVL3 (GOWN DISPOSABLE) ×4
KIT BASIN OR (CUSTOM PROCEDURE TRAY) ×2 IMPLANT
KIT TURNOVER KIT B (KITS) ×2 IMPLANT
NDL HYPO 25GX1X1/2 BEV (NEEDLE) IMPLANT
NEEDLE HYPO 25GX1X1/2 BEV (NEEDLE) IMPLANT
NS IRRIG 1000ML POUR BTL (IV SOLUTION) ×2 IMPLANT
PACK ORTHO EXTREMITY (CUSTOM PROCEDURE TRAY) ×2 IMPLANT
PAD ARMBOARD 7.5X6 YLW CONV (MISCELLANEOUS) ×4 IMPLANT
PAD CAST 4YDX4 CTTN HI CHSV (CAST SUPPLIES) IMPLANT
PADDING CAST COTTON 4X4 STRL (CAST SUPPLIES) ×2
SOL PREP POV-IOD 4OZ 10% (MISCELLANEOUS) ×6 IMPLANT
SPECIMEN JAR SMALL (MISCELLANEOUS) ×2 IMPLANT
SPONGE GAUZE 2X2 STER 10/PKG (GAUZE/BANDAGES/DRESSINGS)
SPONGE T-LAP 18X18 ~~LOC~~+RFID (SPONGE) ×1 IMPLANT
SUT ETHILON 3 0 PS 1 (SUTURE) ×2 IMPLANT
SUT SILK 3 0 SH CR/8 (SUTURE) ×1 IMPLANT
SYR CONTROL 10ML LL (SYRINGE) IMPLANT
TOWEL GREEN STERILE (TOWEL DISPOSABLE) ×2 IMPLANT
TOWEL GREEN STERILE FF (TOWEL DISPOSABLE) ×2 IMPLANT
TUBE CONNECTING 12X1/4 (SUCTIONS) IMPLANT
UNDERPAD 30X36 HEAVY ABSORB (UNDERPADS AND DIAPERS) ×2 IMPLANT
WATER STERILE IRR 1000ML POUR (IV SOLUTION) ×2 IMPLANT

## 2021-05-24 NOTE — Anesthesia Postprocedure Evaluation (Signed)
Anesthesia Post Note ? ?Patient: [name] ? ?Procedure(s) Performed: TRANSMETATARSAL AMPUTATION (Right: Foot) ? ?  ? ?Patient location during evaluation: PACU ?Anesthesia Type: Regional ?Level of consciousness: awake ?Pain management: pain level controlled ?Vital Signs Assessment: post-procedure vital signs reviewed and stable ?Respiratory status: spontaneous breathing ?Cardiovascular status: stable ?Postop Assessment: no apparent nausea or vomiting ?Anesthetic complications: no ? ? ?No notable events documented. ? ?Last Vitals:  ?Vitals:  ? 05/24/21 1529 05/24/21 1730  ?BP: (!) 120/54 110/72  ?Pulse: 84 81  ?Resp: 17 13  ?Temp:  36.5 ?C  ?SpO2: 99% 93%  ?  ?Last Pain:  ?Vitals:  ? 05/24/21 1730  ?TempSrc:   ?PainSc: Asleep  ? ? ?  ?  ?  ?  ?  ?  ? ?Kipling Graser ? ? ? ? ?

## 2021-05-24 NOTE — Transfer of Care (Signed)
Immediate Anesthesia Transfer of Care Note ? ?Patient: [name] ? ?Procedure(s) Performed: TRANSMETATARSAL AMPUTATION (Right: Foot) ? ?Patient Location: PACU ? ?Anesthesia Type:MAC ? ?Level of Consciousness: drowsy and patient cooperative ? ?Airway & Oxygen Therapy: Patient Spontanous Breathing and Patient connected to face mask oxygen ? ?Post-op Assessment: Report given to RN and Post -op Vital signs reviewed and stable ? ?Post vital signs: Reviewed and stable ? ?Last Vitals:  ?Vitals Value Taken Time  ?BP 110/72 05/24/21 1730  ?Temp 36.5 ?C 05/24/21 1730  ?Pulse 80 05/24/21 1735  ?Resp 11 05/24/21 1735  ?SpO2 92 % 05/24/21 1735  ?Vitals shown include unvalidated device data. ? ?Last Pain:  ?Vitals:  ? 05/24/21 1730  ?TempSrc:   ?PainSc: Asleep  ?   ? ?Patients Stated Pain Goal: 0 (05/23/21 1527) ? ?Complications: No notable events documented. ?

## 2021-05-24 NOTE — Discharge Instructions (Signed)
[facility] Hospital Stay ?Proper nutrition can help your body recover from illness and injury.   ?Foods and beverages high in protein, vitamins, and minerals help rebuild muscle loss, promote healing, & reduce fall risk.  ? ?In addition to eating healthy foods, a nutrition shake is an easy, delicious way to get the nutrition you need during and after your hospital stay ? ?It is recommended that you continue to drink 2 bottles per day of: Ensure/Boost Plus for at least 1 month (30 days) after your hospital stay  ? ?Tips for adding a nutrition shake into your routine: ?As allowed, drink one with vitamins or medications instead of water or juice ?Enjoy one as a tasty mid-morning or afternoon snack ?Drink cold or make a milkshake out of it ?Drink one instead of milk with cereal or snacks ?Use as a coffee creamer ?  ?Available at the following grocery stores and pharmacies:           ?* [place]  ?* [facility]          * [place]  ?* [facility]      * Target  * [employer]   ?* [facility]  * [employer]   ?[facility] [phone]  ?          ?For COUPONS visit: www.ensure.com/join or [email or web address]-[email or web address]  ? ?Suggested Substitutions ?Ensure Plus = Boost Plus = Carnation Breakfast Essentials = Boost Compact ?Ensure Active Clear = Boost Breeze ?Glucerna Shake = Boost Glucose Control = Carnation Breakfast Essentials SUGAR FREE ? ?  ? ?

## 2021-05-24 NOTE — Progress Notes (Signed)
Dressing on the right foot changed due to it falling off.  Noted with some bloody drainage,  explained to the pt why he is NPO because he keeping asking for water to drink.  He is allowed sips with meds.  Pt verbalizes understanding of the rationale. ?

## 2021-05-24 NOTE — Progress Notes (Signed)
?Progress Note ? ? ?Patient: [name] [id number]:[id number] DOB: 1960/06/23 DOA: 05/22/2021     2 ?DOS: the patient was seen and examined on 05/24/2021 ?  ?Brief hospital course: ?61 y.o. male with medical history significant of History of hypertension, insulin-dependent type 2 diabetes, osteomyelitis with history of multiple toe amputations presented at the recommendation of podiatry for right foot cellulitis. ?  ?Patient has been treated and managed for the past several months in [place] with Dr. Aviva Signs, general surgery.  Has history of amputation of the first, third and fourth right toes with recent revision of the right great toe on 04/19/2021.  Reports that since February he has noticed a discoloration of his second toe and he has been treated with multiple rounds of antibiotics by general surgery.  Currently taking Bactrim. ?He became frustrated with lack of work-up with surgery and presented to [facility] today and saw Dr. Daylene Katayama with podiatry for second opinion of right foot cellulitis.  ?  ?He was found to have an erythematous right forefoot with gangrenous distal half of the right second toe and x-ray showing fracture at the base of the proximal phalanx of the second toe and advised to present to the ED for IV antibiotics and further imaging.  ? ?Assessment and Plan: ?* Osteomyelitis ([place]) ?History of amputation of the 1st, 3rd and 4th toes of right foot by general surgery Dr. Arnoldo Morale. Received second opinion with podiatry who referred pt to ED ?-Reviewed MRI R foot, with evidence of osteomyelitis ?-Podiatry consulted. Plan for surgery this afternoon ?-Continued on IV Rocephin, vancomycin and Flagyl ?-cont analgesia as needed ? ?AKI (acute kidney injury) ([place]) ?Creatinine elevated to 1.21 from prior of 0.69. ?Hold home Lasix and losartan for now.  Avoid other nephrotoxic agent ?Cr normalized with IVF ? ?DM type 2 (diabetes mellitus, type 2) ([place]) ?Insulin dependent with peripheral  neuropathy ?-home regimen: 45U BID of Basaglar, metformin, Januvia ?-On moderate SSI ACHS for now.    ?-continue gabapentin ?-glycemic trends stable ? ?Anxiety ?Continue Wellbutrin and Xanax ? ?Gangrene of toe of right foot ([place]) ?-Have consulted podiatry, plan for surgery today ? ?HTN (hypertension) ?Continue amlodipine, diltiazem, hold losartan and Lasix due to AKI ? ? ? ? ?  ? ?Subjective: Complaining of continued RLE pain. Eager to have surgery today ? ?Physical Exam: ?Vitals:  ? 05/23/21 2029 05/24/21 0013 05/24/21 0424 05/24/21 0813  ?BP: 120/84 132/81 128/78 130/80  ?Pulse: 95 90 92 84  ?Resp: '17 14 14 14  '$ ?Temp: 98.6 ?F (37 ?C) 98.1 ?F (36.7 ?C) 97.8 ?F (36.6 ?C) (!) 97.5 ?F (36.4 ?C)  ?TempSrc: Oral Oral Oral Oral  ?SpO2: 98% 97% 97% 99%  ?Weight:      ?Height:      ? ?General exam: Conversant, in no acute distress ?Respiratory system: normal chest rise, clear, no audible wheezing ?Cardiovascular system: regular rhythm, s1-s2 ?Gastrointestinal system: Nondistended, nontender, pos BS ?Central nervous system: No seizures, no tremors ?Extremities: No cyanosis, no joint deformities, RLE with dressings in place ?Skin: No rashes, no pallor ?Psychiatry: Affect normal // no auditory hallucinations  ? ?Data Reviewed: ? ?MRI personally reviewed. Findings suggestive of osteomyelitis in first metatarsal stump and second metatarsal, second proximal phalanx, and second middle phalanx, and third metatarsal with 1.5x2.6cm abscess.  ? ?Family Communication: Pt in room, family not at bedside ? ?Disposition: ?Status is: Inpatient ?Remains inpatient appropriate because: severity of illness ? Planned Discharge Destination: Home ? ? ? ? ?Author: ?Annie Main  Wyline Copas, MD ?05/24/2021 11:03 AM ? ?For on call review [email or web address]  ?

## 2021-05-24 NOTE — Anesthesia Procedure Notes (Signed)
Anesthesia Regional Block: Popliteal block  ? ?Pre-Anesthetic Checklist: , timeout performed,  Correct Patient, Correct Site, Correct Laterality,  Correct Procedure, Correct Position, site marked,  Risks and benefits discussed,  Surgical consent,  Pre-op evaluation,  At surgeon's request and post-op pain management ? ?Laterality: Right and Lower ? ?Prep: chloraprep     ?  ?Needles:  ?Injection technique: Single-shot ? ?  ? ? ?Needle Length: 9cm  ?Needle Gauge: 22  ? ? ? ?Additional Needles: ?Arrow? StimuQuik? ECHO Echogenic Stimulating PNB Needle ? ?Procedures:,,,, ultrasound used (permanent image in chart),,    ?Narrative:  ?Start time: 05/24/2021 3:02 PM ?End time: 05/24/2021 3:09 PM ?Injection made incrementally with aspirations every 5 mL. ? ?Performed by: Personally  ?Anesthesiologist: Oleta Mouse, MD ? ? ? ? ?

## 2021-05-24 NOTE — Anesthesia Procedure Notes (Signed)
Procedure Name: [place] ?Date/Time: 05/24/2021 4:24 PM ?Performed by: Erick Colace, CRNA ?Pre-anesthesia Checklist: Patient identified, Emergency Drugs available, Suction available and Patient being monitored ?Patient Re-evaluated:Patient Re-evaluated prior to induction ?Oxygen Delivery Method: Simple face mask ?Preoxygenation: Pre-oxygenation with 100% oxygen ?Induction Type: IV induction ?Dental Injury: Teeth and Oropharynx as per pre-operative assessment  ? ? ? ? ?

## 2021-05-24 NOTE — Progress Notes (Signed)
Pt left floor for the OR ?

## 2021-05-24 NOTE — Care Management Important Message (Signed)
Important Message ? ?Patient Details  ?Name: [name] ?MRN: [id number] ?Date of Birth: [birth year] ? ? ?Medicare Important Message Given:  Yes ? ? ? ? ?[name] ?05/24/2021, 4:39 PM ?

## 2021-05-24 NOTE — H&P (Signed)
History and Physical Interval Note: ? ?05/24/2021 ?2:53 PM ? ?[name]  has presented today for surgery, with the diagnosis of diabetic foot infection of right foot.  The various methods of treatment have been discussed with the patient and family. After consideration of risks, benefits and other options for treatment, the patient has consented to   ?Procedure(s): ?TRANSMETATARSAL AMPUTATION (Right) as a surgical intervention.  The patient's history has been reviewed, patient examined, no change in status, stable for surgery.  I have reviewed the patient's chart and labs.  Questions were answered to the patient's satisfaction.   ? ? ?Stephan Minister Adalid Beckmann ? ? ?

## 2021-05-24 NOTE — TOC CM/SW Note (Signed)
?  Transition of Care (TOC) Screening Note ? ? ?Patient Details  ?Name: [name] ?Date of Birth: 12-Oct-1960 ? ? ? ?Transition of Care Department [facility]) has reviewed patient and no TOC needs have been identified at this time. We will continue to monitor patient advancement through interdisciplinary progression rounds. If new patient transition needs arise, please place a TOC consult. ?  ?

## 2021-05-24 NOTE — Anesthesia Preprocedure Evaluation (Addendum)
Anesthesia Evaluation  ?Patient identified by MRN, date of birth, ID band ?Patient awake ? ? ? ?Reviewed: ?Allergy & Precautions, NPO status , Patient's Chart, lab work & pertinent test results ? ?History of Anesthesia Complications ?Negative for: history of anesthetic complications ? ?Airway ?Mallampati: IV ? ?TM Distance: >3 FB ?Neck ROM: Full ? ? ? Dental ? ?(+) Teeth Intact, Partial Upper,  ?  ?Pulmonary ?neg pulmonary ROS, former smoker,  ?  ?breath sounds clear to auscultation ? ? ? ? ? ? Cardiovascular ?hypertension, Pt. on medications ? ?Rhythm:Regular  ? ?  ?Neuro/Psych ? Headaches, neg Seizures PSYCHIATRIC DISORDERS Anxiety   ? GI/Hepatic ?PUD, GERD  ,  ?Endo/Other  ?diabetes, Insulin DependentLab Results ?     Component                Value               Date                 ?     HGBA1C                   6.7 (H)             05/22/2021           ? ? Renal/GU ?Renal diseaseLab Results ?     Component                Value               Date                 ?     CREATININE               0.82                05/24/2021           ?  ? ?  ?Musculoskeletal ? ?(+) Arthritis ,  ? Abdominal ?  ?Peds ? Hematology ? ?(+) Blood dyscrasia, anemia , Lab Results ?     Component                Value               Date                 ?     WBC                      9.0                 05/24/2021           ?     HGB                      9.8 (L)             05/24/2021           ?     HCT                      30.2 (L)            05/24/2021           ?     MCV                      83.2  05/24/2021           ?     PLT                      350                 05/24/2021           ?   ?Anesthesia Other Findings ? ? Reproductive/Obstetrics ? ?  ? ? ? ? ? ? ? ? ? ? ? ? ? ?  ?  ? ? ? ? ? ? ? ?Anesthesia Physical ?Anesthesia Plan ? ?ASA: 3 ? ?Anesthesia Plan: MAC and Regional  ? ?Post-op Pain Management: Regional block*  ? ?Induction: Intravenous ? ?PONV Risk Score and Plan: 1 and  Treatment may vary due to age or medical condition ? ?Airway Management Planned: Nasal Cannula, Natural Airway and Simple Face Mask ? ?Additional Equipment: None ? ?Intra-op Plan:  ? ?Post-operative Plan:  ? ?Informed Consent: I have reviewed the patients History and Physical, chart, labs and discussed the procedure including the risks, benefits and alternatives for the proposed anesthesia with the patient or authorized representative who has indicated his/her understanding and acceptance.  ? ? ? ?Dental advisory given ? ?Plan Discussed with: CRNA ? ?Anesthesia Plan Comments:   ? ? ? ? ? ? ?Anesthesia Quick Evaluation ? ?

## 2021-05-24 NOTE — Op Note (Signed)
Patient Name: [name] ?DOB: [birth year]  ?MRN: [id number] ?  ?Date of Service: 05/22/2021 - 05/24/2021 ? ?Surgeon: Dr. Lanae Crumbly, DPM ?Assistants: None ?Pre-operative Diagnosis:  ?Diabetic foot infection ?Osteomyelitis ? ?Post-operative Diagnosis:  ?Diabetic foot infection ?Osteomyelitis ?Procedures: ? 1) transmetatarsal amputation of right foot ? 2) application of negative pressure wound therapy 10 cm x 3 cm ?Pathology/Specimens: ?ID Type Source Tests Collected by Time Destination  ?1 : Right Forefoot  Tissue PATH Amputaion Arm/Leg SURGICAL PATHOLOGY Criselda Peaches, DPM 05/24/2021 1645   ?A : Right Foot Bone Culture Tissue PATH Bone biopsy AEROBIC/ANAEROBIC CULTURE W GRAM STAIN (SURGICAL/DEEP WOUND) Criselda Peaches, DPM 05/24/2021 1647   ? ?Anesthesia: Regional block ?Hemostasis:  ?Total Tourniquet Time Documented: ?Ankle (Right) - 17 minutes ?Total: Ankle (Right) - 17 minutes ? ?Estimated Blood Loss: 100 cc ?Materials: * No implants in log * ?Medications: No medications given ?Complications: No complications noted ? ?Indications for Procedure:  ?This is a 61 y.o. 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  Due to the condition of the foot I recommend transmetatarsal amputation.  He presents today for this and consented to the surgery.  All questions were addressed prior to surgery.  He understands the risk of further limb loss and understands that no guarantees as to the outcome of surgery can be made. ?  ?Procedure in Detail: ?Patient was identified in pre-operative holding area. Formal consent was signed and the right lower extremity was marked. Patient was brought back to the operating room. Anesthesia was induced. The extremity was prepped and draped in the usual sterile fashion. Timeout was taken to confirm patient name, laterality, and procedure prior to incision.   ? ?Attention was then directed to the right foot which exhibited a necrotic digit with purulent drainage and previous partial ray and toe amputations.  A fishmouth type incision was made through the skin.  Dissection was carried down to the level of bone with cautery.  Significant necrosis around the second and third rays was found plantar and dorsal to this.  This was fully excised and all nonviable tissue was removed.  Once the plantar flap was completed the bones were exposed with an elevator and transmetatarsal amputation was carried out.  The forefoot was sent as a pathologic specimen.  A tissue culture of the second digit proximal phalanx base which was completely myelytic and necrotic was sent for tissue culture.  The wound was irrigated thoroughly with a pulse irrigator with 3 L of saline.  Hemostasis was achieved.  At this point I determined that it would be best to allow the wound to drain and heal and secondary closure at a later date would be best.  A negative pressure wound therapy device was then applied the total square centimeters of the defect was 30 cm?. ? ?The foot was then dressed with Webril and Ace wrap. Patient tolerated the procedure well. ?  ?Disposition: ?Following a period of post-operative monitoring, patient will be transferred to the floor.  This will be the first of 2 surgeries he will return at a later date for delayed secondary closure, possible revision of the amputation site if necessary to allow the skin edges to oppose.  We will also plan for tendo Achilles lengthening at that time as well. ? ?

## 2021-05-24 NOTE — Progress Notes (Signed)
Initial Nutrition Assessment ? ?DOCUMENTATION CODES:  ? ?Not applicable ? ?INTERVENTION:  ?Once diet advances, ?- Ensure Enlive po BID, each supplement provides 350 kcal and 20 grams of protein. ?- MVI with minerals daily ?- Vitamin C '500mg'$  daily ?- Zinc '220mg'$  x14 days ?- Check micronutrient labs: Vitamin C, Zinc, Vitamin A ? ?NUTRITION DIAGNOSIS:  ? ?Increased nutrient needs related to wound healing as evidenced by estimated needs. ? ?GOAL:  ? ?Patient will meet greater than or equal to 90% of their needs ? ?MONITOR:  ? ?PO intake, Supplement acceptance, Diet advancement, Labs, Weight trends ? ?REASON FOR ASSESSMENT:  ? ?Consult ?Wound healing (Multiple foot ulcers, poor wound healing, decent A1c control, albumin low) ? ?ASSESSMENT:  ? ?Pt admitted at recommendation of podiatry for R foot cellulitis found to have second toe osteomyelitis and fracture. PMH significant for HTN, IDDM, osteomyelitis with history of multiple toe amputations. ? ?Per Podiatry, noted plans for R transmetatarsal amputation today; likely will need to leave open to drain for 2 days.  ? ?Pt awaiting surgery this afternoon. He states that within the last 3 years since going on disability for his back, his appetite has since decreased. More significantly within the past 3 months d/t R foot pain, he has had no appetite and has been eating very minimally. His intake may include crackers or occasionally a [place] sandwich with cheese. He does not drink nutrition supplements at home and he takes a MVI. To manage his diabetes, he takes insulin nightly and checks his blood sugar once daily which usually ranges from 100-130.  ? ?Pt is agreeable to nutrition supplements and MVI once diet advances to promote wound healing. Encouraged increased protein intake and continuing nutrition supplements at home if he continues with poor PO intake and appetite. Discussed with pt checking for vitamin deficiencies which could be hindering proper wound healing. He is  agreeable.  ? ?Pt reports his weight ~1 year ago was 300 lbs and endorses weight loss. He states that his current weight is around 200 lbs with a 20 lb weight loss within the last 3 months d/t minimal PO intake. Reviewed weight history. Unable to confirm this weight loss. Within the last year pt's weight appears to remain stable between 86.6-90.7 kg.  ? ?Medications: SSI 0-15 units TID, QHS and 0200, protonix , IV abx ? ?Labs: corrected calcium 8.8, HgbA1c 6.7%, CBG's 105-231 x 24 hours ? ?NUTRITION - FOCUSED PHYSICAL EXAM: ? ?Flowsheet Row Most Recent Value  ?Orbital Region No depletion  ?Upper Arm Region Mild depletion  ?Thoracic and Lumbar Region No depletion  ?Buccal Region No depletion  ?Temple Region No depletion  ?Clavicle Bone Region No depletion  ?Clavicle and Acromion Bone Region Mild depletion  ?Scapular Bone Region No depletion  ?Dorsal Hand No depletion  ?Patellar Region Mild depletion  ?Anterior Thigh Region No depletion  ?Posterior Calf Region No depletion  ?Edema (RD Assessment) None  ?Hair Reviewed  ?Eyes Reviewed  ?Mouth Reviewed  ?Skin Reviewed  ?Nails Reviewed  ? ?  ? ? ?Diet Order:   ?Diet Order   ? ?       ?  Diet NPO time specified Except for: Sips with Meds  Diet effective midnight       ?  ? ?  ?  ? ?  ? ? ?EDUCATION NEEDS:  ? ?Education needs have been addressed ? ?Skin:  Skin Assessment: Skin Integrity Issues: ?Skin Integrity Issues:: Other (Comment) ?Other: R foot cellulitis ? ?Last BM:  5/4 ? ?Height:  ? ?Ht Readings from Last 1 Encounters:  ?05/22/21 '5\' 9"'$  (1.753 m)  ? ? ?Weight:  ? ?Wt Readings from Last 1 Encounters:  ?05/22/21 90.7 kg  ? ?BMI:  Body mass index is 29.53 kg/m?. ? ?Estimated Nutritional Needs:  ? ?Kcal:  2300-2500 ? ?Protein:  115-130g ? ?Fluid:  >/=2.3L ? ?[name], RDN, LDN ?Clinical Nutrition ?

## 2021-05-25 ENCOUNTER — Encounter (HOSPITAL_COMMUNITY): Payer: Self-pay | Admitting: Podiatry

## 2021-05-25 DIAGNOSIS — E1165 Type 2 diabetes mellitus with hyperglycemia: Secondary | ICD-10-CM

## 2021-05-25 DIAGNOSIS — M86171 Other acute osteomyelitis, right ankle and foot: Secondary | ICD-10-CM | POA: Diagnosis not present

## 2021-05-25 DIAGNOSIS — N179 Acute kidney failure, unspecified: Secondary | ICD-10-CM | POA: Diagnosis not present

## 2021-05-25 DIAGNOSIS — F419 Anxiety disorder, unspecified: Secondary | ICD-10-CM | POA: Diagnosis not present

## 2021-05-25 LAB — CBC
HCT: 29.9 % — ABNORMAL LOW (ref 39.0–52.0)
Hemoglobin: 9.6 g/dL — ABNORMAL LOW (ref 13.0–17.0)
MCH: 26.9 pg (ref 26.0–34.0)
MCHC: 32.1 g/dL (ref 30.0–36.0)
MCV: 83.8 fL (ref 80.0–100.0)
Platelets: 370 10*3/uL (ref 150–400)
RBC: 3.57 MIL/uL — ABNORMAL LOW (ref 4.22–5.81)
RDW: 15.3 % (ref 11.5–15.5)
WBC: 8.4 10*3/uL (ref 4.0–10.5)
nRBC: 0 % (ref 0.0–0.2)

## 2021-05-25 LAB — COMPREHENSIVE METABOLIC PANEL
ALT: 23 U/L (ref 0–44)
AST: 18 U/L (ref 15–41)
Albumin: 2.5 g/dL — ABNORMAL LOW (ref 3.5–5.0)
Alkaline Phosphatase: 87 U/L (ref 38–126)
Anion gap: 7 (ref 5–15)
BUN: 8 mg/dL (ref 6–20)
CO2: 24 mmol/L (ref 22–32)
Calcium: 8.6 mg/dL — ABNORMAL LOW (ref 8.9–10.3)
Chloride: 109 mmol/L (ref 98–111)
Creatinine, Ser: 0.78 mg/dL (ref 0.61–1.24)
GFR, Estimated: >60 mL/min (ref >60)
Glucose, Bld: 147 mg/dL — ABNORMAL HIGH (ref 70–99)
Potassium: 4.2 mmol/L (ref 3.5–5.1)
Sodium: 140 mmol/L (ref 135–145)
Total Bilirubin: 0.3 mg/dL (ref 0.3–1.2)
Total Protein: 5.8 g/dL — ABNORMAL LOW (ref 6.5–8.1)

## 2021-05-25 LAB — GLUCOSE, CAPILLARY
Glucose-Capillary: 212 mg/dL — ABNORMAL HIGH (ref 70–99)
Glucose-Capillary: 192 mg/dL — ABNORMAL HIGH (ref 70–99)
Glucose-Capillary: 173 mg/dL — ABNORMAL HIGH (ref 70–99)
Glucose-Capillary: 279 mg/dL — ABNORMAL HIGH (ref 70–99)
Glucose-Capillary: 230 mg/dL — ABNORMAL HIGH (ref 70–99)

## 2021-05-25 MED ORDER — ATORVASTATIN CALCIUM 80 MG PO TABS
80.0000 mg | ORAL_TABLET | Freq: Every day | ORAL | Status: DC
  Administered 2021-05-27: 80 mg via ORAL
  Filled 2021-05-25: qty 1

## 2021-05-25 MED ORDER — ASPIRIN EC 81 MG PO TBEC
81.0000 mg | DELAYED_RELEASE_TABLET | Freq: Every day | ORAL | Status: DC
  Administered 2021-05-27: 81 mg via ORAL
  Filled 2021-05-25 (×2): qty 1

## 2021-05-25 MED ORDER — SENNOSIDES-DOCUSATE SODIUM 8.6-50 MG PO TABS
1.0000 | ORAL_TABLET | Freq: Two times a day (BID) | ORAL | Status: DC | PRN
Start: 2021-05-25 — End: 2021-05-27
  Administered 2021-05-27: 1 via ORAL
  Filled 2021-05-25: qty 1

## 2021-05-25 MED ORDER — POLYETHYLENE GLYCOL 3350 17 G PO PACK
17.0000 g | PACK | Freq: Two times a day (BID) | ORAL | Status: DC | PRN
  Administered 2021-05-25 – 2021-05-27 (×2): 17 g via ORAL
  Filled 2021-05-25 (×2): qty 1

## 2021-05-25 NOTE — Evaluation (Signed)
Physical Therapy Evaluation ?Patient Details ?Name: [name] ?MRN: [id number] ?DOB: 1960/08/27 ?Today's Date: 05/25/2021 ? ?History of Present Illness ? Pt is a 61 y.o. male admitted 5/3 with osteomyelitis R foot. He underwent R transmet amputation 5/5. PMH: IDDM, HTN, h/o PLIF (2021), h/o R toe amputations ?  ?Clinical Impression ? Pt admitted with above diagnosis. PTA pt lived alone, mod I mobility with SPC. Pt's daughter assists with iADLs and transportation. Pt currently with functional limitations due to the deficits listed below (see PT Problem List). On eval, pt required min assist sit to stand with RW, min assist amb 2' with RW and min guard assist squat pivot transfer. Pt demonstrates good ability to maintain NWB RLE. Pt will benefit from skilled PT to increase their independence and safety with mobility to allow discharge to the venue listed below.   ?   ?   ? ?Recommendations for follow up therapy are one component of a multi-disciplinary discharge planning process, led by the attending physician.  Recommendations may be updated based on patient status, additional functional criteria and insurance authorization. ? ?Follow Up Recommendations Home health PT ? ?  ?Assistance Recommended at Discharge PRN  ?Patient can return home with the following ? Assistance with cooking/housework;Assist for transportation ? ?  ?Equipment Recommendations Other (comment) (knee scooter)  ?Recommendations for Other Services ?    ?  ?Functional Status Assessment Patient has had a recent decline in their functional status and demonstrates the ability to make significant improvements in function in a reasonable and predictable amount of time.  ? ?  ?Precautions / Restrictions Precautions ?Precautions: Fall;Other (comment) ?Precaution Comments: wound vac R foot ?Restrictions ?RLE Weight Bearing: Non weight bearing  ? ?  ? ?Mobility ? Bed Mobility ?Overal bed mobility: Modified Independent ?  ?  ?  ?  ?  ?  ?General bed  mobility comments: HOB elevated, +rail ?  ? ?Transfers ?Overall transfer level: Needs assistance ?Equipment used: Rolling walker (2 wheels), None ?Transfers: Sit to/from Stand, Bed to chair/wheelchair/BSC ?Sit to Stand: Min assist ?  ?  ?Squat pivot transfers: Min guard ?  ?  ?General transfer comment: Min assist to power up to stand with RW and stabilize balance. Squat pivot transfer bed to recliner toward R min guard assist. ?  ? ?Ambulation/Gait ?Ambulation/Gait assistance: Min assist ?Gait Distance (Feet): 2 Feet ?Assistive device: Rolling walker (2 wheels) ?Gait Pattern/deviations: Step-to pattern ?  ?  ?  ?General Gait Details: 2 steps with RW, hop-to pattern. Pt very unsteady and requesting return to EOB. Pt sat EOB then performed pivot to recliner. ? ?Stairs ?  ?  ?  ?  ?  ? ?Wheelchair Mobility ?  ? ?Modified Rankin (Stroke Patients Only) ?  ? ?  ? ?Balance Overall balance assessment: Needs assistance ?Sitting-balance support: No upper extremity supported, Feet supported ?Sitting balance-Leahy Scale: Good ?  ?  ?Standing balance support: Bilateral upper extremity supported, During functional activity, Reliant on assistive device for balance ?Standing balance-Leahy Scale: Poor ?Standing balance comment: reliant on RW due to NWB status RLE ?  ?  ?  ?  ?  ?  ?  ?  ?  ?  ?  ?   ? ? ? ?Pertinent Vitals/Pain Pain Assessment ?Pain Assessment: 0-10 ?Pain Score: 7  ?Pain Descriptors / Indicators: Grimacing, Discomfort, Operative site guarding, Throbbing ?Pain Intervention(s): Limited activity within patient's tolerance, Monitored during session, Repositioned, Premedicated before session  ? ? ?Home Living Family/patient expects to  be discharged to:: Private residence ?Living Arrangements: Alone ?Available Help at Discharge: Family;Available PRN/intermittently ?Type of Home: House ?Home Access: Level entry ?  ?  ?  ?Home Layout: One level ?Home Equipment: Conservation officer, nature (2 wheels);Cane - single point ?   ?  ?Prior  Function Prior Level of Function : Independent/Modified Independent ?  ?  ?  ?  ?  ?  ?  ?ADLs Comments: does not drive. Daughter takes him to grocery store and MD appts. Daughter checks on him daily. ?  ? ? ?Hand Dominance  ?   ? ?  ?Extremity/Trunk Assessment  ? Upper Extremity Assessment ?Upper Extremity Assessment: RUE deficits/detail ?RUE Deficits / Details: s/p transmet amp ?  ? ?  ?  ? ?   ?Communication  ? Communication: No difficulties  ?Cognition Arousal/Alertness: Awake/alert ?Behavior During Therapy: [facility] for tasks assessed/performed ?Overall Cognitive Status: Within Functional Limits for tasks assessed ?  ?  ?  ?  ?  ?  ?  ?  ?  ?  ?  ?  ?  ?  ?  ?  ?  ?  ?  ? ?  ?General Comments   ? ?  ?Exercises    ? ?Assessment/Plan  ?  ?PT Assessment Patient needs continued PT services  ?PT Problem List Decreased mobility;Decreased knowledge of precautions;Decreased activity tolerance;Pain;Decreased balance;Decreased knowledge of use of DME ? ?   ?  ?PT Treatment Interventions DME instruction;Therapeutic activities;Gait training;Therapeutic exercise;Patient/family education;Balance training;Functional mobility training   ? ?PT Goals (Current goals can be found in the Care Plan section)  ?Acute Rehab PT Goals ?Patient Stated Goal: home ?PT Goal Formulation: With patient ?Time For Goal Achievement: 06/08/21 ?Potential to Achieve Goals: Good ? ?  ?Frequency Min 3X/week ?  ? ? ?Co-evaluation   ?  ?  ?  ?  ? ? ?  ?AM-PAC PT "6 Clicks" Mobility  ?Outcome Measure Help needed turning from your back to your side while in a flat bed without using bedrails?: None ?Help needed moving from lying on your back to sitting on the side of a flat bed without using bedrails?: None ?Help needed moving to and from a bed to a chair (including a wheelchair)?: A Little ?Help needed standing up from a chair using your arms (e.g., wheelchair or bedside chair)?: A Little ?Help needed to walk in hospital room?: A Lot ?Help needed climbing 3-5  steps with a railing? : Total ?6 Click Score: 17 ? ?  ?End of Session Equipment Utilized During Treatment: Gait belt ?Activity Tolerance: Patient tolerated treatment well ?Patient left: in chair;with call bell/phone within reach ?Nurse Communication: Mobility status ?PT Visit Diagnosis: Unsteadiness on feet (R26.81);Other abnormalities of gait and mobility (R26.89);Pain ?Pain - Right/Left: Right ?Pain - part of body: Ankle and joints of foot ?  ? ?Time: 8341-9622 ?PT Time Calculation (min) (ACUTE ONLY): 18 min ? ? ?Charges:   PT Evaluation ?$PT Eval Moderate Complexity: 1 Mod ?  ?  ?   ? ? ?Lorrin Goodell, PT  ?Office # [phone] ?Pager [phone] ? ? ?Lorriane Shire ?05/25/2021, 8:10 AM ? ?

## 2021-05-25 NOTE — Progress Notes (Signed)
?PROGRESS NOTE ? ?[name] HWE:[id number] DOB: [birth year]  ? ?PCP: [name], MD ? ?Patient is from: Home. ? ?DOA: 05/22/2021 LOS: 3 ? ?Chief complaints ?Chief Complaint  ?Patient presents with  ? Wound Infection  ?  ? ?Brief Narrative / Interim history: ?61 year old M with PMH of IDDM-2, HTN, osteomyelitis s/p multiple right toe amputations directed to ED by podiatry due to right forefoot infection and osteomyelitis after he failed conservative care with oral antibiotics outpatient for months.  Started on vancomycin, ceftriaxone and Flagyl.  MRI concerning for osteomyelitis in the first metatarsal stump and second and third metatarsal bones as well as second second phalanxes with surrounding soft tissue edema.  He underwent right TMT amputation with wound VAC placement by Dr. Sherryle Lis on 5/5.  Blood cultures NGTD.  Surgical culture with few E. coli and few Enterococcus faecalis.  Remains on broad-spectrum antibiotics pending culture sensitivity.  Patient to return to the OR for delayed secondary closure and possible revision of the amputation on 5/7. ?  ? ?Subjective: ?Seen and examined earlier this morning.  No major events overnight of this morning.  He complains significant pain in his right foot.  He rates the pain 9/10.  He describes the pain as throbbing.  He has some phantom pain.  Asking for some bowel regimen.  LBM 2 days ago. ? ?Objective: ?Vitals:  ? 05/25/21 0413 05/25/21 0721 05/25/21 1145 05/25/21 1615  ?BP: 120/64 (!) 144/78 139/76 123/73  ?Pulse: 92 94 95 96  ?Resp: '18 17 18 17  '$ ?Temp: 98.6 ?F (37 ?C) 98.2 ?F (36.8 ?C) (!) 97.4 ?F (36.3 ?C) 98.1 ?F (36.7 ?C)  ?TempSrc: Oral Oral Oral Oral  ?SpO2: 98% 100% 99% 100%  ?Weight:      ?Height:      ? ? ?Examination: ? ?GENERAL: No apparent distress.  Nontoxic. ?HEENT: MMM.  Vision and hearing grossly intact.  ?NECK: Supple.  No apparent JVD.  ?RESP:  No IWOB.  Fair aeration bilaterally. ?CVS:  RRR. Heart sounds normal.  ?ABD/GI/GU: BS+. Abd  soft, NTND.  ?MSK/EXT:  Moves extremities.  Wound VAC to right foot. ?SKIN: Wound VAC to right foot.  Small serosanguineous output in canister. ?NEURO: Awake, alert and oriented appropriately.  No apparent focal neuro deficit. ?PSYCH: Calm. Normal affect.  ? ?Procedures:  ?5/5-right TMT amputation and wound VAC placement. ? ?Microbiology summarized: ?Blood cultures NGTD. ?Surgical tissue culture with E. coli Enterococcus faecalis. ? ?Assessment and Plan: ?* Acute osteomyelitis of right foot ([name]) ?History of amputation of the 1st, 3rd and 4th toes of right foot by general surgery Dr. Arnoldo Morale.  Failed outpatient treatment with oral antibiotics.  MRI concerning for osteomyelitis in right first metatarsal stump, second and third metatarsal bone and second phalanxes.  ABI 1.52 and 1.48 in right PTA and DP respectively with triphasic waveform suggesting noncompressible vessels. ?-S/p right TMT amputation and wound VAC placement by Dr. Sherryle Lis on 5/5. ?-Blood cultures NGTD.  Tissue culture was E. coli and Enterococcus faecalis. ?-Continue broad-spectrum antibiotics with vanc, CTX and Flagyl ?-Patient will return to the OR for secondary closure and possible revision of amputation on 5/7 ?-Need ID consult after culture results. ?-Optimize diabetic control ?-Increase atorvastatin to 80 mg daily. ? ?AKI (acute kidney injury) ([place]) ?Recent Labs  ?  08/27/20 ?1010 08/28/20 ?0305 08/29/20 ?0522 08/30/20 ?8938 01/26/21 ?1119 04/17/21 ?1530 05/22/21 ?1529 05/23/21 ?1017 05/24/21 ?5102 05/25/21 ?0100  ?BUN 18 24* 22* '15 19 16 12 14 12 8  '$ ?CREATININE 0.84 0.97  0.98 0.77 0.92 0.90 1.21 0.89 0.82 0.78  ?Resolved.  Continue holding losartan and Lasix.  Avoid nephrotoxic meds. ? ?Controlled IDDM-2 with hyperglycemia ?A1c 6.7%.  Home regimen Basaglar 45 units twice daily, metformin and Januvia. ?Recent Labs  ?Lab 05/24/21 ?2021 05/25/21 ?6213 05/25/21 ?0865 05/25/21 ?1147 05/25/21 ?1616  ?GLUCAP 211* 212* 192* 173* 279*  ?-Continue  SSI-moderate ?-Add basal insulin after surgery.  He will be n.p.o. tonight. ?-Continue gabapentin and Lipitor. ? ?Anxiety ?Continue Wellbutrin and Xanax ? ?HTN (hypertension) ?Normotensive.   ?-Continue amlodipine, diltiazem ?-losartan and Lasix initially held due to AKI ? ? ?DVT prophylaxis:  ?SCDs Start: 05/22/21 2229 ? ?Code Status: Full code ?Family Communication: Updated patient's wife at bedside. ?Level of care: Telemetry Medical ?Status is: Inpatient ?Remains inpatient appropriate because: Acute right foot osteomyelitis requiring IV antibiotics and surgical intervention ? ? ?Final disposition: TBD ?Consultants:  ?Podiatry ? ?Sch Meds:  ?Scheduled Meds: ? ALPRAZolam  0.5 mg Oral TID  ? amLODipine  5 mg Oral Daily  ? vitamin C  500 mg Oral Daily  ? [START ON 05/27/2021] aspirin EC  81 mg Oral Daily  ? [START ON 05/26/2021] atorvastatin  80 mg Oral Daily  ? buPROPion  300 mg Oral Daily  ? diltiazem  420 mg Oral Daily  ? DULoxetine  60 mg Oral Daily  ? feeding supplement  237 mL Oral BID BM  ? gabapentin  800 mg Oral TID  ? insulin aspart  0-15 Units Subcutaneous TID PC,HS,0200  ? metroNIDAZOLE  500 mg Oral Q12H  ? multivitamin with minerals  1 tablet Oral Daily  ? pantoprazole  40 mg Oral Daily  ? pneumococcal 23 valent vaccine  0.5 mL Intramuscular Tomorrow-1000  ? zinc sulfate  220 mg Oral Daily  ? ?Continuous Infusions: ? cefTRIAXone (ROCEPHIN)  IV 2 g (05/24/21 1922)  ? vancomycin 1,250 mg (05/25/21 7846)  ? ?PRN Meds:.HYDROmorphone (DILAUDID) injection, oxyCODONE-acetaminophen **AND** [DISCONTINUED] oxyCODONE, polyethylene glycol, senna-docusate, tiZANidine ? ?Antimicrobials: ?Anti-infectives (From admission, onward)  ? ? Start     Dose/Rate Route Frequency Ordered Stop  ? 05/23/21 1900  vancomycin (VANCOCIN) IVPB 1000 mg/200 mL premix  Status:  Discontinued       ? 1,000 mg ?200 mL/hr over 60 Minutes Intravenous Every 12 hours 05/22/21 1849 05/23/21 0807  ? 05/23/21 1900  vancomycin (VANCOREADY) IVPB 1250  mg/250 mL       ? 1,250 mg ?166.7 mL/hr over 90 Minutes Intravenous Every 12 hours 05/23/21 0807    ? 05/22/21 2200  metroNIDAZOLE (FLAGYL) tablet 500 mg       ? 500 mg Oral Every 12 hours 05/22/21 1828 05/29/21 2159  ? 05/22/21 1900  vancomycin (VANCOREADY) IVPB 2000 mg/400 mL       ? 2,000 mg ?200 mL/hr over 120 Minutes Intravenous  Once 05/22/21 1849 05/22/21 2157  ? 05/22/21 1830  cefTRIAXone (ROCEPHIN) 2 g in sodium chloride 0.9 % 100 mL IVPB       ? 2 g ?200 mL/hr over 30 Minutes Intravenous Every 24 hours 05/22/21 1828 05/29/21 1829  ? ?  ? ? ? ?I have personally reviewed the following labs and images: ?CBC: ?Recent Labs  ?Lab 05/22/21 ?1529 05/23/21 ?9629 05/24/21 ?5284 05/25/21 ?0100  ?WBC 10.7* 8.4 9.0 8.4  ?NEUTROABS 6.8  --   --   --   ?HGB 10.9* 9.8* 9.8* 9.6*  ?HCT 33.6* 29.9* 30.2* 29.9*  ?MCV 83.6 83.5 83.2 83.8  ?PLT 417* 344 350 370  ? ?BMP &  GFR ?Recent Labs  ?Lab 05/22/21 ?1529 05/23/21 ?7494 05/24/21 ?4967 05/25/21 ?0100  ?NA 139 139 140 140  ?K 3.9 3.7 4.0 4.2  ?CL 106 110 109 109  ?CO2 '25 23 25 24  '$ ?GLUCOSE 106* 199* 130* 147*  ?BUN '12 14 12 8  '$ ?CREATININE 1.21 0.89 0.82 0.78  ?CALCIUM 9.1 8.0* 8.6* 8.6*  ? ?Estimated Creatinine Clearance: 110.7 mL/min (by C-G formula based on SCr of 0.78 mg/dL). ?Liver & Pancreas: ?Recent Labs  ?Lab 05/22/21 ?1529 05/24/21 ?5916 05/25/21 ?0100  ?AST '25 16 18  '$ ?ALT '27 26 23  '$ ?ALKPHOS 82 92 87  ?BILITOT 0.3 0.3 0.3  ?PROT 6.9 5.9* 5.8*  ?ALBUMIN 3.0* 2.5* 2.5*  ? ?No results for input(s): LIPASE, AMYLASE in the last 168 hours. ?No results for input(s): AMMONIA in the last 168 hours. ?Diabetic: ?Recent Labs  ?  05/22/21 ?2248  ?HGBA1C 6.7*  ? ?Recent Labs  ?Lab 05/24/21 ?2021 05/25/21 ?3846 05/25/21 ?6599 05/25/21 ?1147 05/25/21 ?1616  ?GLUCAP 211* 212* 192* 173* 279*  ? ?Cardiac Enzymes: ?No results for input(s): CKTOTAL, CKMB, CKMBINDEX, TROPONINI in the last 168 hours. ?No results for input(s): PROBNP in the last 8760 hours. ?Coagulation Profile: ?No results  for input(s): INR, PROTIME in the last 168 hours. ?Thyroid Function Tests: ?No results for input(s): TSH, T4TOTAL, FREET4, T3FREE, THYROIDAB in the last 72 hours. ?Lipid Profile: ?No results for input(s): CHOL,

## 2021-05-25 NOTE — Progress Notes (Signed)
?  Subjective:  ?Patient ID: [name], male    DOB: Feb 14, 1960,  MRN: [id number] ? ?Patient seen at bedside resting comfortably.  Says the block wore off about 2 AM and has had pain but pain that he was having before surgery is gone this is different and much better ? ?Negative for chest pain and shortness of breath ?Fever: no ?Night sweats: no ?Review of all other systems is negative ?Objective:  ? ?Vitals:  ? 05/25/21 0721 05/25/21 1145  ?BP: (!) 144/78 139/76  ?Pulse: 94 95  ?Resp: 17 18  ?Temp: 98.2 ?F (36.8 ?C) (!) 97.4 ?F (36.3 ?C)  ?SpO2: 100% 99%  ? ?General AA&O x3. Normal mood and affect.  ?Vascular Dorsalis pedis and posterior tibial pulses 2/4 bilat. ?Brisk capillary refill to skin. Pedal hair present.  ?Neurologic Epicritic sensation grossly intact.  ?Dermatologic VAC on and operating 125 mmHg continuous, 50 cc bloody and canister  ?Orthopedic: MMT 5/5 in dorsiflexion, plantarflexion, inversion, and eversion. ?Normal joint ROM without pain or crepitus.  ? ? ?Assessment & Plan:  ?Patient was evaluated and treated and all questions answered. ? ?Diabetic foot infection POD #1 status post transmetatarsal amputation ?-Doing well.  Plan to return the OR tomorrow for revision of amputation, wound closure Achilles tendon lengthening.  I discussed the rationale for this with him.  All questions were addressed.  Informed consent will be obtained ?-NWB RLE.  I discussed the importance of this with him and this will continue through discharge.  Appreciate PT assistance.  I discussed with him getting a knee scooter which he will look into, he says he may do well with a narrow wheelchair that would get into his bathroom ?-Continue VAC 125 mmHg continuous pressure ?-N.p.o. after midnight ? ?Criselda Peaches, DPM ? ?Accessible via secure chat for questions or concerns. ? ?

## 2021-05-25 NOTE — Progress Notes (Signed)
Pharmacy Antibiotic Note ? ?[name] is a 61 y.o. male admitted on 05/22/2021 with  diabetic foot infection and osteomyelitis .  On 5/5, the patient underwent a transmetatarsal R foot amputation. Per podiatry, the patient will return to the OR for delayed secondary closure and possible amputation site revision if necessary. Afebrile, SCr remains stable, WBC WNL. Pharmacy has been consulted for vancomycin dosing. ? ?Plan: ?Continue vancomycin IV 1250 mg Q12H ?Continue metronidazole PO Q12H per MD ?Continued ceftriaxone IV 2g Q24H per MD ?Follow cultures, clinical status, length of therapy, and renal function ?Consider obtaining a vancomycin peak and trough in the next few days if antibiotics are not de-escalated ? ?Height: '5\' 9"'$  (175.3 cm) ?Weight: 90.7 kg (200 lb) ?IBW/kg (Calculated) : 70.7 kg ? ?Temp (24hrs), Avg:98.1 ?F (36.7 ?C), Min:97.7 ?F (36.5 ?C), Max:98.6 ?F (37 ?C) ? ?Recent Labs  ?Lab 05/22/21 ?1529 05/22/21 ?1850 05/23/21 ?1761 05/24/21 ?6073 05/25/21 ?0100  ?WBC 10.7*  --  8.4 9.0 8.4  ?CREATININE 1.21  --  0.89 0.82 0.78  ?LATICACIDVEN  --  1.2  --   --   --   ?  ?Estimated Creatinine Clearance: 110.7 mL/min (by C-G formula based on SCr of 0.78 mg/dL).   ? ?Allergies  ?Allergen Reactions  ? Latex Rash and Other (See Comments)  ?  Redness   ? ? ?Antimicrobials this admission: ?5/3 ceftriaxone >> ?5/3 metronidazole >> ?5/3 vancomycin >> ? ?Dose adjustments this admission: none ? ?Microbiology results: ?5/5 R foot bone Cx: abundant GNR and GPC in pairs ?5/3 BCx: NGTD ?5/5 MRSA PCR: negative ? ?Thank you for allowing pharmacy to be a part of this patient?s care. ? ?Shauna Hugh, PharmD, RPh  ?PGY-2 Pharmacy Resident ?05/25/2021 11:41 AM ? ?Please check [email or web address] for unit-specific pharmacy phone numbers. ? ? ?

## 2021-05-26 ENCOUNTER — Other Ambulatory Visit: Payer: Self-pay

## 2021-05-26 ENCOUNTER — Encounter (HOSPITAL_COMMUNITY): Payer: Self-pay | Admitting: Family Medicine

## 2021-05-26 ENCOUNTER — Encounter (HOSPITAL_COMMUNITY)
Admission: EM | Disposition: A | Discharge status: Home Health | Payer: Self-pay | Source: Home / Self Care | Attending: Student

## 2021-05-26 ENCOUNTER — Inpatient Hospital Stay (HOSPITAL_COMMUNITY): Payer: Medicare Other | Admitting: Anesthesiology

## 2021-05-26 DIAGNOSIS — L089 Local infection of the skin and subcutaneous tissue, unspecified: Secondary | ICD-10-CM

## 2021-05-26 DIAGNOSIS — E11628 Type 2 diabetes mellitus with other skin complications: Secondary | ICD-10-CM

## 2021-05-26 DIAGNOSIS — F419 Anxiety disorder, unspecified: Secondary | ICD-10-CM | POA: Diagnosis not present

## 2021-05-26 DIAGNOSIS — T819XXA Unspecified complication of procedure, initial encounter: Secondary | ICD-10-CM

## 2021-05-26 DIAGNOSIS — M86171 Other acute osteomyelitis, right ankle and foot: Secondary | ICD-10-CM | POA: Diagnosis not present

## 2021-05-26 DIAGNOSIS — M21961 Unspecified acquired deformity of right lower leg: Secondary | ICD-10-CM

## 2021-05-26 DIAGNOSIS — E1165 Type 2 diabetes mellitus with hyperglycemia: Secondary | ICD-10-CM | POA: Diagnosis not present

## 2021-05-26 DIAGNOSIS — N179 Acute kidney failure, unspecified: Secondary | ICD-10-CM | POA: Diagnosis not present

## 2021-05-26 DIAGNOSIS — S91301A Unspecified open wound, right foot, initial encounter: Secondary | ICD-10-CM

## 2021-05-26 HISTORY — PX: ACHILLES TENDON SURGERY: SHX542

## 2021-05-26 HISTORY — PX: TRANSMETATARSAL AMPUTATION: SHX6197

## 2021-05-26 LAB — RENAL FUNCTION PANEL
Albumin: 2.4 g/dL — ABNORMAL LOW (ref 3.5–5.0)
Anion gap: 4 — ABNORMAL LOW (ref 5–15)
BUN: 12 mg/dL (ref 6–20)
CO2: 24 mmol/L (ref 22–32)
Calcium: 8.5 mg/dL — ABNORMAL LOW (ref 8.9–10.3)
Chloride: 110 mmol/L (ref 98–111)
Creatinine, Ser: 0.66 mg/dL (ref 0.61–1.24)
GFR, Estimated: >60 mL/min (ref >60)
Glucose, Bld: 197 mg/dL — ABNORMAL HIGH (ref 70–99)
Phosphorus: 3 mg/dL (ref 2.5–4.6)
Potassium: 4.4 mmol/L (ref 3.5–5.1)
Sodium: 138 mmol/L (ref 135–145)

## 2021-05-26 LAB — LIPID PANEL
Cholesterol: 75 mg/dL (ref 0–200)
HDL: 26 mg/dL — ABNORMAL LOW (ref >40)
LDL Cholesterol: 28 mg/dL (ref 0–99)
Total CHOL/HDL Ratio: 2.9 RATIO
Triglycerides: 106 mg/dL (ref <150)
VLDL: 21 mg/dL (ref 0–40)

## 2021-05-26 LAB — C-REACTIVE PROTEIN: CRP: 3.1 mg/dL — ABNORMAL HIGH (ref <1.0)

## 2021-05-26 LAB — GLUCOSE, CAPILLARY
Glucose-Capillary: 155 mg/dL — ABNORMAL HIGH (ref 70–99)
Glucose-Capillary: 134 mg/dL — ABNORMAL HIGH (ref 70–99)
Glucose-Capillary: 151 mg/dL — ABNORMAL HIGH (ref 70–99)
Glucose-Capillary: 151 mg/dL — ABNORMAL HIGH (ref 70–99)
Glucose-Capillary: 258 mg/dL — ABNORMAL HIGH (ref 70–99)
Glucose-Capillary: 324 mg/dL — ABNORMAL HIGH (ref 70–99)

## 2021-05-26 LAB — SURGICAL PCR SCREEN
MRSA, PCR: NEGATIVE
Staphylococcus aureus: NEGATIVE

## 2021-05-26 LAB — CBC
HCT: 26.6 % — ABNORMAL LOW (ref 39.0–52.0)
Hemoglobin: 8.8 g/dL — ABNORMAL LOW (ref 13.0–17.0)
MCH: 27.5 pg (ref 26.0–34.0)
MCHC: 33.1 g/dL (ref 30.0–36.0)
MCV: 83.1 fL (ref 80.0–100.0)
Platelets: 356 10*3/uL (ref 150–400)
RBC: 3.2 MIL/uL — ABNORMAL LOW (ref 4.22–5.81)
RDW: 15.4 % (ref 11.5–15.5)
WBC: 8.2 10*3/uL (ref 4.0–10.5)
nRBC: 0.4 % — ABNORMAL HIGH (ref 0.0–0.2)

## 2021-05-26 LAB — SEDIMENTATION RATE: Sed Rate: 61 mm/hr — ABNORMAL HIGH (ref 0–16)

## 2021-05-26 LAB — MAGNESIUM: Magnesium: 1.5 mg/dL — ABNORMAL LOW (ref 1.7–2.4)

## 2021-05-26 SURGERY — AMPUTATION, FOOT, TRANSMETATARSAL
Anesthesia: Regional | Site: Foot | Laterality: Right

## 2021-05-26 MED ORDER — DEXAMETHASONE SODIUM PHOSPHATE 10 MG/ML IJ SOLN
INTRAMUSCULAR | Status: DC | PRN
  Administered 2021-05-26: 5 mg via INTRAVENOUS

## 2021-05-26 MED ORDER — BUPIVACAINE HCL (PF) 0.5 % IJ SOLN
INTRAMUSCULAR | Status: AC
  Filled 2021-05-26: qty 30

## 2021-05-26 MED ORDER — FENTANYL CITRATE (PF) 250 MCG/5ML IJ SOLN
INTRAMUSCULAR | Status: DC | PRN
  Administered 2021-05-26: 50 ug via INTRAVENOUS

## 2021-05-26 MED ORDER — INSULIN GLARGINE-YFGN 100 UNIT/ML ~~LOC~~ SOLN
10.0000 [IU] | Freq: Every day | SUBCUTANEOUS | Status: DC
  Administered 2021-05-26: 10 [IU] via SUBCUTANEOUS
  Filled 2021-05-26 (×2): qty 0.1

## 2021-05-26 MED ORDER — CHLORHEXIDINE GLUCONATE 0.12 % MT SOLN: 15.0000 mL | Freq: Once | OROMUCOSAL | Status: DC

## 2021-05-26 MED ORDER — BUPIVACAINE HCL (PF) 0.5 % IJ SOLN
INTRAMUSCULAR | Status: AC
  Filled 2021-05-26: qty 10

## 2021-05-26 MED ORDER — FENTANYL CITRATE (PF) 250 MCG/5ML IJ SOLN
INTRAMUSCULAR | Status: AC
  Filled 2021-05-26: qty 5

## 2021-05-26 MED ORDER — LACTATED RINGERS IV SOLN
INTRAVENOUS | Status: DC
Start: 2021-05-26 — End: 2021-05-26

## 2021-05-26 MED ORDER — LIDOCAINE HCL 2 % IJ SOLN
INTRAMUSCULAR | Status: AC
  Filled 2021-05-26: qty 40

## 2021-05-26 MED ORDER — MIDAZOLAM HCL 2 MG/2ML IJ SOLN
INTRAMUSCULAR | Status: DC | PRN
  Administered 2021-05-26 (×2): 1 mg via INTRAVENOUS

## 2021-05-26 MED ORDER — CHLORHEXIDINE GLUCONATE 0.12 % MT SOLN
OROMUCOSAL | Status: AC
  Administered 2021-05-26: 15 mL
  Filled 2021-05-26: qty 15

## 2021-05-26 MED ORDER — PROPOFOL 500 MG/50ML IV EMUL
INTRAVENOUS | Status: DC | PRN
Start: 2021-05-26 — End: 2021-05-26
  Administered 2021-05-26: 100 ug/kg/min via INTRAVENOUS

## 2021-05-26 MED ORDER — 0.9 % SODIUM CHLORIDE (POUR BTL) OPTIME
TOPICAL | Status: DC | PRN
Start: 2021-05-26 — End: 2021-05-26
  Administered 2021-05-26: 1000 mL

## 2021-05-26 MED ORDER — MIDAZOLAM HCL 2 MG/2ML IJ SOLN
INTRAMUSCULAR | Status: AC
  Filled 2021-05-26: qty 2

## 2021-05-26 MED ORDER — ONDANSETRON HCL 4 MG/2ML IJ SOLN
INTRAMUSCULAR | Status: DC | PRN
  Administered 2021-05-26: 4 mg via INTRAVENOUS

## 2021-05-26 MED ORDER — LIDOCAINE HCL (PF) 2 % IJ SOLN
INTRAMUSCULAR | Status: AC
  Filled 2021-05-26: qty 20

## 2021-05-26 MED ORDER — PROPOFOL 10 MG/ML IV BOLUS
INTRAVENOUS | Status: AC
  Filled 2021-05-26: qty 20

## 2021-05-26 MED ORDER — FENTANYL CITRATE (PF) 100 MCG/2ML IJ SOLN: 25.0000 ug | INTRAMUSCULAR | Status: DC | PRN

## 2021-05-26 MED ORDER — ROPIVACAINE HCL 5 MG/ML IJ SOLN
INTRAMUSCULAR | Status: DC | PRN
  Administered 2021-05-26: 40 mL via PERINEURAL

## 2021-05-26 MED ORDER — LIDOCAINE 2% (20 MG/ML) 5 ML SYRINGE
INTRAMUSCULAR | Status: DC | PRN
  Administered 2021-05-26: 60 mg via INTRAVENOUS

## 2021-05-26 MED ORDER — PROPOFOL 1000 MG/100ML IV EMUL
INTRAVENOUS | Status: AC
  Filled 2021-05-26: qty 100

## 2021-05-26 SURGICAL SUPPLY — 50 items
BAG COUNTER SPONGE SURGICOUNT (BAG) ×2 IMPLANT
BENZOIN TINCTURE PRP APPL 2/3 (GAUZE/BANDAGES/DRESSINGS) ×1 IMPLANT
BLADE AVERAGE 25X9 (BLADE) IMPLANT
BLADE OSCILLATING /SAGITTAL (BLADE) ×2 IMPLANT
BLADE SURG 15 STRL LF DISP TIS (BLADE) IMPLANT
BLADE SURG 15 STRL SS (BLADE) ×4
BNDG ELASTIC 4X5.8 VLCR STR LF (GAUZE/BANDAGES/DRESSINGS) ×1 IMPLANT
BNDG ELASTIC 6X5.8 VLCR STR LF (GAUZE/BANDAGES/DRESSINGS) ×1 IMPLANT
BNDG GAUZE ELAST 4 BULKY (GAUZE/BANDAGES/DRESSINGS) ×1 IMPLANT
CHLORAPREP W/TINT 26 (MISCELLANEOUS) IMPLANT
COVER SURGICAL LIGHT HANDLE (MISCELLANEOUS) ×2 IMPLANT
CUFF TOURN SGL QUICK 18X4 (TOURNIQUET CUFF) ×1 IMPLANT
CUFF TOURN SGL QUICK 24 (TOURNIQUET CUFF) ×2
CUFF TRNQT CYL 24X4X16.5-23 (TOURNIQUET CUFF) IMPLANT
DRSG PAD ABDOMINAL 8X10 ST (GAUZE/BANDAGES/DRESSINGS) ×1 IMPLANT
DRSG XEROFORM 1X8 (GAUZE/BANDAGES/DRESSINGS) ×1 IMPLANT
ELECT REM PT RETURN 9FT ADLT (ELECTROSURGICAL) ×2
ELECTRODE REM PT RTRN 9FT ADLT (ELECTROSURGICAL) IMPLANT
GAUZE PACKING IODOFORM 1/4X15 (PACKING) ×1 IMPLANT
GAUZE PAD ABD 8X10 STRL (GAUZE/BANDAGES/DRESSINGS) ×1 IMPLANT
GAUZE SPONGE 4X4 12PLY STRL (GAUZE/BANDAGES/DRESSINGS) ×2 IMPLANT
GAUZE XEROFORM 1X8 LF (GAUZE/BANDAGES/DRESSINGS) ×1 IMPLANT
GLOVE BIO SURGEON STRL SZ8 (GLOVE) ×1 IMPLANT
GLOVE BIOGEL PI IND STRL 8 (GLOVE) ×1 IMPLANT
GLOVE BIOGEL PI INDICATOR 8 (GLOVE)
GOWN STRL REUS W/ TWL LRG LVL3 (GOWN DISPOSABLE) ×2 IMPLANT
GOWN STRL REUS W/TWL LRG LVL3 (GOWN DISPOSABLE) ×4
KIT BASIN OR (CUSTOM PROCEDURE TRAY) ×2 IMPLANT
KIT TURNOVER KIT B (KITS) ×2 IMPLANT
NDL PRECISIONGLIDE 27X1.5 (NEEDLE) ×1 IMPLANT
NEEDLE PRECISIONGLIDE 27X1.5 (NEEDLE) ×2 IMPLANT
NS IRRIG 1000ML POUR BTL (IV SOLUTION) ×2 IMPLANT
PACK ORTHO EXTREMITY (CUSTOM PROCEDURE TRAY) ×2 IMPLANT
PAD ARMBOARD 7.5X6 YLW CONV (MISCELLANEOUS) ×4 IMPLANT
PAD CAST 4YDX4 CTTN HI CHSV (CAST SUPPLIES) IMPLANT
PADDING CAST COTTON 4X4 STRL (CAST SUPPLIES) ×8
SOL PREP POV-IOD 4OZ 10% (MISCELLANEOUS) ×2 IMPLANT
SPLINT FIBERGLASS 4X30 (CAST SUPPLIES) ×1 IMPLANT
STAPLER VISISTAT 35W (STAPLE) ×1 IMPLANT
STRIP CLOSURE SKIN 1/2X4 (GAUZE/BANDAGES/DRESSINGS) ×1 IMPLANT
SUT ETHILON 2 0 FS 18 (SUTURE) ×2 IMPLANT
SUT ETHILON 3 0 FSL (SUTURE) ×2 IMPLANT
SUT PDS AB 2-0 CT1 27 (SUTURE) ×2 IMPLANT
SUT PROLENE 4 0 PS 2 18 (SUTURE) IMPLANT
SUT VIC AB 3-0 PS2 18 (SUTURE) IMPLANT
SUT VICRYL 4-0 PS2 18IN ABS (SUTURE) ×1 IMPLANT
SYR CONTROL 10ML LL (SYRINGE) ×4 IMPLANT
TOWEL GREEN STERILE (TOWEL DISPOSABLE) ×2 IMPLANT
TUBE CONNECTING 12X1/4 (SUCTIONS) ×2 IMPLANT
YANKAUER SUCT BULB TIP NO VENT (SUCTIONS) IMPLANT

## 2021-05-26 NOTE — Op Note (Signed)
Patient Name: [name] ?DOB: October 21, 1960  ?MRN: [id number] ?  ?Date of Service: 05/22/2021 - 05/26/2021 ? ?Surgeon: Dr. Lanae Crumbly, DPM ?Assistants: None ?Pre-operative Diagnosis:  ?1.  Diabetic right foot infection ?2.  Open wound of right foot ?3.  Deformity of metatarsal right foot ?Post-operative Diagnosis:  ?1.  Diabetic right foot infection ?2.  Open wound of right foot ?3.  Deformity of metatarsal right foot ?Procedures: ? 1) partial resection metatarsal x4 ? 2) washout and secondary wound closure of open surgical wound right foot ?Pathology/Specimens: ?ID Type Source Tests Collected by Time Destination  ?1 : Right first metatarsal Tissue PATH Bone resection SURGICAL PATHOLOGY Criselda Peaches, DPM 05/26/2021 1034   ? ?Anesthesia: Regional block ?Hemostasis:  ?Total Tourniquet Time Documented: ?Calf (Right) - 7 minutes ?Total: Calf (Right) - 7 minutes ? ?Estimated Blood Loss: 150 mL ?Materials: * No implants in log * ?Medications: None ?Complications: No complication noted ? ?Indications for Procedure:  ?This is a 61 y.o. male with a history of diabetic foot infection abscess and osteomyelitis of the right foot.  He previously underwent open transmetatarsal amputation.  He returns today for further washout debridement and revision of the amputation and secondary wound closure.  Tendo Achilles lengthening was also recommended I discussed the rationale for this procedure with him in detail we discussed the risk benefits and potential complications prior to surgery all questions were addressed.  No guarantees as the outcome of surgery were able to be made. ?  ?Procedure in Detail: ?Patient was identified in pre-operative holding area. Formal consent was signed and the right lower extremity was marked. Patient was brought back to the operating room. Anesthesia was induced. The extremity was prepped and draped in the usual sterile fashion. Timeout was taken to confirm patient name, laterality, and procedure  prior to incision.  ? ?Attention was then directed to the right foot which exhibited a open wound with a previous transmetatarsal amputation.  I began by debriding the wound of any nonviable appearing tissue in an excisional manner using scalpel and rongeur and cautery.  The metatarsal stumps were exposed, he previously had prior first metatarsal ray resection, the metatarsal needed to be shortened to realign the metatarsal parabola to prevent pressure and recurrence of ulceration.  A sagittal saw was used to resect the metatarsals to recreate the parabola.  The stump of the first metatarsal had a significant amount of heterotopic ossification and scar tissue, it appeared healthy at the resection margin, the resected portion of the first metatarsal which did appear somewhat abnormal was sent to pathology as a specimen.  Following this hemostasis was achieved and 3 L sterile saline was irrigated into the wound with a pulse irrigator. ? ?The soft tissue flaps were mobilized and reshaped using a scalpel.  Complex layered closure was then initiated utilizing 2-0 PDS 3-0 and 2-0 nylon and skin staples.  Appropriate apposition of the skin flaps was achieved.  Perfusion was excellent.  I was unable to see any further devitalized tissue before closure. ? ?  I then turned my attention to the posterior Achilles and a triple hemisection tendo Achilles lengthening was created, an additional fourth incision was made due to the significant contracture that was here.  After adequate release of the contracture was noted with gentle dorsiflexion applied on the foot the skin was closed with staples. ? ?The foot was then dressed with Xeroform dry sterile dressings and a well-padded below the knee fiberglass splint with the foot at  a right ankle to the leg. Patient tolerated the procedure well. ?  ?Disposition: ?Following a period of post-operative monitoring, patient will be transferred to the floor.  He will be nonweightbearing in  the splint.  He should be stable for discharge tomorrow or after physical therapy. ? ?

## 2021-05-26 NOTE — H&P (Signed)
History and Physical Interval Note: ? ?05/26/2021 ?9:48 AM ? ?[name]  has presented today for surgery, with the diagnosis of right foot .  The various methods of treatment have been discussed with the patient and family. After consideration of risks, benefits and other options for treatment, the patient has consented to  ?  ?Procedure(s): REVISION AMPUTATION AND WOUND CLOSURE (Right) ?ACHILLES LENGTHENING  (Right) as a surgical intervention.  The patient's history has been reviewed, patient examined, no change in status, stable for surgery.  I have reviewed the patient's chart and labs.  Questions were answered to the patient's satisfaction.   ? ? ?Stephan Minister Sarthak Rubenstein ? ? ?

## 2021-05-26 NOTE — Anesthesia Procedure Notes (Signed)
Anesthesia Regional Block: Adductor canal block  ? ?Pre-Anesthetic Checklist: , timeout performed,  Correct Patient, Correct Site, Correct Laterality,  Correct Procedure, Correct Position, site marked,  Risks and benefits discussed,  Surgical consent,  Pre-op evaluation,  At surgeon's request and post-op pain management ? ?Laterality: Right ? ?Prep: Dura Prep     ?  ?Needles:  ?Injection technique: Single-shot ? ?Needle Type: Echogenic Stimulator Needle   ? ? ?Needle Length: 10cm  ?Needle Gauge: 20  ? ? ? ?Additional Needles: ? ? ?Procedures:,,,, ultrasound used (permanent image in chart),,    ?Narrative:  ?Start time: 05/26/2021 9:18 AM ?End time: 05/26/2021 9:21 AM ?Injection made incrementally with aspirations every 5 mL. ? ?Performed by: Personally  ?Anesthesiologist: Darral Dash, DO ? ?Additional Notes: ?Patient identified. Risks/Benefits/Options discussed with patient including but not limited to bleeding, infection, nerve damage, failed block, incomplete pain control. Patient expressed understanding and wished to proceed. All questions were answered. Sterile technique was used throughout the entire procedure. Please see nursing notes for vital signs. Aspirated in 5cc intervals with injection for negative confirmation. Patient was given instructions on fall risk and not to get out of bed. All questions and concerns addressed with instructions to call with any issues or inadequate analgesia.   ?  ? ? ? ? ?

## 2021-05-26 NOTE — Progress Notes (Signed)
When pt. Arrived to short Stay Vancomycin infusing. When trying to raise IV pole the tubing came apart from the vancomycin tube and the remainder of vancomycin fell in the floor. Notified pharmacy, they stated pt. Received  1 gram of the vancomycin.Dr. Sherryle Lis notified. Stated not to give another dose or a small dose to make up for the loss. ?

## 2021-05-26 NOTE — Transfer of Care (Signed)
Immediate Anesthesia Transfer of Care Note ? ?Patient: [name] ? ?Procedure(s) Performed: TRANSMETATARSAL AMPUTATION Revision (Right: Foot) ?ACHILLES LENGTHENING/KIDNER (Right: Foot) ? ?Patient Location: PACU ? ?Anesthesia Type:MAC combined with regional for post-op pain ? ?Level of Consciousness: drowsy, patient cooperative and responds to stimulation ? ?Airway & Oxygen Therapy: Patient Spontanous Breathing and Patient connected to face mask oxygen ? ?Post-op Assessment: Report given to RN, Post -op Vital signs reviewed and stable and Patient moving all extremities X 4 ? ?Post vital signs: Reviewed and stable ? ?Last Vitals:  ?Vitals Value Taken Time  ?BP 114/74 05/26/21 1129  ?Temp    ?Pulse 82 05/26/21 1129  ?Resp 10 05/26/21 1129  ?SpO2 94 % 05/26/21 1129  ?Vitals shown include unvalidated device data. ? ?Last Pain:  ?Vitals:  ? 05/26/21 0920  ?TempSrc:   ?PainSc: 6   ?   ? ?Patients Stated Pain Goal: 5 (05/26/21 0845) ? ?Complications: No notable events documented. ?

## 2021-05-26 NOTE — Anesthesia Procedure Notes (Signed)
Procedure Name: [place] ?Date/Time: 05/26/2021 10:11 AM ?Performed by: Rande Brunt, CRNA ?Pre-anesthesia Checklist: Patient identified, Emergency Drugs available, Suction available and Patient being monitored ?Patient Re-evaluated:Patient Re-evaluated prior to induction ?Oxygen Delivery Method: Simple face mask ?Preoxygenation: Pre-oxygenation with 100% oxygen ?Induction Type: IV induction ?Placement Confirmation: positive ETCO2 and CO2 detector ?Dental Injury: Teeth and Oropharynx as per pre-operative assessment  ? ? ? ? ?

## 2021-05-26 NOTE — Progress Notes (Signed)
?PROGRESS NOTE ? ?[name] YBW:[id number] DOB: January 11, 1961  ? ?PCP: [name], MD ? ?Patient is from: Home. ? ?DOA: 05/22/2021 LOS: 4 ? ?Chief complaints ?Chief Complaint  ?Patient presents with  ? Wound Infection  ?  ? ?Brief Narrative / Interim history: ?61 year old M with PMH of IDDM-2, HTN, osteomyelitis s/p multiple right toe amputations directed to ED by podiatry due to right forefoot infection and osteomyelitis after he failed conservative care with oral antibiotics outpatient for months.  Started on vancomycin, ceftriaxone and Flagyl.  MRI concerning for osteomyelitis in the first metatarsal stump and second and third metatarsal bones as well as second second phalanxes with surrounding soft tissue edema.  He underwent right TMT amputation with wound VAC placement by Dr. Sherryle Lis on 5/5.  Blood cultures NGTD.  Surgical culture with few E. coli and few Enterococcus faecalis. Susceptibilities pending.  Returned to the OR and had partial resection of metatarsal x4 and washout and secondary wound closure of open surgical wound on 5/7.   Remains on BSA with Vanco, CTX and Flagyl. ? ?Therapy recommended home health PT/OT. ?  ? ?Subjective: ?Seen and examined earlier this afternoon after he returned from surgery.  No major events overnight.  Currently pain-free after surgery.  Denies chest pain, dyspnea, GI or UTI symptoms. ? ?Objective: ?Vitals:  ? 05/26/21 1144 05/26/21 1200 05/26/21 1229 05/26/21 1558  ?BP: 120/72 125/85 130/78 137/75  ?Pulse: 79 83 84 100  ?Resp: '11 14 15 17  '$ ?Temp:  97.7 ?F (36.5 ?C) 97.7 ?F (36.5 ?C) 98.2 ?F (36.8 ?C)  ?TempSrc:   Oral Oral  ?SpO2: 97% 96% 99% 100%  ?Weight:      ?Height:      ? ? ?Examination: ? ?GENERAL: No apparent distress.  Nontoxic. ?HEENT: MMM.  Vision and hearing grossly intact.  ?NECK: Supple.  No apparent JVD.  ?RESP:  No IWOB.  Fair aeration bilaterally. ?CVS:  RRR. Heart sounds normal.  ?ABD/GI/GU: BS+. Abd soft, NTND.  ?MSK/EXT:  Moves extremities.   Bulky dressing over right foot.  ?SKIN: no apparent skin lesion or wound ?NEURO: Awake and alert. Oriented appropriately.  No apparent focal neuro deficit. ?PSYCH: Calm. Normal affect.  ? ?Procedures:  ?5/5-right TMT amputation and wound VAC placement. ? ?Microbiology summarized: ?Blood cultures NGTD. ?Surgical tissue culture with E. coli Enterococcus faecalis. ? ?Assessment and Plan: ?* Acute osteomyelitis of right foot ([place]) ?History of amputation of the 1st, 3rd and 4th toes of right foot by general surgery Dr. Arnoldo Morale.  Failed outpatient treatment with oral antibiotics.  MRI concerning for osteomyelitis in right first metatarsal stump, second and third metatarsal bone and second phalanxes.  ABI 1.52 and 1.48 in right PTA and DP respectively with triphasic waveform suggesting noncompressible vessels. ?-S/p right TMT amputation and wound VAC placement by Dr. Sherryle Lis on 5/5. ?-S/p partial resection of right metatarsal x4, washout and secondary wound closure ?-Blood cultures NGTD.  Tissue culture was E. coli and Enterococcus faecalis.  Sensitivity pending ?-Continue broad-spectrum antibiotics with vanc, CTX and Flagyl pending tissue culture sensitivity ?-Podiatry recommended oral antibiotics on discharge for additional 1 week based on sensitivity ?-NWB in a splint on right foot. ?-Optimize diabetic control ?-Increased atorvastatin to 80 mg daily. ?-Low-dose aspirin 81 mg daily ? ?AKI (acute kidney injury) ([place]) ?Recent Labs  ?  08/28/20 ?0305 08/29/20 ?0522 08/30/20 ?7681 01/26/21 ?1119 04/17/21 ?1530 05/22/21 ?1529 05/23/21 ?1572 05/24/21 ?6203 05/25/21 ?0100 05/26/21 ?0100  ?BUN 24* 22* '15 19 16 12 14 12 '$ 8  12  ?CREATININE 0.97 0.98 0.77 0.92 0.90 1.21 0.89 0.82 0.78 0.66  ?Resolved.  Continue holding losartan and Lasix.  Avoid nephrotoxic meds. ? ?Controlled IDDM-2 with hyperglycemia ?A1c 6.7%.  Home regimen Basaglar 45 units twice daily, metformin and Januvia. ?Recent Labs  ?Lab 05/26/21 ?0317 05/26/21 ?0801  05/26/21 ?1134 05/26/21 ?1230 05/26/21 ?1558  ?GLUCAP 155* 134* 151* 151* 258*  ?-Continue SSI-moderate ?-Add Semglee 10 units daily ?-Further adjustment as appropriate. ?-Continue gabapentin and Lipitor. ? ?Anxiety ?Continue Wellbutrin and Xanax ? ?HTN (hypertension) ?Normotensive.   ?-Continue amlodipine, diltiazem ?-losartan and Lasix initially held due to AKI ? ? ?DVT prophylaxis:  ?SCDs Start: 05/22/21 2229 ? ?Code Status: Full code ?Family Communication: None at bedside today. ?Level of care: Telemetry Medical ?Status is: Inpatient ?Remains inpatient appropriate because: Acute right foot osteomyelitis requiring IV antibiotics ? ? ?Final disposition: Likely home with home health in the next 24 hours. ?Consultants:  ?Podiatry ? ?Sch Meds:  ?Scheduled Meds: ? ALPRAZolam  0.5 mg Oral TID  ? amLODipine  5 mg Oral Daily  ? vitamin C  500 mg Oral Daily  ? [START ON 05/27/2021] aspirin EC  81 mg Oral Daily  ? atorvastatin  80 mg Oral Daily  ? buPROPion  300 mg Oral Daily  ? diltiazem  420 mg Oral Daily  ? DULoxetine  60 mg Oral Daily  ? feeding supplement  237 mL Oral BID BM  ? gabapentin  800 mg Oral TID  ? insulin aspart  0-15 Units Subcutaneous TID PC,HS,0200  ? insulin glargine-yfgn  10 Units Subcutaneous Daily  ? metroNIDAZOLE  500 mg Oral Q12H  ? multivitamin with minerals  1 tablet Oral Daily  ? pantoprazole  40 mg Oral Daily  ? pneumococcal 23 valent vaccine  0.5 mL Intramuscular Tomorrow-1000  ? zinc sulfate  220 mg Oral Daily  ? ?Continuous Infusions: ? cefTRIAXone (ROCEPHIN)  IV 2 g (05/25/21 1855)  ? vancomycin 1,250 mg (05/26/21 0807)  ? ?PRN Meds:.HYDROmorphone (DILAUDID) injection, oxyCODONE-acetaminophen **AND** [DISCONTINUED] oxyCODONE, polyethylene glycol, senna-docusate, tiZANidine ? ?Antimicrobials: ?Anti-infectives (From admission, onward)  ? ? Start     Dose/Rate Route Frequency Ordered Stop  ? 05/23/21 1900  vancomycin (VANCOCIN) IVPB 1000 mg/200 mL premix  Status:  Discontinued       ? 1,000  mg ?200 mL/hr over 60 Minutes Intravenous Every 12 hours 05/22/21 1849 05/23/21 0807  ? 05/23/21 1900  vancomycin (VANCOREADY) IVPB 1250 mg/250 mL       ? 1,250 mg ?166.7 mL/hr over 90 Minutes Intravenous Every 12 hours 05/23/21 0807    ? 05/22/21 2200  metroNIDAZOLE (FLAGYL) tablet 500 mg       ? 500 mg Oral Every 12 hours 05/22/21 1828 05/29/21 2159  ? 05/22/21 1900  vancomycin (VANCOREADY) IVPB 2000 mg/400 mL       ? 2,000 mg ?200 mL/hr over 120 Minutes Intravenous  Once 05/22/21 1849 05/22/21 2157  ? 05/22/21 1830  cefTRIAXone (ROCEPHIN) 2 g in sodium chloride 0.9 % 100 mL IVPB       ? 2 g ?200 mL/hr over 30 Minutes Intravenous Every 24 hours 05/22/21 1828 05/29/21 1829  ? ?  ? ? ? ?I have personally reviewed the following labs and images: ?CBC: ?Recent Labs  ?Lab 05/22/21 ?1529 05/23/21 ?5329 05/24/21 ?9242 05/25/21 ?0100 05/26/21 ?0100  ?WBC 10.7* 8.4 9.0 8.4 8.2  ?NEUTROABS 6.8  --   --   --   --   ?HGB 10.9* 9.8* 9.8* 9.6* 8.8*  ?  HCT 33.6* 29.9* 30.2* 29.9* 26.6*  ?MCV 83.6 83.5 83.2 83.8 83.1  ?PLT 417* 344 350 370 356  ? ?BMP &GFR ?Recent Labs  ?Lab 05/22/21 ?1529 05/23/21 ?2992 05/24/21 ?4268 05/25/21 ?0100 05/26/21 ?0100  ?NA 139 139 140 140 138  ?K 3.9 3.7 4.0 4.2 4.4  ?CL 106 110 109 109 110  ?CO2 '25 23 25 24 24  '$ ?GLUCOSE 106* 199* 130* 147* 197*  ?BUN '12 14 12 8 12  '$ ?CREATININE 1.21 0.89 0.82 0.78 0.66  ?CALCIUM 9.1 8.0* 8.6* 8.6* 8.5*  ?MG  --   --   --   --  1.5*  ?PHOS  --   --   --   --  3.0  ? ?Estimated Creatinine Clearance: 110.7 mL/min (by C-G formula based on SCr of 0.66 mg/dL). ?Liver & Pancreas: ?Recent Labs  ?Lab 05/22/21 ?1529 05/24/21 ?3419 05/25/21 ?0100 05/26/21 ?0100  ?AST '25 16 18  '$ --   ?ALT '27 26 23  '$ --   ?ALKPHOS 82 92 87  --   ?BILITOT 0.3 0.3 0.3  --   ?PROT 6.9 5.9* 5.8*  --   ?ALBUMIN 3.0* 2.5* 2.5* 2.4*  ? ?No results for input(s): LIPASE, AMYLASE in the last 168 hours. ?No results for input(s): AMMONIA in the last 168 hours. ?Diabetic: ?No results for input(s): HGBA1C in the  last 72 hours. ? ?Recent Labs  ?Lab 05/26/21 ?0317 05/26/21 ?0801 05/26/21 ?1134 05/26/21 ?1230 05/26/21 ?1558  ?GLUCAP 155* 134* 151* 151* 258*  ? ?Cardiac Enzymes: ?No results for input(s): CKTOTAL, CKMB, CK

## 2021-05-26 NOTE — Anesthesia Procedure Notes (Signed)
Anesthesia Regional Block: Popliteal block  ? ?Pre-Anesthetic Checklist: , timeout performed,  Correct Patient, Correct Site, Correct Laterality,  Correct Procedure, Correct Position, site marked,  Risks and benefits discussed,  Surgical consent,  Pre-op evaluation,  At surgeon's request and post-op pain management ? ?Laterality: Right ? ?Prep: Dura Prep     ?  ?Needles:  ?Injection technique: Single-shot ? ?Needle Type: Echogenic Stimulator Needle   ? ? ?Needle Length: 10cm  ?Needle Gauge: 20  ? ? ? ?Additional Needles: ? ? ?Procedures:,,,, ultrasound used (permanent image in chart),,    ?Narrative:  ?Start time: 05/26/2021 9:23 AM ?End time: 05/26/2021 9:26 AM ?Injection made incrementally with aspirations every 5 mL. ? ?Performed by: Personally  ?Anesthesiologist: Darral Dash, DO ? ?Additional Notes: ?Patient identified. Risks/Benefits/Options discussed with patient including but not limited to bleeding, infection, nerve damage, failed block, incomplete pain control. Patient expressed understanding and wished to proceed. All questions were answered. Sterile technique was used throughout the entire procedure. Please see nursing notes for vital signs. Aspirated in 5cc intervals with injection for negative confirmation. Patient was given instructions on fall risk and not to get out of bed. All questions and concerns addressed with instructions to call with any issues or inadequate analgesia.   ?  ? ? ? ? ?

## 2021-05-26 NOTE — Anesthesia Postprocedure Evaluation (Signed)
Anesthesia Post Note ? ?Patient: [name] ? ?Procedure(s) Performed: TRANSMETATARSAL AMPUTATION Revision (Right: Foot) ?ACHILLES LENGTHENING/KIDNER (Right: Foot) ? ?  ? ?Patient location during evaluation: PACU ?Anesthesia Type: Regional and MAC ?Level of consciousness: awake and alert ?Pain management: pain level controlled ?Vital Signs Assessment: post-procedure vital signs reviewed and stable ?Respiratory status: spontaneous breathing, nonlabored ventilation, respiratory function stable and patient connected to nasal cannula oxygen ?Cardiovascular status: stable and blood pressure returned to baseline ?Postop Assessment: no apparent nausea or vomiting ?Anesthetic complications: no ? ? ?No notable events documented. ? ?Last Vitals:  ?Vitals:  ? 05/26/21 1144 05/26/21 1200  ?BP: 120/72 125/85  ?Pulse: 79 83  ?Resp: 11 14  ?Temp:  36.5 ?C  ?SpO2: 97% 96%  ?  ?Last Pain:  ?Vitals:  ? 05/26/21 1200  ?TempSrc:   ?PainSc: 0-No pain  ? ? ?  ?  ?  ?  ?  ?  ? ?[name] ? ? ? ? ?

## 2021-05-26 NOTE — Anesthesia Preprocedure Evaluation (Addendum)
Anesthesia Evaluation  ?Patient identified by MRN, date of birth, ID band ?Patient awake ? ? ? ?Reviewed: ?Allergy & Precautions, NPO status , Patient's Chart, lab work & pertinent test results ? ?Airway ?Mallampati: II ? ?TM Distance: >3 FB ?Neck ROM: Full ? ? ? Dental ?no notable dental hx. ? ?  ?Pulmonary ?neg pulmonary ROS, former smoker,  ?  ?Pulmonary exam normal ? ? ? ? ? ? ? Cardiovascular ?hypertension, Pt. on medications ? ?Rhythm:Regular Rate:Normal ? ? ?  ?Neuro/Psych ? Headaches, Anxiety   ? GI/Hepatic ?Neg liver ROS, PUD, GERD  Medicated,  ?Endo/Other  ?diabetes, Type 2, Oral Hypoglycemic Agents, Insulin Dependent ? Renal/GU ?  ?negative genitourinary ?  ?Musculoskeletal ? ?(+) Arthritis ,  ? Abdominal ?Normal abdominal exam  (+)   ?Peds ? Hematology ?negative hematology ROS ?(+)   ?Anesthesia Other Findings ? ? Reproductive/Obstetrics ? ?  ? ? ? ? ? ? ? ? ? ? ? ? ? ?  ?  ? ? ? ? ? ? ? ?Anesthesia Physical ?Anesthesia Plan ? ?ASA: 3 ? ?Anesthesia Plan: General and Regional  ? ?Post-op Pain Management: Regional block*, Tylenol PO (pre-op)* and Gabapentin PO (pre-op)*  ? ?Induction: Intravenous ? ?PONV Risk Score and Plan: 2 and Ondansetron, Dexamethasone, Midazolam and Treatment may vary due to age or medical condition ? ?Airway Management Planned: Mask and LMA ? ?Additional Equipment: None ? ?Intra-op Plan:  ? ?Post-operative Plan: Extubation in OR ? ?Informed Consent: I have reviewed the patients History and Physical, chart, labs and discussed the procedure including the risks, benefits and alternatives for the proposed anesthesia with the patient or authorized representative who has indicated his/her understanding and acceptance.  ? ? ? ?Dental advisory given ? ?Plan Discussed with: CRNA ? ?Anesthesia Plan Comments: (Lab Results ?     Component                Value               Date                 ?     WBC                      8.2                 05/26/2021            ?     HGB                      8.8 (L)             05/26/2021           ?     HCT                      26.6 (L)            05/26/2021           ?     MCV                      83.1                05/26/2021           ?     PLT  356                 05/26/2021           ?Lab Results ?     Component                Value               Date                 ?     NA                       138                 05/26/2021           ?     K                        4.4                 05/26/2021           ?     CO2                      24                  05/26/2021           ?     GLUCOSE                  197 (H)             05/26/2021           ?     BUN                      12                  05/26/2021           ?     CREATININE               0.66                05/26/2021           ?     CALCIUM                  8.5 (L)             05/26/2021           ?     GFRNONAA                 >60                 05/26/2021          )  ? ? ? ? ? ?Anesthesia Quick Evaluation ? ?

## 2021-05-26 NOTE — Plan of Care (Signed)
  Problem: Pain Managment: Goal: General experience of comfort will improve Outcome: Progressing   Problem: Safety: Goal: Ability to remain free from injury will improve Outcome: Progressing   

## 2021-05-27 ENCOUNTER — Encounter (HOSPITAL_COMMUNITY): Payer: Self-pay | Admitting: Podiatry

## 2021-05-27 DIAGNOSIS — M86171 Other acute osteomyelitis, right ankle and foot: Secondary | ICD-10-CM | POA: Diagnosis not present

## 2021-05-27 LAB — RENAL FUNCTION PANEL
Albumin: 2.4 g/dL — ABNORMAL LOW (ref 3.5–5.0)
Anion gap: 6 (ref 5–15)
BUN: 12 mg/dL (ref 6–20)
CO2: 23 mmol/L (ref 22–32)
Calcium: 8.6 mg/dL — ABNORMAL LOW (ref 8.9–10.3)
Chloride: 106 mmol/L (ref 98–111)
Creatinine, Ser: 0.82 mg/dL (ref 0.61–1.24)
GFR, Estimated: >60 mL/min (ref >60)
Glucose, Bld: 309 mg/dL — ABNORMAL HIGH (ref 70–99)
Phosphorus: 2.9 mg/dL (ref 2.5–4.6)
Potassium: 4.1 mmol/L (ref 3.5–5.1)
Sodium: 135 mmol/L (ref 135–145)

## 2021-05-27 LAB — GLUCOSE, CAPILLARY
Glucose-Capillary: 319 mg/dL — ABNORMAL HIGH (ref 70–99)
Glucose-Capillary: 381 mg/dL — ABNORMAL HIGH (ref 70–99)
Glucose-Capillary: 331 mg/dL — ABNORMAL HIGH (ref 70–99)

## 2021-05-27 LAB — CBC
HCT: 25.9 % — ABNORMAL LOW (ref 39.0–52.0)
Hemoglobin: 8.3 g/dL — ABNORMAL LOW (ref 13.0–17.0)
MCH: 26.5 pg (ref 26.0–34.0)
MCHC: 32 g/dL (ref 30.0–36.0)
MCV: 82.7 fL (ref 80.0–100.0)
Platelets: 397 10*3/uL (ref 150–400)
RBC: 3.13 MIL/uL — ABNORMAL LOW (ref 4.22–5.81)
RDW: 15.2 % (ref 11.5–15.5)
WBC: 13 10*3/uL — ABNORMAL HIGH (ref 4.0–10.5)
nRBC: 0.5 % — ABNORMAL HIGH (ref 0.0–0.2)

## 2021-05-27 LAB — AEROBIC/ANAEROBIC CULTURE W GRAM STAIN (SURGICAL/DEEP WOUND): Gram Stain: NONE SEEN

## 2021-05-27 LAB — CULTURE, BLOOD (ROUTINE X 2)
Culture: NO GROWTH
Culture: NO GROWTH
Special Requests: ADEQUATE
Special Requests: ADEQUATE

## 2021-05-27 LAB — MAGNESIUM: Magnesium: 1.7 mg/dL (ref 1.7–2.4)

## 2021-05-27 MED ORDER — AMOXICILLIN-POT CLAVULANATE 875-125 MG PO TABS
1.0000 | ORAL_TABLET | Freq: Two times a day (BID) | ORAL | Status: DC
  Administered 2021-05-27: 1 via ORAL
  Filled 2021-05-27: qty 1

## 2021-05-27 MED ORDER — CIPROFLOXACIN HCL 500 MG PO TABS: 500.0000 mg | ORAL_TABLET | Freq: Two times a day (BID) | ORAL | 0 refills | Status: AC

## 2021-05-27 MED ORDER — CIPROFLOXACIN HCL 500 MG PO TABS: 500.0000 mg | ORAL_TABLET | Freq: Two times a day (BID) | ORAL | Status: DC

## 2021-05-27 MED ORDER — INSULIN ASPART 100 UNIT/ML IJ SOLN
7.0000 [IU] | Freq: Three times a day (TID) | INTRAMUSCULAR | Status: DC
  Administered 2021-05-27: 7 [IU] via SUBCUTANEOUS

## 2021-05-27 MED ORDER — OXYCODONE-ACETAMINOPHEN 10-325 MG PO TABS
1.0000 | ORAL_TABLET | Freq: Four times a day (QID) | ORAL | 0 refills | Status: DC | PRN
Start: 2021-05-27 — End: 2022-02-14

## 2021-05-27 MED ORDER — AMOXICILLIN-POT CLAVULANATE 875-125 MG PO TABS: 1.0000 | ORAL_TABLET | Freq: Two times a day (BID) | ORAL | 0 refills | Status: AC

## 2021-05-27 MED ORDER — INSULIN GLARGINE-YFGN 100 UNIT/ML ~~LOC~~ SOLN
35.0000 [IU] | Freq: Every day | SUBCUTANEOUS | Status: DC
  Administered 2021-05-27: 35 [IU] via SUBCUTANEOUS
  Filled 2021-05-27: qty 0.35

## 2021-05-27 NOTE — Progress Notes (Addendum)
Occupational Therapy Evaluation ?Patient Details ?Name: [name] ?MRN: [id number] ?DOB: 1960-12-22 ?Today's Date: 05/27/2021 ? ? ?History of Present Illness Pt is a 61 y.o. male admitted 5/3 with osteomyelitis R foot. He underwent R transmet amputation 5/5. PMH: IDDM, HTN, h/o PLIF (2021), h/o R toe amputations  ? ?Clinical Impression ?  ?Patient is s/p R TMT amputation surgery resulting in functional limitations due to the deficits listed below (see OT problem list). Pt currently with fall risk using RW and inability to sustain NWB R LE. Pt could greatly benefit from w/c and BSC.  Patient will benefit from skilled OT acutely to increase independence and safety with ADLS to allow discharge hhot. ?  ?Patient suffers from R TMT amputation NWB R LE which impairs their ability to perform daily activities like bathing/ dressing/ toilet transfers in the home.  A walker alone will not resolve the issues with performing activities of daily living. A wheelchair will allow patient to safely perform daily activities.  The patient can self propel in the home or has a caregiver who can provide assistance.    ?Pt hip to hip ( width 19 inches), Hip to knee measures 18 inches. Based on measurements recommendations would be 21 width by 16 seat deep. Pt is requesting a very narrow chair so 20 by 16 would be the most narrow possible fit with 1 inch width allowance.  ? ? *provided handout for 3n1 in bath tub as reference for later use. Pt plans to sponge bath initially   ? ?Recommendations for follow up therapy are one component of a multi-disciplinary discharge planning process, led by the attending physician.  Recommendations may be updated based on patient status, additional functional criteria and insurance authorization.  ? ?Follow Up Recommendations ? Home health OT  ?  ?Assistance Recommended at Discharge Set up Supervision/Assistance  ?Patient can return home with the following A lot of help with walking and/or  transfers;A little help with bathing/dressing/bathroom;Assistance with cooking/housework;Assist for transportation ? ?  ?Functional Status Assessment ? Patient has had a recent decline in their functional status and demonstrates the ability to make significant improvements in function in a reasonable and predictable amount of time.  ?Equipment Recommendations ? BSC/3in1;Wheelchair (measurements OT);Wheelchair cushion (measurements OT)  ?  ?Recommendations for Other Services   ? ? ?  ?Precautions / Restrictions Precautions ?Precautions: Fall ?Restrictions ?Weight Bearing Restrictions: Yes ?RLE Weight Bearing: Non weight bearing  ? ?  ? ?Mobility Bed Mobility ?Overal bed mobility: Independent ?  ?  ?  ?  ?  ?  ?  ?  ? ?Transfers ?Overall transfer level: Needs assistance ?Equipment used: Rolling walker (2 wheels) ?Transfers: Sit to/from Stand ?Sit to Stand: Mod assist ?  ?  ?  ?  ?  ?General transfer comment: pt requires mod (A) and min cues for hand placement to transfer from bed to RW today. pt with L LE shaking. pt states "with my back sometimes thats how my leg does me". pt educated on w/c transfers for safety and good return demo ?  ? ?  ?Balance Overall balance assessment: Needs assistance ?  ?Sitting balance-Leahy Scale: Good ?  ?  ?Standing balance support: Bilateral upper extremity supported, Reliant on assistive device for balance, During functional activity ?Standing balance-Leahy Scale: Poor ?  ?  ?  ?  ?  ?  ?  ?  ?  ?  ?  ?  ?   ? ?ADL either performed or assessed with clinical  judgement  ? ?ADL Overall ADL's : Needs assistance/impaired ?Eating/Feeding: Independent ?  ?Grooming: Wash/dry hands ?  ?Upper Body Bathing: Set up;Sitting ?  ?Lower Body Bathing: Minimal assistance;Sitting/lateral leans ?  ?Upper Body Dressing : Set up;Sitting ?  ?Lower Body Dressing: Minimal assistance;Sitting/lateral leans ?Lower Body Dressing Details (indicate cue type and reason): pt is able to reach ankle with forward  flexion but will need to work on ability to complete sit<>stand and pull up bottoms. Pt will be able to thread underwear ?Toilet Transfer: Min guard;BSC/3in1;Stand-pivot (w/c to [facility]) ?  ?Toileting- Water quality scientist and Hygiene: Min guard ? Pt educated on putting trash bags in the bucket of the bedside commode with paper towel in the bottom to help with easy clean up to help him maximize his independence. Pt could then clean the [facility] and place the dirty bag into a large trash can in the home for the daughter to take out as needed.  ?  ?  ?  ?General ADL Comments: pt expressing concern and fear of knee scooter due to previous experience falling from scooter. pt expressing need for w/c and BSC  ? ? ? ?Vision Baseline Vision/History: 0 No visual deficits ?   ?   ?Perception   ?  ?Praxis   ?  ? ?Pertinent Vitals/Pain Pain Assessment ?Pain Assessment: 0-10 ?Pain Score: 10-Worst pain ever ?Pain Descriptors / Indicators: Grimacing, Discomfort, Operative site guarding, Throbbing ?Pain Intervention(s): Monitored during session, Premedicated before session, Repositioned, Other (comment) (Rn present at the end of session addressing needs)  ? ? ? ?Hand Dominance Right ?  ?Extremity/Trunk Assessment Upper Extremity Assessment ?Upper Extremity Assessment: Overall WFL for tasks assessed ?  ?Lower Extremity Assessment ?Lower Extremity Assessment: Defer to PT evaluation;RLE deficits/detail ?RLE Deficits / Details: s/p R transmetarsal amputations ?  ?Cervical / Trunk Assessment ?Cervical / Trunk Assessment: Back Surgery (hx of fall 2021 surg) ?  ?Communication Communication ?Communication: No difficulties ?  ?Cognition Arousal/Alertness: Awake/alert ?Behavior During Therapy: [facility] for tasks assessed/performed ?Overall Cognitive Status: Within Functional Limits for tasks assessed ?  ?  ?  ?  ?  ?  ?  ?  ?  ?  ?  ?  ?  ?  ?  ?  ?  ?  ?  ?General Comments  R LE dressing Dry and sustained NWB with w/c transfer. pt with risk for weight  bearing with RW ? ?  ?Exercises   ?  ?Shoulder Instructions    ? ? ?Home Living Family/patient expects to be discharged to:: Private residence ?Living Arrangements: Alone ?Available Help at Discharge: Family;Available PRN/intermittently ?Type of Home: House ?Home Access: Level entry ?  ?  ?Home Layout: One level ?  ?  ?Bathroom Shower/Tub: Tub/shower unit ?  ?Bathroom Toilet: Standard ?Bathroom Accessibility: No ?  ?Home Equipment: Conservation officer, nature (2 wheels);Cane - single point ?  ?Additional Comments: reports having chickens in a pin that a neighbor can help feed/ clean up after. Daughter checks on him daily and works as a Quarry manager. ?  ? ?  ?Prior Functioning/Environment Prior Level of Function : Independent/Modified Independent ?  ?  ?  ?  ?  ?  ?  ?ADLs Comments: does not drive. Daughter takes him to grocery store and MD appts. Daughter checks on him daily. reports x2 trucks but inability to drive since first surgery ?  ? ?  ?  ?OT Problem List: Decreased strength;Impaired balance (sitting and/or standing);Decreased activity tolerance;Decreased safety awareness;Decreased knowledge of use of DME  or AE;Decreased knowledge of precautions;Obesity ?  ?   ?OT Treatment/Interventions: Self-care/ADL training;Therapeutic exercise;Energy conservation;DME and/or AE instruction;Manual therapy;Therapeutic activities;Modalities;Patient/family education;Balance training  ?  ?OT Goals(Current goals can be found in the care plan section) Acute Rehab OT Goals ?Patient Stated Goal: to get a w/c that is small enough for my house ?OT Goal Formulation: With patient ?Time For Goal Achievement: 06/10/21 ?Potential to Achieve Goals: Good  ?OT Frequency: Min 3X/week ?  ? ?Co-evaluation   ?  ?  ?  ?  ? ?  ?AM-PAC OT "6 Clicks" Daily Activity     ?Outcome Measure Help from another person eating meals?: None ?Help from another person taking care of personal grooming?: None ?Help from another person toileting, which includes using toliet, bedpan,  or urinal?: A Little ?Help from another person bathing (including washing, rinsing, drying)?: A Little ?Help from another person to put on and taking off regular upper body clothing?: A Little ?Help from another person

## 2021-05-27 NOTE — Progress Notes (Signed)
Physical Therapy Treatment ?Patient Details ?Name: [name] ?MRN: [id number] ?DOB: 22-Jan-1960 ?Today's Date: 05/27/2021 ? ? ?History of Present Illness Pt is a 61 y.o. male admitted 5/3 with osteomyelitis R foot. He underwent R transmet amputation 5/5. Returned to OR 5/7 for I&D and wound closure. PMH: IDDM, HTN, h/o PLIF (2021), h/o R toe amputations ? ?  ?PT Comments  ? ? Pt demonstrates independence with bed mobility. Mod assist sit to stand with RW, and min guard assist SPT. Pt demonstrates good ability to maintain NWB RLE during pivot transfers. Pt now reporting he had tried using a knee scooter following previous surgery without good results. He had difficulty maneuver the knee scooter and it tipped over causing him to fall. Pt will primarily be home alone at d/c; therefore, home equipment recommendations updated to wheelchair and BSC. His bathroom is not wheelchair accessible. Pt understands that he will require urinal/BSC and sponges baths until able to progress RW amb with HHPT. ? ?   ?Recommendations for follow up therapy are one component of a multi-disciplinary discharge planning process, led by the attending physician.  Recommendations may be updated based on patient status, additional functional criteria and insurance authorization. ? ?Follow Up Recommendations ? Home health PT ?  ?  ?Assistance Recommended at Discharge PRN  ?Patient can return home with the following Assistance with cooking/housework;Assist for transportation ?  ?Equipment Recommendations ? Wheelchair (measurements PT);Wheelchair cushion (measurements PT);BSC/3in1 (see OT note for measurements)  ?  ?Recommendations for Other Services   ? ? ?  ?Precautions / Restrictions Precautions ?Precautions: Fall ?Required Braces or Orthoses: Splint/Cast ?Splint/Cast: R distal LE splint/dressing placed in OR ?Splint/Cast - Date Prophylactic Dressing Applied (if applicable): 21/22/48 ?Restrictions ?Weight Bearing Restrictions: Yes ?RLE Weight  Bearing: Non weight bearing  ?  ? ?Mobility ? Bed Mobility ?Overal bed mobility: Independent ?  ?  ?  ?  ?  ?  ?  ?  ? ?Transfers ?Overall transfer level: Needs assistance ?Equipment used: None ?Transfers: Bed to chair/wheelchair/BSC ?  ?  ?  ?Squat pivot transfers: Min guard ?  ?  ?General transfer comment: Pt demo safe technique. ?  ? ?Ambulation/Gait ?  ?  ?  ?  ?  ?  ?  ?  ? ? ?Stairs ?  ?  ?  ?  ?  ? ? ?Wheelchair Mobility ?  ? ?Modified Rankin (Stroke Patients Only) ?  ? ? ?  ?Balance Overall balance assessment: Needs assistance ?Sitting-balance support: No upper extremity supported, Feet supported ?Sitting balance-Leahy Scale: Good ?  ?  ?Standing balance support: Bilateral upper extremity supported, Reliant on assistive device for balance, During functional activity ?Standing balance-Leahy Scale: Poor ?  ?  ?  ?  ?  ?  ?  ?  ?  ?  ?  ?  ?  ? ?  ?Cognition Arousal/Alertness: Awake/alert ?Behavior During Therapy: [facility] for tasks assessed/performed ?Overall Cognitive Status: Within Functional Limits for tasks assessed ?  ?  ?  ?  ?  ?  ?  ?  ?  ?  ?  ?  ?  ?  ?  ?  ?  ?  ?  ? ?  ?Exercises   ? ?  ?General Comments General comments (skin integrity, edema, etc.): R LE dressing Dry and sustained NWB with w/c transfer. pt with risk for weight bearing with RW ?  ?  ? ?Pertinent Vitals/Pain Pain Assessment ?Pain Assessment: 0-10 ?Pain Score: 10-Worst pain ever ?  Pain Location: R distal LE ?Pain Descriptors / Indicators: Grimacing, Discomfort, Operative site guarding, Throbbing ?Pain Intervention(s): Monitored during session, Limited activity within patient's tolerance, Repositioned, RN gave pain meds during session  ? ? ?Home Living Family/patient expects to be discharged to:: Private residence ?Living Arrangements: Alone ?Available Help at Discharge: Family;Available PRN/intermittently ?Type of Home: House ?Home Access: Level entry ?  ?  ?  ?Home Layout: One level ?Home Equipment: Conservation officer, nature (2 wheels);Cane -  single point ?Additional Comments: reports having chickens in a pin that a neighbor can help feed/ clean up after. Daughter checks on him daily and works as a Quarry manager.  ?  ?Prior Function    ?  ?  ?   ? ?PT Goals (current goals can now be found in the care plan section) Acute Rehab PT Goals ?Patient Stated Goal: home ?Progress towards PT goals: Progressing toward goals ? ?  ?Frequency ? ? ? Min 3X/week ? ? ? ?  ?PT Plan Equipment recommendations need to be updated  ? ? ?Co-evaluation   ?  ?  ?  ?  ? ?  ?AM-PAC PT "6 Clicks" Mobility   ?Outcome Measure ? Help needed turning from your back to your side while in a flat bed without using bedrails?: None ?Help needed moving from lying on your back to sitting on the side of a flat bed without using bedrails?: None ?Help needed moving to and from a bed to a chair (including a wheelchair)?: A Little ?Help needed standing up from a chair using your arms (e.g., wheelchair or bedside chair)?: A Little ?Help needed to walk in hospital room?: A Lot ?Help needed climbing 3-5 steps with a railing? : Total ?6 Click Score: 17 ? ?  ?End of Session Equipment Utilized During Treatment: Gait belt ?Activity Tolerance: Patient tolerated treatment well ?Patient left: in chair;with call bell/phone within reach ?Nurse Communication: Mobility status ?PT Visit Diagnosis: Unsteadiness on feet (R26.81);Other abnormalities of gait and mobility (R26.89);Pain ?Pain - Right/Left: Right ?Pain - part of body: Ankle and joints of foot ?  ? ? ?Time: 4268-3419 ?PT Time Calculation (min) (ACUTE ONLY): 13 min ? ?Charges:  $Therapeutic Activity: 8-22 mins          ?          ? ?Lorrin Goodell, PT  ?Office # [phone] ?Pager [phone] ? ? ? ?[name] ?05/27/2021, 10:13 AM ? ?

## 2021-05-27 NOTE — Progress Notes (Signed)
Patient discharged home in stable condition. Verbalizes understanding of all discharge instructions, including home medications and follow up appointments. 

## 2021-05-27 NOTE — TOC CM/SW Note (Signed)
?  ?  Durable Medical Equipment  ?(From admission, onward)  ?  ? ? ?  ? ?  Start     Ordered  ? 05/27/21 1020  For home use only DME 3 n 1  Once       ? 05/27/21 1020  ? 05/27/21 1019  For home use only DME standard manual wheelchair with seat cushion  Once       ?Comments: Patient suffers from osteomyelitis right foot  which impairs their ability to perform daily activities like ambulating  in the home.  A cane  will not resolve issue with performing activities of daily living. A wheelchair will allow patient to safely perform daily activities. Patient can safely propel the wheelchair in the home or has a caregiver who can provide assistance. Length of need Lifetime. ?Accessories: elevating leg rests (ELRs), wheel locks, extensions and anti-tippers. ? ? ?Seat and back cushions  ? ? ?Pt hip to hip ( width 19 inches), Hip to knee measures 18 inches. Based on measurements recommendations would be 21 width by 16 seat deep. Pt is requesting a very narrow chair so 20 by 16 would be the most narrow possible fit with 1 inch width allowance.  ? 05/27/21 1020  ? 05/26/21 1610  For home use only DME Other see comment  Once       ?Comments: Knee scooter for right lower extremity  ?Question:  Length of Need  Answer:  6 Months  ? 05/26/21 1609  ? ?  ?  ? ?  ?  ?

## 2021-05-27 NOTE — TOC Initial Note (Addendum)
Transition of Care (TOC) - Initial/Assessment Note  ? ? ?Patient Details  ?Name: [name] ?MRN: [id number] ?Date of Birth: [birth year] ? ?Transition of Care (TOC) CM/SW Contact:    ?Marilu Favre, RN ?Phone Number: ?05/27/2021, 10:22 AM ? ?Clinical Narrative:                 ?Spoke to patient at bedside . Discussed wheel chair , and bedside commode. Patient would like small wheel chair . NCM explained insurance goes by height and weight , but NCM will include request and therapy measurements in order. Patient voiced understanding. No preference for home health referral given to [facility] with [place].  ? ? ?Called Jasmine with [employer] for wheel chair and 3 in 1.  ? ?Patient does not want knee scooter, if he changes his mind he can borrow one from a friend  ?Expected Discharge Plan: [place] ?Barriers to Discharge: Continued Medical Work up ? ? ?Patient Goals and CMS Choice ?Patient states their goals for this hospitalization and ongoing recovery are:: to return to home ?CMS Medicare.gov Compare Post Acute Care list provided to:: Patient ?Choice offered to / list presented to : Patient ? ?Expected Discharge Plan and Services ?Expected Discharge Plan: [place] ?  ?Discharge Planning Services: CM Consult ?Post Acute Care Choice: Home Health ?Living arrangements for the past 2 months: [place] ?                ?DME Arranged: 3-N-1, Wheelchair manual ?DME Agency: [facility] ?  ?  ?  ?HH Arranged: PT, OT ?[place] Agency: [place] ?Date HH Agency Contacted: 05/27/21 ?Time [place]: 9371 ?Representative spoke with at [place]: Tommi Rumps ? ?Prior Living Arrangements/Services ?Living arrangements for the past 2 months: [place] ?Lives with:: Relatives ?Patient language and need for interpreter reviewed:: Yes ?Do you feel safe going back to the place where you live?: Yes      ?Need for Family Participation in Patient Care: Yes (Comment) ?Care giver  support system in place?: Yes (comment) ?Current home services: DME ?Criminal Activity/Legal Involvement Pertinent to Current Situation/Hospitalization: No - Comment as needed ? ?Activities of Daily Living ?Home Assistive Devices/Equipment: Cane (specify quad or straight), CBG Meter, Eyeglasses ?ADL Screening (condition at time of admission) ?Patient's cognitive ability adequate to safely complete daily activities?: Yes ?Is the patient deaf or have difficulty hearing?: No ?Does the patient have difficulty seeing, even when wearing glasses/contacts?: No ?Does the patient have difficulty concentrating, remembering, or making decisions?: No ?Patient able to express need for assistance with ADLs?: Yes ?Does the patient have difficulty dressing or bathing?: No ?Independently performs ADLs?: Yes (appropriate for developmental age) ?Does the patient have difficulty walking or climbing stairs?: Yes ?Weakness of Legs: Both ?Weakness of Arms/Hands: None ? ?Permission Sought/Granted ?  ?Permission granted to share information with : No ?   ?   ?   ?   ? ?Emotional Assessment ?Appearance:: Appears stated age ?Attitude/Demeanor/Rapport: Engaged ?Affect (typically observed): Accepting ?Orientation: : Oriented to Self, Oriented to Place, Oriented to  Time, Oriented to Situation ?Alcohol / Substance Use: Not Applicable ?Psych Involvement: No (comment) ? ?Admission diagnosis:  Osteomyelitis ([place]) [M86.9] ?Osteomyelitis of right foot, unspecified type ([place]) [M86.9] ?Patient Active Problem List  ? Diagnosis Date Noted  ? Abnormal surgical wound   ? Deformity of metatarsal bone of right foot   ? Open wound of right foot   ? Acute osteomyelitis  of right foot ([place]) 05/22/2021  ? AKI (acute kidney injury) ([place]) 05/22/2021  ? Anxiety 05/22/2021  ? 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 Diabetic foot infection ([place]) 08/27/2020  ? Spondylolisthesis of lumbar region 10/12/2019  ? Osteomyelitis due to type 2 diabetes mellitus  ([place]) 06/16/2019  ? Osteomyelitis of fourth toe of right foot ([place]) 06/16/2019  ? Acute biliary pancreatitis 10/23/2013  ? Biliary colic 07/86/7544  ? Abdominal pain, right upper quadrant 10/21/2013  ? Esophageal ulcer without bleeding 08/26/2010  ? HTN (hypertension) 08/26/2010  ? Controlled IDDM-2 with hyperglycemia 08/25/2010  ? Chronic pain 08/25/2010  ? ?PCP:  [name], MD ?Pharmacy:   ?[facility] [place], [place] [facility] [place] ?[address] ?[place] [address] ?Phone: [phone] Fax: [phone] ? ? ? ? ?Social Determinants of Health (SDOH) Interventions ?  ? ?Readmission Risk Interventions ?   ? View : No data to display.  ?  ?  ?  ? ? ? ?

## 2021-05-27 NOTE — Discharge Summary (Signed)
Physician Discharge Summary  ?[name] WSF:[id number] DOB: August 26, 1960 DOA: 05/22/2021 ? ?PCP: Jerry School, MD ? ?Admit date: 05/22/2021 ?Discharge date: 05/27/2021 ? ?Admitted From: Home ?Disposition:  Home ? ?Discharge Condition:Stable ?CODE STATUS:FULL ?Diet recommendation:  Carb Modified   ? ?Brief/Interim Summary: ?61 year old M with PMH of IDDM-2, HTN, osteomyelitis s/p multiple right toe amputations directed to ED by podiatry due to right forefoot infection and osteomyelitis after he failed conservative care with oral antibiotics outpatient for months.  Started on vancomycin, ceftriaxone and Flagyl.  MRI concerning for osteomyelitis in the first metatarsal stump and second and third metatarsal bones as well as second second phalanxes with surrounding soft tissue edema.  He underwent right TMT amputation with wound VAC placement by Dr. Sherryle Reilly on 5/5.  Blood cultures NGTD.Marland Kitchen  Returned to the OR and had partial resection of metatarsal x4 and washout and secondary wound closure of open surgical wound on 5/7.  Wound cultures showed E. coli and Enterococcus faecalis.  Antibiotics changed to oral.  He is medically stable to discharge to home with oral antibiotics.  He will follow-up with his podiatrist in a week ? ? ?Following problems were addressed during his hospitalization: ? ? ?Acute osteomyelitis of right foot ([place]) ?History of amputation of the 1st, 3rd and 4th toes of right foot by general surgery Dr. Arnoldo Reilly.  Failed outpatient treatment with oral antibiotics.  MRI concerning for osteomyelitis in right first metatarsal stump, second and third metatarsal bone and second phalanxes.  ABI 1.52 and 1.48 in right PTA and DP respectively with triphasic waveform suggesting noncompressible vessels. ?-S/p right TMT amputation and wound VAC placement by Dr. Sherryle Reilly on 5/5. ?-S/p partial resection of right metatarsal x4, washout and secondary wound closure ?-  Tissue culture with E. coli and Enterococcus  faecalis.   ?-Podiatry recommended oral antibiotics on discharge for additional 1 week based on sensitivity ?-NWB in a splint on right foot. ?-F/U in  a week with podiatry ?  ?AKI (acute kidney injury) ([place]) ?Resolved ?  ?Controlled IDDM-2 with hyperglycemia ?A1c 6.7%.  Home regimen Basaglar 45 units twice daily, metformin and Januvia. ?-Continue gabapentin and Lipitor. ?  ?Anxiety ?Continue Wellbutrin and Xanax ?  ?HTN (hypertension) ?Normotensive.   ?-Continue amlodipine, diltiazem ?-Also on losartan and Lasix   ?  ? ?Discharge Diagnoses:  ?Principal Problem: ?  Acute osteomyelitis of right foot ([place]) ?Active Problems: ?  AKI (acute kidney injury) ([place]) ?  Controlled IDDM-2 with hyperglycemia ?  HTN (hypertension) ?  Anxiety ?  Open wound of right foot ?  Abnormal surgical wound ?  Deformity of metatarsal bone of right foot ? ? ? ?Discharge Instructions ? ?Discharge Instructions   ? ? Diet Carb Modified   Complete by: As directed ?  ? Discharge instructions   Complete by: As directed ?  ? 1)Please take prescribed medications as instructed ?2)Follow up with your podiatrist Dr [name]  in a week  ? Increase activity slowly   Complete by: As directed ?  ? No wound care   Complete by: As directed ?  ? ?  ? ?Allergies as of 05/27/2021   ? ?   Reactions  ? Latex Rash, Other (See Comments)  ? Redness   ? ?  ? ?  ?Medication List  ?  ? ?TAKE these medications   ? ?Aimovig 140 MG/ML Soaj ?Generic drug: Erenumab-aooe ?Inject 140 mg into the skin every 30 (thirty) days. ?  ?ALPRAZolam 0.5 MG tablet ?Commonly known as: [name] ?  Take 0.5 mg by mouth 3 (three) times daily. ?  ?amLODipine 5 MG tablet ?Commonly known as: NORVASC ?Take 5 mg by mouth daily. ?  ?amoxicillin-clavulanate 875-125 MG tablet ?Commonly known as: AUGMENTIN ?Take 1 tablet by mouth every 12 (twelve) hours for 7 days. ?  ?aspirin EC 81 MG tablet ?Take 1 tablet (81 mg total) by mouth daily with breakfast. ?  ?aspirin-acetaminophen-caffeine 250-250-65 MG  tablet ?Commonly known as: [place] ?Take 1 tablet by mouth every 6 (six) hours as needed for headache. ?  ?atorvastatin 20 MG tablet ?Commonly known as: LIPITOR ?Take 20 mg by mouth daily. ?  ?Basaglar KwikPen 100 UNIT/ML ?Inject 45 Units into the skin 2 (two) times daily. ?  ?buPROPion 300 MG 24 hr tablet ?Commonly known as: WELLBUTRIN XL ?Take 300 mg by mouth daily. ?  ?ciprofloxacin 500 MG tablet ?Commonly known as: CIPRO ?Take 1 tablet (500 mg total) by mouth 2 (two) times daily for 7 days. ?  ?diclofenac Sodium 1 % Gel ?Commonly known as: VOLTAREN ?Apply 2 g topically daily as needed (pain). ?  ?diltiazem 420 MG 24 hr capsule ?Commonly known as: TIAZAC ?Take 420 mg by mouth daily. ?  ?DULoxetine 60 MG capsule ?Commonly known as: CYMBALTA ?Take 60 mg by mouth daily. ?  ?furosemide 20 MG tablet ?Commonly known as: LASIX ?Take 20 mg by mouth daily. ?  ?gabapentin 800 MG tablet ?Commonly known as: NEURONTIN ?Take 800 mg by mouth 3 (three) times daily. ?  ?insulin aspart 100 UNIT/ML FlexPen ?Commonly known as: NOVOLOG ?Inject 0-10 Units into the skin 3 (three) times daily with meals. insulin aspart (novoLOG) injection 0-10 Units 0-10 Units Subcutaneous, 3 times daily with meals CBG < 70: Implement Hypoglycemia Standing Orders and refer to Hypoglycemia Standing Orders sidebar report  CBG 70 - 120: 0 unit CBG 121 - 150: 0 unit  CBG 151 - 200: 1 unit CBG 201 - 250: 2 units CBG 251 - 300: 4 units CBG 301 - 350: 6 units  CBG 351 - 400: 8 units  CBG > 400: 10 units ?  ?losartan 100 MG tablet ?Commonly known as: COZAAR ?Take 100 mg by mouth daily. ?  ?metFORMIN 500 MG tablet ?Commonly known as: GLUCOPHAGE ?Take 1,000 mg by mouth in the morning and at bedtime. In the morning & 1300 ?  ?multivitamin with minerals Tabs tablet ?Take 1 tablet by mouth daily. ?  ?naproxen 500 MG tablet ?Commonly known as: NAPROSYN ?Take 500 mg by mouth 2 (two) times daily as needed for moderate pain. ?  ?ondansetron 4 MG  tablet ?Commonly known as: ZOFRAN ?Take 4 mg by mouth every 8 (eight) hours as needed for nausea or vomiting. ?  ?oxyCODONE-acetaminophen 10-325 MG tablet ?Commonly known as: PERCOCET ?Take 1 tablet by mouth 4 (four) times daily as needed for pain. ?  ?pantoprazole 40 MG tablet ?Commonly known as: PROTONIX ?TAKE 1 TABLET(40 MG) BY MOUTH DAILY ?What changed: See the new instructions. ?  ?sitaGLIPtin 100 MG tablet ?Commonly known as: JANUVIA ?Take 100 mg by mouth daily. ?  ?sulfamethoxazole-trimethoprim 800-160 MG tablet ?Commonly known as: BACTRIM DS ?Take 1 tablet by mouth 2 (two) times daily. ?What changed: additional instructions ?  ?tiZANidine 4 MG tablet ?Commonly known as: ZANAFLEX ?Take 1 tablet (4 mg total) by mouth every 8 (eight) hours as needed for muscle spasms. ?  ? ?  ? ?  ?  ? ? ?  ?Durable Medical Equipment  ?(From admission, onward)  ?  ? ? ?  ? ?  Start     Ordered  ? 05/27/21 1020  For home use only DME 3 n 1  Once       ? 05/27/21 1020  ? 05/27/21 1019  For home use only DME standard manual wheelchair with seat cushion  Once       ?Comments: Patient suffers from osteomyelitis right foot  which impairs their ability to perform daily activities like ambulating  in the home.  A cane  will not resolve issue with performing activities of daily living. A wheelchair will allow patient to safely perform daily activities. Patient can safely propel the wheelchair in the home or has a caregiver who can provide assistance. Length of need Lifetime. ?Accessories: elevating leg rests (ELRs), wheel locks, extensions and anti-tippers. ? ? ?Seat and back cushions  ? ? ?Pt hip to hip ( width 19 inches), Hip to knee measures 18 inches. Based on measurements recommendations would be 21 width by 16 seat deep. Pt is requesting a very narrow chair so 20 by 16 would be the most narrow possible fit with 1 inch width allowance.  ? 05/27/21 1020  ? 05/26/21 1610  For home use only DME Other see comment  Once       ?Comments:  Knee scooter for right lower extremity  ?Question:  Length of Need  Answer:  6 Months  ? 05/26/21 1609  ? ?  ?  ? ?  ? ? Follow-up Information   ? ? Care, [facility] Follow up.   ?Specialty: Home Health Services ?Contact

## 2021-05-28 ENCOUNTER — Encounter: Payer: Medicare Other | Admitting: General Surgery

## 2021-05-28 LAB — VITAMIN A: Vitamin A (Retinoic Acid): 30.3 ug/dL (ref 20.1–62.0)

## 2021-05-28 LAB — SURGICAL PATHOLOGY

## 2021-05-29 ENCOUNTER — Telehealth: Payer: Self-pay | Admitting: *Deleted

## 2021-05-29 LAB — VITAMIN C: Vitamin C: 0.3 mg/dL — ABNORMAL LOW (ref 0.4–2.0)

## 2021-05-29 LAB — ZINC: Zinc: 49 ug/dL (ref 44–115)

## 2021-05-29 NOTE — Telephone Encounter (Signed)
Called Shawn and gave approval for verbal orders and will something in writing for physician's signature. ? ?He also wanted to mentioned that patient was in quite a bit of depression, moments in tears,possibly a referral for consultation to talk with someone. ?

## 2021-05-29 NOTE — Telephone Encounter (Signed)
Yes that is fine if you can let her know. They can fax me the orders to sign

## 2021-05-29 NOTE — Telephone Encounter (Addendum)
Consuelo Pandy PT,is requesting verbal orders for 1 times a week for 1 week, 2 weeks, 1 times a week for 5 weeks.  ?He had his first visit today focusing on lower extremity, standing , balancing , able to stand to sink w/ weightbearing on left foot, will continue until  able to put weight on right. ?He is also asking for skilled nursing evaluation for the  week of 06/03/21. ?He also wanted to mentioned that patient was in quite a bit of depression, moments in tears,possibly a referral for consultation to talk with someone. ?

## 2021-06-05 ENCOUNTER — Telehealth: Payer: Self-pay | Admitting: *Deleted

## 2021-06-05 NOTE — Telephone Encounter (Signed)
Patient has appointment with Dr. Sherryle Lis tomorrow, 06/06/2021.  He can adjust wound care orders then.  Thanks, Dr. Amalia Hailey

## 2021-06-05 NOTE — Telephone Encounter (Signed)
[name] w/[facility] is calling for dressing change orders after tomorrow's appointment, please send or call for verbal [phone] ?

## 2021-06-06 ENCOUNTER — Ambulatory Visit (INDEPENDENT_AMBULATORY_CARE_PROVIDER_SITE_OTHER): Payer: Medicare Other | Admitting: Podiatry

## 2021-06-06 DIAGNOSIS — Z89431 Acquired absence of right foot: Secondary | ICD-10-CM

## 2021-06-06 NOTE — Telephone Encounter (Signed)
[name] [phone]) is wanting dressing change orders sent to her after patient's visit today. May fax to [phone].

## 2021-06-07 NOTE — Telephone Encounter (Signed)
Please advise 

## 2021-06-07 NOTE — Telephone Encounter (Signed)
Patient called he wants another home health company for physical therapy to come out,  they are just showing up whenever they want to 2-3 times a day , Please call

## 2021-06-10 ENCOUNTER — Telehealth: Payer: Self-pay | Admitting: *Deleted

## 2021-06-10 NOTE — Telephone Encounter (Signed)
[facility] home health requesting verbal orders for wound care.   Called and and gave verbal orders(epic notes) for dressing changes, faxed the orders as well to office. Attn: Sarah.confirmation received 06/10/21.

## 2021-06-10 NOTE — Progress Notes (Signed)
  Subjective:  Patient ID: [name], male    DOB: 24-Oct-1960,  MRN: [id number]  Chief Complaint  Patient presents with   Routine Post Op    POV 1 DOS 05/25/2021     DOS: 05/25/2021 Procedure: Right foot TMA and TAL  61 y.o. male returns for post-op check.  Doing okay.  He is still trying to come to grips with the amputation  Review of Systems: Negative except as noted in the HPI. Denies N/V/F/Ch.   Objective:  There were no vitals filed for this visit. There is no height or weight on file to calculate BMI. Constitutional Well developed. Well nourished.  Vascular Foot warm and well perfused. Capillary refill normal to skin flaps.  Calf is soft and supple, no posterior calf or knee pain, negative Homans' sign  Neurologic Normal speech. Oriented to person, place, and time. Epicritic sensation to light touch grossly present bilaterally.  Dermatologic Skin healing well without signs of infection. Skin edges well coapted without signs of infection.  Orthopedic: Tenderness to palpation noted about the surgical site.    Assessment:   1. Status post transmetatarsal amputation of foot, right ([place])    Plan:  Patient was evaluated and treated and all questions answered.  S/p foot surgery right -Progressing as expected post-operatively. -Home nursing orders were given -WB Status: NWB in splint -Sutures: Plan remove in 2 weeks. -Medications: No refills required -Foot redressed.  Well-padded below the knee splint and sterile dressings was applied  Return in about 2 weeks (around 06/20/2021) for post op (no x-rays), suture and staple removal.

## 2021-06-10 NOTE — Telephone Encounter (Signed)
Called patient and he verbalized understanding and said ok.

## 2021-06-18 ENCOUNTER — Telehealth: Payer: Self-pay | Admitting: *Deleted

## 2021-06-18 NOTE — Telephone Encounter (Signed)
I will reassess and decide on Thursday when I see him

## 2021-06-18 NOTE — Telephone Encounter (Signed)
[name] is calling for instructions on if the patient can put weight on his right heel using his walker in the house. Please advise.

## 2021-06-18 NOTE — Telephone Encounter (Signed)
Called and left message of physician's instructions to reassess on upcoming appointment  on Thursday.

## 2021-06-20 ENCOUNTER — Ambulatory Visit: Payer: Medicare Other

## 2021-06-20 ENCOUNTER — Ambulatory Visit (INDEPENDENT_AMBULATORY_CARE_PROVIDER_SITE_OTHER): Payer: Medicare Other | Admitting: Podiatry

## 2021-06-20 DIAGNOSIS — Z89431 Acquired absence of right foot: Secondary | ICD-10-CM

## 2021-06-20 DIAGNOSIS — Z794 Long term (current) use of insulin: Secondary | ICD-10-CM

## 2021-06-20 DIAGNOSIS — M869 Osteomyelitis, unspecified: Secondary | ICD-10-CM

## 2021-06-20 DIAGNOSIS — M21961 Unspecified acquired deformity of right lower leg: Secondary | ICD-10-CM

## 2021-06-20 DIAGNOSIS — E1165 Type 2 diabetes mellitus with hyperglycemia: Secondary | ICD-10-CM

## 2021-06-20 DIAGNOSIS — E1142 Type 2 diabetes mellitus with diabetic polyneuropathy: Secondary | ICD-10-CM

## 2021-06-20 NOTE — Progress Notes (Signed)
  Subjective:  Patient ID: [name], male    DOB: [birth year],  MRN: [id number]  Chief Complaint  Patient presents with   Routine Post Op    suture and staple removal right foot    DOS: 05/25/2021 Procedure: Right foot TMA and TAL  61 y.o. male returns for post-op check.  Doing much better he has been able to look at it and is coming around to the idea of not having toes anymore  Review of Systems: Negative except as noted in the HPI. Denies N/V/F/Ch.   Objective:  There were no vitals filed for this visit. There is no height or weight on file to calculate BMI. Constitutional Well developed. Well nourished.  Vascular Foot warm and well perfused. Capillary refill normal to skin flaps.  Calf is soft and supple, no posterior calf or knee pain, negative Homans' sign  Neurologic Normal speech. Oriented to person, place, and time. Epicritic sensation to light touch grossly present bilaterally.  Dermatologic Skin healing well without signs of infection. Skin edges well coapted without signs of infection.  Orthopedic: He has no tenderness to palpation around the surgical sites    Assessment:   1. Status post transmetatarsal amputation of foot, right ([place])   2. Type 2 diabetes mellitus with hyperglycemia, with long-term current use of insulin (HCC)   3. Acquired foot deformity, right   4. Diabetic polyneuropathy associated with type 2 diabetes mellitus ([place])    Plan:  Patient was evaluated and treated and all questions answered.  S/p foot surgery right -Overall doing very well.  All sutures and staples were removed today.  He may resume his regular bathing and apply lotion to the incisions.  No further dressing changes required and I wrote an order to discontinue home health for nursing.  They should continue home physical therapy and he may begin WBAT in a short cam boot which was dispensed today.  -He was also seen by our orthotist today for fitting for extra-depth diabetic  shoes with multidensity insoles and an amputation filler for the right foot.  This will be imperative to reducing his risk of further ulceration and amputation  Return in about 4 weeks (around 07/18/2021).

## 2021-06-20 NOTE — Progress Notes (Signed)
SITUATION Reason for Consult: Evaluation for Prefabricated Diabetic Shoes and Custom Diabetic Inserts. Patient / Caregiver Report: Patient would like well fitting shoes  OBJECTIVE DATA: Patient History / Diagnosis:    ICD-10-CM   1. 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 Status post transmetatarsal amputation of foot, right [facility])  Z89.431       Physician Treating Diabetes:  [name], MD  Current or Previous Devices:   None and no history  In-Person Foot Examination: Ulcers & Callousing:   Historical Deformities:    Right TMA Sensation:    Compromised  Shoe Size:     9.5W  ORTHOTIC RECOMMENDATION Recommended Devices: - 1x pair prefabricated PDAC approved diabetic shoes; Patient Selected Apex A4100M Size 9.5W - 3x pair custom-to-patient PDAC approved vacuum formed diabetic insoles.  GOALS OF SHOES AND INSOLES - Reduce shear and pressure - Reduce / Prevent callus formation - Reduce / Prevent ulceration - Protect the fragile healing compromised diabetic foot.  Patient would benefit from diabetic shoes and inserts as patient has diabetes mellitus and the patient has one or more of the following conditions: - History of partial or complete amputation of the foot - History of previous foot ulceration. - History of pre-ulcerative callus - Peripheral neuropathy with evidence of callus formation - Foot deformity - Poor circulation  ACTIONS PERFORMED Potential out of pocket cost was communicated to patient. Patient understood and consented to measurement and casting. Patient was casted for insoles via crush box and measured for shoes via brannock device. Procedure was explained and patient tolerated procedure well. All questions were answered and concerns addressed. Casts were shipped to central fabrication for HOLD until Certificate of Medical Necessity or otherwise necessary authorization from insurance is obtained.  PLAN Shoes are to  be ordered and casts released from hold once all appropriate paperwork is complete. Patient is to be contacted and scheduled for fitting once shoes and insoles have been fabricated and received.

## 2021-06-25 ENCOUNTER — Telehealth: Payer: Self-pay | Admitting: *Deleted

## 2021-06-25 NOTE — Telephone Encounter (Signed)
Patient is calling because his pump is not working which is causing his foot to keep slipping,making his heel red in the boot.  Instructed patient that he may bring the boot back to exchange. He will be in today or tomorrow.

## 2021-06-27 ENCOUNTER — Telehealth: Payer: Self-pay | Admitting: *Deleted

## 2021-06-27 NOTE — Telephone Encounter (Signed)
Patient is calling to ask if he is able to drive now, unable to get to office to pick up boot, no one else available to bring him. Please advise.

## 2021-06-29 ENCOUNTER — Ambulatory Visit (INDEPENDENT_AMBULATORY_CARE_PROVIDER_SITE_OTHER): Payer: Medicare Other | Admitting: Podiatry

## 2021-06-29 ENCOUNTER — Ambulatory Visit (INDEPENDENT_AMBULATORY_CARE_PROVIDER_SITE_OTHER): Payer: Medicare Other

## 2021-06-29 DIAGNOSIS — Z89431 Acquired absence of right foot: Secondary | ICD-10-CM

## 2021-07-03 ENCOUNTER — Encounter: Payer: Self-pay | Admitting: Podiatry

## 2021-07-03 NOTE — Progress Notes (Signed)
  Subjective:  Patient ID: [name], male    DOB: [birth year],  MRN: [id number]  Chief Complaint  Patient presents with   Post-op Problem    Right foot pain due to friction of cam boot.     DOS: 05/25/2021 Procedure: Right foot TMA and TAL  61 y.o. male returns for post-op check.  Came in early because he was worried about the boot rubbing on the foot and possibility of getting a new ulcer.  Overall otherwise doing well.  Review of Systems: Negative except as noted in the HPI. Denies N/V/F/Ch.   Objective:  There were no vitals filed for this visit. There is no height or weight on file to calculate BMI. Constitutional Well developed. Well nourished.  Vascular Foot warm and well perfused. Capillary refill normal to skin flaps.  Calf is soft and supple, no posterior calf or knee pain, negative Homans' sign  Neurologic Normal speech. Oriented to person, place, and time. Epicritic sensation to light touch grossly present bilaterally.  Dermatologic Incisions remain well-healed and not hypertrophic.  There is no new ulceration or blistering  Orthopedic: He has no tenderness to palpation around the surgical sites    Assessment:   1. Status post transmetatarsal amputation of foot, right ([place])    Plan:  Patient was evaluated and treated and all questions answered.  S/p foot surgery right -Thankfully no issues from the boot.  I dispensed a more appropriately fitted amputation boot which should work him well.  I will see him back at his scheduled visit.  No follow-ups on file.

## 2021-07-08 ENCOUNTER — Telehealth: Payer: Self-pay

## 2021-07-08 NOTE — Telephone Encounter (Signed)
Patient notified

## 2021-07-08 NOTE — Telephone Encounter (Signed)
Patient is calling back to ask if he may be able drive now.Please advise.

## 2021-07-08 NOTE — Telephone Encounter (Signed)
CMN Received - Shoes ordered and casts released from fabrication hold.  

## 2021-07-08 NOTE — Telephone Encounter (Signed)
Yes that is fine

## 2021-07-18 ENCOUNTER — Ambulatory Visit (INDEPENDENT_AMBULATORY_CARE_PROVIDER_SITE_OTHER): Payer: Medicare Other | Admitting: Podiatry

## 2021-07-18 DIAGNOSIS — Z89431 Acquired absence of right foot: Secondary | ICD-10-CM

## 2021-07-21 NOTE — Progress Notes (Signed)
  Subjective:  Patient ID: [name], male    DOB: [birth year],  MRN: [id number]  Follow-up of amputation  DOS: 05/25/2021 Procedure: Right foot TMA and TAL  61 y.o. male returns for post-op check.  Doing much better.  Continues to wear regular shoe gears and says his therapy has been well  Review of Systems: Negative except as noted in the HPI. Denies N/V/F/Ch.   Objective:  There were no vitals filed for this visit. There is no height or weight on file to calculate BMI. Constitutional Well developed. Well nourished.  Vascular Foot warm and well perfused. Capillary refill normal to skin flaps.  Calf is soft and supple, no posterior calf or knee pain, negative Homans' sign  Neurologic Normal speech. Oriented to person, place, and time. Epicritic sensation to light touch grossly present bilaterally.  Dermatologic Incisions remain well-healed and not hypertrophic.  There is no new ulceration or blistering no recurrence of ulceration  Orthopedic: He has no tenderness to palpation around the surgical sites    Assessment:   1. Status post transmetatarsal amputation of foot, right ([place])     Plan:  Patient was evaluated and treated and all questions answered.  S/p foot surgery right -Doing very well.  He may continue wearing his sugar.  We will let him know when his shoe filler and AFO is ready.  I will see him back as needed or in 1 year for his annual diabetic foot exam.  Advised on the risk of recurrence of ulceration or if he has issues with his other foot.  Return in about 1 year (around 07/19/2022) for follow up from amputation and diabetic exam.

## 2021-07-22 DIAGNOSIS — I700 Atherosclerosis of aorta: Secondary | ICD-10-CM | POA: Diagnosis not present

## 2021-07-22 DIAGNOSIS — I10 Essential (primary) hypertension: Secondary | ICD-10-CM | POA: Diagnosis not present

## 2021-07-22 DIAGNOSIS — G894 Chronic pain syndrome: Secondary | ICD-10-CM | POA: Diagnosis not present

## 2021-07-22 DIAGNOSIS — E1140 Type 2 diabetes mellitus with diabetic neuropathy, unspecified: Secondary | ICD-10-CM | POA: Diagnosis not present

## 2021-07-27 DIAGNOSIS — S91301A Unspecified open wound, right foot, initial encounter: Secondary | ICD-10-CM | POA: Diagnosis not present

## 2021-07-27 DIAGNOSIS — T819XXA Unspecified complication of procedure, initial encounter: Secondary | ICD-10-CM | POA: Diagnosis not present

## 2021-07-27 DIAGNOSIS — M21961 Unspecified acquired deformity of right lower leg: Secondary | ICD-10-CM | POA: Diagnosis not present

## 2021-07-31 DIAGNOSIS — M542 Cervicalgia: Secondary | ICD-10-CM | POA: Diagnosis not present

## 2021-07-31 DIAGNOSIS — E1140 Type 2 diabetes mellitus with diabetic neuropathy, unspecified: Secondary | ICD-10-CM | POA: Diagnosis not present

## 2021-07-31 DIAGNOSIS — M79606 Pain in leg, unspecified: Secondary | ICD-10-CM | POA: Diagnosis not present

## 2021-07-31 DIAGNOSIS — Z79891 Long term (current) use of opiate analgesic: Secondary | ICD-10-CM | POA: Diagnosis not present

## 2021-07-31 DIAGNOSIS — M545 Low back pain, unspecified: Secondary | ICD-10-CM | POA: Diagnosis not present

## 2021-08-01 ENCOUNTER — Ambulatory Visit (INDEPENDENT_AMBULATORY_CARE_PROVIDER_SITE_OTHER): Payer: Medicare Other | Admitting: Podiatry

## 2021-08-01 DIAGNOSIS — M2042 Other hammer toe(s) (acquired), left foot: Secondary | ICD-10-CM

## 2021-08-03 ENCOUNTER — Encounter: Payer: Self-pay | Admitting: Podiatry

## 2021-08-03 NOTE — Progress Notes (Signed)
  Subjective:  Patient ID: [name], male    DOB: 10-18-1960,  MRN: [id number]   Issue with toe on left foot  61 y.o. male returns for evaluation of a new issue of a problem the toe on the left foot  Review of Systems: Negative except as noted in the HPI. Denies N/V/F/Ch.   Objective:  There were no vitals filed for this visit. There is no height or weight on file to calculate BMI. Constitutional Well developed. Well nourished.  Vascular Foot warm and well perfused. Capillary refill normal to skin flaps.  Calf is soft and supple, no posterior calf or knee pain, negative Homans' sign  Neurologic Normal speech. Oriented to person, place, and time. Epicritic sensation to light touch grossly reduced bilaterally.  Dermatologic Callus at tip of left fourth toe  Orthopedic: He has no tenderness to palpation around the surgical sites    Assessment:   1. Hammertoe of left foot      Plan:  Patient was evaluated and treated and all questions answered.  Hammertoes left foot -Does have a digital contracture.  There is a callus at the tip of the fourth toe.  No signs of infection or ulceration currently.  We will have to watch closely.  I will see him back in 4 weeks for follow-up I dispensed a silicone pad to offload this.  May need to consider elective surgical intervention to the ulceration does not develop  Return in about 4 weeks (around 08/29/2021) for f/u on toe contracture (new xrays).

## 2021-08-05 DIAGNOSIS — M5416 Radiculopathy, lumbar region: Secondary | ICD-10-CM | POA: Diagnosis not present

## 2021-08-06 IMAGING — RF DG LUMBAR SPINE 2-3V
1 series · 2 of 2 positions shown · non-contrast
Comparison: None.

CLINICAL DATA: L3-4 posterior fusion

EXAM:
LUMBAR SPINE - 2-3 VIEW; DG C-ARM 1-60 MIN

[Series 1: run · 2 of 2 slices shown]
[im 1/2]
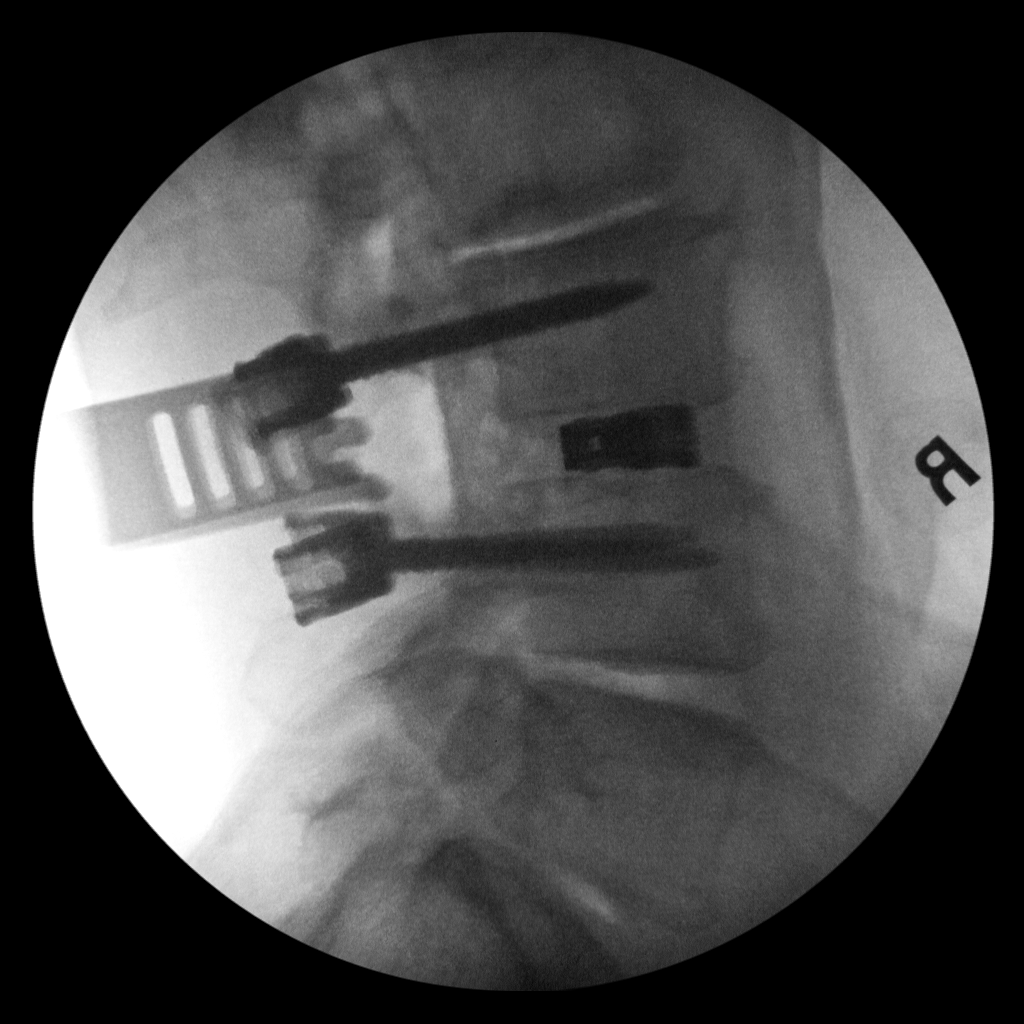
[im 2/2]
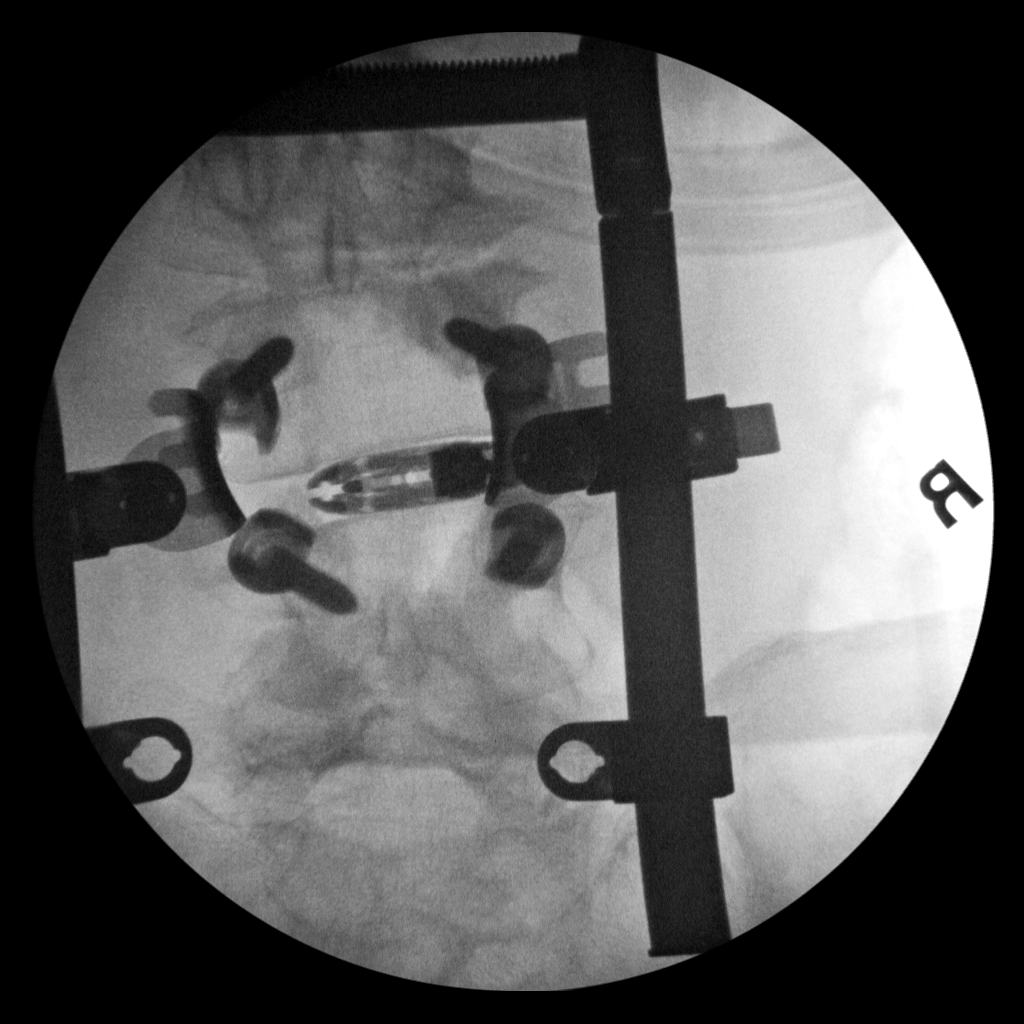

[2 of 2 positions shown; findings below may reference images not displayed]

FINDINGS: Intraoperative imaging demonstrates posterior fusion changes at
L3-4. No hardware complicating feature.
IMPRESSION: Intraoperative imaging as above.

## 2021-08-13 ENCOUNTER — Telehealth: Payer: Self-pay | Admitting: *Deleted

## 2021-08-20 ENCOUNTER — Telehealth: Payer: Self-pay

## 2021-08-20 NOTE — Telephone Encounter (Signed)
No additional notes needed  

## 2021-08-20 NOTE — Telephone Encounter (Signed)
Created in Error

## 2021-08-21 ENCOUNTER — Other Ambulatory Visit: Payer: Medicare Other

## 2021-08-21 ENCOUNTER — Telehealth: Payer: Self-pay | Admitting: *Deleted

## 2021-08-21 DIAGNOSIS — G894 Chronic pain syndrome: Secondary | ICD-10-CM | POA: Diagnosis not present

## 2021-08-21 DIAGNOSIS — E1129 Type 2 diabetes mellitus with other diabetic kidney complication: Secondary | ICD-10-CM | POA: Diagnosis not present

## 2021-08-21 DIAGNOSIS — I700 Atherosclerosis of aorta: Secondary | ICD-10-CM | POA: Diagnosis not present

## 2021-08-21 DIAGNOSIS — I10 Essential (primary) hypertension: Secondary | ICD-10-CM | POA: Diagnosis not present

## 2021-08-21 NOTE — Telephone Encounter (Signed)
Error message

## 2021-08-27 DIAGNOSIS — T819XXA Unspecified complication of procedure, initial encounter: Secondary | ICD-10-CM | POA: Diagnosis not present

## 2021-08-27 DIAGNOSIS — S91301A Unspecified open wound, right foot, initial encounter: Secondary | ICD-10-CM | POA: Diagnosis not present

## 2021-08-27 DIAGNOSIS — M21961 Unspecified acquired deformity of right lower leg: Secondary | ICD-10-CM | POA: Diagnosis not present

## 2021-08-29 ENCOUNTER — Other Ambulatory Visit: Payer: Medicare Other

## 2021-08-29 ENCOUNTER — Ambulatory Visit: Payer: Medicare Other | Admitting: Podiatry

## 2021-09-06 DIAGNOSIS — M4316 Spondylolisthesis, lumbar region: Secondary | ICD-10-CM | POA: Diagnosis not present

## 2021-09-16 NOTE — Telephone Encounter (Signed)
error 

## 2021-09-20 DIAGNOSIS — B353 Tinea pedis: Secondary | ICD-10-CM | POA: Diagnosis not present

## 2021-09-20 DIAGNOSIS — G894 Chronic pain syndrome: Secondary | ICD-10-CM | POA: Diagnosis not present

## 2021-09-20 DIAGNOSIS — E1142 Type 2 diabetes mellitus with diabetic polyneuropathy: Secondary | ICD-10-CM | POA: Diagnosis not present

## 2021-09-20 DIAGNOSIS — I10 Essential (primary) hypertension: Secondary | ICD-10-CM | POA: Diagnosis not present

## 2021-09-20 DIAGNOSIS — E08621 Diabetes mellitus due to underlying condition with foot ulcer: Secondary | ICD-10-CM | POA: Diagnosis not present

## 2021-09-24 DIAGNOSIS — M5416 Radiculopathy, lumbar region: Secondary | ICD-10-CM | POA: Diagnosis not present

## 2021-09-26 ENCOUNTER — Ambulatory Visit (INDEPENDENT_AMBULATORY_CARE_PROVIDER_SITE_OTHER): Payer: Medicare Other

## 2021-09-26 ENCOUNTER — Ambulatory Visit (INDEPENDENT_AMBULATORY_CARE_PROVIDER_SITE_OTHER): Payer: Medicare Other | Admitting: Podiatry

## 2021-09-26 ENCOUNTER — Ambulatory Visit: Payer: Medicare Other | Admitting: *Deleted

## 2021-09-26 DIAGNOSIS — Z794 Long term (current) use of insulin: Secondary | ICD-10-CM

## 2021-09-26 DIAGNOSIS — E1165 Type 2 diabetes mellitus with hyperglycemia: Secondary | ICD-10-CM

## 2021-09-26 DIAGNOSIS — M2042 Other hammer toe(s) (acquired), left foot: Secondary | ICD-10-CM

## 2021-09-26 DIAGNOSIS — Z89431 Acquired absence of right foot: Secondary | ICD-10-CM

## 2021-09-26 DIAGNOSIS — E1142 Type 2 diabetes mellitus with diabetic polyneuropathy: Secondary | ICD-10-CM

## 2021-09-26 NOTE — Progress Notes (Signed)
Patient presents today to pick up diabetic shoes and insoles.  Patient was dispensed 1 pair of diabetic shoes and 3 pairs of foam casted diabetic insoles for the left and 1 insole with forefoot filler on the right. He tried on the shoes with the insoles and the fit was not satisfactory.   He states the shoes felt too tight    I recommended sending the shoes back for a larger and wider size.   We will send back: Apex hiking boot Men's 9.5 Wide  Reorder: Apex hiking boot Men's size 10 X-Wide  Insoles were kept in the office to check fit with reordered shoes.  Patient will be contacted for a fitting appointment once the reordered shoes arrive in office.

## 2021-09-30 NOTE — Progress Notes (Signed)
  Subjective:  Patient ID: [name], male    DOB: [birth year],  MRN: [id number]   Issue with toe on left foot  62 y.o. male returns for follow-up of left fourth toe  Review of Systems: Negative except as noted in the HPI. Denies N/V/F/Ch.   Objective:  There were no vitals filed for this visit. There is no height or weight on file to calculate BMI. Constitutional Well developed. Well nourished.  Vascular Foot warm and well perfused. Capillary refill normal to skin flaps.  Calf is soft and supple, no posterior calf or knee pain, negative Homans' sign  Neurologic Normal speech. Oriented to person, place, and time. Epicritic sensation to light touch grossly reduced bilaterally.  Dermatologic Callus at tip of left fourth toe  Orthopedic: Fourth toe is contracted adducted at the proximal interphalangeal joint    Assessment:   1. Hammertoe of left foot   2. Type 2 diabetes mellitus with hyperglycemia, with long-term current use of insulin ([place])      Plan:  Patient was evaluated and treated and all questions answered.  Hammertoes left foot -Has not had much relief with the offloading silicone pads.  We discussed further correction of this deformity including surgical correction.  I think arthroplasty and/or realignment will require a longer recovery and offers no increased benefit compared to a partial digital amputation which he is amenable to.  I think the severity of the contracture and preulcerative callus would necessitate that partial amputation would be the best route forward.  We discussed the risk benefits and potential complications of this including but not limited to  pain, swelling, infection, scar, numbness which may be temporary or permanent, chronic pain, stiffness, nerve pain or damage, wound healing problems.  He understands and wishes to proceed.  Surgery will be scheduled mutually agreeable date.  No guarantees as the outcome of surgery were able to be  made.   No follow-ups on file.

## 2021-10-09 ENCOUNTER — Telehealth: Payer: Self-pay

## 2021-10-09 NOTE — Telephone Encounter (Signed)
DOS 11/04/2021  AMPUTATION IPJ 4TH LT - [id number]  [name] EFFECTIVE 08/20/2021  Notification or Prior Authorization is not required for the requested services  Decision ID #:[id number]

## 2021-10-21 DIAGNOSIS — I700 Atherosclerosis of aorta: Secondary | ICD-10-CM | POA: Diagnosis not present

## 2021-10-21 DIAGNOSIS — G894 Chronic pain syndrome: Secondary | ICD-10-CM | POA: Diagnosis not present

## 2021-10-21 DIAGNOSIS — I10 Essential (primary) hypertension: Secondary | ICD-10-CM | POA: Diagnosis not present

## 2021-10-21 DIAGNOSIS — E1129 Type 2 diabetes mellitus with other diabetic kidney complication: Secondary | ICD-10-CM | POA: Diagnosis not present

## 2021-11-11 ENCOUNTER — Encounter: Payer: Medicare Other | Admitting: Podiatry

## 2021-11-16 ENCOUNTER — Telehealth: Payer: Self-pay | Admitting: Podiatry

## 2021-11-16 NOTE — Telephone Encounter (Signed)
LVM for pt to call back for an appt for DM SHOE PICK UP  

## 2021-11-21 DIAGNOSIS — G894 Chronic pain syndrome: Secondary | ICD-10-CM | POA: Diagnosis not present

## 2021-11-21 DIAGNOSIS — I10 Essential (primary) hypertension: Secondary | ICD-10-CM | POA: Diagnosis not present

## 2021-11-21 DIAGNOSIS — E1142 Type 2 diabetes mellitus with diabetic polyneuropathy: Secondary | ICD-10-CM | POA: Diagnosis not present

## 2021-11-25 ENCOUNTER — Encounter: Payer: Medicare Other | Admitting: Podiatry

## 2021-12-10 ENCOUNTER — Encounter: Payer: Medicare Other | Admitting: Podiatry

## 2021-12-11 ENCOUNTER — Ambulatory Visit (INDEPENDENT_AMBULATORY_CARE_PROVIDER_SITE_OTHER): Payer: Medicare Other | Admitting: *Deleted

## 2021-12-11 DIAGNOSIS — E1142 Type 2 diabetes mellitus with diabetic polyneuropathy: Secondary | ICD-10-CM | POA: Diagnosis not present

## 2021-12-11 DIAGNOSIS — M2042 Other hammer toe(s) (acquired), left foot: Secondary | ICD-10-CM | POA: Diagnosis not present

## 2021-12-11 DIAGNOSIS — Z89431 Acquired absence of right foot: Secondary | ICD-10-CM | POA: Diagnosis not present

## 2021-12-11 NOTE — Progress Notes (Signed)
Patient presents today to pick up diabetic shoes and insoles.  Patient was dispensed 1 pair of diabetic shoes and 3 pairs of foam casted diabetic insoles for the left foot and 1 diabetic insoles with toe filler for the right. Fit was satisfactory. Instructions for break-in and wear was reviewed and a copy was given to the patient.   Re-appointment for regularly scheduled diabetic foot care visits or if they should experience any trouble with the shoes or insoles.

## 2021-12-17 ENCOUNTER — Encounter: Payer: Medicare Other | Admitting: Podiatry

## 2021-12-18 DIAGNOSIS — I10 Essential (primary) hypertension: Secondary | ICD-10-CM | POA: Diagnosis not present

## 2021-12-18 DIAGNOSIS — E1142 Type 2 diabetes mellitus with diabetic polyneuropathy: Secondary | ICD-10-CM | POA: Diagnosis not present

## 2021-12-18 DIAGNOSIS — M4306 Spondylolysis, lumbar region: Secondary | ICD-10-CM | POA: Diagnosis not present

## 2021-12-18 DIAGNOSIS — G894 Chronic pain syndrome: Secondary | ICD-10-CM | POA: Diagnosis not present

## 2021-12-24 ENCOUNTER — Telehealth: Payer: Self-pay | Admitting: Podiatry

## 2021-12-24 NOTE — Telephone Encounter (Addendum)
DOS: 01/24/2022  [employer] [place] Medicaid [place] Access  Amputation Toe Interphalangeal 4th Lt [id number])  Deductible: $240 with $0 met Out-of-Pocket: $8,850 with $0 met CoInsurance: 20%  Prior authorization is not required per Charlene Brooke T.  Call Reference #: [id number]  Notification or Prior Authorization is not required for the requested services  Decision ID #:[id number]  The number above acknowledges your inquiry and our response. Please write this number down and refer to it for future inquiries. Coverage and payment for an item or service is governed by the member's benefit plan document, and, if applicable, the provider's participation agreement with the Health Plan.

## 2021-12-25 DIAGNOSIS — M5416 Radiculopathy, lumbar region: Secondary | ICD-10-CM | POA: Diagnosis not present

## 2021-12-27 DIAGNOSIS — S91301A Unspecified open wound, right foot, initial encounter: Secondary | ICD-10-CM | POA: Diagnosis not present

## 2021-12-27 DIAGNOSIS — M21961 Unspecified acquired deformity of right lower leg: Secondary | ICD-10-CM | POA: Diagnosis not present

## 2021-12-27 DIAGNOSIS — T819XXA Unspecified complication of procedure, initial encounter: Secondary | ICD-10-CM | POA: Diagnosis not present

## 2022-01-16 ENCOUNTER — Telehealth: Payer: Self-pay | Admitting: Podiatry

## 2022-01-16 NOTE — Telephone Encounter (Signed)
Patient called this morning the straps broke off of his new diabetic shoes he has had them for like 3 weeks. He is going to swing by and drop them off at the office so that we can order him a new pair.

## 2022-01-24 ENCOUNTER — Telehealth: Payer: Self-pay | Admitting: *Deleted

## 2022-01-24 ENCOUNTER — Other Ambulatory Visit: Payer: Self-pay | Admitting: Podiatry

## 2022-01-24 DIAGNOSIS — M2042 Other hammer toe(s) (acquired), left foot: Secondary | ICD-10-CM | POA: Diagnosis not present

## 2022-01-24 MED ORDER — ACETAMINOPHEN 500 MG PO TABS: 1000.0000 mg | ORAL_TABLET | Freq: Four times a day (QID) | ORAL | 0 refills | Status: DC | PRN

## 2022-01-24 MED ORDER — OXYCODONE HCL 5 MG PO TABS: 5.0000 mg | ORAL_TABLET | Freq: Four times a day (QID) | ORAL | 0 refills | Status: AC | PRN

## 2022-01-24 NOTE — Telephone Encounter (Signed)
Patient is calling because his pharmacy for medication, said that it has to pre approved to receive, called pharmacy to confirm this and they said that his medication is ready for pick up, updated patient.

## 2022-01-27 DIAGNOSIS — M4306 Spondylolysis, lumbar region: Secondary | ICD-10-CM | POA: Diagnosis not present

## 2022-01-27 DIAGNOSIS — I700 Atherosclerosis of aorta: Secondary | ICD-10-CM | POA: Diagnosis not present

## 2022-01-27 DIAGNOSIS — E1129 Type 2 diabetes mellitus with other diabetic kidney complication: Secondary | ICD-10-CM | POA: Diagnosis not present

## 2022-01-27 DIAGNOSIS — T819XXA Unspecified complication of procedure, initial encounter: Secondary | ICD-10-CM | POA: Diagnosis not present

## 2022-01-27 DIAGNOSIS — E11628 Type 2 diabetes mellitus with other skin complications: Secondary | ICD-10-CM | POA: Diagnosis not present

## 2022-01-27 DIAGNOSIS — E1142 Type 2 diabetes mellitus with diabetic polyneuropathy: Secondary | ICD-10-CM | POA: Diagnosis not present

## 2022-01-27 DIAGNOSIS — I10 Essential (primary) hypertension: Secondary | ICD-10-CM | POA: Diagnosis not present

## 2022-01-27 DIAGNOSIS — M21961 Unspecified acquired deformity of right lower leg: Secondary | ICD-10-CM | POA: Diagnosis not present

## 2022-01-27 DIAGNOSIS — M869 Osteomyelitis, unspecified: Secondary | ICD-10-CM | POA: Diagnosis not present

## 2022-01-27 DIAGNOSIS — S91301A Unspecified open wound, right foot, initial encounter: Secondary | ICD-10-CM | POA: Diagnosis not present

## 2022-01-27 DIAGNOSIS — E1140 Type 2 diabetes mellitus with diabetic neuropathy, unspecified: Secondary | ICD-10-CM | POA: Diagnosis not present

## 2022-01-27 DIAGNOSIS — M86171 Other acute osteomyelitis, right ankle and foot: Secondary | ICD-10-CM | POA: Diagnosis not present

## 2022-01-30 ENCOUNTER — Ambulatory Visit (INDEPENDENT_AMBULATORY_CARE_PROVIDER_SITE_OTHER): Payer: 59 | Admitting: Podiatry

## 2022-01-30 DIAGNOSIS — T8149XA Infection following a procedure, other surgical site, initial encounter: Secondary | ICD-10-CM

## 2022-01-30 MED ORDER — CEPHALEXIN 500 MG PO CAPS: 500.0000 mg | ORAL_CAPSULE | Freq: Four times a day (QID) | ORAL | 0 refills | Status: DC

## 2022-02-03 ENCOUNTER — Encounter: Payer: Self-pay | Admitting: Podiatry

## 2022-02-03 NOTE — Progress Notes (Signed)
  Subjective:  Patient ID: [name], male    DOB: [birth year],  MRN: [id number]  Chief Complaint  Patient presents with   Routine Post Op    POV #1 DOS 01/24/2022 LT 4TH TOE AMPUTATION PARTIAL      62 y.o. male returns for post-op check.  He said it has been hurting quite a bit  Review of Systems: Negative except as noted in the HPI. Denies N/V/F/Ch.   Objective:  There were no vitals filed for this visit. There is no height or weight on file to calculate BMI. Constitutional Well developed. Well nourished.  Vascular Foot warm and well perfused. Capillary refill normal to all digits.  Calf is soft and supple, no posterior calf or knee pain, negative Homans' sign  Neurologic Normal speech. Oriented to person, place, and time. Epicritic sensation to light touch grossly present bilaterally.  Dermatologic Incision shows some superficial necrosis and erythema and serous drainage  Orthopedic: Tenderness to palpation noted about the surgical site.    Assessment:   1. Surgical wound infection    Plan:  Patient was evaluated and treated and all questions answered.  S/p foot surgery left -Unfortunately he seems to have an acute surgical site infection.  I prescribed him Keflex 500 mg 4 times daily.  I will see him back in 1 week for reevaluation, hopeful area of necrosis is superficial and is able to heal thereafter, I will reevaluate him next week  No follow-ups on file.

## 2022-02-06 ENCOUNTER — Telehealth: Payer: Self-pay | Admitting: *Deleted

## 2022-02-06 ENCOUNTER — Inpatient Hospital Stay (HOSPITAL_COMMUNITY)
Admission: EM | Admit: 2022-02-06 | Discharge: 2022-02-14 | DRG: 857 | Disposition: A | Discharge status: Home Health | Payer: 59 | Attending: Internal Medicine | Admitting: Internal Medicine

## 2022-02-06 ENCOUNTER — Ambulatory Visit (INDEPENDENT_AMBULATORY_CARE_PROVIDER_SITE_OTHER): Payer: 59 | Admitting: Podiatry

## 2022-02-06 ENCOUNTER — Emergency Department (HOSPITAL_COMMUNITY): Payer: 59

## 2022-02-06 ENCOUNTER — Encounter (HOSPITAL_COMMUNITY): Payer: Self-pay

## 2022-02-06 ENCOUNTER — Other Ambulatory Visit: Payer: Self-pay

## 2022-02-06 DIAGNOSIS — M869 Osteomyelitis, unspecified: Secondary | ICD-10-CM | POA: Diagnosis not present

## 2022-02-06 DIAGNOSIS — L02416 Cutaneous abscess of left lower limb: Secondary | ICD-10-CM | POA: Diagnosis not present

## 2022-02-06 DIAGNOSIS — E119 Type 2 diabetes mellitus without complications: Secondary | ICD-10-CM

## 2022-02-06 DIAGNOSIS — L97529 Non-pressure chronic ulcer of other part of left foot with unspecified severity: Secondary | ICD-10-CM | POA: Diagnosis not present

## 2022-02-06 DIAGNOSIS — L03032 Cellulitis of left toe: Secondary | ICD-10-CM | POA: Diagnosis present

## 2022-02-06 DIAGNOSIS — L089 Local infection of the skin and subcutaneous tissue, unspecified: Secondary | ICD-10-CM | POA: Diagnosis not present

## 2022-02-06 DIAGNOSIS — T8149XA Infection following a procedure, other surgical site, initial encounter: Secondary | ICD-10-CM | POA: Diagnosis not present

## 2022-02-06 DIAGNOSIS — E11621 Type 2 diabetes mellitus with foot ulcer: Secondary | ICD-10-CM | POA: Diagnosis not present

## 2022-02-06 DIAGNOSIS — E1165 Type 2 diabetes mellitus with hyperglycemia: Secondary | ICD-10-CM | POA: Diagnosis present

## 2022-02-06 DIAGNOSIS — Y838 Other surgical procedures as the cause of abnormal reaction of the patient, or of later complication, without mention of misadventure at the time of the procedure: Secondary | ICD-10-CM | POA: Diagnosis present

## 2022-02-06 DIAGNOSIS — Z91040 Latex allergy status: Secondary | ICD-10-CM

## 2022-02-06 DIAGNOSIS — K219 Gastro-esophageal reflux disease without esophagitis: Secondary | ICD-10-CM | POA: Diagnosis present

## 2022-02-06 DIAGNOSIS — Z7982 Long term (current) use of aspirin: Secondary | ICD-10-CM

## 2022-02-06 DIAGNOSIS — D509 Iron deficiency anemia, unspecified: Secondary | ICD-10-CM | POA: Insufficient documentation

## 2022-02-06 DIAGNOSIS — G43909 Migraine, unspecified, not intractable, without status migrainosus: Secondary | ICD-10-CM | POA: Diagnosis present

## 2022-02-06 DIAGNOSIS — E1169 Type 2 diabetes mellitus with other specified complication: Secondary | ICD-10-CM | POA: Diagnosis not present

## 2022-02-06 DIAGNOSIS — Z6828 Body mass index (BMI) 28.0-28.9, adult: Secondary | ICD-10-CM

## 2022-02-06 DIAGNOSIS — T8781 Dehiscence of amputation stump: Secondary | ICD-10-CM | POA: Diagnosis not present

## 2022-02-06 DIAGNOSIS — Z794 Long term (current) use of insulin: Secondary | ICD-10-CM

## 2022-02-06 DIAGNOSIS — E1151 Type 2 diabetes mellitus with diabetic peripheral angiopathy without gangrene: Secondary | ICD-10-CM | POA: Diagnosis not present

## 2022-02-06 DIAGNOSIS — T819XXA Unspecified complication of procedure, initial encounter: Secondary | ICD-10-CM | POA: Diagnosis present

## 2022-02-06 DIAGNOSIS — M86172 Other acute osteomyelitis, left ankle and foot: Secondary | ICD-10-CM | POA: Diagnosis not present

## 2022-02-06 DIAGNOSIS — L02612 Cutaneous abscess of left foot: Secondary | ICD-10-CM | POA: Diagnosis not present

## 2022-02-06 DIAGNOSIS — I70222 Atherosclerosis of native arteries of extremities with rest pain, left leg: Secondary | ICD-10-CM | POA: Diagnosis not present

## 2022-02-06 DIAGNOSIS — B9620 Unspecified Escherichia coli [E. coli] as the cause of diseases classified elsewhere: Secondary | ICD-10-CM | POA: Diagnosis present

## 2022-02-06 DIAGNOSIS — D649 Anemia, unspecified: Secondary | ICD-10-CM | POA: Diagnosis not present

## 2022-02-06 DIAGNOSIS — T819XXD Unspecified complication of procedure, subsequent encounter: Secondary | ICD-10-CM | POA: Diagnosis not present

## 2022-02-06 DIAGNOSIS — E11628 Type 2 diabetes mellitus with other skin complications: Secondary | ICD-10-CM | POA: Diagnosis not present

## 2022-02-06 DIAGNOSIS — F419 Anxiety disorder, unspecified: Secondary | ICD-10-CM | POA: Diagnosis present

## 2022-02-06 DIAGNOSIS — I96 Gangrene, not elsewhere classified: Secondary | ICD-10-CM | POA: Diagnosis present

## 2022-02-06 DIAGNOSIS — E8809 Other disorders of plasma-protein metabolism, not elsewhere classified: Secondary | ICD-10-CM | POA: Diagnosis not present

## 2022-02-06 DIAGNOSIS — D638 Anemia in other chronic diseases classified elsewhere: Secondary | ICD-10-CM | POA: Diagnosis not present

## 2022-02-06 DIAGNOSIS — Z79899 Other long term (current) drug therapy: Secondary | ICD-10-CM | POA: Diagnosis not present

## 2022-02-06 DIAGNOSIS — Z89422 Acquired absence of other left toe(s): Secondary | ICD-10-CM

## 2022-02-06 DIAGNOSIS — D75839 Thrombocytosis, unspecified: Secondary | ICD-10-CM | POA: Diagnosis not present

## 2022-02-06 DIAGNOSIS — I10 Essential (primary) hypertension: Secondary | ICD-10-CM | POA: Diagnosis not present

## 2022-02-06 DIAGNOSIS — Z7984 Long term (current) use of oral hypoglycemic drugs: Secondary | ICD-10-CM | POA: Diagnosis not present

## 2022-02-06 DIAGNOSIS — G8918 Other acute postprocedural pain: Secondary | ICD-10-CM | POA: Diagnosis not present

## 2022-02-06 DIAGNOSIS — M24572 Contracture, left ankle: Secondary | ICD-10-CM

## 2022-02-06 DIAGNOSIS — Z8249 Family history of ischemic heart disease and other diseases of the circulatory system: Secondary | ICD-10-CM

## 2022-02-06 DIAGNOSIS — M48061 Spinal stenosis, lumbar region without neurogenic claudication: Secondary | ICD-10-CM | POA: Diagnosis present

## 2022-02-06 DIAGNOSIS — G8929 Other chronic pain: Secondary | ICD-10-CM | POA: Diagnosis present

## 2022-02-06 DIAGNOSIS — M868X7 Other osteomyelitis, ankle and foot: Secondary | ICD-10-CM | POA: Diagnosis not present

## 2022-02-06 DIAGNOSIS — T8141XA Infection following a procedure, superficial incisional surgical site, initial encounter: Secondary | ICD-10-CM | POA: Diagnosis not present

## 2022-02-06 DIAGNOSIS — E876 Hypokalemia: Secondary | ICD-10-CM | POA: Diagnosis present

## 2022-02-06 DIAGNOSIS — M7989 Other specified soft tissue disorders: Secondary | ICD-10-CM | POA: Diagnosis not present

## 2022-02-06 DIAGNOSIS — Z87891 Personal history of nicotine dependence: Secondary | ICD-10-CM

## 2022-02-06 DIAGNOSIS — T8744 Infection of amputation stump, left lower extremity: Secondary | ICD-10-CM | POA: Diagnosis not present

## 2022-02-06 DIAGNOSIS — I70245 Atherosclerosis of native arteries of left leg with ulceration of other part of foot: Secondary | ICD-10-CM | POA: Diagnosis not present

## 2022-02-06 DIAGNOSIS — F17210 Nicotine dependence, cigarettes, uncomplicated: Secondary | ICD-10-CM | POA: Diagnosis not present

## 2022-02-06 DIAGNOSIS — E1152 Type 2 diabetes mellitus with diabetic peripheral angiopathy with gangrene: Secondary | ICD-10-CM | POA: Diagnosis not present

## 2022-02-06 DIAGNOSIS — Z8711 Personal history of peptic ulcer disease: Secondary | ICD-10-CM

## 2022-02-06 DIAGNOSIS — E663 Overweight: Secondary | ICD-10-CM | POA: Diagnosis present

## 2022-02-06 DIAGNOSIS — Z833 Family history of diabetes mellitus: Secondary | ICD-10-CM

## 2022-02-06 HISTORY — DX: Other chronic osteomyelitis, right ankle and foot: M86.671

## 2022-02-06 LAB — URINALYSIS, ROUTINE W REFLEX MICROSCOPIC
Bilirubin Urine: NEGATIVE
Glucose, UA: NEGATIVE mg/dL
Hgb urine dipstick: NEGATIVE
Ketones, ur: NEGATIVE mg/dL
Leukocytes,Ua: NEGATIVE
Nitrite: NEGATIVE
Protein, ur: 100 mg/dL — AB
Specific Gravity, Urine: 1.031 — ABNORMAL HIGH (ref 1.005–1.030)
pH: 5 (ref 5.0–8.0)

## 2022-02-06 LAB — CBC WITH DIFFERENTIAL/PLATELET
Abs Immature Granulocytes: 0.15 10*3/uL — ABNORMAL HIGH (ref 0.00–0.07)
Basophils Absolute: 0.1 10*3/uL (ref 0.0–0.1)
Basophils Relative: 0 %
Eosinophils Absolute: 0.1 10*3/uL (ref 0.0–0.5)
Eosinophils Relative: 1 %
HCT: 32.6 % — ABNORMAL LOW (ref 39.0–52.0)
Hemoglobin: 10.4 g/dL — ABNORMAL LOW (ref 13.0–17.0)
Immature Granulocytes: 1 %
Lymphocytes Relative: 12 %
Lymphs Abs: 1.6 10*3/uL (ref 0.7–4.0)
MCH: 25.1 pg — ABNORMAL LOW (ref 26.0–34.0)
MCHC: 31.9 g/dL (ref 30.0–36.0)
MCV: 78.6 fL — ABNORMAL LOW (ref 80.0–100.0)
Monocytes Absolute: 1 10*3/uL (ref 0.1–1.0)
Monocytes Relative: 7 %
Neutro Abs: 10.7 10*3/uL — ABNORMAL HIGH (ref 1.7–7.7)
Neutrophils Relative %: 79 %
Platelets: 373 10*3/uL (ref 150–400)
RBC: 4.15 MIL/uL — ABNORMAL LOW (ref 4.22–5.81)
RDW: 15.9 % — ABNORMAL HIGH (ref 11.5–15.5)
WBC: 13.5 10*3/uL — ABNORMAL HIGH (ref 4.0–10.5)
nRBC: 0 % (ref 0.0–0.2)

## 2022-02-06 LAB — RETICULOCYTES
Immature Retic Fract: 33.5 % — ABNORMAL HIGH (ref 2.3–15.9)
RBC.: 4.15 MIL/uL — ABNORMAL LOW (ref 4.22–5.81)
Retic Count, Absolute: 52.7 10*3/uL (ref 19.0–186.0)
Retic Ct Pct: 1.3 % (ref 0.4–3.1)

## 2022-02-06 LAB — COMPREHENSIVE METABOLIC PANEL
ALT: 27 U/L (ref 0–44)
AST: 20 U/L (ref 15–41)
Albumin: 2.7 g/dL — ABNORMAL LOW (ref 3.5–5.0)
Alkaline Phosphatase: 120 U/L (ref 38–126)
Anion gap: 11 (ref 5–15)
BUN: 12 mg/dL (ref 6–20)
CO2: 25 mmol/L (ref 22–32)
Calcium: 9 mg/dL (ref 8.9–10.3)
Chloride: 101 mmol/L (ref 98–111)
Creatinine, Ser: 0.79 mg/dL (ref 0.61–1.24)
GFR, Estimated: >60 mL/min (ref >60)
Glucose, Bld: 87 mg/dL (ref 70–99)
Potassium: 3.3 mmol/L — ABNORMAL LOW (ref 3.5–5.1)
Sodium: 137 mmol/L (ref 135–145)
Total Bilirubin: 0.4 mg/dL (ref 0.3–1.2)
Total Protein: 6.6 g/dL (ref 6.5–8.1)

## 2022-02-06 LAB — FERRITIN: Ferritin: 76 ng/mL (ref 24–336)

## 2022-02-06 LAB — HIV ANTIBODY (ROUTINE TESTING W REFLEX): HIV Screen 4th Generation wRfx: NONREACTIVE

## 2022-02-06 LAB — IRON AND TIBC
Iron: 12 ug/dL — ABNORMAL LOW (ref 45–182)
Saturation Ratios: 4 % — ABNORMAL LOW (ref 17.9–39.5)
TIBC: 300 ug/dL (ref 250–450)
UIBC: 288 ug/dL

## 2022-02-06 LAB — LACTIC ACID, PLASMA
Lactic Acid, Venous: 0.7 mmol/L (ref 0.5–1.9)
Lactic Acid, Venous: 0.7 mmol/L (ref 0.5–1.9)

## 2022-02-06 LAB — MAGNESIUM: Magnesium: 1.8 mg/dL (ref 1.7–2.4)

## 2022-02-06 LAB — GLUCOSE, CAPILLARY: Glucose-Capillary: 84 mg/dL (ref 70–99)

## 2022-02-06 MED ORDER — HYDROMORPHONE HCL 1 MG/ML IJ SOLN
0.5000 mg | INTRAMUSCULAR | Status: DC | PRN
  Administered 2022-02-07 (×3): 1 mg via INTRAVENOUS
  Filled 2022-02-06 (×4): qty 1

## 2022-02-06 MED ORDER — BUPROPION HCL ER (XL) 300 MG PO TB24
300.0000 mg | ORAL_TABLET | Freq: Every day | ORAL | Status: DC
  Administered 2022-02-07 – 2022-02-14 (×8): 300 mg via ORAL
  Filled 2022-02-06: qty 2
  Filled 2022-02-06: qty 1
  Filled 2022-02-06: qty 2
  Filled 2022-02-06 (×4): qty 1
  Filled 2022-02-06: qty 2

## 2022-02-06 MED ORDER — POLYETHYLENE GLYCOL 3350 17 G PO PACK
17.0000 g | PACK | Freq: Every day | ORAL | Status: DC | PRN
  Administered 2022-02-08: 17 g via ORAL
  Filled 2022-02-06: qty 1

## 2022-02-06 MED ORDER — HYDROMORPHONE HCL 1 MG/ML IJ SOLN: 0.5000 mg | INTRAMUSCULAR | Status: DC | PRN

## 2022-02-06 MED ORDER — ASPIRIN-ACETAMINOPHEN-CAFFEINE 250-250-65 MG PO TABS
1.0000 | ORAL_TABLET | Freq: Four times a day (QID) | ORAL | Status: DC | PRN
  Administered 2022-02-08 – 2022-02-09 (×2): 1 via ORAL
  Filled 2022-02-06 (×4): qty 1

## 2022-02-06 MED ORDER — INSULIN ASPART 100 UNIT/ML IJ SOLN
0.0000 [IU] | Freq: Three times a day (TID) | INTRAMUSCULAR | Status: DC
  Administered 2022-02-07: 5 [IU] via SUBCUTANEOUS
  Administered 2022-02-08 (×2): 3 [IU] via SUBCUTANEOUS
  Administered 2022-02-09: 11 [IU] via SUBCUTANEOUS
  Administered 2022-02-09: 5 [IU] via SUBCUTANEOUS
  Administered 2022-02-09 – 2022-02-11 (×2): 2 [IU] via SUBCUTANEOUS
  Administered 2022-02-12: 5 [IU] via SUBCUTANEOUS
  Administered 2022-02-12: 3 [IU] via SUBCUTANEOUS
  Administered 2022-02-12 – 2022-02-13 (×2): 5 [IU] via SUBCUTANEOUS
  Administered 2022-02-13: 3 [IU] via SUBCUTANEOUS
  Administered 2022-02-14: 5 [IU] via SUBCUTANEOUS

## 2022-02-06 MED ORDER — POTASSIUM CHLORIDE CRYS ER 20 MEQ PO TBCR
40.0000 meq | EXTENDED_RELEASE_TABLET | Freq: Once | ORAL | Status: AC
  Administered 2022-02-06: 40 meq via ORAL
  Filled 2022-02-06: qty 2

## 2022-02-06 MED ORDER — FUROSEMIDE 20 MG PO TABS
20.0000 mg | ORAL_TABLET | Freq: Every day | ORAL | Status: DC
  Administered 2022-02-07 – 2022-02-14 (×8): 20 mg via ORAL
  Filled 2022-02-06 (×8): qty 1

## 2022-02-06 MED ORDER — METRONIDAZOLE 500 MG/100ML IV SOLN
500.0000 mg | Freq: Two times a day (BID) | INTRAVENOUS | Status: DC
  Administered 2022-02-06: 500 mg via INTRAVENOUS
  Filled 2022-02-06: qty 100

## 2022-02-06 MED ORDER — MORPHINE SULFATE (PF) 4 MG/ML IV SOLN
4.0000 mg | Freq: Once | INTRAVENOUS | Status: AC
  Administered 2022-02-06: 4 mg via INTRAVENOUS
  Filled 2022-02-06: qty 1

## 2022-02-06 MED ORDER — INSULIN GLARGINE-YFGN 100 UNIT/ML ~~LOC~~ SOLN
30.0000 [IU] | Freq: Two times a day (BID) | SUBCUTANEOUS | Status: DC
  Administered 2022-02-07 – 2022-02-14 (×15): 30 [IU] via SUBCUTANEOUS
  Filled 2022-02-06 (×16): qty 0.3

## 2022-02-06 MED ORDER — VANCOMYCIN HCL 1500 MG/300ML IV SOLN
1500.0000 mg | Freq: Once | INTRAVENOUS | Status: DC
  Filled 2022-02-06: qty 300

## 2022-02-06 MED ORDER — DILTIAZEM HCL ER COATED BEADS 300 MG PO CP24
420.0000 mg | ORAL_CAPSULE | Freq: Every day | ORAL | Status: DC
  Administered 2022-02-07 – 2022-02-14 (×7): 420 mg via ORAL
  Filled 2022-02-06 (×11): qty 1

## 2022-02-06 MED ORDER — ATORVASTATIN CALCIUM 10 MG PO TABS
20.0000 mg | ORAL_TABLET | Freq: Every day | ORAL | Status: DC
  Administered 2022-02-07 – 2022-02-10 (×4): 20 mg via ORAL
  Filled 2022-02-06 (×4): qty 2

## 2022-02-06 MED ORDER — ALPRAZOLAM 0.5 MG PO TABS
0.5000 mg | ORAL_TABLET | Freq: Two times a day (BID) | ORAL | Status: DC | PRN
  Administered 2022-02-08 – 2022-02-13 (×6): 0.5 mg via ORAL
  Filled 2022-02-06 (×6): qty 1

## 2022-02-06 MED ORDER — ASPIRIN 81 MG PO TBEC
81.0000 mg | DELAYED_RELEASE_TABLET | Freq: Every day | ORAL | Status: DC
  Administered 2022-02-08 – 2022-02-14 (×6): 81 mg via ORAL
  Filled 2022-02-06 (×6): qty 1

## 2022-02-06 MED ORDER — GABAPENTIN 400 MG PO CAPS
800.0000 mg | ORAL_CAPSULE | Freq: Three times a day (TID) | ORAL | Status: DC
  Administered 2022-02-06 – 2022-02-14 (×22): 800 mg via ORAL
  Filled 2022-02-06 (×22): qty 2

## 2022-02-06 MED ORDER — PANTOPRAZOLE SODIUM 40 MG PO TBEC
40.0000 mg | DELAYED_RELEASE_TABLET | Freq: Every day | ORAL | Status: DC
  Administered 2022-02-07 – 2022-02-14 (×8): 40 mg via ORAL
  Filled 2022-02-06 (×8): qty 1

## 2022-02-06 MED ORDER — SODIUM CHLORIDE 0.9 % IV SOLN
2.0000 g | INTRAVENOUS | Status: DC
  Administered 2022-02-07: 2 g via INTRAVENOUS
  Filled 2022-02-06: qty 20

## 2022-02-06 MED ORDER — SODIUM CHLORIDE 0.9 % IV SOLN
2.0000 g | INTRAVENOUS | Status: DC
  Administered 2022-02-06: 2 g via INTRAVENOUS
  Filled 2022-02-06: qty 20

## 2022-02-06 MED ORDER — ACETAMINOPHEN 650 MG RE SUPP: 650.0000 mg | Freq: Four times a day (QID) | RECTAL | Status: DC | PRN

## 2022-02-06 MED ORDER — METRONIDAZOLE 500 MG/100ML IV SOLN
500.0000 mg | Freq: Two times a day (BID) | INTRAVENOUS | Status: DC
  Administered 2022-02-07: 500 mg via INTRAVENOUS
  Filled 2022-02-06: qty 100

## 2022-02-06 MED ORDER — ACETAMINOPHEN 325 MG PO TABS
650.0000 mg | ORAL_TABLET | Freq: Four times a day (QID) | ORAL | Status: DC | PRN
  Administered 2022-02-06 – 2022-02-08 (×3): 650 mg via ORAL
  Filled 2022-02-06 (×4): qty 2

## 2022-02-06 MED ORDER — AMLODIPINE BESYLATE 5 MG PO TABS
5.0000 mg | ORAL_TABLET | Freq: Every day | ORAL | Status: DC
  Administered 2022-02-07 – 2022-02-14 (×8): 5 mg via ORAL
  Filled 2022-02-06 (×8): qty 1

## 2022-02-06 MED ORDER — SODIUM CHLORIDE 0.9% FLUSH
3.0000 mL | Freq: Two times a day (BID) | INTRAVENOUS | Status: DC
  Administered 2022-02-06 – 2022-02-13 (×10): 3 mL via INTRAVENOUS

## 2022-02-06 MED ORDER — LOSARTAN POTASSIUM 50 MG PO TABS
100.0000 mg | ORAL_TABLET | Freq: Every day | ORAL | Status: DC
  Administered 2022-02-07 – 2022-02-14 (×8): 100 mg via ORAL
  Filled 2022-02-06 (×8): qty 2

## 2022-02-06 MED ORDER — VANCOMYCIN HCL 1250 MG/250ML IV SOLN
1250.0000 mg | Freq: Two times a day (BID) | INTRAVENOUS | Status: DC
  Filled 2022-02-06: qty 250

## 2022-02-06 MED ORDER — HYDROCODONE-ACETAMINOPHEN 5-325 MG PO TABS
1.0000 | ORAL_TABLET | Freq: Four times a day (QID) | ORAL | Status: DC | PRN
  Administered 2022-02-07 – 2022-02-12 (×12): 1 via ORAL
  Filled 2022-02-06 (×13): qty 1

## 2022-02-06 NOTE — ED Provider Triage Note (Signed)
Emergency Medicine Provider Triage Evaluation Note  JOBANNY MAVIS , a 62 y.o. male  was evaluated in triage.  Pt complains of foot infection. Had L toes amputation 2 weeks ago.  Develop infection at surgical site.  Had fever, chills x 2 days.  Seen by surgeon who recommend hospital admission for IV abx and likely surgical intervention  Review of Systems  Positive: As above Negative: As above  Physical Exam  BP (!) 149/78   Pulse (!) 107   Temp 98.7 F (37.1 C) (Oral)   Resp 18   Ht '5\' 9"'$  (1.753 m)   Wt 85.3 kg   SpO2 98%   BMI 27.76 kg/m  Gen:   Awake, no distress   Resp:  Normal effort  MSK:   Moves extremities without difficulty  Other:    Medical Decision Making  Medically screening exam initiated at 11:13 AM.  Appropriate orders placed.  DEZMON CONOVER was informed that the remainder of the evaluation will be completed by another provider, this initial triage assessment does not replace that evaluation, and the importance of remaining in the ED until their evaluation is complete.     Domenic Moras, PA-C 02/06/22 1114

## 2022-02-06 NOTE — ED Provider Notes (Signed)
[facility] EMERGENCY DEPARTMENT Provider Note   CSN: [id number] Arrival date & time: 02/06/22  1046     History  Chief Complaint  Patient presents with   Foot Pain    KHAYDEN HERZBERG is a 62 y.o. male.  The history is provided by the patient and medical records. No language interpreter was used.  Foot Pain     62 year old male with history of diabetes, diabetic foot ulcer recently seen by his podiatry today and sent over for concerns of infection of his left foot after toes amputation 2 weeks prior.  Patient underwent digital amputations of the distal left fourth digit proximally 2 weeks prior.  He continues to develop worsening pain at the surgical site.  He was seen on 01/30/2022 by his podiatrist, Dr. Sherryle Lis, who felt patient has developed an acute surgical site infection.  Patient was prescribed Keflex 500 mg 4 times daily with recommendation to return.  He once again was seen at the podiatry office today but due to having worsening symptoms and feeling sick as well as feeling nauseous, patient was recommended to come to the ER to be admitted.  Podiatrist notes indicate that he would prefer patient to be started on broad-spectrum antibiotic as well as a repeat of his noninvasive vascular study.  He does not recommend obtaining MRI.  Plan to perform surgery tomorrow evening.  Note also mention that wound culture of drainage was taken and all sutures were removed in the office.  Home Medications Prior to Admission medications   Medication Sig Start Date End Date Taking? Authorizing Provider  acetaminophen (TYLENOL) 500 MG tablet Take 2 tablets (1,000 mg total) by mouth every 6 (six) hours as needed for up to 14 days (pain). 01/24/22 02/07/22  McDonald, Stephan Minister, DPM  AIMOVIG 140 MG/ML SOAJ Inject 140 mg into the skin every 30 (thirty) days. 05/20/21   [provider]  ALPRAZolam Duanne Moron) 0.5 MG tablet Take 0.5 mg by mouth 3 (three) times daily. 02/25/15    [provider]  amLODipine (NORVASC) 5 MG tablet Take 5 mg by mouth daily.    [provider]  aspirin EC 81 MG tablet Take 1 tablet (81 mg total) by mouth daily with breakfast. 06/22/19   Roxan Hockey, MD  aspirin-acetaminophen-caffeine (EXCEDRIN MIGRAINE) [phone] MG tablet Take 1 tablet by mouth every 6 (six) hours as needed for headache.    [provider]  atorvastatin (LIPITOR) 20 MG tablet Take 20 mg by mouth daily. 03/31/19   [provider]  buPROPion (WELLBUTRIN XL) 300 MG 24 hr tablet Take 300 mg by mouth daily. 08/06/20   [provider]  cephALEXin (KEFLEX) 500 MG capsule Take 1 capsule (500 mg total) by mouth 4 (four) times daily for 7 days. 01/30/22 02/06/22  McDonald, Stephan Minister, DPM  diclofenac Sodium (VOLTAREN) 1 % GEL Apply 2 g topically daily as needed (pain). 03/14/21   [provider]  diltiazem (TIAZAC) 420 MG 24 hr capsule Take 420 mg by mouth daily. 02/20/19   [provider]  DULoxetine (CYMBALTA) 60 MG capsule Take 60 mg by mouth daily.    [provider]  furosemide (LASIX) 20 MG tablet Take 20 mg by mouth daily. 11/25/19   [provider]  gabapentin (NEURONTIN) 800 MG tablet Take 800 mg by mouth 3 (three) times daily. 08/29/19   [provider]  insulin aspart (NOVOLOG) 100 UNIT/ML FlexPen Inject 0-10 Units into the skin 3 (three) times daily with  meals. insulin aspart (novoLOG) injection 0-10 Units 0-10 Units Subcutaneous, 3 times daily with meals CBG < 70: Implement Hypoglycemia Standing Orders and refer to Hypoglycemia Standing Orders sidebar report  CBG 70 - 120: 0 unit CBG 121 - 150: 0 unit  CBG 151 - 200: 1 unit CBG 201 - 250: 2 units CBG 251 - 300: 4 units CBG 301 - 350: 6 units  CBG 351 - 400: 8 units  CBG > 400: 10 units Patient not taking: Reported on 05/22/2021 06/22/19   Roxan Hockey, MD  Insulin Glargine (BASAGLAR KWIKPEN) 100 UNIT/ML Inject 45 Units into the skin 2 (two) times  daily.    [provider]  losartan (COZAAR) 100 MG tablet Take 100 mg by mouth daily. 05/10/21   [provider]  metFORMIN (GLUCOPHAGE) 500 MG tablet Take 1,000 mg by mouth in the morning and at bedtime. In the morning & 1300    [provider]  Multiple Vitamin (MULTIVITAMIN WITH MINERALS) TABS tablet Take 1 tablet by mouth daily.    [provider]  naproxen (NAPROSYN) 500 MG tablet Take 500 mg by mouth 2 (two) times daily as needed for moderate pain. 06/19/20   [provider]  ondansetron (ZOFRAN) 4 MG tablet Take 4 mg by mouth every 8 (eight) hours as needed for nausea or vomiting. 04/25/21   [provider]  oxyCODONE-acetaminophen (PERCOCET) 10-325 MG tablet Take 1 tablet by mouth 4 (four) times daily as needed for pain. 05/27/21   Shelly Coss, MD  pantoprazole (PROTONIX) 40 MG tablet TAKE 1 TABLET(40 MG) BY MOUTH DAILY Patient taking differently: Take 40 mg by mouth daily. 04/30/20   Arnoldo Lenis, MD  sitaGLIPtin (JANUVIA) 100 MG tablet Take 100 mg by mouth daily.    [provider]  sulfamethoxazole-trimethoprim (BACTRIM DS) 800-160 MG tablet Take 1 tablet by mouth 2 (two) times daily. Patient taking differently: Take 1 tablet by mouth 2 (two) times daily. Start date : 05/09/21 05/09/21   Aviva Signs, MD  tiZANidine (ZANAFLEX) 4 MG tablet Take 1 tablet (4 mg total) by mouth every 8 (eight) hours as needed for muscle spasms. 10/13/19   Viona Gilmore D, NP      Allergies    Latex    Review of Systems   Review of Systems  All other systems reviewed and are negative.   Physical Exam Updated Vital Signs BP (!) 158/80 (BP Location: Right Arm)   Pulse (!) 105   Temp 98.5 F (36.9 C)   Resp 18   Ht '5\' 9"'$  (1.753 m)   Wt 85.3 kg   SpO2 96%   BMI 27.76 kg/m  Physical Exam Vitals and nursing note reviewed.  Constitutional:      General: He is not in acute distress.    Appearance: He is well-developed.  HENT:      Head: Atraumatic.  Eyes:     Conjunctiva/sclera: Conjunctivae normal.  Musculoskeletal:        General: Tenderness (Left foot: Tenderness to surgical site at the fourth digit with with necrotic skin change distance around skin erythema and edema and warmth extending toward the foot and ankle.) present.     Cervical back: Neck supple.  Skin:    Capillary Refill: Capillary refill takes less than 2 seconds.     Findings: No rash.  Neurological:     Mental Status: He is alert.     ED Results / Procedures / Treatments   Labs (all labs ordered  are listed, but only abnormal results are displayed) Labs Reviewed  COMPREHENSIVE METABOLIC PANEL - Abnormal; Notable for the following components:      Result Value   Potassium 3.3 (*)    Albumin 2.7 (*)    All other components within normal limits  CBC WITH DIFFERENTIAL/PLATELET - Abnormal; Notable for the following components:   WBC 13.5 (*)    RBC 4.15 (*)    Hemoglobin 10.4 (*)    HCT 32.6 (*)    MCV 78.6 (*)    MCH 25.1 (*)    RDW 15.9 (*)    Neutro Abs 10.7 (*)    Abs Immature Granulocytes 0.15 (*)    All other components within normal limits  URINALYSIS, ROUTINE W REFLEX MICROSCOPIC - Abnormal; Notable for the following components:   Color, Urine AMBER (*)    APPearance HAZY (*)    Specific Gravity, Urine 1.031 (*)    Protein, ur 100 (*)    Bacteria, UA RARE (*)    All other components within normal limits  CULTURE, BLOOD (ROUTINE X 2)  CULTURE, BLOOD (ROUTINE X 2)  LACTIC ACID, PLASMA  LACTIC ACID, PLASMA    EKG None  Radiology DG Foot Complete Left  Result Date: 02/06/2022 CLINICAL DATA:  Left foot infection, recent digital amputation EXAM: LEFT FOOT - COMPLETE 3+ VIEW COMPARISON:  09/26/2021 FINDINGS: There is no evidence of fracture or dislocation. Status post distal digital amputation of the left fourth digit. Unchanged chronic bony erosion of the tufts of the left second and third digits. Diffuse soft tissue edema  of the forefoot. Vascular calcinosis. IMPRESSION: 1. Status post distal digital amputation of the distal left fourth digit. 2. Unchanged chronic bony erosion of the tufts of the left second and third digits. 3. MRI is the test of choice for the evaluation of known or suspected osteomyelitis. 4. Diffuse soft tissue edema of the forefoot. Electronically Signed   By: Delanna Ahmadi M.D.   On: 02/06/2022 12:04    Procedures Procedures    Medications Ordered in ED Medications  cefTRIAXone (ROCEPHIN) 2 g in sodium chloride 0.9 % 100 mL IVPB (0 g Intravenous Stopped 02/06/22 1746)    And  metroNIDAZOLE (FLAGYL) IVPB 500 mg (500 mg Intravenous New Bag/Given 02/06/22 1717)  morphine (PF) 4 MG/ML injection 4 mg (4 mg Intravenous Given 02/06/22 1712)    ED Course/ Medical Decision Making/ A&P                             Medical Decision Making Amount and/or Complexity of Data Reviewed Labs: ordered. Radiology: ordered.   BP (!) 158/80 (BP Location: Right Arm)   Pulse (!) 105   Temp 98.5 F (36.9 C)   Resp 18   Ht '5\' 9"'$  (1.753 m)   Wt 85.3 kg   SpO2 96%   BMI 27.76 kg/m   46:54 PM  62 year old male with history of diabetes, diabetic foot ulcer recently seen by his podiatry today and sent over for concerns of infection of his left foot after toes amputation 2 weeks prior.  Patient underwent digital amputations of the distal left fourth digit proximally 2 weeks prior.  He continues to develop worsening pain at the surgical site.  He was seen on 01/30/2022 by his podiatrist, Dr. Sherryle Lis, who felt patient has developed an acute surgical site infection.  Patient was prescribed Keflex 500 mg 4 times daily with recommendation to return.  He once again was seen at the podiatry office today but due to having worsening symptoms and feeling sick as well as feeling nauseous, patient was recommended to come to the ER to be admitted.  Podiatrist notes indicate that he would prefer patient to be started on  broad-spectrum antibiotic as well as a repeat of his noninvasive vascular study.  He does not recommend obtaining MRI.  Plan to perform surgery tomorrow evening.  Note also mention that wound culture of drainage was taken and all sutures were removed in the office.  On exam, patient laying bed appears uncomfortable and crying.  Left foot with tenderness to the surgical site with extending skin necrosis and erythema towards the midfoot and ankle region.  Dorsalis pedis pulse palpable.  Vital signs reviewed and remarkable for elevated blood pressure 158/80 likely secondary to pain.  He is afebrile.  He is tachycardic with heart rate 105.  He is not hypoxic.  -Labs ordered, independently viewed and interpreted by me.  Labs remarkable for WBC 13.5.  normal lactic acid.  Hgb 10.4 -The patient was maintained on a cardiac monitor.  I personally viewed and interpreted the cardiac monitored which showed an underlying rhythm of: sinus tachycardia -Imaging independently viewed and interpreted by me and I agree with radiologist's interpretation.  Result remarkable for xray of L foot showing diffused soft tissue edema of the forefoot along with status post distal digital amputation of the distal left fourth digit. -This patient presents to the ED for concern of foot infection, this involves an extensive number of treatment options, and is a complaint that carries with it a high risk of complications and morbidity.  The differential diagnosis includes diabetic foot ulcer, osteomyelitis, cellulitis -Co morbidities that complicate the patient evaluation includes DM, hx of diabetic foot ulcer -Treatment includes rocephin/metronidazole, morphine -Reevaluation of the patient after these medicines showed that the patient improved -PCP office notes or outside notes reviewed -reviewed note from pt's podiatrist Dr. Vivia Ewing -Escalation to admission/observation considered: patient agreeing with hospital admission  5:45  PM Appreciate consultation from [facility] hospitalist, Dr. Trilby Drummer who agrees to see and will admit pt for further care.  Additional pain medication ordered for better pain control.         Final Clinical Impression(s) / ED Diagnoses Final diagnoses:  Type 2 diabetes mellitus with left diabetic foot infection [facility])    Rx / DC Orders ED Discharge Orders     None         Domenic Moras, PA-C 02/06/22 1749    Elgie Congo, MD 02/06/22 2302

## 2022-02-06 NOTE — ED Triage Notes (Signed)
Pt sent over from Dr. Sherryle Lis from foot center. Pt had toe amputation 2 weeks ago and went to follow up appt today. Advised to come to ED for IV antibiotics. Sutures were removed today. PT complains of fever and pain for past few days.

## 2022-02-06 NOTE — Addendum Note (Signed)
Addended bySherryle Lis, Jordynn Marcella R on: 02/06/2022 12:43 PM   Modules accepted: Level of Service

## 2022-02-06 NOTE — Telephone Encounter (Signed)
Patient is aware.

## 2022-02-06 NOTE — H&P (Signed)
History and Physical   [name] WCH:[id number] DOB: May 15, 1960 DOA: 02/06/2022  PCP: [name], MD   Patient coming from: Home  Chief Complaint: Surgical site / Diabetic Foot infection  HPI: [name] is a 62 y.o. male with medical history significant of diabetes, hypertension, chronic pain, spinal stenosis, anxiety, PUD, history of osteomyelitis presenting with wound infection/diabetic foot infection.  A week ago patient had partial left fourth toe amputation and at 1 day postop visit there was concern for developing surgical site infection and patient was started on Keflex.  At his 1 week postop visit today patient was reporting 2 days of fevers, fatigue, nausea and has continued skin changes at the surgical sites that are worsening.  Podiatry sent patient to the ED due to failure of outpatient antibiotic therapy with plan for IV antibiotics, repeat imaging, likely surgery tomorrow morning.  Patient denies chills, chest pain, shortness of breath, abdominal pain, constipation, diarrhea.  ED Course: Vital signs in the ED significant for tachycardia in the 100s to 110s and blood pressure in the 353I to 144R systolic.  Lab workup included CMP with potassium 3.3, albumin 2.7.  CBC with leukocytosis to 13.5 and hemoglobin stable at 10.4 with microcytosis.  Lactic acid normal with repeat pending.  Urinalysis with protein and rare bacteria only.  Blood cultures pending.  Left foot x-ray showed patient status post left partial fourth digit amputation, stable chronic bony changes at left second and third digits and soft tissue edema.  Vascular ultrasound with ABI has been ordered and pending.  Patient received ceftriaxone and Flagyl in the ED as well as a dose of morphine.  Review of Systems: As per HPI otherwise all other systems reviewed and are negative.  Past Medical History:  Diagnosis Date   Acute biliary pancreatitis 10/23/2013   Acute osteomyelitis of right foot ([place])  05/22/2021   AKI (acute kidney injury) ([place]) 05/22/2021   Arthritis    Biliary colic 15/40/0867   Chronic osteomyelitis of toe of right foot ([place])    Diabetes mellitus    GERD (gastroesophageal reflux disease)    Headache(784.0)    History of kidney stones    Hypertension    Kidney stones    Neuropathy    Osteomyelitis of fourth toe of right foot ([place]) 06/16/2019    Past Surgical History:  Procedure Laterality Date   ACHILLES TENDON SURGERY Right 05/26/2021   Procedure: ACHILLES LENGTHENING/KIDNER;  Surgeon: Criselda Peaches, DPM;  Location: [place];  Service: Podiatry;  Laterality: Right;   AMPUTATION TOE Right 06/21/2019   Procedure: AMPUTATION TOE;  Surgeon: Aviva Signs, MD;  Location: [facility];  Service: General;  Laterality: Right;   AMPUTATION TOE Right 08/29/2020   Procedure: RIGHT THIRD AMPUTATION TOE;  Surgeon: Aviva Signs, MD;  Location: [facility];  Service: General;  Laterality: Right;  third toe   AMPUTATION TOE Right 02/20/2021   Procedure: AMPUTATION  TRANSMATARSAL  RIGHT; GREAT TOE;  Surgeon: Aviva Signs, MD;  Location: [facility];  Service: General;  Laterality: Right;   BACK SURGERY  2022   CARDIAC CATHETERIZATION  01/21/2007   CHOLECYSTECTOMY N/A 10/24/2013   Procedure: LAPAROSCOPIC CHOLECYSTECTOMY;  Surgeon: Jamesetta So, MD;  Location: [facility];  Service: General;  Laterality: N/A;   COLONOSCOPY N/A 03/31/2013   Procedure: COLONOSCOPY;  Surgeon: Rogene Houston, MD;  [facility];  Service: Endoscopy;  Laterality: N/A;  [place] FOOT Right 04/19/2021   Procedure: IRRIGATION  AND DEBRIDEMENT RIGHT SECOND TOE;  Surgeon: Aviva Signs, MD;  Location: [facility];  Service: General;  Laterality: Right;   SECONDARY CLOSURE OF WOUND Right 04/19/2021   Procedure: IRRIGATION AND DEBRIDEMENT SECONDARY CLOSURE OF RIGHT GREAT TOE WOUND;  Surgeon: Aviva Signs, MD;  Location: [facility];  Service: General;  Laterality: Right;   TRANSMETATARSAL AMPUTATION  Right 05/24/2021   Procedure: TRANSMETATARSAL AMPUTATION;  Surgeon: Criselda Peaches, DPM;  Location: [place];  Service: Podiatry;  Laterality: Right;   TRANSMETATARSAL AMPUTATION Right 05/26/2021   Procedure: TRANSMETATARSAL AMPUTATION Revision;  Surgeon: Criselda Peaches, DPM;  Location: [place];  Service: Podiatry;  Laterality: Right;    Social History  reports that he quit smoking about 40 years ago. His smoking use included cigarettes. He has a 7.00 pack-year smoking history. He has never used smokeless tobacco. He reports that he does not drink alcohol and does not use drugs.  Allergies  Allergen Reactions   Latex Rash and Other (See Comments)    Redness     Family History  Problem Relation Age of Onset   Diabetes Mother    Heart failure Mother    Heart disease Mother    Heart failure Father   Reviewed on admission  Prior to Admission medications   Medication Sig Start Date End Date Taking? Authorizing Provider  acetaminophen (TYLENOL) 500 MG tablet Take 2 tablets (1,000 mg total) by mouth every 6 (six) hours as needed for up to 14 days (pain). 01/24/22 02/07/22  McDonald, Stephan Minister, DPM  AIMOVIG 140 MG/ML SOAJ Inject 140 mg into the skin every 30 (thirty) days. 05/20/21   [provider]  ALPRAZolam Duanne Moron) 0.5 MG tablet Take 0.5 mg by mouth 3 (three) times daily. 02/25/15   [provider]  amLODipine (NORVASC) 5 MG tablet Take 5 mg by mouth daily.    [provider]  aspirin EC 81 MG tablet Take 1 tablet (81 mg total) by mouth daily with breakfast. 06/22/19   Roxan Hockey, MD  aspirin-acetaminophen-caffeine (EXCEDRIN MIGRAINE) [phone] MG tablet Take 1 tablet by mouth every 6 (six) hours as needed for headache.    [provider]  atorvastatin (LIPITOR) 20 MG tablet Take 20 mg by mouth daily. 03/31/19   [provider]  buPROPion (WELLBUTRIN XL) 300 MG 24 hr tablet Take 300 mg by mouth daily. 08/06/20   [provider]  cephALEXin  (KEFLEX) 500 MG capsule Take 1 capsule (500 mg total) by mouth 4 (four) times daily for 7 days. 01/30/22 02/06/22  McDonald, Stephan Minister, DPM  diclofenac Sodium (VOLTAREN) 1 % GEL Apply 2 g topically daily as needed (pain). 03/14/21   [provider]  diltiazem (TIAZAC) 420 MG 24 hr capsule Take 420 mg by mouth daily. 02/20/19   [provider]  DULoxetine (CYMBALTA) 60 MG capsule Take 60 mg by mouth daily.    [provider]  furosemide (LASIX) 20 MG tablet Take 20 mg by mouth daily. 11/25/19   [provider]  gabapentin (NEURONTIN) 800 MG tablet Take 800 mg by mouth 3 (three) times daily. 08/29/19   [provider]  insulin aspart (NOVOLOG) 100 UNIT/ML FlexPen Inject 0-10 Units into the skin 3 (three) times daily with meals. insulin aspart (novoLOG) injection 0-10 Units 0-10 Units Subcutaneous, 3 times daily with meals CBG < 70: Implement Hypoglycemia Standing Orders and refer to Hypoglycemia Standing Orders sidebar report  CBG 70 - 120: 0 unit CBG 121 - 150: 0 unit  CBG 151 - 200: 1 unit CBG 201 - 250: 2 units CBG 251 - 300: 4 units CBG 301 - 350: 6 units  CBG 351 - 400: 8 units  CBG > 400: 10 units Patient not taking: Reported on 05/22/2021 06/22/19   Roxan Hockey, MD  Insulin Glargine (BASAGLAR KWIKPEN) 100 UNIT/ML Inject 45 Units into the skin 2 (two) times daily.    [provider]  losartan (COZAAR) 100 MG tablet Take 100 mg by mouth daily. 05/10/21   [provider]  metFORMIN (GLUCOPHAGE) 500 MG tablet Take 1,000 mg by mouth in the morning and at bedtime. In the morning & 1300    [provider]  Multiple Vitamin (MULTIVITAMIN WITH MINERALS) TABS tablet Take 1 tablet by mouth daily.    [provider]  naproxen (NAPROSYN) 500 MG tablet Take 500 mg by mouth 2 (two) times daily as needed for moderate pain. 06/19/20   [provider]  ondansetron (ZOFRAN) 4 MG tablet Take 4 mg by mouth every 8 (eight) hours as needed for  nausea or vomiting. 04/25/21   [provider]  oxyCODONE-acetaminophen (PERCOCET) 10-325 MG tablet Take 1 tablet by mouth 4 (four) times daily as needed for pain. 05/27/21   Shelly Coss, MD  pantoprazole (PROTONIX) 40 MG tablet TAKE 1 TABLET(40 MG) BY MOUTH DAILY Patient taking differently: Take 40 mg by mouth daily. 04/30/20   Arnoldo Lenis, MD  sitaGLIPtin (JANUVIA) 100 MG tablet Take 100 mg by mouth daily.    [provider]  sulfamethoxazole-trimethoprim (BACTRIM DS) 800-160 MG tablet Take 1 tablet by mouth 2 (two) times daily. Patient taking differently: Take 1 tablet by mouth 2 (two) times daily. Start date : 05/09/21 05/09/21   Aviva Signs, MD  tiZANidine (ZANAFLEX) 4 MG tablet Take 1 tablet (4 mg total) by mouth every 8 (eight) hours as needed for muscle spasms. 10/13/19   Viona Gilmore D, NP    Physical Exam: Vitals:   02/06/22 1105 02/06/22 1106 02/06/22 1443 02/06/22 1730  BP:  (!) 149/78 (!) 158/80 (!) 164/90  Pulse: (!) 107  (!) 105 (!) 111  Resp: '18  18 17  '$ Temp: 98.7 F (37.1 C)  98.5 F (36.9 C)   TempSrc: Oral     SpO2: 98%  96% 99%  Weight:      Height:        Physical Exam Constitutional:      Appearance: Normal appearance.     Comments: Patient in mild to moderate distress secondary to pain from foot.  HENT:     Head: Normocephalic and atraumatic.     Mouth/Throat:     Mouth: Mucous membranes are moist.     Pharynx: Oropharynx is clear.  Eyes:     Extraocular Movements: Extraocular movements intact.     Pupils: Pupils are equal, round, and reactive to light.  Cardiovascular:     Rate and Rhythm: Normal rate and regular rhythm.     Pulses: Normal pulses.     Heart sounds: Normal heart sounds.  Pulmonary:     Effort: Pulmonary effort is normal. No respiratory distress.     Breath sounds: Normal breath sounds.  Abdominal:     General: Bowel sounds are normal. There is no distension.     Palpations: Abdomen is soft.      Tenderness: There is no abdominal tenderness.  Musculoskeletal:        General: No swelling or deformity.     Comments:  Status post right TMT.  Status post left partial fourth toe amputation, bandage in place.  Left foot with erythema, edema, warmth.  Skin:    General: Skin is warm and dry.  Neurological:     General: No focal deficit present.     Mental Status: Mental status is at baseline.     Labs on Admission: I have personally reviewed following labs and imaging studies  CBC: Recent Labs  Lab 02/06/22 1113  WBC 13.5*  NEUTROABS 10.7*  HGB 10.4*  HCT 32.6*  MCV 78.6*  PLT 778    Basic Metabolic Panel: Recent Labs  Lab 02/06/22 1113  NA 137  K 3.3*  CL 101  CO2 25  GLUCOSE 87  BUN 12  CREATININE 0.79  CALCIUM 9.0    GFR: Estimated Creatinine Clearance: 106.3 mL/min (by C-G formula based on SCr of 0.79 mg/dL).  Liver Function Tests: Recent Labs  Lab 02/06/22 1113  AST 20  ALT 27  ALKPHOS 120  BILITOT 0.4  PROT 6.6  ALBUMIN 2.7*    Urine analysis:    Component Value Date/Time   COLORURINE AMBER (A) 02/06/2022 1113   APPEARANCEUR HAZY (A) 02/06/2022 1113   LABSPEC 1.031 (H) 02/06/2022 1113   PHURINE 5.0 02/06/2022 1113   GLUCOSEU NEGATIVE 02/06/2022 1113   [place] 02/06/2022 1113   [place] 02/06/2022 1113   KETONESUR NEGATIVE 02/06/2022 1113   PROTEINUR 100 (A) 02/06/2022 1113   UROBILINOGEN 0.2 06/12/2014 1255   NITRITE NEGATIVE 02/06/2022 1113   LEUKOCYTESUR NEGATIVE 02/06/2022 1113    Radiological Exams on Admission: DG Foot Complete Left  Result Date: 02/06/2022 CLINICAL DATA:  Left foot infection, recent digital amputation EXAM: LEFT FOOT - COMPLETE 3+ VIEW COMPARISON:  09/26/2021 FINDINGS: There is no evidence of fracture or dislocation. Status post distal digital amputation of the left fourth digit. Unchanged chronic bony erosion of the tufts of the left second and third digits. Diffuse soft tissue edema of the  forefoot. Vascular calcinosis. IMPRESSION: 1. Status post distal digital amputation of the distal left fourth digit. 2. Unchanged chronic bony erosion of the tufts of the left second and third digits. 3. MRI is the test of choice for the evaluation of known or suspected osteomyelitis. 4. Diffuse soft tissue edema of the forefoot. Electronically Signed   By: Delanna Ahmadi M.D.   On: 02/06/2022 12:04    EKG: Not performed in the ED  Assessment/Plan Principal Problem:   Diabetic foot infection ([place]) Active Problems:   Controlled IDDM-2 with hyperglycemia   Chronic pain   HTN (hypertension)   Anxiety   Abnormal surgical wound   Spinal stenosis of lumbar region   Microcytic anemia   Hypokalemia   Diabetic foot infection Surgical site infection > Patient underwent partial left fourth digit amputation about a week ago for treatment of hammertoe. > At follow-up the following day there was concern for early surgical site infection and patient was started on Keflex.  At 1 week follow-up earlier today surgical site continues to look infected with patient reporting 2 days of fevers, fatigue, nausea. > Patient sent to the ED with plan for IV antibiotics and further amputation of left fourth digit tomorrow. > X-ray showed soft tissue edema, stable changes of second and third digit, no obvious osteomyelitis.  Vascular studies ordered but pending.  Patient received ceftriaxone and Flagyl in the ED. - Appreciate podiatry assistance with this patient - Monitor on telemetry given tachycardia and other systemic symptoms. - Continue  on ceftriaxone and Flagyl - Pain control with as needed Norco for moderate to severe pain and Dilaudid for severe breakthrough pain - N.p.o. at midnight  Anemia > Anemia stable around 10.4 with notable microcytosis. - Check iron studies - Continue to trend CBC  Hypokalemia > Noted to have mild hypokalemia at 3.3. - 40 mill equivalents p.o. potassium - Check magnesium -  Continue to trend renal function and electrolytes  Diabetes > On insulin 45U BID and SSI, and oral medications outpatient. - 30 units twice daily - SSI  Hypertension - Continue home amlodipine, diltiazem, losartan, Lasix  Anxiety - Continue home Wellbutrin, prn Xanax  PUD - Continue home PPI  Migraines - On Aimovig - Continue as needed Excedrin Migraine  Spinal stenosis Chronic pain - On pain medication as above - Continue home gabapentin  DVT prophylaxis: SCDs Code Status:   Full Family Communication:  Updated at bedside Disposition Plan:   Patient is from:  Home  Anticipated DC to:  Home  Anticipated DC date:  1 to 3 days  Anticipated DC barriers: None  Consults called:  Podiatry notified of arrival Admission status:  Inpatient, telemetry  Severity of Illness: The appropriate patient status for this patient is OBSERVATION. Observation status is judged to be reasonable and necessary in order to provide the required intensity of service to ensure the patient's safety. The patient's presenting symptoms, physical exam findings, and initial radiographic and laboratory data in the context of their medical condition is felt to place them at decreased risk for further clinical deterioration. Furthermore, it is anticipated that the patient will be medically stable for discharge from the hospital within 2 midnights of admission.   Marcelyn Bruins MD [facility]  How to contact the [facility] Attending or Consulting provider [place] or covering provider during after hours [place], for this patient?   Check the care team in [facility] and look for a) attending/consulting [facility] provider listed and b) the [facility] team listed Log into [email or web address] and use [employer]'s universal password to access. If you do not have the password, please contact the hospital operator. Locate the [facility] provider you are looking for under [facility] and page to a number that you can be directly reached. If  you still have difficulty reaching the provider, please page the [facility] (Director on Call) for the Hospitalists listed on amion for assistance.  02/06/2022, 6:38 PM

## 2022-02-06 NOTE — Progress Notes (Signed)
Pharmacy Antibiotic Note  [name] is a 62 y.o. male admitted on 02/06/2022 presenting with worsening surgical wound infection of foot.  Pharmacy has been consulted for vancomycin dosing.  Plan: Vancomycin 1500 mg IV x 1, then 1250 mg IV q 12h (eAUC 473) Monitor renal function, Cx and podiatry recs Vancomycin levels as indicated  Height: '5\' 9"'$  (175.3 cm) Weight: 85.3 kg (188 lb) IBW/kg (Calculated) : 70.7  Temp (24hrs), Avg:98.6 F (37 C), Min:98.5 F (36.9 C), Max:98.7 F (37.1 C)  Recent Labs  Lab 02/06/22 1113  WBC 13.5*  CREATININE 0.79  LATICACIDVEN 0.7    Estimated Creatinine Clearance: 106.3 mL/min (by C-G formula based on SCr of 0.79 mg/dL).    Allergies  Allergen Reactions   Latex Rash and Other (See Comments)    Redness     Bertis Ruddy, PharmD, [facility] Clinical Pharmacist ED Pharmacist Phone # [phone] 02/06/2022 4:58 PM

## 2022-02-06 NOTE — Progress Notes (Addendum)
  Subjective:  Patient ID: [name], male    DOB: 1960-08-01,  MRN: [id number]  Chief Complaint  Patient presents with   surgical wound infection    1 week follow up left foot      62 y.o. male returns for post-op check.  Pain continues to worsen he has felt sick has had fevers over the last 2 days and feels tired nauseous  Review of Systems: Denies chest pain shortness of breath   Objective:  There were no vitals filed for this visit. There is no height or weight on file to calculate BMI. Constitutional Well developed. Well nourished.  Vascular Foot warm and well perfused. Capillary refill normal to all digits.  Calf is soft and supple, no posterior calf or knee pain, negative Homans' sign  Neurologic Normal speech. Oriented to person, place, and time. Epicritic sensation to light touch grossly present bilaterally.  Dermatologic Necrosis progressing erythema and cellulitis up to the ankle with streaking up the leg  Orthopedic: Tenderness to palpation noted about the surgical site.    Assessment:   1. Surgical wound infection    Plan:  Patient was evaluated and treated and all questions answered.  S/p foot surgery left -Continues to worsen and now has systemic signs of infection with worsening cellulitis.  I recommend he be admitted to the hospital via the emergency room ASAP.  Discussed with him mode proceed there directly from the office and plan to be there for a few days.  I expect likely he will need further toe amputation of the remaining portions of the digit.  I would recommend he be on broad-spectrum antibiotics have new x-rays taken as well as a repeat of his noninvasive vascular studies.  Suspect he has a component of microvascular disease.  Do not think new MRI is necessary.  Will plan for surgery tomorrow evening which I discussed with him.  All questions addressed.  Wound culture of the serous drainage was taken, all sutures removed today  No follow-ups on  file.

## 2022-02-06 NOTE — Telephone Encounter (Signed)
Patient is calling to let the physician know that he is still at hospital since 10:00 am, still waiting, thought that they were going to have a room ready for him. Explained to patient that he should not leave and apologized for inconvenience.

## 2022-02-07 ENCOUNTER — Inpatient Hospital Stay (HOSPITAL_COMMUNITY): Payer: 59 | Admitting: Certified Registered Nurse Anesthetist

## 2022-02-07 ENCOUNTER — Encounter (HOSPITAL_COMMUNITY)
Admission: EM | Disposition: A | Discharge status: Home Health | Payer: Self-pay | Source: Home / Self Care | Attending: Internal Medicine

## 2022-02-07 ENCOUNTER — Encounter (HOSPITAL_COMMUNITY): Payer: Self-pay | Admitting: Internal Medicine

## 2022-02-07 ENCOUNTER — Inpatient Hospital Stay (HOSPITAL_COMMUNITY): Payer: 59

## 2022-02-07 ENCOUNTER — Other Ambulatory Visit: Payer: Self-pay

## 2022-02-07 DIAGNOSIS — T8744 Infection of amputation stump, left lower extremity: Secondary | ICD-10-CM

## 2022-02-07 DIAGNOSIS — T8781 Dehiscence of amputation stump: Secondary | ICD-10-CM

## 2022-02-07 DIAGNOSIS — I10 Essential (primary) hypertension: Secondary | ICD-10-CM

## 2022-02-07 DIAGNOSIS — L089 Local infection of the skin and subcutaneous tissue, unspecified: Secondary | ICD-10-CM

## 2022-02-07 DIAGNOSIS — Z794 Long term (current) use of insulin: Secondary | ICD-10-CM | POA: Diagnosis not present

## 2022-02-07 DIAGNOSIS — E11628 Type 2 diabetes mellitus with other skin complications: Secondary | ICD-10-CM

## 2022-02-07 DIAGNOSIS — Z87891 Personal history of nicotine dependence: Secondary | ICD-10-CM | POA: Diagnosis not present

## 2022-02-07 HISTORY — PX: AMPUTATION TOE: SHX6595

## 2022-02-07 LAB — CBC
HCT: 31.5 % — ABNORMAL LOW (ref 39.0–52.0)
Hemoglobin: 10.1 g/dL — ABNORMAL LOW (ref 13.0–17.0)
MCH: 25.1 pg — ABNORMAL LOW (ref 26.0–34.0)
MCHC: 32.1 g/dL (ref 30.0–36.0)
MCV: 78.4 fL — ABNORMAL LOW (ref 80.0–100.0)
Platelets: 357 10*3/uL (ref 150–400)
RBC: 4.02 MIL/uL — ABNORMAL LOW (ref 4.22–5.81)
RDW: 16.2 % — ABNORMAL HIGH (ref 11.5–15.5)
WBC: 11.6 10*3/uL — ABNORMAL HIGH (ref 4.0–10.5)
nRBC: 0 % (ref 0.0–0.2)

## 2022-02-07 LAB — BASIC METABOLIC PANEL
Anion gap: 9 (ref 5–15)
BUN: 12 mg/dL (ref 6–20)
CO2: 27 mmol/L (ref 22–32)
Calcium: 8.7 mg/dL — ABNORMAL LOW (ref 8.9–10.3)
Chloride: 102 mmol/L (ref 98–111)
Creatinine, Ser: 0.85 mg/dL (ref 0.61–1.24)
GFR, Estimated: >60 mL/min (ref >60)
Glucose, Bld: 127 mg/dL — ABNORMAL HIGH (ref 70–99)
Potassium: 3.8 mmol/L (ref 3.5–5.1)
Sodium: 138 mmol/L (ref 135–145)

## 2022-02-07 LAB — TYPE AND SCREEN
ABO/RH(D): B POS
Antibody Screen: NEGATIVE

## 2022-02-07 LAB — SURGICAL PCR SCREEN
MRSA, PCR: NEGATIVE
Staphylococcus aureus: NEGATIVE

## 2022-02-07 LAB — GLUCOSE, CAPILLARY
Glucose-Capillary: 139 mg/dL — ABNORMAL HIGH (ref 70–99)
Glucose-Capillary: 247 mg/dL — ABNORMAL HIGH (ref 70–99)
Glucose-Capillary: 65 mg/dL — ABNORMAL LOW (ref 70–99)
Glucose-Capillary: 117 mg/dL — ABNORMAL HIGH (ref 70–99)
Glucose-Capillary: 102 mg/dL — ABNORMAL HIGH (ref 70–99)
Glucose-Capillary: 102 mg/dL — ABNORMAL HIGH (ref 70–99)

## 2022-02-07 LAB — SEDIMENTATION RATE: Sed Rate: 75 mm/hr — ABNORMAL HIGH (ref 0–16)

## 2022-02-07 LAB — CK: Total CK: 49 U/L (ref 49–397)

## 2022-02-07 LAB — C-REACTIVE PROTEIN: CRP: 15.1 mg/dL — ABNORMAL HIGH (ref <1.0)

## 2022-02-07 SURGERY — AMPUTATION, TOE
Anesthesia: Monitor Anesthesia Care | Site: Toe | Laterality: Left

## 2022-02-07 MED ORDER — FENTANYL CITRATE (PF) 100 MCG/2ML IJ SOLN
INTRAMUSCULAR | Status: AC
  Filled 2022-02-07: qty 2

## 2022-02-07 MED ORDER — FENTANYL CITRATE (PF) 100 MCG/2ML IJ SOLN
25.0000 ug | INTRAMUSCULAR | Status: DC | PRN
  Administered 2022-02-07 (×4): 25 ug via INTRAVENOUS
  Administered 2022-02-07: 50 ug via INTRAVENOUS

## 2022-02-07 MED ORDER — BUPIVACAINE HCL (PF) 0.25 % IJ SOLN
INTRAMUSCULAR | Status: AC
  Filled 2022-02-07: qty 30

## 2022-02-07 MED ORDER — VANCOMYCIN HCL 500 MG IV SOLR
INTRAVENOUS | Status: AC
  Filled 2022-02-07: qty 10

## 2022-02-07 MED ORDER — SODIUM CHLORIDE 0.9 % IV SOLN
8.0000 mg/kg | Freq: Every day | INTRAVENOUS | Status: DC
  Administered 2022-02-07 – 2022-02-12 (×6): 700 mg via INTRAVENOUS
  Filled 2022-02-07 (×7): qty 14

## 2022-02-07 MED ORDER — HYDROMORPHONE HCL 1 MG/ML IJ SOLN
0.5000 mg | INTRAMUSCULAR | Status: DC | PRN
  Administered 2022-02-07 (×3): 1 mg via INTRAVENOUS
  Filled 2022-02-07 (×3): qty 1

## 2022-02-07 MED ORDER — CHLORHEXIDINE GLUCONATE 0.12 % MT SOLN
15.0000 mL | Freq: Once | OROMUCOSAL | Status: AC
  Administered 2022-02-07: 15 mL via OROMUCOSAL
  Filled 2022-02-07: qty 15

## 2022-02-07 MED ORDER — OXYCODONE HCL 5 MG PO TABS: 5.0000 mg | ORAL_TABLET | Freq: Once | ORAL | Status: DC | PRN

## 2022-02-07 MED ORDER — SODIUM CHLORIDE 0.9 % IV SOLN
1.0000 g | Freq: Three times a day (TID) | INTRAVENOUS | Status: DC
  Administered 2022-02-07 – 2022-02-09 (×5): 1 g via INTRAVENOUS
  Filled 2022-02-07 (×6): qty 20

## 2022-02-07 MED ORDER — 0.9 % SODIUM CHLORIDE (POUR BTL) OPTIME
TOPICAL | Status: DC | PRN
  Administered 2022-02-07: 1000 mL

## 2022-02-07 MED ORDER — OXYCODONE HCL 5 MG/5ML PO SOLN: 5.0000 mg | Freq: Once | ORAL | Status: DC | PRN

## 2022-02-07 MED ORDER — PHENYLEPHRINE HCL (PRESSORS) 10 MG/ML IV SOLN
INTRAVENOUS | Status: DC | PRN
  Administered 2022-02-07 (×2): 80 ug via INTRAVENOUS

## 2022-02-07 MED ORDER — AMISULPRIDE (ANTIEMETIC) 5 MG/2ML IV SOLN: 10.0000 mg | Freq: Once | INTRAVENOUS | Status: DC | PRN

## 2022-02-07 MED ORDER — DEXTROSE 50 % IV SOLN
25.0000 mL | Freq: Once | INTRAVENOUS | Status: AC
  Administered 2022-02-07: 25 mL via INTRAVENOUS
  Filled 2022-02-07: qty 50

## 2022-02-07 MED ORDER — PROPOFOL 10 MG/ML IV BOLUS
INTRAVENOUS | Status: DC | PRN
  Administered 2022-02-07: 20 mg via INTRAVENOUS
  Administered 2022-02-07: 30 mg via INTRAVENOUS

## 2022-02-07 MED ORDER — LACTATED RINGERS IV SOLN: INTRAVENOUS | Status: DC

## 2022-02-07 MED ORDER — PROPOFOL 500 MG/50ML IV EMUL
INTRAVENOUS | Status: DC | PRN
  Administered 2022-02-07: 85 ug/kg/min via INTRAVENOUS

## 2022-02-07 MED ORDER — ORAL CARE MOUTH RINSE: 15.0000 mL | Freq: Once | OROMUCOSAL | Status: AC

## 2022-02-07 MED ORDER — BUPIVACAINE HCL (PF) 0.25 % IJ SOLN
INTRAMUSCULAR | Status: DC | PRN
  Administered 2022-02-07: 10 mL

## 2022-02-07 MED ORDER — PHENYLEPHRINE 80 MCG/ML (10ML) SYRINGE FOR IV PUSH (FOR BLOOD PRESSURE SUPPORT)
PREFILLED_SYRINGE | INTRAVENOUS | Status: AC
  Filled 2022-02-07: qty 10

## 2022-02-07 MED ORDER — VANCOMYCIN HCL 500 MG IV SOLR
INTRAVENOUS | Status: DC | PRN
  Administered 2022-02-07: 500 mg via TOPICAL

## 2022-02-07 SURGICAL SUPPLY — 32 items
BLADE LONG MED 31X9 (MISCELLANEOUS) IMPLANT
BNDG ELASTIC 4X5.8 VLCR STR LF (GAUZE/BANDAGES/DRESSINGS) IMPLANT
BNDG GAUZE DERMACEA FLUFF 4 (GAUZE/BANDAGES/DRESSINGS) IMPLANT
COVER SURGICAL LIGHT HANDLE (MISCELLANEOUS) ×1 IMPLANT
CUFF TOURN SGL QUICK 24 (TOURNIQUET CUFF)
CUFF TRNQT CYL 24X4X16.5-23 (TOURNIQUET CUFF) IMPLANT
DRAPE SURG 17X23 STRL (DRAPES) ×1 IMPLANT
GAUZE PACKING IODOFORM 1/4X15 (PACKING) IMPLANT
GAUZE PAD ABD 8X10 STRL (GAUZE/BANDAGES/DRESSINGS) IMPLANT
GAUZE SPONGE 2X2 8PLY STRL LF (GAUZE/BANDAGES/DRESSINGS) IMPLANT
GAUZE SPONGE 4X4 12PLY STRL (GAUZE/BANDAGES/DRESSINGS) IMPLANT
GAUZE XEROFORM 1X8 LF (GAUZE/BANDAGES/DRESSINGS) IMPLANT
GLOVE BIOGEL M 7.0 STRL (GLOVE) ×1 IMPLANT
GLOVE PI ORTHO PRO STRL 7.5 (GLOVE) ×1 IMPLANT
GOWN STRL REUS W/ TWL LRG LVL3 (GOWN DISPOSABLE) ×2 IMPLANT
GOWN STRL REUS W/TWL LRG LVL3 (GOWN DISPOSABLE) ×2
KIT BASIN OR (CUSTOM PROCEDURE TRAY) ×1 IMPLANT
KIT TURNOVER KIT B (KITS) ×1 IMPLANT
NDL HYPO 25GX1X1/2 BEV (NEEDLE) IMPLANT
NEEDLE HYPO 25GX1X1/2 BEV (NEEDLE) IMPLANT
NS IRRIG 1000ML POUR BTL (IV SOLUTION) ×1 IMPLANT
PACK ORTHO EXTREMITY (CUSTOM PROCEDURE TRAY) ×1 IMPLANT
PAD ARMBOARD 7.5X6 YLW CONV (MISCELLANEOUS) ×2 IMPLANT
SOL PREP POV-IOD 4OZ 10% (MISCELLANEOUS) ×3 IMPLANT
SPECIMEN JAR SMALL (MISCELLANEOUS) ×1 IMPLANT
SUT ETHILON 3 0 PS 1 (SUTURE) ×1 IMPLANT
SYR CONTROL 10ML LL (SYRINGE) IMPLANT
TOWEL GREEN STERILE (TOWEL DISPOSABLE) ×1 IMPLANT
TOWEL GREEN STERILE FF (TOWEL DISPOSABLE) ×1 IMPLANT
TUBE CONNECTING 12X1/4 (SUCTIONS) IMPLANT
UNDERPAD 30X36 HEAVY ABSORB (UNDERPADS AND DIAPERS) ×1 IMPLANT
WATER STERILE IRR 1000ML POUR (IV SOLUTION) ×1 IMPLANT

## 2022-02-07 NOTE — Op Note (Signed)
Patient Name: [name] DOB: 1960-11-25  MRN: [id number]   Date of Service: 02/06/2022 - 02/07/2022  Surgeon: Dr. Lanae Crumbly, DPM Assistants: None Pre-operative Diagnosis:  Abscess of left foot Osteomyelitis left foot Postoperative infection Post-operative Diagnosis:  Abscess of left foot Osteomyelitis left foot Postoperative infection Procedures:  1) amputation of the left fourth toe at metatarsophalangeal joint  2) I&D of abscess tendon sheath with wide debridement in multiple fascial planes  Pathology/Specimens: ID Type Source Tests Collected by Time Destination  1 : LEFT FOURTH TOE Tissue Soft Tissue, Other SURGICAL PATHOLOGY Criselda Peaches, DPM 02/07/2022 1812   A : DEEP TISSUE CULTURE LEFT FOOT Tissue Soft Tissue, Other AEROBIC/ANAEROBIC CULTURE W GRAM STAIN (SURGICAL/DEEP WOUND) Criselda Peaches, DPM 02/07/2022 1806    Anesthesia:  IV sedation Hemostasis: * No tourniquets in log * Estimated Blood Loss: 1 mL Materials: * No implants in log * Medications: 10cc 0.25% marcaine plain Complications: no complications noted  Indications for Procedure:  This is a 62 y.o. male with a history of type 2 diabetes, prior right foot transmetatarsal amputation and deformity of the left foot, he underwent partial amputation of the left fourth toe to correct digital contracture and excise an ulcerative lesion on the plantar medial portion of the fourth toe on 01/24/2022.  He developed a postoperative infection.  He was unresponsive to oral antibiotics.  He was admitted for IV antibiotics and surgical intervention.  He presents for this today.  All risks and benefits of the procedure were discussed with the patient prior to surgery.  No guarantees were made.  I discussed with him the likelihood of return to the OR for further surgery following this.  All questions were addressed, informed consent was signed and reviewed.   Procedure in Detail: Patient was identified in pre-operative  holding area. Formal consent was signed and the left lower extremity was marked. Patient was brought back to the operating room. Anesthesia was induced. The extremity was prepped and draped in the usual sterile fashion. Timeout was taken to confirm patient name, laterality, and procedure prior to incision.   Attention was then directed to the left fourth toe which exhibited necrosis and purulent drainage from the incision.  I began by amputation of the toe by circumscribing the base of the toe with an incision using a scalpel.  This was carried down to the level of bone and the metatarsal phalangeal joint.  The soft tissue attachments of the metatarsal phalangeal joint were released and the remainder of the toe was excised.  A culture of the purulent drainage was taken and the toe was sent for pathology.  Unfortunately the abscess continued to extend further proximally with a tension and necrosis of the dorsal and plantar soft tissue surrounding the metatarsal.  The metatarsal itself appeared to be healthy.  I continued wide debridement and incision and drainage of multiple fascial planes above and below the deep fascia and along the flexor tendon sheath in the plantar vault and dorsal soft tissues.  Once full-thickness excisional debridement was complete with a scalpel and rongeur I irrigated it thoroughly with a liter of normal sterile saline with a pulse irrigator.  Hemostasis was achieved.  It was partially closed with 3-0 nylon, 500 mg of vancomycin powder was placed into the wound bed and it was packed open with iodinated gauze.  Patient tolerated the procedure well.   Disposition: Following a period of post-operative monitoring, patient will be transferred to the floor.  This will  be the first of a staged procedure that will require return to the operating room for further surgery possible metatarsal head resection.Marland Kitchen

## 2022-02-07 NOTE — Anesthesia Procedure Notes (Signed)
Procedure Name: MAC Date/Time: 02/07/2022 5:48 PM  Performed by: Maude Leriche, CRNAPre-anesthesia Checklist: Patient identified, Emergency Drugs available, Suction available, Patient being monitored and Timeout performed Patient Re-evaluated:Patient Re-evaluated prior to induction Oxygen Delivery Method: Simple face mask Preoxygenation: Pre-oxygenation with 100% oxygen Induction Type: IV induction Placement Confirmation: positive ETCO2 Dental Injury: Teeth and Oropharynx as per pre-operative assessment

## 2022-02-07 NOTE — Anesthesia Postprocedure Evaluation (Signed)
Anesthesia Post Note  Patient: [name]  Procedure(s) Performed: AMPUTATION TOE (Left: Toe)     Patient location during evaluation: PACU Anesthesia Type: MAC Level of consciousness: awake and alert Pain management: pain level controlled Vital Signs Assessment: post-procedure vital signs reviewed and stable Respiratory status: spontaneous breathing, nonlabored ventilation and respiratory function stable Cardiovascular status: stable and blood pressure returned to baseline Anesthetic complications: no   No notable events documented.  Last Vitals:  Vitals:   02/07/22 1915 02/07/22 1930  BP: 130/80 135/79  Pulse: 92 91  Resp: 16 12  Temp:  37.4 C  SpO2: 96% 96%                    Audry Pili

## 2022-02-07 NOTE — Transfer of Care (Signed)
Immediate Anesthesia Transfer of Care Note  Patient: [name]  Procedure(s) Performed: AMPUTATION TOE (Left: Toe)  Patient Location: PACU  Anesthesia Type:MAC  Level of Consciousness: awake, alert , and oriented  Airway & Oxygen Therapy: Patient Spontanous Breathing  Post-op Assessment: Report given to RN, Post -op Vital signs reviewed and stable, Patient moving all extremities X 4, and Patient able to stick tongue midline  Post vital signs: Reviewed  Last Vitals:  Vitals Value Taken Time  BP 108/63   Temp 99.3   Pulse 93 02/07/22 1827  Resp 21 02/07/22 1827  SpO2 93 % 02/07/22 1827  Vitals shown include unvalidated device data.  Last Pain:  Vitals:   02/07/22 1737  TempSrc: Oral  PainSc: 5       Patients Stated Pain Goal: 2 (66/06/00 4599)  Complications: No notable events documented.

## 2022-02-07 NOTE — Progress Notes (Signed)
PHARMACY ANTIBIOTIC CONSULT NOTE   [name] a 62 y.o. male admitted with a diabetic foot infection. He has had 7 different procedures (total) of both his R and L foot. He presented with a possible surgical site infection following a L partal fourth toe amputation. Pharmacy has been consulted for daptomycin and meropenem dosing.   Patient will be going to the OR 1/19 for toe amputation.   Estimated Creatinine Clearance: 100 mL/min (by C-G formula based on SCr of 0.85 mg/dL).  Plan: START Daptomycin 700 mg IV Q24H  START Meropenem 1g IV Q8H (starting after operation)  F/U OR C/S, surgery plans, renal function, CK weekly, de-escalation   Allergies:  Allergies  Allergen Reactions   Latex Rash and Other (See Comments)    Redness     Filed Weights   02/06/22 1103  Weight: 85.3 kg (188 lb)       Latest Ref Rng & Units 02/07/2022    4:19 AM 02/06/2022   11:13 AM 05/27/2021    1:45 AM  CBC  WBC 4.0 - 10.5 K/uL 11.6  13.5  13.0   Hemoglobin 13.0 - 17.0 g/dL 10.1  10.4  8.3   Hematocrit 39.0 - 52.0 % 31.5  32.6  25.9   Platelets 150 - 400 K/uL 357  373  397     Antibiotics Given (last 72 hours)     Date/Time Action Medication Dose Rate   02/06/22 1712 New Bag/Given   cefTRIAXone (ROCEPHIN) 2 g in sodium chloride 0.9 % 100 mL IVPB 2 g 200 mL/hr   02/06/22 1717 New Bag/Given   metroNIDAZOLE (FLAGYL) IVPB 500 mg 500 mg 100 mL/hr   02/07/22 0538 New Bag/Given   cefTRIAXone (ROCEPHIN) 2 g in sodium chloride 0.9 % 100 mL IVPB 2 g 200 mL/hr   02/07/22 0700 New Bag/Given   metroNIDAZOLE (FLAGYL) IVPB 500 mg 500 mg 100 mL/hr       Antimicrobials this admission: Ceftriaxone/flagyl 1/18>>1/19 Daptomycin 1/19>>c Meropenem 1/19>>c   Microbiology results: 05/24/21 R foot bone cx: ESBL E coli, E faecalis, Bacteroides caccae (beta-lactamase positive)   1/18 Bcx: NGTD   Thank you for allowing pharmacy to be a part of this patient's care.  Adria Dill, PharmD PGY-2 Infectious  Diseases Resident  02/07/2022 10:10 AM

## 2022-02-07 NOTE — Brief Op Note (Signed)
02/07/2022  6:23 PM  PATIENT:  [name]  62 y.o. male  PRE-OPERATIVE DIAGNOSIS:  Left 4th toe infection, dehisence  POST-OPERATIVE DIAGNOSIS:  Left 4th toe infection, dehisence  PROCEDURE:  Procedure(s) with comments: AMPUTATION TOE (Left) - amputation of remainder of Left 4th toe  SURGEON:  Surgeon(s) and Role:    * Shyanne Mcclary, Stephan Minister, DPM - Primary  PHYSICIAN ASSISTANT:   ASSISTANTS: none   ANESTHESIA:   local and MAC  EBL:  1 mL   BLOOD ADMINISTERED:none  DRAINS: none   LOCAL MEDICATIONS USED:  MARCAINE    and Amount: 10 ml, 596m vancomycin powder  SPECIMEN:  deep tissue culture, 4th toe path  DISPOSITION OF SPECIMEN:  PATHOLOGY, micro  COUNTS:  YES  TOURNIQUET:  * No tourniquets in log *  DICTATION: .Note written in EPIC  PLAN OF CARE: Admit to inpatient   PATIENT DISPOSITION:  PACU - hemodynamically stable.   Delay start of Pharmacological VTE agent (>24hrs) due to surgical blood loss or risk of bleeding: no  - Gross necrosis and deep abscess, partial closure and packed open. - will need return to OR for closure, further met head resection - Perfusion poor at toe. Suspect due to infection. New ABI ordered - WB in post op shoe to heel for now

## 2022-02-07 NOTE — Anesthesia Preprocedure Evaluation (Signed)
Anesthesia Evaluation  Patient identified by MRN, date of birth, ID band Patient awake    Reviewed: Allergy & Precautions, NPO status , Patient's Chart, lab work & pertinent test results  Airway Mallampati: II  TM Distance: >3 FB Neck ROM: Full    Dental  (+) Dental Advisory Given   Pulmonary former smoker   breath sounds clear to auscultation       Cardiovascular hypertension, Pt. on medications  Rhythm:Regular Rate:Normal     Neuro/Psych negative neurological ROS     GI/Hepatic Neg liver ROS, PUD,GERD  Medicated,,  Endo/Other  diabetes, Type 2, Insulin Dependent    Renal/GU Renal disease     Musculoskeletal  (+) Arthritis ,    Abdominal   Peds  Hematology  (+) Blood dyscrasia, anemia   Anesthesia Other Findings   Reproductive/Obstetrics                             Anesthesia Physical Anesthesia Plan  ASA: 3  Anesthesia Plan: MAC   Post-op Pain Management: Tylenol PO (pre-op)* and Gabapentin PO (pre-op)*   Induction:   PONV Risk Score and Plan: 1 and Propofol infusion and Ondansetron  Airway Management Planned: Natural Airway and Simple Face Mask  Additional Equipment:   Intra-op Plan:   Post-operative Plan:   Informed Consent: I have reviewed the patients History and Physical, chart, labs and discussed the procedure including the risks, benefits and alternatives for the proposed anesthesia with the patient or authorized representative who has indicated his/her understanding and acceptance.       Plan Discussed with: CRNA  Anesthesia Plan Comments:        Anesthesia Quick Evaluation

## 2022-02-07 NOTE — Consult Note (Addendum)
[facility] for Infectious Diseases                                                                                        Patient Identification: Patient Name: [name] MRN: [id number] [place] Date: 02/06/2022 10:56 AM Today's Date: 02/07/2022 Reason for consult: Diabetic Foot Infection Requesting provider: Dr Cruzita Lederer  Principal Problem:   Diabetic foot infection [facility]) Active Problems:   Controlled IDDM-2 with hyperglycemia   Chronic pain   HTN (hypertension)   Anxiety   Abnormal surgical wound   Spinal stenosis of lumbar region   Microcytic anemia   Hypokalemia   Antibiotics:  Ceftriaxone 1/18-c Metronidazole 1/18-c  Lines/Hardware: No known  Assessment 62 y o male with PMH as below including 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  Fu OR note from Podiatry, please send cultures and path as appropriate  Fu wound cx left toe 1/18 from Podiatry office Added on ESR and CRP  Fu Vascular ABI Monitor CPK Following  Rest of the management as per the primary team. Please call with questions or concerns.  Thank you for the consult  Rosiland Oz, MD Infectious Disease Physician [facility] for Infectious Disease [address] [place], [place] [address] Phone: [phone]  Fax: [phone]  __________________________________________________________________________________________________________ HPI and Hospital Course: 62 y o male with PMH as below including DM w neuropathy. RT TMA  followed by revision in May 2023 who was sent by podiatry due to concern of left foot infection.  Patient had distal left partial fourth digit amputation by Podiatry on 1/4 however continued to have worsening pain at the surgical site.  Seen on podiatry by 1/1 who felt patient had developed acute surgical site infection and was prescribed cephalexin 500 mg 4 times daily with recommendation to return in a week.  On follow-up visit on 1/18 he was having worsening symptoms with nausea and was recommended to come to the ED. Per ED note , culture of drainage was taken and all sutures were removed in the office.   At ED, afebrile Labs remarkable for k 3.3, WBC 13.5 Imaging as below  ROS: General- Denies loss of appetite and loss of weight, chills + HEENT - Denies headache, blurry vision, neck pain, sinus pain Chest - Denies any chest pain, SOB or cough CVS- Denies any dizziness/lightheadedness, syncopal attacks, palpitations Abdomen- Denies any abdominal pain, hematochezia and diarrhea, nausea + Neuro - Denies any weakness, numbness, tingling sensation Psych - Denies any changes in mood irritability or depressive symptoms GU- Denies any burning, dysuria, hematuria or increased frequency of urination Skin - denies any rashes/lesions MSK - Left foot pain at the surgical site  Past Medical History:  Diagnosis Date  Acute biliary pancreatitis 10/23/2013   Acute osteomyelitis of right foot ([place]) 05/22/2021   AKI (acute kidney injury) ([place]) 05/22/2021   Arthritis    Biliary colic 48/18/5631   Chronic osteomyelitis of toe of right foot ([place])    Diabetes mellitus    GERD (gastroesophageal reflux disease)    Headache(784.0)    History of kidney stones    Hypertension    Kidney stones    Neuropathy    Osteomyelitis of fourth toe of right foot ([place]) 06/16/2019   Past Surgical History:  Procedure Laterality Date   ACHILLES TENDON SURGERY Right 05/26/2021   Procedure: ACHILLES LENGTHENING/KIDNER;   Surgeon: Criselda Peaches, DPM;  Location: [facility];  Service: Podiatry;  Laterality: Right;   AMPUTATION TOE Right 06/21/2019   Procedure: AMPUTATION TOE;  Surgeon: Aviva Signs, MD;  Location: [facility];  Service: General;  Laterality: Right;   AMPUTATION TOE Right 08/29/2020   Procedure: RIGHT THIRD AMPUTATION TOE;  Surgeon: Aviva Signs, MD;  [facility];  Service: General;  Laterality: Right;  third toe   AMPUTATION TOE Right 02/20/2021   Procedure: AMPUTATION  TRANSMATARSAL  RIGHT; GREAT TOE;  Surgeon: Aviva Signs, MD;  Location: [facility];  Service: General;  Laterality: Right;   BACK SURGERY  2022   CARDIAC CATHETERIZATION  01/21/2007   CHOLECYSTECTOMY N/A 10/24/2013   Procedure: LAPAROSCOPIC CHOLECYSTECTOMY;  Surgeon: Jamesetta So, MD;  Location: [facility];  Service: General;  Laterality: N/A;   COLONOSCOPY N/A 03/31/2013   Procedure: COLONOSCOPY;  Surgeon: Rogene Houston, MD;  [facility];  Service: Endoscopy;  Laterality: N/A;  [place] Right 04/19/2021   Procedure: IRRIGATION AND DEBRIDEMENT RIGHT SECOND TOE;  Surgeon: Aviva Signs, MD;  Location[facility];  Service: General;  Laterality: Right;   SECONDARY CLOSURE OF WOUND Right 04/19/2021   Procedure: IRRIGATION AND DEBRIDEMENT SECONDARY CLOSURE OF RIGHT GREAT TOE WOUND;  Surgeon: Aviva Signs, MD;  Location[facility];  Service: General;  Laterality: Right;   TRANSMETATARSAL AMPUTATION Right 05/24/2021   Procedure: TRANSMETATARSAL AMPUTATION;  Surgeon: Criselda Peaches, DPM;  Location: [facility];  Service: Podiatry;  Laterality: Right;   TRANSMETATARSAL AMPUTATION Right 05/26/2021   Procedure: TRANSMETATARSAL AMPUTATION Revision;  Surgeon: Criselda Peaches, DPM;  Location: [facility];  Service: Podiatry;  Laterality: Right;     Scheduled Meds:  amLODipine  5 mg Oral Daily   aspirin EC  81 mg Oral Q breakfast   atorvastatin  20 mg Oral Daily   buPROPion  300 mg Oral Daily   diltiazem  420 mg Oral Daily    furosemide  20 mg Oral Daily   gabapentin  800 mg Oral TID   insulin aspart  0-15 Units Subcutaneous TID WC   insulin glargine-yfgn  30 Units Subcutaneous BID   losartan  100 mg Oral Daily   pantoprazole  40 mg Oral Daily   sodium chloride flush  3 mL Intravenous Q12H   Continuous Infusions:  cefTRIAXone (ROCEPHIN)  IV 2 g (02/07/22 0538)   And   metronidazole 500 mg (02/07/22 0700)   PRN Meds:.acetaminophen **OR** acetaminophen, ALPRAZolam, aspirin-acetaminophen-caffeine, HYDROcodone-acetaminophen, HYDROmorphone (DILAUDID) injection, polyethylene glycol  Allergies  Allergen Reactions   Latex Rash and Other (See Comments)    Redness    Social History   Socioeconomic History   Marital status: Divorced    Spouse name: Not on file   Number of children: Not on file   Years of  education: Not on file   Highest education level: Not on file  Occupational History   Not on file  Tobacco Use   Smoking status: Former    Packs/day: 1.00    Years: 7.00    Total pack years: 7.00    Types: Cigarettes    Quit date: 01/30/1982    Years since quitting: 40.0   Smokeless tobacco: Never  Vaping Use   Vaping Use: Never used  Substance and Sexual Activity   Alcohol use: No   Drug use: No   Sexual activity: Not on file  Other Topics Concern   Not on file  Social History Narrative   Not on file   Social Determinants of Health   Financial Resource Strain: Not on file  Food Insecurity: Not on file  Transportation Needs: Not on file  Physical Activity: Not on file  Stress: Not on file  Social Connections: Not on file  Intimate Partner Violence: Not on file   Family History  Problem Relation Age of Onset   Diabetes Mother    Heart failure Mother    Heart disease Mother    Heart failure Father    Vitals BP (!) 178/92 (BP Location: Left Arm)   Pulse 100   Temp 98.4 F (36.9 C) (Oral)   Resp 18   Ht '5\' 9"'$  (1.753 m)   Wt 85.3 kg   SpO2 100%   BMI 27.76 kg/m   Physical  Exam Constitutional:  lying in the bed and appears uncomfortable due to pain     Comments:   Cardiovascular:     Rate and Rhythm: Normal rate and regular rhythm.     Heart sounds:s1s2   Pulmonary:     Effort: Pulmonary effort is normal.     Comments: Normal breath sounds   Abdominal:     Palpations: Abdomen is soft.     Tenderness: non distended and non tender  Musculoskeletal:        General: No swelling or tenderness. Left foot with bandage C/D/I  Skin:    Comments: No rashes  Neurological:     General: grossly non focal, awake, alert and oriented  Psychiatric:        Mood and Affect: Mood normal.    Pertinent Microbiology Results for orders placed or performed during the hospital encounter of 02/06/22  Blood culture (routine x 2)     Status: None (Preliminary result)   Collection Time: 02/06/22 11:25 AM   Specimen: BLOOD  Result Value Ref Range Status   Specimen Description BLOOD BLOOD RIGHT FOREARM  Final   Special Requests   Final    BOTTLES DRAWN AEROBIC AND ANAEROBIC Blood Culture adequate volume   Culture   Final    NO GROWTH < 24 HOURS Performed at [facility], [address], [place], [place] [address]    Report Status PENDING  Incomplete  Blood culture (routine x 2)     Status: None (Preliminary result)   Collection Time: 02/06/22 11:27 AM   Specimen: BLOOD  Result Value Ref Range Status   Specimen Description BLOOD BLOOD LEFT FOREARM  Final   Special Requests   Final    BOTTLES DRAWN AEROBIC AND ANAEROBIC Blood Culture adequate volume   Culture   Final    NO GROWTH < 24 HOURS Performed at [facility], [place] [address], [place],  [address]    Report Status PENDING  Incomplete   Pertinent Lab seen by me:  Latest Ref Rng & Units 02/07/2022    4:19 AM 02/06/2022   11:13 AM 05/27/2021    1:45 AM  CBC  WBC 4.0 - 10.5 K/uL 11.6  13.5  13.0   Hemoglobin 13.0 - 17.0 g/dL 10.1  10.4  8.3   Hematocrit 39.0 - 52.0 % 31.5  32.6  25.9    Platelets 150 - 400 K/uL 357  373  397       Latest Ref Rng & Units 02/07/2022    4:19 AM 02/06/2022   11:13 AM 05/27/2021    1:45 AM  CMP  Glucose 70 - 99 mg/dL 127  87  309   BUN 6 - 20 mg/dL '12  12  12   '$ Creatinine 0.61 - 1.24 mg/dL 0.85  0.79  0.82   Sodium 135 - 145 mmol/L 138  137  135   Potassium 3.5 - 5.1 mmol/L 3.8  3.3  4.1   Chloride 98 - 111 mmol/L 102  101  106   CO2 22 - 32 mmol/L '27  25  23   '$ Calcium 8.9 - 10.3 mg/dL 8.7  9.0  8.6   Total Protein 6.5 - 8.1 g/dL  6.6    Total Bilirubin 0.3 - 1.2 mg/dL  0.4    Alkaline Phos 38 - 126 U/L  120    AST 15 - 41 U/L  20    ALT 0 - 44 U/L  27       Pertinent Imagings/Other Imagings Plain films and CT images have been personally visualized and interpreted; radiology reports have been reviewed. Decision making incorporated into the Impression / Recommendations.  DG Foot Complete Left  Result Date: 02/06/2022 CLINICAL DATA:  Left foot infection, recent digital amputation EXAM: LEFT FOOT - COMPLETE 3+ VIEW COMPARISON:  09/26/2021 FINDINGS: There is no evidence of fracture or dislocation. Status post distal digital amputation of the left fourth digit. Unchanged chronic bony erosion of the tufts of the left second and third digits. Diffuse soft tissue edema of the forefoot. Vascular calcinosis. IMPRESSION: 1. Status post distal digital amputation of the distal left fourth digit. 2. Unchanged chronic bony erosion of the tufts of the left second and third digits. 3. MRI is the test of choice for the evaluation of known or suspected osteomyelitis. 4. Diffuse soft tissue edema of the forefoot. Electronically Signed   By: Delanna Ahmadi M.D.   On: 02/06/2022 12:04     I spent 80 minutes for this patient encounter including review of prior medical records/discussing diagnostics and treatment plan with the patient/family/coordinate care with primary/other specialits with greater than 50% of time in face to face encounter.   Electronically  signed by:   Rosiland Oz, MD Infectious Disease Physician [facility] for Infectious Disease Pager: [phone]

## 2022-02-07 NOTE — Progress Notes (Signed)
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  He had a left partial fourth toe amputation 1 to 2 weeks ago, was placed on oral antibiotics but there was concern about him developing infection at the surgical site with increased redness, pain and he came back to the hospital for IV antibiotics and repeat surgery  Subjective / 24h Interval events: Complains of pain, awaiting surgery  Assesement and Plan: Principal Problem:   Diabetic foot infection ([place]) Active Problems:   Controlled IDDM-2 with hyperglycemia   Chronic pain   HTN (hypertension)   Anxiety   Abnormal surgical wound   Spinal stenosis of lumbar region   Microcytic anemia   Hypokalemia  Principal problem Diabetic foot infection, concern for osteomyelitis/cellulitis -on the left foot.  He has been having several surgeries to the right foot, now on the left.  Continue IV antibiotics.  Podiatry consulted, he will be taken to the OR this afternoon. -ID consulted as well, appreciate input  Active problems Type 2 diabetes mellitus -continue glargine, sliding scale.  Monitor CBGs  Essential hypertension-continue amlodipine, diltiazem, losartan  Hypokalemia-replete and continue to monitor  Anemia of chronic disease-monitor, no bleeding  History of anxiety-continue home medications  Chronic pain, spinal stenosis-continue home medications  Scheduled Meds:  amLODipine  5 mg Oral Daily   aspirin EC  81 mg Oral Q breakfast   atorvastatin  20 mg Oral Daily   buPROPion  300 mg Oral Daily   diltiazem  420 mg Oral Daily   furosemide  20 mg Oral Daily   gabapentin  800 mg Oral TID    insulin aspart  0-15 Units Subcutaneous TID WC   insulin glargine-yfgn  30 Units Subcutaneous BID   losartan  100 mg Oral Daily   pantoprazole  40 mg Oral Daily   sodium chloride flush  3 mL Intravenous Q12H   Continuous Infusions:  cefTRIAXone (ROCEPHIN)  IV 2 g (02/07/22 0538)   And   metronidazole 500 mg (02/07/22 0700)   DAPTOmycin (CUBICIN) 700 mg in sodium chloride 0.9 % IVPB     PRN Meds:.acetaminophen **OR** acetaminophen, ALPRAZolam, aspirin-acetaminophen-caffeine, HYDROcodone-acetaminophen, HYDROmorphone (DILAUDID) injection, polyethylene glycol  Current Outpatient Medications  Medication Instructions   acetaminophen (TYLENOL) 1,000 mg, Oral, Every 6 hours PRN   ALPRAZolam (XANAX) 0.5 mg, Oral, 3 times daily   amLODipine (NORVASC) 5 mg, Oral, Daily   aspirin EC 81 mg, Oral, Daily with breakfast   aspirin-acetaminophen-caffeine (EXCEDRIN MIGRAINE) 250-250-65 MG tablet 1 tablet, Oral, Every 6 hours PRN   atorvastatin (LIPITOR) 20 mg, Oral, Daily   buPROPion (WELLBUTRIN XL) 300 mg, Oral, Daily   cephALEXin (KEFLEX) 500 mg, Oral, 4 times daily   diclofenac Sodium (VOLTAREN) 2 g, Topical, Daily PRN   diltiazem (TIAZAC) 420 mg, Oral, Daily   DULoxetine (CYMBALTA) 60 mg, Oral, Daily   furosemide (LASIX) 20 mg, Oral, Daily   gabapentin (NEURONTIN) 800 mg, Oral, 3 times daily   insulin glargine (LANTUS) 80 Units, Subcutaneous, Daily   losartan (COZAAR) 100 mg, Oral, Daily   metFORMIN (GLUCOPHAGE) 1,000 mg, Oral, 2 times daily, In the morning & 1300   morphine (MSIR) 15 mg, Oral, Every 12 hours  Multiple Vitamin (MULTIVITAMIN WITH MINERALS) TABS tablet 1 tablet, Oral, Daily   oxyCODONE-acetaminophen (PERCOCET) 10-325 MG tablet 1 tablet, Oral, 4 times daily PRN   pantoprazole (PROTONIX) 40 MG tablet TAKE 1 TABLET(40 MG) BY MOUTH DAILY   sitaGLIPtin (JANUVIA) 100 mg, Oral, Daily   sulfamethoxazole-trimethoprim (BACTRIM DS) 800-160 MG tablet 1 tablet, Oral, 2 times daily    tiZANidine (ZANAFLEX) 4 mg, Oral, Every 8 hours PRN    Diet Orders (From admission, onward)     Start     Ordered   02/07/22 1015  Diet NPO time specified  Diet effective now        02/07/22 1014            DVT prophylaxis: SCDs Start: 02/06/22 1745   Lab Results  Component Value Date   PLT 357 02/07/2022      Code Status: Full Code  Family Communication: no family at bedside   Status is: Inpatient  Remains inpatient appropriate because: severity of illness  Level of care: Telemetry Surgical  Consultants:  Podiatry ID  Objective: Vitals:   02/06/22 2050 02/06/22 2200 02/07/22 0008 02/07/22 0801  BP: 139/75   (!) 178/92  Pulse: (!) 113   100  Resp: '14 12 18   '$ Temp: 98.8 F (37.1 C)   98.4 F (36.9 C)  TempSrc: Oral   Oral  SpO2: 98%   100%  Weight:      Height:        Intake/Output Summary (Last 24 hours) at 02/07/2022 1019 Last data filed at 02/06/2022 2110 Gross per 24 hour  Intake 240 ml  Output --  Net 240 ml   Wt Readings from Last 3 Encounters:  02/06/22 85.3 kg  05/22/21 90.7 kg  05/09/21 88 kg    Examination:  Constitutional: NAD Eyes: no scleral icterus ENMT: Mucous membranes are moist.  Neck: normal, supple Respiratory: clear to auscultation bilaterally, no wheezing, no crackles. Normal respiratory effort. No accessory muscle use.  Cardiovascular: Regular rate and rhythm, no murmurs / rubs / gallops. No LE edema.  Abdomen: non distended, no tenderness. Bowel sounds positive.  Musculoskeletal: no clubbing / cyanosis.  Skin: no rashes Neurologic: non focal   Data Reviewed: I have independently reviewed following labs and imaging studies   CBC Recent Labs  Lab 02/06/22 1113 02/07/22 0419  WBC 13.5* 11.6*  HGB 10.4* 10.1*  HCT 32.6* 31.5*  PLT 373 357  MCV 78.6* 78.4*  MCH 25.1* 25.1*  MCHC 31.9 32.1  RDW 15.9* 16.2*  LYMPHSABS 1.6  --   MONOABS 1.0  --   EOSABS 0.1  --   BASOSABS 0.1  --     Recent Labs  Lab  02/06/22 1113 02/06/22 1929 02/07/22 0419  NA 137  --  138  K 3.3*  --  3.8  CL 101  --  102  CO2 25  --  27  GLUCOSE 87  --  127*  BUN 12  --  12  CREATININE 0.79  --  0.85  CALCIUM 9.0  --  8.7*  AST 20  --   --   ALT 27  --   --   ALKPHOS 120  --   --   BILITOT 0.4  --   --   ALBUMIN 2.7*  --   --   MG  --  1.8  --   LATICACIDVEN 0.7 0.7  --     ------------------------------------------------------------------------------------------------------------------ No results for input(s): "CHOL", "HDL", "LDLCALC", "TRIG", "  CHOLHDL", "LDLDIRECT" in the last 72 hours.  Lab Results  Component Value Date   HGBA1C 6.7 (H) 05/22/2021   ------------------------------------------------------------------------------------------------------------------ No results for input(s): "TSH", "T4TOTAL", "T3FREE", "THYROIDAB" in the last 72 hours.  Invalid input(s): "FREET3"  Cardiac Enzymes No results for input(s): "CKMB", "TROPONINI", "MYOGLOBIN" in the last 168 hours.  Invalid input(s): "CK" ------------------------------------------------------------------------------------------------------------------ No results found for: "BNP"  CBG: Recent Labs  Lab 02/06/22 1900 02/07/22 0803  GLUCAP 84 139*    Recent Results (from the past 240 hour(s))  WOUND CULTURE     Status: None (Preliminary result)   Collection Time: 02/06/22 10:14 AM   Specimen: Abscess; Wound  Result Value Ref Range Status   MICRO NUMBER: [id number]  Preliminary   SPECIMEN QUALITY: Adequate  Preliminary   SOURCE: LEFT TOE  Preliminary   STATUS: PRELIMINARY  Preliminary   GRAM STAIN:   Preliminary    No white blood cells seen No epithelial cells seen Many Gram negative bacilli Few Gram positive cocci in pairs  Blood culture (routine x 2)     Status: None (Preliminary result)   Collection Time: 02/06/22 11:25 AM   Specimen: BLOOD  Result Value Ref Range Status   Specimen Description BLOOD BLOOD RIGHT FOREARM   Final   Special Requests   Final    BOTTLES DRAWN AEROBIC AND ANAEROBIC Blood Culture adequate volume   Culture   Final    NO GROWTH < 24 HOURS Performed at [facility], [address], [place], [place] [address]    Report Status PENDING  Incomplete  Blood culture (routine x 2)     Status: None (Preliminary result)   Collection Time: 02/06/22 11:27 AM   Specimen: BLOOD  Result Value Ref Range Status   Specimen Description BLOOD BLOOD LEFT FOREARM  Final   Special Requests   Final    BOTTLES DRAWN AEROBIC AND ANAEROBIC Blood Culture adequate volume   Culture   Final    NO GROWTH < 24 HOURS Performed at [facility], [place] [address], [place], [place] [address]    Report Status PENDING  Incomplete     Radiology Studies: DG Foot Complete Left  Result Date: 02/06/2022 CLINICAL DATA:  Left foot infection, recent digital amputation EXAM: LEFT FOOT - COMPLETE 3+ VIEW COMPARISON:  09/26/2021 FINDINGS: There is no evidence of fracture or dislocation. Status post distal digital amputation of the left fourth digit. Unchanged chronic bony erosion of the tufts of the left second and third digits. Diffuse soft tissue edema of the forefoot. Vascular calcinosis. IMPRESSION: 1. Status post distal digital amputation of the distal left fourth digit. 2. Unchanged chronic bony erosion of the tufts of the left second and third digits. 3. MRI is the test of choice for the evaluation of known or suspected osteomyelitis. 4. Diffuse soft tissue edema of the forefoot. Electronically Signed   By: Delanna Ahmadi M.D.   On: 02/06/2022 12:04     Marzetta Board, MD, PhD [facility]  Between 7 am - 7 pm I am available, please contact me via Amion (for emergencies) or Securechat (non urgent messages)  Between 7 pm - 7 am I am not available, please contact night coverage MD/APP via Amion

## 2022-02-07 NOTE — Consult Note (Signed)
Reason for Consult: Postoperative infection Referring Physician: Dr. Nida Boatman [name] is an 62 y.o. male.  HPI: He is well-known to me from previous right foot surgery and recent left fourth toe surgery.  I recommended admission yesterday after developing worsening postoperative infection unresponsive to outpatient antibiotics.  He said he is starting to feel a bit better pain is still there  Past Medical History:  Diagnosis Date   Acute biliary pancreatitis 10/23/2013   Acute osteomyelitis of right foot ([place]) 05/22/2021   AKI (acute kidney injury) ([place]) 05/22/2021   Arthritis    Biliary colic 53/61/4431   Chronic osteomyelitis of toe of right foot ([place])    Diabetes mellitus    GERD (gastroesophageal reflux disease)    Headache(784.0)    History of kidney stones    Hypertension    Kidney stones    Neuropathy    Osteomyelitis of fourth toe of right foot ([place]) 06/16/2019    Past Surgical History:  Procedure Laterality Date   ACHILLES TENDON SURGERY Right 05/26/2021   Procedure: ACHILLES LENGTHENING/KIDNER;  Surgeon: Criselda Peaches, DPM;  Location: [facility];  Service: Podiatry;  Laterality: Right;   AMPUTATION TOE Right 06/21/2019   Procedure: AMPUTATION TOE;  Surgeon: Aviva Signs, MD;  Location[facility];  Service: General;  Laterality: Right;   AMPUTATION TOE Right 08/29/2020   Procedure: RIGHT THIRD AMPUTATION TOE;  Surgeon: Aviva Signs, MD;  [facility];  Service: General;  Laterality: Right;  third toe   AMPUTATION TOE Right 02/20/2021   Procedure: AMPUTATION  TRANSMATARSAL  RIGHT; GREAT TOE;  Surgeon: Aviva Signs, MD;  Location[facility];  Service: General;  Laterality: Right;   BACK SURGERY  2022   CARDIAC CATHETERIZATION  01/21/2007   CHOLECYSTECTOMY N/A 10/24/2013   Procedure: LAPAROSCOPIC CHOLECYSTECTOMY;  Surgeon: Jamesetta So, MD;  Location: [facility];  Service: General;  Laterality: N/A;   COLONOSCOPY N/A 03/31/2013   Procedure: COLONOSCOPY;  Surgeon:  Rogene [place], MD;  [facility];  Service: Endoscopy;  Laterality: N/A;  [place] Right 04/19/2021   Procedure: IRRIGATION AND DEBRIDEMENT RIGHT SECOND TOE;  Surgeon: Aviva Signs, MD;  Location[facility];  Service: General;  Laterality: Right;   SECONDARY CLOSURE OF WOUND Right 04/19/2021   Procedure: IRRIGATION AND DEBRIDEMENT SECONDARY CLOSURE OF RIGHT GREAT TOE WOUND;  Surgeon: Aviva Signs, MD;  Location[facility];  Service: General;  Laterality: Right;   TRANSMETATARSAL AMPUTATION Right 05/24/2021   Procedure: TRANSMETATARSAL AMPUTATION;  Surgeon: Criselda Peaches, DPM;  Location: [facility];  Service: Podiatry;  Laterality: Right;   TRANSMETATARSAL AMPUTATION Right 05/26/2021   Procedure: TRANSMETATARSAL AMPUTATION Revision;  Surgeon: Criselda Peaches, DPM;  Location: [facility];  Service: Podiatry;  Laterality: Right;    Family History  Problem Relation Age of Onset   Diabetes Mother    Heart failure Mother    Heart disease Mother    Heart failure Father     Social History:  reports that he quit smoking about 40 years ago. His smoking use included cigarettes. He has a 7.00 pack-year smoking history. He has never used smokeless tobacco. He reports that he does not drink alcohol and does not use drugs.  Allergies:  Allergies  Allergen Reactions   Latex Rash and Other (See Comments)    Redness     Medications: I have reviewed the patient's current medications.  Results for orders placed or performed during the hospital  encounter of 02/06/22 (from the past 48 hour(s))  Lactic acid, plasma     Status: None   Collection Time: 02/06/22 11:13 AM  Result Value Ref Range   Lactic Acid, Venous 0.7 0.5 - 1.9 mmol/L    Comment: Performed at [facility], [address], [place], [place] [address]  Comprehensive metabolic panel     Status: Abnormal   Collection Time: 02/06/22 11:13 AM  Result Value Ref Range   Sodium 137 135 - 145 mmol/L   Potassium  3.3 (L) 3.5 - 5.1 mmol/L   Chloride 101 98 - 111 mmol/L   CO2 25 22 - 32 mmol/L   Glucose, Bld 87 70 - 99 mg/dL    Comment: Glucose reference range applies only to samples taken after fasting for at least 8 hours.   BUN 12 6 - 20 mg/dL   Creatinine, Ser 0.79 0.61 - 1.24 mg/dL   Calcium 9.0 8.9 - 10.3 mg/dL   Total Protein 6.6 6.5 - 8.1 g/dL   Albumin 2.7 (L) 3.5 - 5.0 g/dL   AST 20 15 - 41 U/L   ALT 27 0 - 44 U/L   Alkaline Phosphatase 120 38 - 126 U/L   Total Bilirubin 0.4 0.3 - 1.2 mg/dL   GFR, Estimated >60 >60 mL/min    Comment: (NOTE) Calculated using the CKD-EPI Creatinine Equation (2021)    Anion gap 11 5 - 15    Comment: Performed at [facility] [address], [place], [place] [address]  CBC with Differential     Status: Abnormal   Collection Time: 02/06/22 11:13 AM  Result Value Ref Range   WBC 13.5 (H) 4.0 - 10.5 K/uL   RBC 4.15 (L) 4.22 - 5.81 MIL/uL   Hemoglobin 10.4 (L) 13.0 - 17.0 g/dL   HCT 32.6 (L) 39.0 - 52.0 %   MCV 78.6 (L) 80.0 - 100.0 fL   MCH 25.1 (L) 26.0 - 34.0 pg   MCHC 31.9 30.0 - 36.0 g/dL   RDW 15.9 (H) 11.5 - 15.5 %   Platelets 373 150 - 400 K/uL   nRBC 0.0 0.0 - 0.2 %   Neutrophils Relative % 79 %   Neutro Abs 10.7 (H) 1.7 - 7.7 K/uL   Lymphocytes Relative 12 %   Lymphs Abs 1.6 0.7 - 4.0 K/uL   Monocytes Relative 7 %   Monocytes Absolute 1.0 0.1 - 1.0 K/uL   Eosinophils Relative 1 %   Eosinophils Absolute 0.1 0.0 - 0.5 K/uL   Basophils Relative 0 %   Basophils Absolute 0.1 0.0 - 0.1 K/uL   Immature Granulocytes 1 %   Abs Immature Granulocytes 0.15 (H) 0.00 - 0.07 K/uL    Comment: Performed at [facility] [address], [place], [place] [address]  Urinalysis, Routine w reflex microscopic Urine, Clean Catch     Status: Abnormal   Collection Time: 02/06/22 11:13 AM  Result Value Ref Range   Color, Urine AMBER (A) YELLOW    Comment: BIOCHEMICALS MAY BE AFFECTED BY COLOR   APPearance HAZY (A) CLEAR   Specific Gravity,  Urine 1.031 (H) 1.005 - 1.030   pH 5.0 5.0 - 8.0   Glucose, UA NEGATIVE NEGATIVE mg/dL   Hgb urine dipstick NEGATIVE NEGATIVE   Bilirubin Urine NEGATIVE NEGATIVE   Ketones, ur NEGATIVE NEGATIVE mg/dL   Protein, ur 100 (A) NEGATIVE mg/dL   Nitrite NEGATIVE NEGATIVE   Leukocytes,Ua NEGATIVE NEGATIVE   RBC /  HPF 0-5 0 - 5 RBC/hpf   WBC, UA 0-5 0 - 5 WBC/hpf   Bacteria, UA RARE (A) NONE SEEN   Squamous Epithelial / HPF 0-5 0 - 5 /HPF   Mucus PRESENT    Ca Oxalate Crys, UA PRESENT     Comment: Performed at [facility] [address], [place], [place] [address]  Reticulocytes     Status: Abnormal   Collection Time: 02/06/22 11:13 AM  Result Value Ref Range   Retic Ct Pct 1.3 0.4 - 3.1 %   RBC. 4.15 (L) 4.22 - 5.81 MIL/uL   Retic Count, Absolute 52.7 19.0 - 186.0 K/uL   Immature Retic Fract 33.5 (H) 2.3 - 15.9 %    Comment: Performed at [facility] [address] [place], [place] [address]  Blood culture (routine x 2)     Status: None (Preliminary result)   Collection Time: 02/06/22 11:25 AM   Specimen: BLOOD  Result Value Ref Range   Specimen Description BLOOD BLOOD RIGHT FOREARM    Special Requests      BOTTLES DRAWN AEROBIC AND ANAEROBIC Blood Culture adequate volume   Culture      NO GROWTH < 24 HOURS Performed at [facility], [place] [address], [place], [place] [address]    Report Status PENDING   Blood culture (routine x 2)     Status: None (Preliminary result)   Collection Time: 02/06/22 11:27 AM   Specimen: BLOOD  Result Value Ref Range   Specimen Description BLOOD BLOOD LEFT FOREARM    Special Requests      BOTTLES DRAWN AEROBIC AND ANAEROBIC Blood Culture adequate volume   Culture      NO GROWTH < 24 HOURS Performed at [facility], [place] [address], [place], [place] [address]    Report Status PENDING   Glucose, capillary     Status: None   Collection Time: 02/06/22  7:00 PM  Result Value Ref Range   Glucose-Capillary 84 70 - 99 mg/dL     Comment: Glucose reference range applies only to samples taken after fasting for at least 8 hours.  Lactic acid, plasma     Status: None   Collection Time: 02/06/22  7:29 PM  Result Value Ref Range   Lactic Acid, Venous 0.7 0.5 - 1.9 mmol/L    Comment: Performed at [facility], [place] [address] [place], [place] [address]  HIV Antibody (routine testing w rflx)     Status: None   Collection Time: 02/06/22  7:29 PM  Result Value Ref Range   HIV Screen 4th Generation wRfx Non Reactive Non Reactive    Comment: Performed at [facility], [place] [address] [place], [place] [address]  Iron and TIBC     Status: Abnormal   Collection Time: 02/06/22  7:29 PM  Result Value Ref Range   Iron 12 (L) 45 - 182 ug/dL   TIBC 300 250 - 450 ug/dL   Saturation Ratios 4 (L) 17.9 - 39.5 %   UIBC 288 ug/dL    Comment: Performed at [facility], [place] [address], [place], [place] [address]  Ferritin     Status: None   Collection Time: 02/06/22  7:29 PM  Result Value Ref Range   Ferritin 76 24 - 336 ng/mL    Comment: Performed at [facility] [address], [place], [place] [address]  Magnesium     Status:  None   Collection Time: 02/06/22  7:29 PM  Result Value Ref Range   Magnesium 1.8 1.7 - 2.4 mg/dL    Comment: Performed at [facility], [place] [address] [place], [place] [address]  Type and screen [place]     Status: None   Collection Time: 02/07/22  4:15 AM  Result Value Ref Range   ABO/RH(D) B POS    Antibody Screen NEG    Sample Expiration      02/10/2022,2359 Performed at [facility], [place] [address], [place], [place] [address]   Basic metabolic panel     Status: Abnormal   Collection Time: 02/07/22  4:19 AM  Result Value Ref Range   Sodium 138 135 - 145 mmol/L   Potassium 3.8 3.5 - 5.1 mmol/L   Chloride 102 98 - 111 mmol/L   CO2 27 22 - 32 mmol/L   Glucose, Bld 127 (H) 70 - 99 mg/dL    Comment: Glucose reference range applies only to  samples taken after fasting for at least 8 hours.   BUN 12 6 - 20 mg/dL   Creatinine, Ser 0.85 0.61 - 1.24 mg/dL   Calcium 8.7 (L) 8.9 - 10.3 mg/dL   GFR, Estimated >60 >60 mL/min    Comment: (NOTE) Calculated using the CKD-EPI Creatinine Equation (2021)    Anion gap 9 5 - 15    Comment: Performed at [facility] [address], [place], [place] [address]  CBC     Status: Abnormal   Collection Time: 02/07/22  4:19 AM  Result Value Ref Range   WBC 11.6 (H) 4.0 - 10.5 K/uL   RBC 4.02 (L) 4.22 - 5.81 MIL/uL   Hemoglobin 10.1 (L) 13.0 - 17.0 g/dL   HCT 31.5 (L) 39.0 - 52.0 %   MCV 78.4 (L) 80.0 - 100.0 fL   MCH 25.1 (L) 26.0 - 34.0 pg   MCHC 32.1 30.0 - 36.0 g/dL   RDW 16.2 (H) 11.5 - 15.5 %   Platelets 357 150 - 400 K/uL   nRBC 0.0 0.0 - 0.2 %    Comment: Performed at [facility], [place] [address], [place], [place] [address]  Glucose, capillary     Status: Abnormal   Collection Time: 02/07/22  8:03 AM  Result Value Ref Range   Glucose-Capillary 139 (H) 70 - 99 mg/dL    Comment: Glucose reference range applies only to samples taken after fasting for at least 8 hours.  Sedimentation rate     Status: Abnormal   Collection Time: 02/07/22  9:58 AM  Result Value Ref Range   Sed Rate 75 (H) 0 - 16 mm/hr    Comment: Performed at [facility] [address], [place], [place] [address]  C-reactive protein     Status: Abnormal   Collection Time: 02/07/22  9:58 AM  Result Value Ref Range   CRP 15.1 (H) <1.0 mg/dL    Comment: Performed at [facility] [address] [place], [place] [address]  CK     Status: None   Collection Time: 02/07/22  9:58 AM  Result Value Ref Range   Total CK 49 49 - 397 U/L    Comment: Performed at [facility], [place] [address] [place], [place] [address]  Glucose, capillary     Status: Abnormal   Collection Time: 02/07/22 11:41 AM  Result Value Ref Range   Glucose-Capillary 247 (  H) 70 - 99 mg/dL    Comment: Glucose reference  range applies only to samples taken after fasting for at least 8 hours.  Glucose, capillary     Status: Abnormal   Collection Time: 02/07/22  4:50 PM  Result Value Ref Range   Glucose-Capillary 65 (L) 70 - 99 mg/dL    Comment: Glucose reference range applies only to samples taken after fasting for at least 8 hours.    DG Foot Complete Left  Result Date: 02/06/2022 CLINICAL DATA:  Left foot infection, recent digital amputation EXAM: LEFT FOOT - COMPLETE 3+ VIEW COMPARISON:  09/26/2021 FINDINGS: There is no evidence of fracture or dislocation. Status post distal digital amputation of the left fourth digit. Unchanged chronic bony erosion of the tufts of the left second and third digits. Diffuse soft tissue edema of the forefoot. Vascular calcinosis. IMPRESSION: 1. Status post distal digital amputation of the distal left fourth digit. 2. Unchanged chronic bony erosion of the tufts of the left second and third digits. 3. MRI is the test of choice for the evaluation of known or suspected osteomyelitis. 4. Diffuse soft tissue edema of the forefoot. Electronically Signed   By: Delanna Ahmadi M.D.   On: 02/06/2022 12:04    Review of Systems  Constitutional:  Positive for fever (Had fevers PTA) and malaise/fatigue (PTA). Negative for chills.  Musculoskeletal:  Positive for joint pain (Left foot).   Blood pressure 136/79, pulse 93, temperature 98.7 F (37.1 C), temperature source Oral, resp. rate 15, height '5\' 9"'$  (1.753 m), weight 85.3 kg, SpO2 99 %.  Vitals:   02/07/22 0801 02/07/22 1534  BP: (!) 178/92 136/79  Pulse: 100 93  Resp: 19 15  Temp: 98.4 F (36.9 C) 98.7 F (37.1 C)  SpO2: 100% 99%    General AA&O x3. Normal mood and affect.  Vascular Dorsalis pedis and posterior tibial pulses  present 2+ left  Capillary refill normal to all digits. Pedal hair growth diminished.  Neurologic Epicritic sensation grossly absent.  Dermatologic (Wound) Cellulitis from incision to midfoot, necrosis at  incision site  Orthopedic: Motor intact BLE.    Assessment/Plan:  Left fourth toe abscess, osteomyelitis, cellulitis -Imaging: Studies independently reviewed -Antibiotics: Continue broad-spectrum antibiotics for diabetic foot infection -WB Status: WBAT in postop shoe -Surgical Plan: To OR today for toe amputation at MTPJ left fourth toe.  We discussed the risk and benefits of this.  Discussed possibility of leaving the incision open to drain further and delayed primary closure at a later date.  He understands the risk and wishes to proceed  Criselda Peaches 02/07/2022, 5:10 PM   Best available via secure chat for questions or concerns.

## 2022-02-08 ENCOUNTER — Encounter (HOSPITAL_COMMUNITY): Payer: Self-pay | Admitting: Podiatry

## 2022-02-08 ENCOUNTER — Encounter (HOSPITAL_COMMUNITY): Payer: Medicaid Other

## 2022-02-08 DIAGNOSIS — E11628 Type 2 diabetes mellitus with other skin complications: Secondary | ICD-10-CM | POA: Diagnosis not present

## 2022-02-08 DIAGNOSIS — L089 Local infection of the skin and subcutaneous tissue, unspecified: Secondary | ICD-10-CM | POA: Diagnosis not present

## 2022-02-08 LAB — COMPREHENSIVE METABOLIC PANEL
ALT: 21 U/L (ref 0–44)
AST: 18 U/L (ref 15–41)
Albumin: 2.2 g/dL — ABNORMAL LOW (ref 3.5–5.0)
Alkaline Phosphatase: 91 U/L (ref 38–126)
Anion gap: 11 (ref 5–15)
BUN: 13 mg/dL (ref 6–20)
CO2: 22 mmol/L (ref 22–32)
Calcium: 8.4 mg/dL — ABNORMAL LOW (ref 8.9–10.3)
Chloride: 104 mmol/L (ref 98–111)
Creatinine, Ser: 0.84 mg/dL (ref 0.61–1.24)
GFR, Estimated: >60 mL/min (ref >60)
Glucose, Bld: 188 mg/dL — ABNORMAL HIGH (ref 70–99)
Potassium: 3.7 mmol/L (ref 3.5–5.1)
Sodium: 137 mmol/L (ref 135–145)
Total Bilirubin: <0.1 mg/dL — ABNORMAL LOW (ref 0.3–1.2)
Total Protein: 5.4 g/dL — ABNORMAL LOW (ref 6.5–8.1)

## 2022-02-08 LAB — CBC
HCT: 30.4 % — ABNORMAL LOW (ref 39.0–52.0)
Hemoglobin: 9.7 g/dL — ABNORMAL LOW (ref 13.0–17.0)
MCH: 25 pg — ABNORMAL LOW (ref 26.0–34.0)
MCHC: 31.9 g/dL (ref 30.0–36.0)
MCV: 78.4 fL — ABNORMAL LOW (ref 80.0–100.0)
Platelets: 330 10*3/uL (ref 150–400)
RBC: 3.88 MIL/uL — ABNORMAL LOW (ref 4.22–5.81)
RDW: 16.3 % — ABNORMAL HIGH (ref 11.5–15.5)
WBC: 11.7 10*3/uL — ABNORMAL HIGH (ref 4.0–10.5)
nRBC: 0 % (ref 0.0–0.2)

## 2022-02-08 LAB — GLUCOSE, CAPILLARY
Glucose-Capillary: 112 mg/dL — ABNORMAL HIGH (ref 70–99)
Glucose-Capillary: 152 mg/dL — ABNORMAL HIGH (ref 70–99)
Glucose-Capillary: 153 mg/dL — ABNORMAL HIGH (ref 70–99)
Glucose-Capillary: 155 mg/dL — ABNORMAL HIGH (ref 70–99)
Glucose-Capillary: 156 mg/dL — ABNORMAL HIGH (ref 70–99)

## 2022-02-08 LAB — MAGNESIUM: Magnesium: 1.7 mg/dL (ref 1.7–2.4)

## 2022-02-08 MED ORDER — ONDANSETRON HCL 4 MG/2ML IJ SOLN
INTRAMUSCULAR | Status: AC
  Administered 2022-02-08: 4 mg
  Filled 2022-02-08: qty 2

## 2022-02-08 MED ORDER — HYDROMORPHONE HCL 1 MG/ML IJ SOLN
0.5000 mg | INTRAMUSCULAR | Status: DC | PRN
  Administered 2022-02-08 – 2022-02-10 (×14): 1 mg via INTRAVENOUS
  Administered 2022-02-10: 0.5 mg via INTRAVENOUS
  Administered 2022-02-10 (×2): 1 mg via INTRAVENOUS
  Administered 2022-02-10: 0.5 mg via INTRAVENOUS
  Administered 2022-02-10: 1 mg via INTRAVENOUS
  Administered 2022-02-10 – 2022-02-11 (×2): 0.5 mg via INTRAVENOUS
  Administered 2022-02-11: 1 mg via INTRAVENOUS
  Administered 2022-02-11 (×2): 0.5 mg via INTRAVENOUS
  Administered 2022-02-12 – 2022-02-14 (×14): 1 mg via INTRAVENOUS
  Filled 2022-02-08: qty 1
  Filled 2022-02-08: qty 0.5
  Filled 2022-02-08: qty 1
  Filled 2022-02-08: qty 0.5
  Filled 2022-02-08 (×7): qty 1
  Filled 2022-02-08: qty 0.5
  Filled 2022-02-08 (×24): qty 1
  Filled 2022-02-08: qty 0.5
  Filled 2022-02-08 (×2): qty 1

## 2022-02-08 MED ORDER — HYDROMORPHONE HCL 1 MG/ML IJ SOLN
1.0000 mg | Freq: Once | INTRAMUSCULAR | Status: AC
  Administered 2022-02-08: 1 mg via INTRAVENOUS
  Filled 2022-02-08: qty 1

## 2022-02-08 NOTE — Progress Notes (Signed)
Subjective:  Patient ID: [name], male    DOB: 16-Feb-1960,  MRN: [id number]  Patient seen at [facility] POD# 1 left fourth toe amputation and I&D  Overall doing OK, having pain, starting to feel better  Negative for chest pain and shortness of breath Fever: no Night sweats: no Constitutional signs: no Review of all other systems is negative Objective:   Vitals:   02/08/22 0600 02/08/22 0800  BP: (!) 155/81 (!) 140/74  Pulse: (!) 112 (!) 106  Resp: 14 15  Temp:  98.8 F (37.1 C)  SpO2: 97% 95%   General AA&O x3. Normal mood and affect.  Vascular Weakly palpable pedal pulses, capillary fill intact, some cool temp to 5th toe  Neurologic Epicritic sensation grossly reduced  Dermatologic Incision clean no purulence or malodor no new necrosis, pallor to 5th toe  Orthopedic: MMT 5/5 in dorsiflexion, plantarflexion, inversion, and eversion. Normal joint ROM without pain or crepitus.    Results for orders placed or performed during the hospital encounter of 02/06/22  Blood culture (routine x 2)     Status: None (Preliminary result)   Collection Time: 02/06/22 11:25 AM   Specimen: BLOOD  Result Value Ref Range Status   Specimen Description BLOOD BLOOD RIGHT FOREARM  Final   Special Requests   Final    BOTTLES DRAWN AEROBIC AND ANAEROBIC Blood Culture adequate volume   Culture   Final    NO GROWTH 2 DAYS Performed at [facility], [place] [address] [place], [place] [address]    Report Status PENDING  Incomplete  Blood culture (routine x 2)     Status: None (Preliminary result)   Collection Time: 02/06/22 11:27 AM   Specimen: BLOOD  Result Value Ref Range Status   Specimen Description BLOOD BLOOD LEFT FOREARM  Final   Special Requests   Final    BOTTLES DRAWN AEROBIC AND ANAEROBIC Blood Culture adequate volume   Culture   Final    NO GROWTH 2 DAYS Performed at [facility], [place] [address] [place], [place] [address]    Report Status PENDING  Incomplete   Surgical pcr screen     Status: None   Collection Time: 02/07/22  3:07 PM  Result Value Ref Range Status   MRSA, PCR NEGATIVE NEGATIVE Final   Staphylococcus aureus NEGATIVE NEGATIVE Final    Comment: (NOTE) The Xpert SA Assay (FDA approved for NASAL specimens in patients 30 years of age and older), is one component of a comprehensive surveillance program. It is not intended to diagnose infection nor to guide or monitor treatment. Performed at [facility], [place] [address] [place], [place] [address]   Aerobic/Anaerobic Culture w Gram Stain (surgical/deep wound)     Status: None (Preliminary result)   Collection Time: 02/07/22  6:06 PM   Specimen: Soft Tissue, Other  Result Value Ref Range Status   Specimen Description TISSUE LEFT FOOT  Final   Special Requests SWAB OF DEEP TISSUE PT ON DACTOMYCIN  Final   Gram Stain   Final    RARE WBC PRESENT, PREDOMINANTLY PMN MODERATE GRAM POSITIVE RODS RARE GRAM POSITIVE COCCI IN PAIRS    Culture   Final    FEW GRAM NEGATIVE RODS IDENTIFICATION AND SUSCEPTIBILITIES TO FOLLOW Performed at [facility], [place] [address], [place], [place] [address]    Report Status PENDING  Incomplete   CBC    Component Value Date/Time   WBC 11.7 (H) 02/08/2022 6712  RBC 3.88 (L) 02/08/2022 0338   HGB 9.7 (L) 02/08/2022 0338   HCT 30.4 (L) 02/08/2022 0338   PLT 330 02/08/2022 0338   MCV 78.4 (L) 02/08/2022 0338   MCH 25.0 (L) 02/08/2022 0338   MCHC 31.9 02/08/2022 0338   RDW 16.3 (H) 02/08/2022 0338   LYMPHSABS 1.6 02/06/2022 1113   MONOABS 1.0 02/06/2022 1113   EOSABS 0.1 02/06/2022 1113   BASOSABS 0.1 02/06/2022 1113    Assessment & Plan:  Patient was evaluated and treated and all questions answered.   -Discussed case w/ vascular lab and Dr Trula Slade from vascular surgery. New ABI unlikely helpful due calcified infrapop disease, recommended angiography, they will see and hopefully take for this Monday or Tuesday  -OR canceled for  tomorrow for now, we will plan to take back after vascular intervention -Dressing changed, pleased with control of infection currently. We discussed the 5th toe being cool, may become necrotic but will CTM -Will see and change tomorrow   Criselda Peaches, DPM  Accessible via secure chat for questions or concerns.

## 2022-02-08 NOTE — Progress Notes (Signed)
Orthopedic Tech Progress Note Patient Details:  ESTANISLADO SURGEON 1960-10-27 [id number]  PO shoe was applied to LLE to ensure proper fit, then removed per pt preference and placed at bedside for when OOB.  Ortho Devices Type of Ortho Device: Postop shoe/boot Ortho Device/Splint Location: LLE Ortho Device/Splint Interventions: Ordered, Application, Adjustment, Removal   Post Interventions Patient Tolerated: Well Instructions Provided: Care of device, Adjustment of device  Lamar Naef Jeri [place] 02/08/2022, 8:14 AM

## 2022-02-08 NOTE — Progress Notes (Signed)
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  He had a left partial fourth toe amputation 1 to 2 weeks ago, was placed on oral antibiotics but there was concern about him developing infection at the surgical site with increased redness, pain and he came back to the hospital for IV antibiotics and repeat surgery  Subjective / 24h Interval events: He is overall doing well today.  Complains of pain which seems to be worse than his prior surgeries in the past.  No abdominal pain, no nausea or vomiting.  Assesement and Plan: Principal Problem:   Diabetic foot infection ([place]) Active Problems:   Controlled IDDM-2 with hyperglycemia   Chronic pain   HTN (hypertension)   Anxiety   Abnormal surgical wound   Spinal stenosis of lumbar region   Microcytic anemia   Hypokalemia  Principal problem Diabetic foot infection, concern for osteomyelitis/cellulitis -on the left foot.  He has been having several surgeries to the right foot, now on the left.  Continue IV antibiotics.  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  He may require return to operative room for further surgery and possible metatarsal head resection -ID also consulted, currently on daptomycin and meropenem for now continue  Active problems Type 2 diabetes mellitus -continue glargine, sliding scale.  Monitor CBGs  CBG (last 3)  Recent Labs    02/07/22 1957 02/08/22 0855 02/08/22 1142  GLUCAP 102* 112* 152*    Essential hypertension-continue amlodipine, diltiazem, losartan  Hypokalemia-replete and continue to monitor  Anemia of chronic disease-monitor, no bleeding  History of anxiety-continue home medications  Chronic pain, spinal stenosis-continue home medications  Scheduled Meds:  amLODipine  5 mg Oral Daily   aspirin EC  81 mg Oral Q breakfast   atorvastatin  20 mg Oral Daily   buPROPion  300 mg Oral Daily   diltiazem  420 mg Oral Daily   furosemide  20 mg Oral Daily   gabapentin  800 mg Oral TID   insulin aspart  0-15 Units Subcutaneous TID WC   insulin glargine-yfgn  30 Units Subcutaneous BID   losartan  100 mg Oral Daily   pantoprazole  40 mg Oral Daily   sodium chloride flush  3 mL Intravenous Q12H   Continuous Infusions:  DAPTOmycin (CUBICIN) 700 mg in sodium chloride 0.9 % IVPB Stopped (02/07/22 1246)   meropenem (MERREM) IV 1 g (02/08/22 0613)   PRN Meds:.acetaminophen **OR** acetaminophen, ALPRAZolam, aspirin-acetaminophen-caffeine, HYDROcodone-acetaminophen, HYDROmorphone (DILAUDID) injection, polyethylene glycol  Current Outpatient Medications  Medication Instructions   ALPRAZolam (XANAX) 0.5 mg, Oral, 3 times daily   amLODipine (NORVASC) 5 mg, Oral, Daily   aspirin EC 81 mg, Oral, Daily with breakfast   aspirin-acetaminophen-caffeine (EXCEDRIN MIGRAINE) 250-250-65 MG tablet 1 tablet, Oral, Every 6 hours PRN   atorvastatin (LIPITOR) 20 mg, Oral, Daily   buPROPion (WELLBUTRIN XL) 300 mg, Oral, Daily   diclofenac Sodium (VOLTAREN) 2 g, Topical, Daily PRN   diltiazem (TIAZAC) 420 mg, Oral, Daily   DULoxetine (CYMBALTA) 60  mg, Oral, Daily   furosemide (LASIX) 20 mg, Oral, Daily   gabapentin (NEURONTIN) 800 mg, Oral, 3 times daily   insulin glargine (LANTUS) 80 Units, Subcutaneous, Daily   losartan (COZAAR) 100 mg, Oral, Daily   metFORMIN (GLUCOPHAGE) 1,000 mg, Oral, 2 times daily, In the morning & 1300   morphine (MSIR) 15 mg, Oral, Every 12 hours   Multiple  Vitamin (MULTIVITAMIN WITH MINERALS) TABS tablet 1 tablet, Oral, Daily   oxyCODONE-acetaminophen (PERCOCET) 10-325 MG tablet 1 tablet, Oral, 4 times daily PRN   pantoprazole (PROTONIX) 40 MG tablet TAKE 1 TABLET(40 MG) BY MOUTH DAILY   sitaGLIPtin (JANUVIA) 100 mg, Oral, Daily   sulfamethoxazole-trimethoprim (BACTRIM DS) 800-160 MG tablet 1 tablet, Oral, 2 times daily   tiZANidine (ZANAFLEX) 4 mg, Oral, Every 8 hours PRN    Diet Orders (From admission, onward)     Start     Ordered   02/07/22 1920  Diet Carb Modified Fluid consistency: Thin; Room service appropriate? Yes  Diet effective now       Question Answer Comment  Diet-HS Snack? Nothing   Calorie Level Medium 1600-2000   Fluid consistency: Thin   Room service appropriate? Yes      02/07/22 1919            DVT prophylaxis: SCDs Start: 02/06/22 1745   Lab Results  Component Value Date   PLT 330 02/08/2022      Code Status: Full Code  Family Communication: no family at bedside   Status is: Inpatient  Remains inpatient appropriate because: severity of illness  Level of care: Med-Surg  Consultants:  Podiatry ID  Objective: Vitals:   02/08/22 0200 02/08/22 0400 02/08/22 0600 02/08/22 0800  BP: 112/83 (!) 145/79 (!) 155/81 (!) 140/74  Pulse: (!) 109 (!) 104 (!) 112 (!) 106  Resp: 15 (!) '23 14 15  '$ Temp:    98.8 F (37.1 C)  TempSrc:    Oral  SpO2: 96% 95% 97% 95%  Weight:      Height:        Intake/Output Summary (Last 24 hours) at 02/08/2022 1230 Last data filed at 02/08/2022 0000 Gross per 24 hour  Intake 364.32 ml  Output 301 ml  Net 63.32 ml    Wt Readings from Last 3 Encounters:  02/06/22 85.3 kg  05/22/21 90.7 kg  05/09/21 88 kg    Examination:  Constitutional: NAD Eyes: lids and conjunctivae normal, no scleral icterus ENMT: mmm Neck: normal, supple Respiratory: clear to auscultation bilaterally, no wheezing, no crackles. Normal respiratory effort.  Cardiovascular: Regular rate and  rhythm, no murmurs / rubs / gallops. No LE edema. Abdomen: soft, no distention, no tenderness. Bowel sounds positive.  Skin: no rashes Neurologic: no focal deficits, equal strength   Data Reviewed: I have independently reviewed following labs and imaging studies   CBC Recent Labs  Lab 02/06/22 1113 02/07/22 0419 02/08/22 0338  WBC 13.5* 11.6* 11.7*  HGB 10.4* 10.1* 9.7*  HCT 32.6* 31.5* 30.4*  PLT 373 357 330  MCV 78.6* 78.4* 78.4*  MCH 25.1* 25.1* 25.0*  MCHC 31.9 32.1 31.9  RDW 15.9* 16.2* 16.3*  LYMPHSABS 1.6  --   --   MONOABS 1.0  --   --   EOSABS 0.1  --   --   BASOSABS 0.1  --   --      Recent Labs  Lab 02/06/22 1113 02/06/22 1929 02/07/22 0419 02/07/22 0958 02/08/22 0338  NA 137  --  138  --  137  K 3.3*  --  3.8  --  3.7  CL 101  --  102  --  104  CO2 25  --  27  --  22  GLUCOSE 87  --  127*  --  188*  BUN 12  --  12  --  13  CREATININE 0.79  --  0.85  --  0.84  CALCIUM 9.0  --  8.7*  --  8.4*  AST 20  --   --   --  18  ALT 27  --   --   --  21  ALKPHOS 120  --   --   --  91  BILITOT 0.4  --   --   --  <0.1*  ALBUMIN 2.7*  --   --   --  2.2*  MG  --  1.8  --   --  1.7  CRP  --   --   --  15.1*  --   LATICACIDVEN 0.7 0.7  --   --   --      ------------------------------------------------------------------------------------------------------------------ No results for input(s): "CHOL", "HDL", "LDLCALC", "TRIG", "CHOLHDL", "LDLDIRECT" in the last 72 hours.  Lab Results  Component Value Date   HGBA1C 6.7 (H) 05/22/2021   ------------------------------------------------------------------------------------------------------------------ No results for input(s): "TSH", "T4TOTAL", "T3FREE", "THYROIDAB" in the last 72 hours.  Invalid input(s): "FREET3"  Cardiac Enzymes No results for input(s): "CKMB", "TROPONINI", "MYOGLOBIN" in the last 168 hours.  Invalid input(s):  "CK" ------------------------------------------------------------------------------------------------------------------ No results found for: "BNP"  CBG: Recent Labs  Lab 02/07/22 1734 02/07/22 1835 02/07/22 1957 02/08/22 0855 02/08/22 1142  GLUCAP 117* 102* 102* 112* 152*     Recent Results (from the past 240 hour(s))  WOUND CULTURE     Status: None (Preliminary result)   Collection Time: 02/06/22 10:14 AM   Specimen: Abscess; Wound  Result Value Ref Range Status   MICRO NUMBER: [id number]  Preliminary   SPECIMEN QUALITY: Adequate  Preliminary   SOURCE: LEFT TOE  Preliminary   STATUS: PRELIMINARY  Preliminary   GRAM STAIN:   Preliminary    No white blood cells seen No epithelial cells seen Many Gram negative bacilli Few Gram positive cocci in pairs  Blood culture (routine x 2)     Status: None (Preliminary result)   Collection Time: 02/06/22 11:25 AM   Specimen: BLOOD  Result Value Ref Range Status   Specimen Description BLOOD BLOOD RIGHT FOREARM  Final   Special Requests   Final    BOTTLES DRAWN AEROBIC AND ANAEROBIC Blood Culture adequate volume   Culture   Final    NO GROWTH 2 DAYS Performed at [facility], [address], [place], [place] [address]    Report Status PENDING  Incomplete  Blood culture (routine x 2)     Status: None (Preliminary result)   Collection Time: 02/06/22 11:27 AM   Specimen: BLOOD  Result Value Ref Range Status   Specimen Description BLOOD BLOOD LEFT FOREARM  Final   Special Requests   Final    BOTTLES DRAWN AEROBIC AND ANAEROBIC Blood Culture adequate volume   Culture   Final    NO GROWTH 2 DAYS Performed at [facility], [place] [address], [place], [place] [address]    Report Status PENDING  Incomplete  Surgical pcr screen     Status: None   Collection Time: 02/07/22  3:07 PM  Result Value Ref Range Status  MRSA, PCR NEGATIVE NEGATIVE Final   Staphylococcus aureus NEGATIVE NEGATIVE Final    Comment: (NOTE) The Xpert SA  Assay (FDA approved for NASAL specimens in patients 65 years of age and older), is one component of a comprehensive surveillance program. It is not intended to diagnose infection nor to guide or monitor treatment. Performed at [facility], [place] [address], [place], [place] [address]   Aerobic/Anaerobic Culture w Gram Stain (surgical/deep wound)     Status: None (Preliminary result)   Collection Time: 02/07/22  6:06 PM   Specimen: Soft Tissue, Other  Result Value Ref Range Status   Specimen Description TISSUE LEFT FOOT  Final   Special Requests SWAB OF DEEP TISSUE PT ON DACTOMYCIN  Final   Gram Stain   Final    RARE WBC PRESENT, PREDOMINANTLY PMN MODERATE GRAM POSITIVE RODS RARE GRAM POSITIVE COCCI IN PAIRS Performed at [facility], [place] [address], [place], [place] [address]    Culture PENDING  Incomplete   Report Status PENDING  Incomplete     Radiology Studies: No results found.   Marzetta Board, MD, PhD [facility]  Between 7 am - 7 pm I am available, please contact me via Amion (for emergencies) or Securechat (non urgent messages)  Between 7 pm - 7 am I am not available, please contact night coverage MD/APP via Amion

## 2022-02-08 NOTE — Progress Notes (Addendum)
CROSS COVER NOTE  NAME: [name] MRN: [id number] DOB : [birth year]    Date of Service   02/08/2022   HPI/Events of Note   RN reports patient with uncontrolled pain, 10/10.  Patient reports no pain relief with current med regimen. Patient had 0R procedure today.  Procedures:             1) amputation of the left fourth toe at metatarsophalangeal joint             2) I&D of abscess tendon sheath with wide debridement in multiple fascial planes    Interventions/ Plan   Additional dose of Dilaudid to be given now,  PRN Dilaudid changed to Q2.       Raenette Rover, DNP, [place]

## 2022-02-09 ENCOUNTER — Encounter (HOSPITAL_COMMUNITY): Payer: Self-pay | Admitting: Anesthesiology

## 2022-02-09 ENCOUNTER — Encounter (HOSPITAL_COMMUNITY)
Admission: EM | Disposition: A | Discharge status: Home Health | Payer: Self-pay | Source: Home / Self Care | Attending: Internal Medicine

## 2022-02-09 DIAGNOSIS — E11628 Type 2 diabetes mellitus with other skin complications: Secondary | ICD-10-CM | POA: Diagnosis not present

## 2022-02-09 DIAGNOSIS — L089 Local infection of the skin and subcutaneous tissue, unspecified: Secondary | ICD-10-CM | POA: Diagnosis not present

## 2022-02-09 DIAGNOSIS — Z794 Long term (current) use of insulin: Secondary | ICD-10-CM | POA: Diagnosis not present

## 2022-02-09 LAB — BASIC METABOLIC PANEL
Anion gap: 10 (ref 5–15)
BUN: 15 mg/dL (ref 6–20)
CO2: 25 mmol/L (ref 22–32)
Calcium: 8.5 mg/dL — ABNORMAL LOW (ref 8.9–10.3)
Chloride: 103 mmol/L (ref 98–111)
Creatinine, Ser: 0.9 mg/dL (ref 0.61–1.24)
GFR, Estimated: >60 mL/min (ref >60)
Glucose, Bld: 209 mg/dL — ABNORMAL HIGH (ref 70–99)
Potassium: 3.5 mmol/L (ref 3.5–5.1)
Sodium: 138 mmol/L (ref 135–145)

## 2022-02-09 LAB — CBC
HCT: 32.2 % — ABNORMAL LOW (ref 39.0–52.0)
Hemoglobin: 10.2 g/dL — ABNORMAL LOW (ref 13.0–17.0)
MCH: 25.4 pg — ABNORMAL LOW (ref 26.0–34.0)
MCHC: 31.7 g/dL (ref 30.0–36.0)
MCV: 80.3 fL (ref 80.0–100.0)
Platelets: 358 10*3/uL (ref 150–400)
RBC: 4.01 MIL/uL — ABNORMAL LOW (ref 4.22–5.81)
RDW: 16.4 % — ABNORMAL HIGH (ref 11.5–15.5)
WBC: 10.4 10*3/uL (ref 4.0–10.5)
nRBC: 0 % (ref 0.0–0.2)

## 2022-02-09 LAB — GLUCOSE, CAPILLARY
Glucose-Capillary: 207 mg/dL — ABNORMAL HIGH (ref 70–99)
Glucose-Capillary: 129 mg/dL — ABNORMAL HIGH (ref 70–99)
Glucose-Capillary: 301 mg/dL — ABNORMAL HIGH (ref 70–99)
Glucose-Capillary: 276 mg/dL — ABNORMAL HIGH (ref 70–99)

## 2022-02-09 LAB — MAGNESIUM: Magnesium: 2 mg/dL (ref 1.7–2.4)

## 2022-02-09 LAB — WOUND CULTURE
MICRO NUMBER:: 14444492
SPECIMEN QUALITY:: ADEQUATE

## 2022-02-09 SURGERY — AMPUTATION, FOOT, RAY
Anesthesia: Monitor Anesthesia Care | Laterality: Left

## 2022-02-09 MED ORDER — SODIUM CHLORIDE 0.9 % IV SOLN
3.0000 g | Freq: Four times a day (QID) | INTRAVENOUS | Status: DC
  Administered 2022-02-09 – 2022-02-14 (×19): 3 g via INTRAVENOUS
  Filled 2022-02-09 (×19): qty 8

## 2022-02-09 MED ORDER — SODIUM CHLORIDE 0.9 % IV SOLN
1.5000 g | Freq: Four times a day (QID) | INTRAVENOUS | Status: DC
  Filled 2022-02-09 (×3): qty 4

## 2022-02-09 MED ORDER — INSULIN ASPART 100 UNIT/ML IJ SOLN
5.0000 [IU] | Freq: Once | INTRAMUSCULAR | Status: AC
  Administered 2022-02-09: 5 [IU] via SUBCUTANEOUS

## 2022-02-09 MED ORDER — ACETAMINOPHEN 500 MG PO TABS
1000.0000 mg | ORAL_TABLET | Freq: Once | ORAL | Status: AC
  Administered 2022-02-09: 1000 mg via ORAL
  Filled 2022-02-09: qty 2

## 2022-02-09 NOTE — Progress Notes (Signed)
PROGRESS NOTE  [name] WFU:[id number] DOB: Jul 12, 1960 DOA: 02/06/2022 PCP: [name], MD   LOS: 3 days   Brief Narrative / Interim history: 62 year old male with DM2, HTN, chronic pain, spinal stenosis, history of recurrent foot infections status post total of 7 different amputations in the right and left feet comes into the hospital with concern for left foot infection.  He had a left partial fourth toe amputation 1 to 2 weeks ago, was placed on oral antibiotics but there was concern about him developing infection at the surgical site with increased redness, pain and he came back to the hospital for IV antibiotics and repeat surgery  Subjective / 24h Interval events: Pain acceptable overall.  No abdominal pain, no nausea or vomiting.  Assesement and Plan: Principal Problem:   Diabetic foot infection ([place]) Active Problems:   Controlled IDDM-2 with hyperglycemia   Chronic pain   HTN (hypertension)   Anxiety   Abnormal surgical wound   Spinal stenosis of lumbar region   Microcytic anemia   Hypokalemia  Principal problem Diabetic foot infection, concern for osteomyelitis/cellulitis -on the left foot.  He has been having several surgeries to the right foot, now on the left.  Continue IV antibiotics.  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  He may require return to operative room for further surgery and possible metatarsal head resection -ID also consulted, currently on daptomycin and meropenem for now continue -Vascular consulted today, patient will undergo angiogram tomorrow  Active problems Type 2 diabetes mellitus -continue glargine, sliding scale.  Monitor CBGs, acceptable overall  CBG (last 3)  Recent Labs    02/08/22 1939 02/08/22 2354 02/09/22 0832  GLUCAP 155* 156* 207*     Essential hypertension-continue amlodipine, diltiazem, losartan.  Blood pressure acceptable  Hypokalemia-replete and continue to monitor.  Potassium normalized today  Anemia of chronic disease-monitor, no bleeding.  Hemoglobin stable at 10.2  History of anxiety-continue home medications  Chronic pain, spinal stenosis-continue home medications  Scheduled Meds:  amLODipine  5 mg Oral Daily   aspirin EC  81 mg Oral Q breakfast   atorvastatin  20 mg Oral Daily   buPROPion  300 mg Oral Daily   diltiazem  420 mg Oral Daily   furosemide  20 mg Oral Daily   gabapentin  800 mg Oral TID   insulin aspart  0-15 Units Subcutaneous TID WC   insulin glargine-yfgn  30 Units Subcutaneous BID   losartan  100 mg Oral Daily   pantoprazole  40 mg Oral Daily   sodium chloride flush  3 mL Intravenous Q12H   Continuous Infusions:  DAPTOmycin (CUBICIN) 700 mg in sodium chloride 0.9 % IVPB 700 mg (02/08/22 2231)   meropenem (MERREM) IV 1 g (02/09/22 0508)   PRN Meds:.acetaminophen **OR** acetaminophen, ALPRAZolam, aspirin-acetaminophen-caffeine, HYDROcodone-acetaminophen, HYDROmorphone (DILAUDID) injection, polyethylene glycol  Current Outpatient Medications  Medication Instructions   ALPRAZolam (XANAX) 0.5 mg, Oral, 3 times daily   amLODipine (NORVASC) 5 mg, Oral, Daily   aspirin EC 81 mg, Oral, Daily with breakfast   aspirin-acetaminophen-caffeine (EXCEDRIN MIGRAINE) 250-250-65 MG tablet 1 tablet, Oral, Every 6 hours PRN   atorvastatin (LIPITOR) 20 mg, Oral, Daily   buPROPion (WELLBUTRIN XL) 300 mg, Oral, Daily   diclofenac Sodium (VOLTAREN) 2 g, Topical, Daily PRN   diltiazem (TIAZAC) 420 mg, Oral,  Daily   DULoxetine (CYMBALTA) 60 mg, Oral, Daily   furosemide (LASIX) 20 mg, Oral, Daily   gabapentin (NEURONTIN) 800 mg, Oral, 3 times daily   insulin glargine (LANTUS) 80 Units, Subcutaneous, Daily   losartan (COZAAR) 100 mg, Oral, Daily   metFORMIN (GLUCOPHAGE) 1,000 mg, Oral, 2 times daily,  In the morning & 1300   morphine (MSIR) 15 mg, Oral, Every 12 hours   Multiple Vitamin (MULTIVITAMIN WITH MINERALS) TABS tablet 1 tablet, Oral, Daily   oxyCODONE-acetaminophen (PERCOCET) 10-325 MG tablet 1 tablet, Oral, 4 times daily PRN   pantoprazole (PROTONIX) 40 MG tablet TAKE 1 TABLET(40 MG) BY MOUTH DAILY   sitaGLIPtin (JANUVIA) 100 mg, Oral, Daily   sulfamethoxazole-trimethoprim (BACTRIM DS) 800-160 MG tablet 1 tablet, Oral, 2 times daily   tiZANidine (ZANAFLEX) 4 mg, Oral, Every 8 hours PRN    Diet Orders (From admission, onward)     Start     Ordered   02/10/22 0001  Diet NPO time specified Except for: Sips with Meds  Diet effective midnight       Comments: Hold hypoglycemics and diuretic medications  Question:  Except for  Answer:  Sips with Meds   02/09/22 0814   02/07/22 1920  Diet Carb Modified Fluid consistency: Thin; Room service appropriate? Yes  Diet effective now       Question Answer Comment  Diet-HS Snack? Nothing   Calorie Level Medium 1600-2000   Fluid consistency: Thin   Room service appropriate? Yes      02/07/22 1919            DVT prophylaxis: SCDs Start: 02/06/22 1745   Lab Results  Component Value Date   PLT 358 02/09/2022      Code Status: Full Code  Family Communication: no family at bedside   Status is: Inpatient  Remains inpatient appropriate because: severity of illness  Level of care: Med-Surg  Consultants:  Podiatry ID  Objective: Vitals:   02/08/22 0600 02/08/22 0800 02/08/22 2103 02/09/22 0845  BP: (!) 155/81 (!) 140/74 113/68 139/71  Pulse: (!) 112 (!) 106 97 99  Resp: '14 15 16 18  '$ Temp:  98.8 F (37.1 C) 98.7 F (37.1 C) 98.8 F (37.1 C)  TempSrc:  Oral  Oral  SpO2: 97% 95% 97% 99%  Weight:      Height:        Intake/Output Summary (Last 24 hours) at 02/09/2022 1154 Last data filed at 02/09/2022 1100 Gross per 24 hour  Intake --  Output 927 ml  Net -927 ml    Wt Readings from Last 3 Encounters:   02/06/22 85.3 kg  05/22/21 90.7 kg  05/09/21 88 kg    Examination:  Constitutional: NAD Eyes: lids and conjunctivae normal, no scleral icterus ENMT: mmm Neck: normal, supple Respiratory: clear to auscultation bilaterally, no wheezing, no crackles. Normal respiratory effort.  Cardiovascular: Regular rate and rhythm, no murmurs / rubs / gallops. Abdomen: soft, no distention, no tenderness. Bowel sounds positive.  Skin: no rashes   Data Reviewed: I have independently reviewed following labs and imaging studies   CBC Recent Labs  Lab 02/06/22 1113 02/07/22 0419 02/08/22 0338 02/09/22 0348  WBC 13.5* 11.6* 11.7* 10.4  HGB 10.4* 10.1* 9.7* 10.2*  HCT 32.6* 31.5* 30.4* 32.2*  PLT 373 357 330 358  MCV 78.6* 78.4* 78.4* 80.3  MCH 25.1* 25.1* 25.0* 25.4*  MCHC 31.9 32.1 31.9 31.7  RDW 15.9* 16.2* 16.3* 16.4*  LYMPHSABS 1.6  --   --   --  MONOABS 1.0  --   --   --   EOSABS 0.1  --   --   --   BASOSABS 0.1  --   --   --      Recent Labs  Lab 02/06/22 1113 02/06/22 1929 02/07/22 0419 02/07/22 0958 02/08/22 0338 02/09/22 0348  NA 137  --  138  --  137 138  K 3.3*  --  3.8  --  3.7 3.5  CL 101  --  102  --  104 103  CO2 25  --  27  --  22 25  GLUCOSE 87  --  127*  --  188* 209*  BUN 12  --  12  --  13 15  CREATININE 0.79  --  0.85  --  0.84 0.90  CALCIUM 9.0  --  8.7*  --  8.4* 8.5*  AST 20  --   --   --  18  --   ALT 27  --   --   --  21  --   ALKPHOS 120  --   --   --  91  --   BILITOT 0.4  --   --   --  <0.1*  --   ALBUMIN 2.7*  --   --   --  2.2*  --   MG  --  1.8  --   --  1.7 2.0  CRP  --   --   --  15.1*  --   --   LATICACIDVEN 0.7 0.7  --   --   --   --      ------------------------------------------------------------------------------------------------------------------ No results for input(s): "CHOL", "HDL", "LDLCALC", "TRIG", "CHOLHDL", "LDLDIRECT" in the last 72 hours.  Lab Results  Component Value Date   HGBA1C 6.7 (H) 05/22/2021    ------------------------------------------------------------------------------------------------------------------ No results for input(s): "TSH", "T4TOTAL", "T3FREE", "THYROIDAB" in the last 72 hours.  Invalid input(s): "FREET3"  Cardiac Enzymes No results for input(s): "CKMB", "TROPONINI", "MYOGLOBIN" in the last 168 hours.  Invalid input(s): "CK" ------------------------------------------------------------------------------------------------------------------ No results found for: "BNP"  CBG: Recent Labs  Lab 02/08/22 1142 02/08/22 1617 02/08/22 1939 02/08/22 2354 02/09/22 0832  GLUCAP 152* 153* 155* 156* 207*     Recent Results (from the past 240 hour(s))  WOUND CULTURE     Status: None (Preliminary result)   Collection Time: 02/06/22 10:14 AM   Specimen: Abscess; Wound  Result Value Ref Range Status   MICRO NUMBER: [id number]  Preliminary   SPECIMEN QUALITY: Adequate  Preliminary   SOURCE: LEFT TOE  Preliminary   STATUS: PRELIMINARY  Preliminary   GRAM STAIN:   Preliminary    No white blood cells seen No epithelial cells seen Many Gram negative bacilli Few Gram positive cocci in pairs  Blood culture (routine x 2)     Status: None (Preliminary result)   Collection Time: 02/06/22 11:25 AM   Specimen: BLOOD  Result Value Ref Range Status   Specimen Description BLOOD BLOOD RIGHT FOREARM  Final   Special Requests   Final    BOTTLES DRAWN AEROBIC AND ANAEROBIC Blood Culture adequate volume   Culture   Final    NO GROWTH 3 DAYS Performed at [facility], [address], [place], [place] [address]    Report Status PENDING  Incomplete  Blood culture (routine x 2)     Status: None (Preliminary result)   Collection Time: 02/06/22 11:27 AM   Specimen: BLOOD  Result Value Ref  Range Status   Specimen Description BLOOD BLOOD LEFT FOREARM  Final   Special Requests   Final    BOTTLES DRAWN AEROBIC AND ANAEROBIC Blood Culture adequate volume   Culture   Final    NO  GROWTH 3 DAYS Performed at [facility], [address], [place], [place] [address]    Report Status PENDING  Incomplete  Surgical pcr screen     Status: None   Collection Time: 02/07/22  3:07 PM  Result Value Ref Range Status   MRSA, PCR NEGATIVE NEGATIVE Final   Staphylococcus aureus NEGATIVE NEGATIVE Final    Comment: (NOTE) The Xpert SA Assay (FDA approved for NASAL specimens in patients 46 years of age and older), is one component of a comprehensive surveillance program. It is not intended to diagnose infection nor to guide or monitor treatment. Performed at [facility], [place] [address] [place], [place] [address]   Aerobic/Anaerobic Culture w Gram Stain (surgical/deep wound)     Status: None (Preliminary result)   Collection Time: 02/07/22  6:06 PM   Specimen: Soft Tissue, Other  Result Value Ref Range Status   Specimen Description TISSUE LEFT FOOT  Final   Special Requests SWAB OF DEEP TISSUE PT ON DACTOMYCIN  Final   Gram Stain   Final    RARE WBC PRESENT, PREDOMINANTLY PMN MODERATE GRAM POSITIVE RODS RARE GRAM POSITIVE COCCI IN PAIRS Performed at [facility], [place] [address] [place], [place] [address]    Culture FEW ESCHERICHIA COLI  Final   Report Status PENDING  Incomplete   Organism ID, Bacteria ESCHERICHIA COLI  Final      Susceptibility   Escherichia coli - MIC*    AMPICILLIN 4 SENSITIVE Sensitive     CEFAZOLIN <=4 SENSITIVE Sensitive     CEFEPIME <=0.12 SENSITIVE Sensitive     CEFTAZIDIME <=1 SENSITIVE Sensitive     CEFTRIAXONE <=0.25 SENSITIVE Sensitive     CIPROFLOXACIN <=0.25 SENSITIVE Sensitive     GENTAMICIN <=1 SENSITIVE Sensitive     IMIPENEM <=0.25 SENSITIVE Sensitive     TRIMETH/SULFA <=20 SENSITIVE Sensitive     AMPICILLIN/SULBACTAM <=2 SENSITIVE Sensitive     PIP/TAZO <=4 SENSITIVE Sensitive     * FEW ESCHERICHIA COLI     Radiology Studies: No results found.   Marzetta Board, MD, PhD [facility]  Between 7 am -  7 pm I am available, please contact me via Amion (for emergencies) or Securechat (non urgent messages)  Between 7 pm - 7 am I am not available, please contact night coverage MD/APP via Amion

## 2022-02-09 NOTE — Progress Notes (Signed)
Subjective:  Patient ID: [name], male    DOB: 1960-11-21,  MRN: [id number]  Patient seen at [facility] POD# 2 left fourth toe amputation and I&D  Pain is improving  Negative for chest pain and shortness of breath Fever: no Night sweats: no Constitutional signs: no Review of all other systems is negative Objective:   Vitals:   02/08/22 2103 02/09/22 0845  BP: 113/68 139/71  Pulse: 97 99  Resp: 16 18  Temp: 98.7 F (37.1 C) 98.8 F (37.1 C)  SpO2: 97% 99%   General AA&O x3. Normal mood and affect.  Vascular Weakly palpable pedal pulses, capillary fill intact, some cool temp to 5th toe  Neurologic Epicritic sensation grossly reduced  Dermatologic Incision clean no purulence or malodor no new necrosis, pallor to 5th toe medially, improvement in cellulitis  Orthopedic: MMT 5/5 in dorsiflexion, plantarflexion, inversion, and eversion. Normal joint ROM without pain or crepitus.    Results for orders placed or performed during the hospital encounter of 02/06/22  Blood culture (routine x 2)     Status: None (Preliminary result)   Collection Time: 02/06/22 11:25 AM   Specimen: BLOOD  Result Value Ref Range Status   Specimen Description BLOOD BLOOD RIGHT FOREARM  Final   Special Requests   Final    BOTTLES DRAWN AEROBIC AND ANAEROBIC Blood Culture adequate volume   Culture   Final    NO GROWTH 3 DAYS Performed at [facility], [place] [address] [place], [place] [address]    Report Status PENDING  Incomplete  Blood culture (routine x 2)     Status: None (Preliminary result)   Collection Time: 02/06/22 11:27 AM   Specimen: BLOOD  Result Value Ref Range Status   Specimen Description BLOOD BLOOD LEFT FOREARM  Final   Special Requests   Final    BOTTLES DRAWN AEROBIC AND ANAEROBIC Blood Culture adequate volume   Culture   Final    NO GROWTH 3 DAYS Performed at [facility], [place] [address] [place], [place] [address]    Report Status PENDING  Incomplete   Surgical pcr screen     Status: None   Collection Time: 02/07/22  3:07 PM  Result Value Ref Range Status   MRSA, PCR NEGATIVE NEGATIVE Final   Staphylococcus aureus NEGATIVE NEGATIVE Final    Comment: (NOTE) The Xpert SA Assay (FDA approved for NASAL specimens in patients 2 years of age and older), is one component of a comprehensive surveillance program. It is not intended to diagnose infection nor to guide or monitor treatment. Performed at [facility], [place] [address] [place], [place] [address]   Aerobic/Anaerobic Culture w Gram Stain (surgical/deep wound)     Status: None (Preliminary result)   Collection Time: 02/07/22  6:06 PM   Specimen: Soft Tissue, Other  Result Value Ref Range Status   Specimen Description TISSUE LEFT FOOT  Final   Special Requests SWAB OF DEEP TISSUE PT ON DACTOMYCIN  Final   Gram Stain   Final    RARE WBC PRESENT, PREDOMINANTLY PMN MODERATE GRAM POSITIVE RODS RARE GRAM POSITIVE COCCI IN PAIRS    Culture   Final    FEW GRAM NEGATIVE RODS IDENTIFICATION AND SUSCEPTIBILITIES TO FOLLOW Performed at [facility], [place] [address], [place], [place] [address]    Report Status PENDING  Incomplete   CBC    Component Value Date/Time   WBC 10.4 02/09/2022 0348   RBC 4.01 (  L) 02/09/2022 0348   HGB 10.2 (L) 02/09/2022 0348   HCT 32.2 (L) 02/09/2022 0348   PLT 358 02/09/2022 0348   MCV 80.3 02/09/2022 0348   MCH 25.4 (L) 02/09/2022 0348   MCHC 31.7 02/09/2022 0348   RDW 16.4 (H) 02/09/2022 0348   LYMPHSABS 1.6 02/06/2022 1113   MONOABS 1.0 02/06/2022 1113   EOSABS 0.1 02/06/2022 1113   BASOSABS 0.1 02/06/2022 1113    Assessment & Plan:  Patient was evaluated and treated and all questions answered.   -Going for angiography tomorrow, previous ABIs were essentially nondiagnostic -we will plan to take back after vascular intervention for partial ray resection and closure -Dressing changed, pleased with control of infection currently. We  discussed the 5th toe being cool, may become necrotic but will CTM -Will see and change daily -GPC's and GPR's and cultures, infectious disease following  Criselda Peaches, DPM  Accessible via secure chat for questions or concerns.

## 2022-02-09 NOTE — Consult Note (Signed)
[facility] [place]  Patient name: [name] MRN: [id number] DOB: 10/07/1960 Sex: male   REQUESTING PROVIDER:    Dr. Sherryle Lis   REASON FOR CONSULT:    PAD  HISTORY OF PRESENT ILLNESS:   [name] is a 62 y.o. male, who was admitted on 02/06/2022 with worsening cellulitis of the left fourth toe.  He underwent digital amputation on 02/07/2022.  There is concern of vascular insufficiency.  Previous ABIs showed noncompressible arteries however triphasic waveforms.  The patient has previously undergone right transmetatarsal foot amputation on 05/22/2021.  This was done for ulceration and recurrent osteomyelitis.  This wound has healed.  The patient suffers from diabetes.  This is not well-controlled.  He is a former smoker.  He is medically managed for hypertension.  PAST MEDICAL HISTORY    Past Medical History:  Diagnosis Date   Acute biliary pancreatitis 10/23/2013   Acute osteomyelitis of right foot ([place]) 05/22/2021   AKI (acute kidney injury) ([place]) 05/22/2021   Arthritis    Biliary colic 29/93/7169   Chronic osteomyelitis of toe of right foot ([facility])    Diabetes mellitus    GERD (gastroesophageal reflux disease)    Headache(784.0)    History of kidney stones    Hypertension    Kidney stones    Neuropathy    Osteomyelitis of fourth toe of right foot ([place]) 06/16/2019     FAMILY HISTORY   Family History  Problem Relation Age of Onset   Diabetes Mother    Heart failure Mother    Heart disease Mother    Heart failure Father     SOCIAL HISTORY:   Social History   Socioeconomic History   Marital status: Divorced    Spouse name: Not on file   Number of children: Not on file   Years of education: Not on file   Highest education level: Not on file  Occupational History   Not on file  Tobacco Use   Smoking status: Former    Packs/day: 1.00    Years: 7.00    Total pack years: 7.00    Types:  Cigarettes    Quit date: 01/30/1982    Years since quitting: 40.0   Smokeless tobacco: Never  Vaping Use   Vaping Use: Never used  Substance and Sexual Activity   Alcohol use: No   Drug use: No   Sexual activity: Not on file  Other Topics Concern   Not on file  Social History Narrative   Not on file   Social Determinants of Health   Financial Resource Strain: Not on file  Food Insecurity: Not on file  Transportation Needs: Not on file  Physical Activity: Not on file  Stress: Not on file  Social Connections: Not on file  Intimate Partner Violence: Not on file    ALLERGIES:    Allergies  Allergen Reactions   Latex Rash and Other (See Comments)    Redness     CURRENT MEDICATIONS:    Current Facility-Administered Medications  Medication Dose Route Frequency Provider Last Rate Last Admin   acetaminophen (TYLENOL) tablet 650 mg  650 mg Oral Q6H PRN Criselda Peaches, DPM   650 mg at 02/08/22 2107   Or   acetaminophen (TYLENOL) suppository 650 mg  650 mg Rectal Q6H PRN Criselda Peaches, DPM       ALPRAZolam Duanne Moron) tablet 0.5 mg  0.5 mg Oral BID PRN Criselda Peaches, DPM   0.5 mg  at 02/08/22 2108   amLODipine (NORVASC) tablet 5 mg  5 mg Oral Daily Criselda Peaches, DPM   5 mg at 02/08/22 1941   aspirin EC tablet 81 mg  81 mg Oral Q breakfast Criselda Peaches, DPM   81 mg at 02/08/22 7408   aspirin-acetaminophen-caffeine (EXCEDRIN MIGRAINE) per tablet 1 tablet  1 tablet Oral Q6H PRN Criselda Peaches, DPM   1 tablet at 02/08/22 1808   atorvastatin (LIPITOR) tablet 20 mg  20 mg Oral Daily Criselda Peaches, DPM   20 mg at 02/08/22 1448   buPROPion (WELLBUTRIN XL) 24 hr tablet 300 mg  300 mg Oral Daily Criselda Peaches, DPM   300 mg at 02/08/22 1856   DAPTOmycin (CUBICIN) 700 mg in sodium chloride 0.9 % IVPB  8 mg/kg Intravenous Q2000 Criselda Peaches, DPM 128 mL/hr at 02/08/22 2231 700 mg at 02/08/22 2231   diltiazem (TIAZAC) 24 hr capsule 420 mg  420 mg Oral Daily Criselda Peaches, DPM   420 mg at 02/08/22 0908   furosemide (LASIX) tablet 20 mg  20 mg Oral Daily Criselda Peaches, DPM   20 mg at 02/08/22 3149   gabapentin (NEURONTIN) capsule 800 mg  800 mg Oral TID Criselda Peaches, DPM   800 mg at 02/08/22 2108   HYDROcodone-acetaminophen (NORCO/VICODIN) 5-325 MG per tablet 1 tablet  1 tablet Oral Q6H PRN Criselda Peaches, DPM   1 tablet at 02/08/22 1359   HYDROmorphone (DILAUDID) injection 0.5-1 mg  0.5-1 mg Intravenous Q2H PRN Raenette Rover, NP   1 mg at 02/09/22 0003   insulin aspart (novoLOG) injection 0-15 Units  0-15 Units Subcutaneous TID WC Criselda Peaches, DPM   3 Units at 02/08/22 1808   insulin glargine-yfgn (SEMGLEE) injection 30 Units  30 Units Subcutaneous BID Criselda Peaches, DPM   30 Units at 02/08/22 2116   losartan (COZAAR) tablet 100 mg  100 mg Oral Daily Criselda Peaches, DPM   100 mg at 02/08/22 0909   meropenem (MERREM) 1 g in sodium chloride 0.9 % 100 mL IVPB  1 g Intravenous Q8H Criselda Peaches, DPM 200 mL/hr at 02/09/22 0508 1 g at 02/09/22 0508   pantoprazole (PROTONIX) EC tablet 40 mg  40 mg Oral Daily Criselda Peaches, DPM   40 mg at 02/08/22 7026   polyethylene glycol (MIRALAX / GLYCOLAX) packet 17 g  17 g Oral Daily PRN Criselda Peaches, DPM   17 g at 02/08/22 1808   sodium chloride flush (NS) 0.9 % injection 3 mL  3 mL Intravenous Q12H Criselda Peaches, DPM   3 mL at 02/08/22 0913    REVIEW OF SYSTEMS:   '[X]'$  denotes positive finding, '[ ]'$  denotes negative finding Cardiac  Comments:  Chest pain or chest pressure:    Shortness of breath upon exertion:    Short of breath when lying flat:    Irregular heart rhythm:        Vascular    Pain in calf, thigh, or hip brought on by ambulation:    Pain in feet at night that wakes you up from your sleep:     Blood clot in your veins:    Leg swelling:         Pulmonary    Oxygen at home:    Productive cough:     Wheezing:         Neurologic    Sudden weakness in  arms or legs:      Sudden numbness in arms or legs:     Sudden onset of difficulty speaking or slurred speech:    Temporary loss of vision in one eye:     Problems with dizziness:         Gastrointestinal    Blood in stool:      Vomited blood:         Genitourinary    Burning when urinating:     Blood in urine:        Psychiatric    Major depression:         Hematologic    Bleeding problems:    Problems with blood clotting too easily:        Skin    Rashes or ulcers:        Constitutional    Fever or chills:     PHYSICAL EXAM:   Vitals:   02/08/22 0400 02/08/22 0600 02/08/22 0800 02/08/22 2103  BP: (!) 145/79 (!) 155/81 (!) 140/74 113/68  Pulse: (!) 104 (!) 112 (!) 106 97  Resp: (!) '23 14 15 16  '$ Temp:   98.8 F (37.1 C) 98.7 F (37.1 C)  TempSrc:   Oral   SpO2: 95% 97% 95% 97%  Weight:      Height:        GENERAL: The patient is a well-nourished male, in no acute distress. The vital signs are documented above. CARDIAC: There is a regular rate and rhythm.  VASCULAR: palpable femoral pulses PULMONARY: Nonlabored respirations ABDOMEN: Soft and non-tender  MUSCULOSKELETAL: There are no major deformities or cyanosis. NEUROLOGIC: No focal weakness or paresthesias are detected. SKIN: There are no ulcers or rashes noted. PSYCHIATRIC: The patient has a normal affect.  STUDIES:    I have reviewed the following ABI/TBIToday's ABIToday's TBIPrevious ABIPrevious TBI  +-------+-----------+-----------+------------+------------+  Right 1.52                                            +-------+-----------+-----------+------------+------------+  Left  1.52       0.64                                 +-------+-----------+-----------+------------+------------+    Triphasic waveforms  ASSESSMENT and PLAN   Left fourth toe wound, status post amputation: The patient has noncompressible vessels.  There is concern about blood flow to his foot given that he is a diabetic and  is having recurrent issues.  His ABIs show triphasic waveforms however no toe pressures were recorded.  I think the best option is to proceed with angiography to fully define his arterial anatomy and see if he has any options to improve his blood.  I discussed proceeding with angiogram tomorrow.  This is a right femoral approach with intervention on the left leg if indicated.  He needs to be NPO after Midnight.   Leia Alf, MD, FACS [facility] [phone] Pager [phone]

## 2022-02-09 NOTE — Anesthesia Preprocedure Evaluation (Deleted)
Anesthesia Evaluation    Reviewed: Allergy & Precautions, Patient's Chart, lab work & pertinent test results  History of Anesthesia Complications Negative for: history of anesthetic complications  Airway        Dental   Pulmonary former smoker          Cardiovascular hypertension, Pt. on medications      Neuro/Psych  Headaches PSYCHIATRIC DISORDERS Anxiety        GI/Hepatic Neg liver ROS, PUD,GERD  Medicated,,  Endo/Other  diabetes, Type 2, Insulin Dependent, Oral Hypoglycemic Agents    Renal/GU negative Renal ROS     Musculoskeletal  (+) Arthritis ,    Abdominal   Peds  Hematology  (+) Blood dyscrasia, anemia   Anesthesia Other Findings   Reproductive/Obstetrics                             Anesthesia Physical Anesthesia Plan  ASA: 3  Anesthesia Plan: MAC   Post-op Pain Management: Tylenol PO (pre-op)*   Induction:   PONV Risk Score and Plan: 1 and Propofol infusion and Treatment may vary due to age or medical condition  Airway Management Planned: Natural Airway and Simple Face Mask  Additional Equipment: None  Intra-op Plan:   Post-operative Plan:   Informed Consent:   Plan Discussed with: CRNA and Anesthesiologist  Anesthesia Plan Comments:        Anesthesia Quick Evaluation

## 2022-02-09 NOTE — Progress Notes (Addendum)
[facility] Infectious Diseases Follow Up Note  Patient Identification: Patient Name: [name] MRN: [id number] [place] Date: 02/06/2022 10:56 AM Age: 62 y.o.Today's Date: 02/09/2022  Reason for Visit: DFU   Principal Problem:   Diabetic foot infection ([place]) Active Problems:   Controlled IDDM-2 with hyperglycemia   Chronic pain   HTN (hypertension)   Anxiety   Abnormal surgical wound   Spinal stenosis of lumbar region   Microcytic anemia   Hypokalemia   Antibiotics:  Daptomycin 1/19- Meropoenem 1/19- Ceftriaxone 1/18 Metronidazole 1/18   Lines/Hardware: No known  Interval Events: continues to be afebrile, no leukocytosis, plan for angiogram then further OR by podiatry noted    Assessment 1 y o male with PMH as below including 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 " Unfortunately the abscess continued to extend further proximally with a tension and necrosis of the dorsal and plantar soft tissue surrounding the metatarsal. The metatarsal itself appeared to be healthy" OR cx with Pan S E coli, gram stain with GPR and GP C in pairs.   Vascular surgery has been consulted for angiogram due to concerns for blood flow in the lower extremities. Angiogram planned for 1/22  Recommendations Continue daptomycin until more information from OR cultures DC meropenem and start Unasyn ( E coli is pan S) Fu OR cultures to completion, angiogram as well as further surgical intervention from Podiatry Following peripherally pending  above, call with active questions  Rest of the management as per the primary team. Thank you for the consult. Please page with pertinent questions or concerns.  ______________________________________________________________________ Subjective patient seen and examined at the bedside. He has intermittent shooting pain at the left foot but no complaints otherwise.   Vitals BP 113/68   Pulse 97   Temp 98.7 F (37.1 C)   Resp 16   Ht '5\' 9"'$  (1.753 m)   Wt 85.3 kg   SpO2 97%   BMI 27.76 kg/m     Physical Exam Constitutional:  lying in the bed and appears comfortable     Comments:   Cardiovascular:     Rate and Rhythm: Normal rate and regular rhythm.     Heart sounds:   Pulmonary:     Effort: Pulmonary effort is normal on room air     Comments:   Abdominal:     Palpations: Abdomen is soft.     Tenderness: non distended and non tender  Musculoskeletal:        General: No swelling or tenderness in peripheral joints, left foot is bandaged C/D/I  Skin:    Comments: no rashes   Neurological:     General: No focal deficit present. Awake, alert and oriented   Psychiatric:        Mood and Affect: Mood normal.   Pertinent Microbiology Results for orders placed or performed during the hospital encounter of 02/06/22  Blood culture (routine x 2)     Status: None (Preliminary result)   Collection Time: 02/06/22 11:25 AM   Specimen: BLOOD  Result Value Ref Range  Status   Specimen Description BLOOD BLOOD RIGHT FOREARM  Final   Special Requests   Final    BOTTLES DRAWN AEROBIC AND ANAEROBIC Blood Culture adequate volume   Culture   Final    NO GROWTH 2 DAYS Performed at [facility], [address] [place], [place] [address]    Report Status PENDING  Incomplete  Blood culture (routine x 2)     Status: None (Preliminary result)   Collection Time: 02/06/22 11:27 AM   Specimen: BLOOD  Result Value Ref Range Status   Specimen Description BLOOD BLOOD LEFT FOREARM  Final    Special Requests   Final    BOTTLES DRAWN AEROBIC AND ANAEROBIC Blood Culture adequate volume   Culture   Final    NO GROWTH 2 DAYS Performed at [facility], [place] [address], [place], [place] [address]    Report Status PENDING  Incomplete  Surgical pcr screen     Status: None   Collection Time: 02/07/22  3:07 PM  Result Value Ref Range Status   MRSA, PCR NEGATIVE NEGATIVE Final   Staphylococcus aureus NEGATIVE NEGATIVE Final    Comment: (NOTE) The Xpert SA Assay (FDA approved for NASAL specimens in patients 24 years of age and older), is one component of a comprehensive surveillance program. It is not intended to diagnose infection nor to guide or monitor treatment. Performed at [facility], [place] [address], [place], [place] [address]   Aerobic/Anaerobic Culture w Gram Stain (surgical/deep wound)     Status: None (Preliminary result)   Collection Time: 02/07/22  6:06 PM   Specimen: Soft Tissue, Other  Result Value Ref Range Status   Specimen Description TISSUE LEFT FOOT  Final   Special Requests SWAB OF DEEP TISSUE PT ON DACTOMYCIN  Final   Gram Stain   Final    RARE WBC PRESENT, PREDOMINANTLY PMN MODERATE GRAM POSITIVE RODS RARE GRAM POSITIVE COCCI IN PAIRS    Culture   Final    FEW GRAM NEGATIVE RODS IDENTIFICATION AND SUSCEPTIBILITIES TO FOLLOW Performed at [facility], [place] [address], [place], [place] [address]    Report Status PENDING  Incomplete    Pertinent Lab.    Latest Ref Rng & Units 02/09/2022    3:48 AM 02/08/2022    3:38 AM 02/07/2022    4:19 AM  CBC  WBC 4.0 - 10.5 K/uL 10.4  11.7  11.6   Hemoglobin 13.0 - 17.0 g/dL 10.2  9.7  10.1   Hematocrit 39.0 - 52.0 % 32.2  30.4  31.5   Platelets 150 - 400 K/uL 358  330  357       Latest Ref Rng & Units 02/09/2022    3:48 AM 02/08/2022    3:38 AM 02/07/2022    4:19 AM  CMP  Glucose 70 - 99 mg/dL 209  188  127   BUN 6 - 20 mg/dL '15  13  12   '$ Creatinine 0.61 - 1.24 mg/dL 0.90  0.84  0.85    Sodium 135 - 145 mmol/L 138  137  138   Potassium 3.5 - 5.1 mmol/L 3.5  3.7  3.8   Chloride 98 - 111 mmol/L 103  104  102   CO2 22 - 32 mmol/L '25  22  27   '$ Calcium 8.9 - 10.3 mg/dL 8.5  8.4  8.7   Total Protein 6.5 - 8.1 g/dL  5.4    Total Bilirubin 0.3 -  1.2 mg/dL  <0.1    Alkaline Phos 38 - 126 U/L  91    AST 15 - 41 U/L  18    ALT 0 - 44 U/L  21      Pertinent Imaging today Plain films and CT images have been personally visualized and interpreted; radiology reports have been reviewed. Decision making incorporated into the Impression / Recommendations.  No results found.   I spent 51 minutes for this patient encounter including review of prior medical records, coordination of care with primary/other specialist with greater than 50% of time being face to face/counseling and discussing diagnostics/treatment plan with the patient/family.  Electronically signed by:   Rosiland Oz, MD Infectious Disease Physician [facility] for Infectious Disease Pager: [phone]

## 2022-02-09 NOTE — Progress Notes (Signed)
Mobility Specialist Progress Note   02/09/22 1043  Mobility  Activity Ambulated with assistance to bathroom  Level of Assistance Contact guard assist, steadying assist  Assistive Device Front wheel walker  Distance Ambulated (ft) 14 ft  LLE Weight Bearing WBAT  Activity Response Tolerated well  Mobility Referral Yes  $Mobility charge 1 Mobility   Received pt at EOB setting off bed alarm requesting assistance to BR. CGA throughout transfer w/ min cues d/t proximity to RW and antalgic like gait. Left in BR w/ NT aware of positioning.   Holland Falling Mobility Specialist Please contact via SecureChat or  Rehab office at [phone]

## 2022-02-09 NOTE — Progress Notes (Signed)
Pharmacy Antibiotic Note  [name] a 62 y.o. male admitted with a diabetic foot infection. He has had 7 different procedures (total) of both his R and L foot. He presented with a possible surgical site infection following a L partal fourth toe amputation. Pharmacy has been consulted for daptomycin and meropenem dosing.  S/p left 4th toe amputation on 1/19. Operative culture grew E coli, pansensitive.  Meropenem to change to Unasyn per ID.  Plan: Unasyn 3gm IV q6h. Continue Daptomycin 700 mg (~8 mg/kg) IV q24h Weekly CK on Fridays.  Height: '5\' 9"'$  (175.3 cm) Weight: 85.3 kg (188 lb) IBW/kg (Calculated) : 70.7  Temp (24hrs), Avg:98.5 F (36.9 C), Min:98 F (36.7 C), Max:98.8 F (37.1 C)  Recent Labs  Lab 02/06/22 1113 02/06/22 1929 02/07/22 0419 02/08/22 0338 02/09/22 0348  WBC 13.5*  --  11.6* 11.7* 10.4  CREATININE 0.79  --  0.85 0.84 0.90  LATICACIDVEN 0.7 0.7  --   --   --     Estimated Creatinine Clearance: 94.4 mL/min (by C-G formula based on SCr of 0.9 mg/dL).    Allergies  Allergen Reactions   Latex Rash and Other (See Comments)    Redness     Antimicrobials this admission: Ceftriaxone 1/18>>1/19 flagyl 1/18>>1/19 Daptomycin 1/19>> Meropenem 1/19>>1/21 Unasyn 1/21 >>    Dose adjustments this admission: n/a  Microbiology results: 1/18 Blood: no growth x 3 days to date 1/18 surgcial (tissue L foot): few E coli - pansensitive 1/19 MRSA PCR: negative  -----05/24/21 R foot bone cx: ESBL E coli, E faecalis, Bacteroides caccae (beta-lactamase positive)    Thank you for allowing pharmacy to be a part of this patient's care.  Arty Baumgartner, [place] 02/09/2022 3:29 PM

## 2022-02-10 ENCOUNTER — Encounter (HOSPITAL_COMMUNITY)
Admission: EM | Disposition: A | Discharge status: Home Health | Payer: Self-pay | Source: Home / Self Care | Attending: Internal Medicine

## 2022-02-10 ENCOUNTER — Encounter (HOSPITAL_COMMUNITY): Payer: Self-pay | Admitting: Vascular Surgery

## 2022-02-10 DIAGNOSIS — I70245 Atherosclerosis of native arteries of left leg with ulceration of other part of foot: Secondary | ICD-10-CM

## 2022-02-10 DIAGNOSIS — L089 Local infection of the skin and subcutaneous tissue, unspecified: Secondary | ICD-10-CM | POA: Diagnosis not present

## 2022-02-10 DIAGNOSIS — E11628 Type 2 diabetes mellitus with other skin complications: Secondary | ICD-10-CM | POA: Diagnosis not present

## 2022-02-10 HISTORY — PX: ABDOMINAL AORTOGRAM W/LOWER EXTREMITY: CATH118223

## 2022-02-10 LAB — BASIC METABOLIC PANEL
Anion gap: 10 (ref 5–15)
BUN: 14 mg/dL (ref 6–20)
CO2: 24 mmol/L (ref 22–32)
Calcium: 8.4 mg/dL — ABNORMAL LOW (ref 8.9–10.3)
Chloride: 107 mmol/L (ref 98–111)
Creatinine, Ser: 0.78 mg/dL (ref 0.61–1.24)
GFR, Estimated: >60 mL/min (ref >60)
Glucose, Bld: 90 mg/dL (ref 70–99)
Potassium: 3.3 mmol/L — ABNORMAL LOW (ref 3.5–5.1)
Sodium: 141 mmol/L (ref 135–145)

## 2022-02-10 LAB — GLUCOSE, CAPILLARY
Glucose-Capillary: 110 mg/dL — ABNORMAL HIGH (ref 70–99)
Glucose-Capillary: 105 mg/dL — ABNORMAL HIGH (ref 70–99)
Glucose-Capillary: 176 mg/dL — ABNORMAL HIGH (ref 70–99)

## 2022-02-10 SURGERY — ABDOMINAL AORTOGRAM W/LOWER EXTREMITY
Anesthesia: LOCAL

## 2022-02-10 MED ORDER — LIDOCAINE HCL (PF) 1 % IJ SOLN
INTRAMUSCULAR | Status: DC | PRN
  Administered 2022-02-10: 20 mL

## 2022-02-10 MED ORDER — HYDRALAZINE HCL 20 MG/ML IJ SOLN: 5.0000 mg | INTRAMUSCULAR | Status: DC | PRN

## 2022-02-10 MED ORDER — SODIUM CHLORIDE 0.9 % IV SOLN: 250.0000 mL | INTRAVENOUS | Status: DC | PRN

## 2022-02-10 MED ORDER — HEPARIN (PORCINE) IN NACL 1000-0.9 UT/500ML-% IV SOLN
INTRAVENOUS | Status: AC
  Filled 2022-02-10: qty 1000

## 2022-02-10 MED ORDER — SODIUM CHLORIDE 0.9% FLUSH: 3.0000 mL | INTRAVENOUS | Status: DC | PRN

## 2022-02-10 MED ORDER — HEPARIN SODIUM (PORCINE) 5000 UNIT/ML IJ SOLN
5000.0000 [IU] | Freq: Three times a day (TID) | INTRAMUSCULAR | Status: DC
  Administered 2022-02-10 – 2022-02-14 (×10): 5000 [IU] via SUBCUTANEOUS
  Filled 2022-02-10 (×10): qty 1

## 2022-02-10 MED ORDER — FENTANYL CITRATE (PF) 100 MCG/2ML IJ SOLN
INTRAMUSCULAR | Status: AC
  Filled 2022-02-10: qty 2

## 2022-02-10 MED ORDER — ACETAMINOPHEN 325 MG PO TABS
650.0000 mg | ORAL_TABLET | ORAL | Status: DC | PRN
  Administered 2022-02-12: 650 mg via ORAL

## 2022-02-10 MED ORDER — LIDOCAINE HCL (PF) 1 % IJ SOLN
INTRAMUSCULAR | Status: AC
  Filled 2022-02-10: qty 30

## 2022-02-10 MED ORDER — HYDROMORPHONE HCL 1 MG/ML IJ SOLN
INTRAMUSCULAR | Status: AC
  Filled 2022-02-10: qty 0.5

## 2022-02-10 MED ORDER — LABETALOL HCL 5 MG/ML IV SOLN: 10.0000 mg | INTRAVENOUS | Status: DC | PRN

## 2022-02-10 MED ORDER — SODIUM CHLORIDE 0.9 % WEIGHT BASED INFUSION
1.0000 mL/kg/h | INTRAVENOUS | Status: AC
  Administered 2022-02-10: 1 mL/kg/h via INTRAVENOUS

## 2022-02-10 MED ORDER — IODIXANOL 320 MG/ML IV SOLN
INTRAVENOUS | Status: DC | PRN
  Administered 2022-02-10: 175 mL via INTRA_ARTERIAL

## 2022-02-10 MED ORDER — ONDANSETRON HCL 4 MG/2ML IJ SOLN: 4.0000 mg | Freq: Four times a day (QID) | INTRAMUSCULAR | Status: DC | PRN

## 2022-02-10 MED ORDER — MIDAZOLAM HCL 2 MG/2ML IJ SOLN
INTRAMUSCULAR | Status: AC
  Filled 2022-02-10: qty 2

## 2022-02-10 MED ORDER — POTASSIUM CHLORIDE CRYS ER 20 MEQ PO TBCR
40.0000 meq | EXTENDED_RELEASE_TABLET | Freq: Once | ORAL | Status: AC
  Administered 2022-02-10: 40 meq via ORAL
  Filled 2022-02-10: qty 2

## 2022-02-10 MED ORDER — FENTANYL CITRATE (PF) 100 MCG/2ML IJ SOLN
INTRAMUSCULAR | Status: DC | PRN
  Administered 2022-02-10 (×2): 25 ug via INTRAVENOUS

## 2022-02-10 MED ORDER — SODIUM CHLORIDE 0.9 % IV SOLN: INTRAVENOUS | Status: DC

## 2022-02-10 MED ORDER — HEPARIN (PORCINE) IN NACL 1000-0.9 UT/500ML-% IV SOLN
INTRAVENOUS | Status: DC | PRN
  Administered 2022-02-10 (×3): 500 mL

## 2022-02-10 MED ORDER — MIDAZOLAM HCL 2 MG/2ML IJ SOLN
INTRAMUSCULAR | Status: DC | PRN
  Administered 2022-02-10: 1 mg via INTRAVENOUS

## 2022-02-10 MED ORDER — SODIUM CHLORIDE 0.9% FLUSH
3.0000 mL | Freq: Two times a day (BID) | INTRAVENOUS | Status: DC
  Administered 2022-02-11 – 2022-02-12 (×3): 3 mL via INTRAVENOUS

## 2022-02-10 SURGICAL SUPPLY — 13 items
CATH OMNI FLUSH 5F 65CM (CATHETERS) IMPLANT
DEVICE CLOSURE MYNXGRIP 5F (Vascular Products) IMPLANT
GLIDEWIRE ADV .035X260CM (WIRE) IMPLANT
KIT MICROPUNCTURE NIT STIFF (SHEATH) IMPLANT
KIT PV (KITS) ×1 IMPLANT
SHEATH PINNACLE 5F 10CM (SHEATH) IMPLANT
SHEATH PROBE COVER 6X72 (BAG) IMPLANT
STOPCOCK MORSE 400PSI 3WAY (MISCELLANEOUS) IMPLANT
SYR MEDRAD MARK 7 150ML (SYRINGE) ×1 IMPLANT
TRANSDUCER W/STOPCOCK (MISCELLANEOUS) ×1 IMPLANT
TRAY PV CATH (CUSTOM PROCEDURE TRAY) ×1 IMPLANT
TUBING CIL FLEX 10 FLL-RA (TUBING) IMPLANT
WIRE BENTSON .035X145CM (WIRE) IMPLANT

## 2022-02-10 NOTE — Progress Notes (Signed)
PROGRESS NOTE  [name] IFO:[id number] DOB: Jun 04, 1960 DOA: 02/06/2022 PCP: [name], MD   LOS: 4 days   Brief Narrative / Interim history: 62 year old male with DM2, HTN, chronic pain, spinal stenosis, history of recurrent foot infections status post total of 7 different amputations in the right and left feet comes into the hospital with concern for left foot infection.  He had a left partial fourth toe amputation 1 to 2 weeks ago, was placed on oral antibiotics but there was concern about him developing infection at the surgical site with increased redness, pain and he came back to the hospital for IV antibiotics and repeat surgery  Subjective / 24h Interval events: He woke up in pain this morning as his foot was pressing against the bed.  Denies any chest pain, denies any palpitations.  Awaiting vascular procedure  Assesement and Plan: Principal Problem:   Diabetic foot infection ([place]) Active Problems:   Controlled IDDM-2 with hyperglycemia   Chronic pain   HTN (hypertension)   Anxiety   Abnormal surgical wound   Spinal stenosis of lumbar region   Microcytic anemia   Hypokalemia  Principal problem Diabetic foot infection, concern for osteomyelitis/cellulitis -on the left foot.  He has been having several surgeries to the right foot, now on the left.  Continue IV antibiotics.  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  He may require return to operative room for further surgery and possible metatarsal head resection -ID also consulted, intraoperative microbiology shows pansensitive E. coli, rare GPC in pairs, continue antibiotics per ID -Vascular consulted, he is scheduled for angiogram today.  Active problems Type 2 diabetes mellitus -continue glargine, sliding scale.  CBGs overall  acceptable this morning  CBG (last 3)  Recent Labs    02/09/22 1553 02/09/22 2016 02/10/22 0738  GLUCAP 301* 276* 110*    Essential hypertension-continue amlodipine, diltiazem, losartan.  Blood pressure stable  Hypokalemia-replete again this morning  Anemia of chronic disease-monitor, no bleeding.  Hemoglobin stable at 10.2  History of anxiety-continue home medications  Chronic pain, spinal stenosis-continue home medications  Scheduled Meds:  [MAR Hold] amLODipine  5 mg Oral Daily   [MAR Hold] aspirin EC  81 mg Oral Q breakfast   [MAR Hold] atorvastatin  20 mg Oral Daily   [MAR Hold] buPROPion  300 mg Oral Daily   [MAR Hold] diltiazem  420 mg Oral Daily   [MAR Hold] furosemide  20 mg Oral Daily   [MAR Hold] gabapentin  800 mg Oral TID   [MAR Hold] insulin aspart  0-15 Units Subcutaneous TID WC   [MAR Hold] insulin glargine-yfgn  30 Units Subcutaneous BID   [MAR Hold] losartan  100 mg Oral Daily   [MAR Hold] pantoprazole  40 mg Oral Daily   [MAR Hold] sodium chloride flush  3 mL Intravenous Q12H   Continuous Infusions:  sodium chloride 100 mL/hr at 02/10/22 0755   [MAR Hold] ampicillin-sulbactam (UNASYN) IV Stopped (02/10/22 0542)   [MAR Hold] DAPTOmycin (CUBICIN) 700 mg in sodium chloride 0.9 % IVPB Stopped (02/09/22 2106)   PRN Meds:.[MAR Hold] acetaminophen **OR** [MAR Hold] acetaminophen, [MAR Hold] ALPRAZolam, [MAR Hold] aspirin-acetaminophen-caffeine, fentaNYL, Heparin (Porcine) in NaCl, [MAR Hold] HYDROcodone-acetaminophen, [MAR Hold]  HYDROmorphone (DILAUDID) injection, iodixanol, lidocaine (PF), midazolam, [MAR Hold] polyethylene glycol  Current Outpatient Medications  Medication Instructions  ALPRAZolam (XANAX) 0.5 mg, Oral, 3 times daily   amLODipine (NORVASC) 5 mg, Oral, Daily   aspirin EC 81 mg, Oral, Daily with breakfast   aspirin-acetaminophen-caffeine (EXCEDRIN MIGRAINE) 250-250-65 MG tablet 1 tablet, Oral, Every 6 hours PRN   atorvastatin (LIPITOR) 20  mg, Oral, Daily   buPROPion (WELLBUTRIN XL) 300 mg, Oral, Daily   diclofenac Sodium (VOLTAREN) 2 g, Topical, Daily PRN   diltiazem (TIAZAC) 420 mg, Oral, Daily   DULoxetine (CYMBALTA) 60 mg, Oral, Daily   furosemide (LASIX) 20 mg, Oral, Daily   gabapentin (NEURONTIN) 800 mg, Oral, 3 times daily   insulin glargine (LANTUS) 80 Units, Subcutaneous, Daily   losartan (COZAAR) 100 mg, Oral, Daily   metFORMIN (GLUCOPHAGE) 1,000 mg, Oral, 2 times daily, In the morning & 1300   morphine (MSIR) 15 mg, Oral, Every 12 hours   Multiple Vitamin (MULTIVITAMIN WITH MINERALS) TABS tablet 1 tablet, Oral, Daily   oxyCODONE-acetaminophen (PERCOCET) 10-325 MG tablet 1 tablet, Oral, 4 times daily PRN   pantoprazole (PROTONIX) 40 MG tablet TAKE 1 TABLET(40 MG) BY MOUTH DAILY   sitaGLIPtin (JANUVIA) 100 mg, Oral, Daily   sulfamethoxazole-trimethoprim (BACTRIM DS) 800-160 MG tablet 1 tablet, Oral, 2 times daily   tiZANidine (ZANAFLEX) 4 mg, Oral, Every 8 hours PRN    Diet Orders (From admission, onward)     Start     Ordered   02/10/22 0001  Diet NPO time specified Except for: Sips with Meds  Diet effective midnight       Comments: Hold hypoglycemics and diuretic medications  Question:  Except for  Answer:  Sips with Meds   02/09/22 0814            DVT prophylaxis: SCDs Start: 02/06/22 1745   Lab Results  Component Value Date   PLT 358 02/09/2022      Code Status: Full Code  Family Communication: no family at bedside   Status is: Inpatient  Remains inpatient appropriate because: severity of illness  Level of care: Med-Surg  Consultants:  Podiatry ID  Objective: Vitals:   02/09/22 1500 02/09/22 2053 02/10/22 0734 02/10/22 0935  BP: 114/65 (!) 144/77 138/81   Pulse: 94 100 84   Resp: 18 18    Temp: 98 F (36.7 C) 99.1 F (37.3 C) 97.6 F (36.4 C)   TempSrc: Oral Oral Oral   SpO2: 97% 100% 99% (!) 87%  Weight:      Height:        Intake/Output Summary (Last 24 hours) at  02/10/2022 1033 Last data filed at 02/10/2022 0755 Gross per 24 hour  Intake 1114.81 ml  Output 2 ml  Net 1112.81 ml    Wt Readings from Last 3 Encounters:  02/06/22 85.3 kg  05/22/21 90.7 kg  05/09/21 88 kg    Examination:  Constitutional: NAD Eyes: lids and conjunctivae normal, no scleral icterus ENMT: mmm Neck: normal, supple Respiratory: clear to auscultation bilaterally, no wheezing, no crackles. Normal respiratory effort.  Cardiovascular: Regular rate and rhythm, no murmurs / rubs / gallops. No LE edema. Abdomen: soft, no distention, no tenderness. Bowel sounds positive.  Skin: no rashes Neurologic: no focal deficits, equal strength    Data Reviewed: I have independently reviewed following labs and imaging studies   CBC Recent Labs  Lab 02/06/22 1113 02/07/22 0419 02/08/22 0338 02/09/22 0348  WBC 13.5* 11.6* 11.7* 10.4  HGB 10.4* 10.1* 9.7* 10.2*  HCT 32.6* 31.5* 30.4* 32.2*  PLT 373 357 330 358  MCV 78.6*  78.4* 78.4* 80.3  MCH 25.1* 25.1* 25.0* 25.4*  MCHC 31.9 32.1 31.9 31.7  RDW 15.9* 16.2* 16.3* 16.4*  LYMPHSABS 1.6  --   --   --   MONOABS 1.0  --   --   --   EOSABS 0.1  --   --   --   BASOSABS 0.1  --   --   --      Recent Labs  Lab 02/06/22 1113 02/06/22 1929 02/07/22 0419 02/07/22 0958 02/08/22 0338 02/09/22 0348 02/10/22 0315  NA 137  --  138  --  137 138 141  K 3.3*  --  3.8  --  3.7 3.5 3.3*  CL 101  --  102  --  104 103 107  CO2 25  --  27  --  '22 25 24  '$ GLUCOSE 87  --  127*  --  188* 209* 90  BUN 12  --  12  --  '13 15 14  '$ CREATININE 0.79  --  0.85  --  0.84 0.90 0.78  CALCIUM 9.0  --  8.7*  --  8.4* 8.5* 8.4*  AST 20  --   --   --  18  --   --   ALT 27  --   --   --  21  --   --   ALKPHOS 120  --   --   --  91  --   --   BILITOT 0.4  --   --   --  <0.1*  --   --   ALBUMIN 2.7*  --   --   --  2.2*  --   --   MG  --  1.8  --   --  1.7 2.0  --   CRP  --   --   --  15.1*  --   --   --   LATICACIDVEN 0.7 0.7  --   --   --   --    --      ------------------------------------------------------------------------------------------------------------------ No results for input(s): "CHOL", "HDL", "LDLCALC", "TRIG", "CHOLHDL", "LDLDIRECT" in the last 72 hours.  Lab Results  Component Value Date   HGBA1C 6.7 (H) 05/22/2021   ------------------------------------------------------------------------------------------------------------------ No results for input(s): "TSH", "T4TOTAL", "T3FREE", "THYROIDAB" in the last 72 hours.  Invalid input(s): "FREET3"  Cardiac Enzymes No results for input(s): "CKMB", "TROPONINI", "MYOGLOBIN" in the last 168 hours.  Invalid input(s): "CK" ------------------------------------------------------------------------------------------------------------------ No results found for: "BNP"  CBG: Recent Labs  Lab 02/09/22 0832 02/09/22 1203 02/09/22 1553 02/09/22 2016 02/10/22 0738  GLUCAP 207* 129* 301* 276* 110*     Recent Results (from the past 240 hour(s))  WOUND CULTURE     Status: Abnormal   Collection Time: 02/06/22 10:14 AM   Specimen: Abscess; Wound  Result Value Ref Range Status   MICRO NUMBER: [id number]  Final   SPECIMEN QUALITY: Adequate  Final   SOURCE: LEFT TOE  Final   STATUS: FINAL  Final   GRAM STAIN:   Final    No white blood cells seen No epithelial cells seen Many Gram negative bacilli Few Gram positive cocci in pairs   ISOLATE 1: Escherichia coli (A)  Final    Comment: Heavy growth of Escherichia coli      Susceptibility   Escherichia coli - AEROBIC CULT, GRAM STAIN NEGATIVE 1    AMOX/CLAVULANIC <=2 Sensitive     AMPICILLIN 8 Sensitive     AMPICILLIN/SULBACTAM <=2 Sensitive  CEFAZOLIN* <=4 Not Reportable      * For infections other than uncomplicated UTI caused by E. coli, K. pneumoniae or P. mirabilis: Cefazolin is resistant if MIC > or = 8 mcg/mL. (Distinguishing susceptible versus intermediate for isolates with MIC < or = 4 mcg/mL  requires additional testing.)     CEFTAZIDIME <=1 Sensitive     CEFEPIME <=1 Sensitive     CEFTRIAXONE <=1 Sensitive     CIPROFLOXACIN <=0.25 Sensitive     LEVOFLOXACIN <=0.12 Sensitive     GENTAMICIN <=1 Sensitive     IMIPENEM <=0.25 Sensitive     PIP/TAZO <=4 Sensitive     TOBRAMYCIN <=1 Sensitive     TRIMETH/SULFA* <=20 Sensitive      * For infections other than uncomplicated UTI caused by E. coli, K. pneumoniae or P. mirabilis: Cefazolin is resistant if MIC > or = 8 mcg/mL. (Distinguishing susceptible versus intermediate for isolates with MIC < or = 4 mcg/mL requires additional testing.) Legend: S = Susceptible  I = Intermediate R = Resistant  NS = Not susceptible * = Not tested  NR = Not reported **NN = See antimicrobic comments   Blood culture (routine x 2)     Status: None (Preliminary result)   Collection Time: 02/06/22 11:25 AM   Specimen: BLOOD  Result Value Ref Range Status   Specimen Description BLOOD BLOOD RIGHT FOREARM  Final   Special Requests   Final    BOTTLES DRAWN AEROBIC AND ANAEROBIC Blood Culture adequate volume   Culture   Final    NO GROWTH 4 DAYS Performed at [facility], [place] [address] [place], [place] [address]    Report Status PENDING  Incomplete  Blood culture (routine x 2)     Status: None (Preliminary result)   Collection Time: 02/06/22 11:27 AM   Specimen: BLOOD  Result Value Ref Range Status   Specimen Description BLOOD BLOOD LEFT FOREARM  Final   Special Requests   Final    BOTTLES DRAWN AEROBIC AND ANAEROBIC Blood Culture adequate volume   Culture   Final    NO GROWTH 4 DAYS Performed at [facility], [place] [address] [place], [place] [address]    Report Status PENDING  Incomplete  Surgical pcr screen     Status: None   Collection Time: 02/07/22  3:07 PM  Result Value Ref Range Status   MRSA, PCR NEGATIVE NEGATIVE Final   Staphylococcus aureus NEGATIVE NEGATIVE Final    Comment: (NOTE) The Xpert SA Assay (FDA  approved for NASAL specimens in patients 57 years of age and older), is one component of a comprehensive surveillance program. It is not intended to diagnose infection nor to guide or monitor treatment. Performed at [facility], [place] [address] [place], [place] [address]   Aerobic/Anaerobic Culture w Gram Stain (surgical/deep wound)     Status: None (Preliminary result)   Collection Time: 02/07/22  6:06 PM   Specimen: Soft Tissue, Other  Result Value Ref Range Status   Specimen Description TISSUE LEFT FOOT  Final   Special Requests SWAB OF DEEP TISSUE PT ON DACTOMYCIN  Final   Gram Stain   Final    RARE WBC PRESENT, PREDOMINANTLY PMN MODERATE GRAM POSITIVE RODS RARE GRAM POSITIVE COCCI IN PAIRS Performed at [facility], [place] [address], [place], [place] [address]    Culture   Final    FEW ESCHERICHIA COLI NO ANAEROBES ISOLATED; CULTURE IN PROGRESS FOR  5 DAYS    Report Status PENDING  Incomplete   Organism ID, Bacteria ESCHERICHIA COLI  Final      Susceptibility   Escherichia coli - MIC*    AMPICILLIN 4 SENSITIVE Sensitive     CEFAZOLIN <=4 SENSITIVE Sensitive     CEFEPIME <=0.12 SENSITIVE Sensitive     CEFTAZIDIME <=1 SENSITIVE Sensitive     CEFTRIAXONE <=0.25 SENSITIVE Sensitive     CIPROFLOXACIN <=0.25 SENSITIVE Sensitive     GENTAMICIN <=1 SENSITIVE Sensitive     IMIPENEM <=0.25 SENSITIVE Sensitive     TRIMETH/SULFA <=20 SENSITIVE Sensitive     AMPICILLIN/SULBACTAM <=2 SENSITIVE Sensitive     PIP/TAZO <=4 SENSITIVE Sensitive     * FEW ESCHERICHIA COLI     Radiology Studies: No results found.   Marzetta Board, MD, PhD [facility]  Between 7 am - 7 pm I am available, please contact me via Amion (for emergencies) or Securechat (non urgent messages)  Between 7 pm - 7 am I am not available, please contact night coverage MD/APP via Amion

## 2022-02-10 NOTE — Progress Notes (Signed)
Mobility Specialist Progress Note:   02/10/22 1523  Mobility  Activity Refused mobility   Pt in bed stating his foot is throbbing and would like to hold ambulation right now. Will follow-up as time allows.   Gareth Eagle Layton Tappan Mobility Specialist Please contact via [employer] or  Rehab Office at [phone]

## 2022-02-10 NOTE — Plan of Care (Signed)
  Problem: Metabolic: Goal: Ability to maintain appropriate glucose levels will improve Outcome: Progressing   Problem: Education: Goal: Knowledge of General Education information will improve Description: Including pain rating scale, medication(s)/side effects and non-pharmacologic comfort measures Outcome: Progressing   Problem: Activity: Goal: Risk for activity intolerance will decrease Outcome: Progressing

## 2022-02-10 NOTE — Progress Notes (Signed)
  Transition of Care [facility]) Screening Note   Patient Details  Name: [name] Date of Birth: Dec 02, 1960   Transition of Care [facility]) CM/SW Contact:    Bartholomew Crews, RN Phone Number: [phone] 02/10/2022, 10:17 AM  Angiogram today - IV antibiotics  Transition of Care Department [facility]) has reviewed patient and no TOC needs have been identified at this time. We will continue to monitor patient advancement through interdisciplinary progression rounds. If new patient transition needs arise, please place a TOC consult.

## 2022-02-10 NOTE — Op Note (Signed)
    Patient name: [name] MRN: [id number] DOB: 03-27-1960 Sex: male  02/10/2022 Pre-operative Diagnosis: Left lower extremity critical ischemia with tissue loss in the foot Post-operative diagnosis:  Same Surgeon:  Broadus John, MD Procedure Performed: 1.  Ultrasound-guided micropuncture access of the right common femoral artery 2.  Aortogram 3.  Bilateral lower extremity angiogram 4.  Third cannulation, left lower extremity angiogram 5.  Moderate sedation time 50 minutes 6.  Contrast volume 175 mL 7.  Device assisted closure-Mynx    Indications: Patient is a 62 year old male with longstanding diabetes who presented to the hospital with new onset wounds on the left lower extremity.  On exam, he had nonpalpable pulses.  After discussing the risk and benefits of left lower extremity angiography in an effort to define and improve distal perfusion for wound healing, [name] elected to proceed.  Findings:  Aortogram: No flow-limiting stenosis appreciated in the aortoiliac segments bilaterally On the right: Widely patent common femoral artery, profunda, superficial femoral artery, popliteal artery.  Single-vessel, peroneal artery outflow with occlusion of the anterior tibial and posterior tibial arteries.  The foot fills via collaterals.  On the left: Widely patent common femoral artery, profunda, superficial femoral artery, popliteal artery.  Single-vessel peroneal artery outflow with atretic posterior tibial and anterior tibial arteries.  These occluded at the mid tibia.  The peroneal artery continues into the foot through diffuse collaterals.   Procedure:  The patient was identified in the holding area and taken to room 8.  The patient was then placed supine on the table and prepped and draped in the usual sterile fashion.  A time out was called.  Ultrasound was used to evaluate the right common femoral artery.  It was patent .  A digital ultrasound image was acquired.  A  micropuncture needle was used to access the right common femoral artery under ultrasound guidance.  An 018 wire was advanced without resistance and a micropuncture sheath was placed.  The 018 wire was removed and a benson wire was placed.  The micropuncture sheath was exchanged for a 5 french sheath.  An omniflush catheter was advanced over the wire to the level of L-1.  An abdominal angiogram was obtained.  The Omni Flush catheter was pulled down to the terminal aorta for bilateral lower extremity runoff.  See results above.  In an effort to improve imaging quality, a wire was used to bring the Omni Flush up and over into the left superficial femoral artery to assess flow at the level of the ankle and foot.  See results above.     Impression: Diagnostic angiogram with brisk, single-vessel  peroneal outflow to the feet bilaterally. There is diffuse collateralization in the left foot. No names vessels.     Cassandria Santee, MD [facility] Office: [phone]

## 2022-02-10 NOTE — Progress Notes (Signed)
Attempted to see patient today. Patient is in Cath Lab.  Nursing to change the dressing today.  Order is placed.  Will see him again tomorrow

## 2022-02-10 NOTE — Progress Notes (Signed)
  Daily Progress Note   Subjective: No complaints  Objective: Vitals:   02/10/22 0734 02/10/22 0935  BP: 138/81   Pulse: 84   Resp:    Temp: 97.6 F (36.4 C)   SpO2: 99% (!) 87%    Physical Examination Left foot wounds, nonpalpable pulses Palpable femoral pulses  ASSESSMENT/PLAN:  Patient is a 62 year old male with known peripheral arterial disease, right-sided TMA, who presents with new left-sided wounds.  After discussing the risk and benefits of bilateral lower extremity angiogram with emphasis on the left in an effort to define and improve perfusion for wound healing, [name] elected to proceed.   Cassandria Santee MD MS [facility] [phone] 02/10/2022  10:29 AM

## 2022-02-11 ENCOUNTER — Inpatient Hospital Stay (HOSPITAL_COMMUNITY): Payer: 59 | Admitting: Certified Registered Nurse Anesthetist

## 2022-02-11 ENCOUNTER — Encounter (HOSPITAL_COMMUNITY)
Admission: EM | Disposition: A | Discharge status: Home Health | Payer: Self-pay | Source: Home / Self Care | Attending: Internal Medicine

## 2022-02-11 ENCOUNTER — Encounter (HOSPITAL_COMMUNITY): Payer: Self-pay | Admitting: Internal Medicine

## 2022-02-11 ENCOUNTER — Other Ambulatory Visit: Payer: Self-pay

## 2022-02-11 DIAGNOSIS — D649 Anemia, unspecified: Secondary | ICD-10-CM

## 2022-02-11 DIAGNOSIS — I10 Essential (primary) hypertension: Secondary | ICD-10-CM

## 2022-02-11 DIAGNOSIS — F17210 Nicotine dependence, cigarettes, uncomplicated: Secondary | ICD-10-CM

## 2022-02-11 DIAGNOSIS — E1151 Type 2 diabetes mellitus with diabetic peripheral angiopathy without gangrene: Secondary | ICD-10-CM | POA: Diagnosis not present

## 2022-02-11 DIAGNOSIS — M24572 Contracture, left ankle: Secondary | ICD-10-CM

## 2022-02-11 DIAGNOSIS — E11628 Type 2 diabetes mellitus with other skin complications: Secondary | ICD-10-CM | POA: Diagnosis not present

## 2022-02-11 DIAGNOSIS — L089 Local infection of the skin and subcutaneous tissue, unspecified: Secondary | ICD-10-CM | POA: Diagnosis not present

## 2022-02-11 DIAGNOSIS — Z794 Long term (current) use of insulin: Secondary | ICD-10-CM | POA: Diagnosis not present

## 2022-02-11 DIAGNOSIS — Z7984 Long term (current) use of oral hypoglycemic drugs: Secondary | ICD-10-CM

## 2022-02-11 HISTORY — PX: ACHILLES TENDON SURGERY: SHX542

## 2022-02-11 HISTORY — PX: AMPUTATION: SHX166

## 2022-02-11 LAB — SURGICAL PATHOLOGY

## 2022-02-11 LAB — CBC
HCT: 29.6 % — ABNORMAL LOW (ref 39.0–52.0)
Hemoglobin: 9.4 g/dL — ABNORMAL LOW (ref 13.0–17.0)
MCH: 25.1 pg — ABNORMAL LOW (ref 26.0–34.0)
MCHC: 31.8 g/dL (ref 30.0–36.0)
MCV: 78.9 fL — ABNORMAL LOW (ref 80.0–100.0)
Platelets: 403 10*3/uL — ABNORMAL HIGH (ref 150–400)
RBC: 3.75 MIL/uL — ABNORMAL LOW (ref 4.22–5.81)
RDW: 16.2 % — ABNORMAL HIGH (ref 11.5–15.5)
WBC: 8.8 10*3/uL (ref 4.0–10.5)
nRBC: 0 % (ref 0.0–0.2)

## 2022-02-11 LAB — GLUCOSE, CAPILLARY
Glucose-Capillary: 146 mg/dL — ABNORMAL HIGH (ref 70–99)
Glucose-Capillary: 97 mg/dL (ref 70–99)
Glucose-Capillary: 73 mg/dL (ref 70–99)
Glucose-Capillary: 81 mg/dL (ref 70–99)
Glucose-Capillary: 88 mg/dL (ref 70–99)
Glucose-Capillary: 137 mg/dL — ABNORMAL HIGH (ref 70–99)

## 2022-02-11 LAB — CULTURE, BLOOD (ROUTINE X 2)
Culture: NO GROWTH
Culture: NO GROWTH
Special Requests: ADEQUATE
Special Requests: ADEQUATE

## 2022-02-11 LAB — LIPID PANEL
Cholesterol: 84 mg/dL (ref 0–200)
HDL: 24 mg/dL — ABNORMAL LOW (ref >40)
LDL Cholesterol: 40 mg/dL (ref 0–99)
Total CHOL/HDL Ratio: 3.5 RATIO
Triglycerides: 102 mg/dL (ref <150)
VLDL: 20 mg/dL (ref 0–40)

## 2022-02-11 LAB — COMPREHENSIVE METABOLIC PANEL
ALT: 54 U/L — ABNORMAL HIGH (ref 0–44)
AST: 40 U/L (ref 15–41)
Albumin: 2.2 g/dL — ABNORMAL LOW (ref 3.5–5.0)
Alkaline Phosphatase: 102 U/L (ref 38–126)
Anion gap: 9 (ref 5–15)
BUN: 11 mg/dL (ref 6–20)
CO2: 23 mmol/L (ref 22–32)
Calcium: 8.3 mg/dL — ABNORMAL LOW (ref 8.9–10.3)
Chloride: 107 mmol/L (ref 98–111)
Creatinine, Ser: 0.73 mg/dL (ref 0.61–1.24)
GFR, Estimated: >60 mL/min (ref >60)
Glucose, Bld: 171 mg/dL — ABNORMAL HIGH (ref 70–99)
Potassium: 3.6 mmol/L (ref 3.5–5.1)
Sodium: 139 mmol/L (ref 135–145)
Total Bilirubin: 0.1 mg/dL — ABNORMAL LOW (ref 0.3–1.2)
Total Protein: 5.5 g/dL — ABNORMAL LOW (ref 6.5–8.1)

## 2022-02-11 LAB — MAGNESIUM: Magnesium: 2 mg/dL (ref 1.7–2.4)

## 2022-02-11 SURGERY — AMPUTATION, FOOT, RAY
Anesthesia: General | Laterality: Left

## 2022-02-11 MED ORDER — OXYCODONE HCL 5 MG/5ML PO SOLN: 5.0000 mg | Freq: Once | ORAL | Status: DC | PRN

## 2022-02-11 MED ORDER — ATORVASTATIN CALCIUM 40 MG PO TABS
40.0000 mg | ORAL_TABLET | Freq: Every day | ORAL | Status: DC
  Administered 2022-02-11 – 2022-02-14 (×4): 40 mg via ORAL
  Filled 2022-02-11 (×4): qty 1

## 2022-02-11 MED ORDER — MIDAZOLAM HCL 2 MG/2ML IJ SOLN
INTRAMUSCULAR | Status: AC
  Administered 2022-02-11: 2 mg via INTRAVENOUS
  Filled 2022-02-11: qty 2

## 2022-02-11 MED ORDER — FENTANYL CITRATE PF 50 MCG/ML IJ SOSY: 50.0000 ug | PREFILLED_SYRINGE | Freq: Once | INTRAMUSCULAR | Status: DC

## 2022-02-11 MED ORDER — BUPIVACAINE-EPINEPHRINE (PF) 0.5% -1:200000 IJ SOLN
INTRAMUSCULAR | Status: DC | PRN
  Administered 2022-02-11: 15 mL via PERINEURAL
  Administered 2022-02-11: 25 mL via PERINEURAL

## 2022-02-11 MED ORDER — LACTATED RINGERS IV SOLN: INTRAVENOUS | Status: DC

## 2022-02-11 MED ORDER — OXYCODONE HCL 5 MG PO TABS: 5.0000 mg | ORAL_TABLET | Freq: Once | ORAL | Status: DC | PRN

## 2022-02-11 MED ORDER — CHLORHEXIDINE GLUCONATE 0.12 % MT SOLN: 15.0000 mL | Freq: Once | OROMUCOSAL | Status: AC

## 2022-02-11 MED ORDER — MIDAZOLAM HCL 2 MG/2ML IJ SOLN: 2.0000 mg | Freq: Once | INTRAMUSCULAR | Status: AC

## 2022-02-11 MED ORDER — ORAL CARE MOUTH RINSE: 15.0000 mL | Freq: Once | OROMUCOSAL | Status: AC

## 2022-02-11 MED ORDER — INSULIN ASPART 100 UNIT/ML IJ SOLN: 0.0000 [IU] | INTRAMUSCULAR | Status: DC | PRN

## 2022-02-11 MED ORDER — FENTANYL CITRATE (PF) 100 MCG/2ML IJ SOLN
INTRAMUSCULAR | Status: AC
  Administered 2022-02-11: 50 ug
  Filled 2022-02-11: qty 2

## 2022-02-11 MED ORDER — MIDAZOLAM HCL 2 MG/2ML IJ SOLN
INTRAMUSCULAR | Status: DC | PRN
  Administered 2022-02-11: 2 mg via INTRAVENOUS

## 2022-02-11 MED ORDER — MIDAZOLAM HCL 2 MG/2ML IJ SOLN
INTRAMUSCULAR | Status: AC
  Filled 2022-02-11: qty 2

## 2022-02-11 MED ORDER — PROMETHAZINE HCL 25 MG/ML IJ SOLN: 6.2500 mg | INTRAMUSCULAR | Status: DC | PRN

## 2022-02-11 MED ORDER — 0.9 % SODIUM CHLORIDE (POUR BTL) OPTIME
TOPICAL | Status: DC | PRN
  Administered 2022-02-11: 1000 mL

## 2022-02-11 MED ORDER — PROPOFOL 500 MG/50ML IV EMUL
INTRAVENOUS | Status: DC | PRN
  Administered 2022-02-11: 75 ug/kg/min via INTRAVENOUS

## 2022-02-11 MED ORDER — PHENYLEPHRINE HCL-NACL 20-0.9 MG/250ML-% IV SOLN
INTRAVENOUS | Status: DC | PRN
  Administered 2022-02-11: 10 ug/min via INTRAVENOUS

## 2022-02-11 MED ORDER — CHLORHEXIDINE GLUCONATE 0.12 % MT SOLN
OROMUCOSAL | Status: AC
  Administered 2022-02-11: 15 mL via OROMUCOSAL
  Filled 2022-02-11: qty 15

## 2022-02-11 MED ORDER — FENTANYL CITRATE (PF) 100 MCG/2ML IJ SOLN: 25.0000 ug | INTRAMUSCULAR | Status: DC | PRN

## 2022-02-11 MED ORDER — PROPOFOL 10 MG/ML IV BOLUS
INTRAVENOUS | Status: DC | PRN
  Administered 2022-02-11: 50 mg via INTRAVENOUS

## 2022-02-11 SURGICAL SUPPLY — 59 items
BAG COUNTER SPONGE SURGICOUNT (BAG) ×1 IMPLANT
BLADE LONG MED 31X9 (MISCELLANEOUS) IMPLANT
BLADE SURG 11 STRL SS (BLADE) IMPLANT
BLADE SURG 21 STRL SS (BLADE) IMPLANT
BNDG COHESIVE 6X5 TAN STRL LF (GAUZE/BANDAGES/DRESSINGS) ×1 IMPLANT
BNDG ELASTIC 4X5.8 VLCR STR LF (GAUZE/BANDAGES/DRESSINGS) IMPLANT
BNDG GAUZE DERMACEA FLUFF 4 (GAUZE/BANDAGES/DRESSINGS) IMPLANT
COVER MAYO STAND STRL (DRAPES) ×1 IMPLANT
COVER SURGICAL LIGHT HANDLE (MISCELLANEOUS) ×1 IMPLANT
CUFF TOURN SGL QUICK 24 (TOURNIQUET CUFF)
CUFF TOURN SGL QUICK 34 (TOURNIQUET CUFF)
CUFF TOURN SGL QUICK 42 (TOURNIQUET CUFF) IMPLANT
CUFF TRNQT CYL 24X4X16.5-23 (TOURNIQUET CUFF) IMPLANT
CUFF TRNQT CYL 34X4.125X (TOURNIQUET CUFF) IMPLANT
DRAPE SURG 17X23 STRL (DRAPES) ×1 IMPLANT
DRAPE U-SHAPE 47X51 STRL (DRAPES) ×1 IMPLANT
DRESSING PEEL AND PLC PRVNA 13 (GAUZE/BANDAGES/DRESSINGS) IMPLANT
DRESSING PREVENA PLUS CUSTOM (GAUZE/BANDAGES/DRESSINGS) IMPLANT
DRSG ADAPTIC 3X8 NADH LF (GAUZE/BANDAGES/DRESSINGS) IMPLANT
DRSG EMULSION OIL 3X3 NADH (GAUZE/BANDAGES/DRESSINGS) ×1 IMPLANT
DRSG PEEL AND PLACE PREVENA 13 (GAUZE/BANDAGES/DRESSINGS) ×1
DRSG PREVENA PLUS CUSTOM (GAUZE/BANDAGES/DRESSINGS) ×1
DURAPREP 26ML APPLICATOR (WOUND CARE) ×1 IMPLANT
ELECT REM PT RETURN 9FT ADLT (ELECTROSURGICAL) ×1
ELECTRODE REM PT RTRN 9FT ADLT (ELECTROSURGICAL) ×1 IMPLANT
GAUZE SPONGE 2X2 8PLY STRL LF (GAUZE/BANDAGES/DRESSINGS) IMPLANT
GAUZE SPONGE 4X4 12PLY STRL (GAUZE/BANDAGES/DRESSINGS) ×1 IMPLANT
GAUZE XEROFORM 1X8 LF (GAUZE/BANDAGES/DRESSINGS) IMPLANT
GLOVE BIOGEL M 7.0 STRL (GLOVE) ×1 IMPLANT
GLOVE BIOGEL PI IND STRL 9 (GLOVE) ×1 IMPLANT
GLOVE PI ORTHO PRO STRL 7.5 (GLOVE) ×1 IMPLANT
GLOVE SURG ORTHO 9.0 STRL STRW (GLOVE) ×1 IMPLANT
GOWN STRL REUS W/ TWL LRG LVL3 (GOWN DISPOSABLE) ×2 IMPLANT
GOWN STRL REUS W/ TWL XL LVL3 (GOWN DISPOSABLE) ×2 IMPLANT
GOWN STRL REUS W/TWL LRG LVL3 (GOWN DISPOSABLE) ×2
GOWN STRL REUS W/TWL XL LVL3 (GOWN DISPOSABLE) ×2
KIT BASIN OR (CUSTOM PROCEDURE TRAY) ×1 IMPLANT
KIT DRSG PREVENA PLUS 7DAY 125 (MISCELLANEOUS) IMPLANT
KIT TURNOVER KIT B (KITS) ×1 IMPLANT
NDL HYPO 25GX1X1/2 BEV (NEEDLE) IMPLANT
NEEDLE HYPO 25GX1X1/2 BEV (NEEDLE) IMPLANT
NS IRRIG 1000ML POUR BTL (IV SOLUTION) ×1 IMPLANT
PACK ORTHO EXTREMITY (CUSTOM PROCEDURE TRAY) ×1 IMPLANT
PAD ARMBOARD 7.5X6 YLW CONV (MISCELLANEOUS) ×2 IMPLANT
PADDING CAST ABS COTTON 4X4 ST (CAST SUPPLIES) ×1 IMPLANT
SOL PREP POV-IOD 4OZ 10% (MISCELLANEOUS) ×3 IMPLANT
SPECIMEN JAR SMALL (MISCELLANEOUS) ×1 IMPLANT
STAPLER VISISTAT 35W (STAPLE) IMPLANT
SUT ETHILON 2 0 FS 18 (SUTURE) IMPLANT
SUT ETHILON 3 0 PS 1 (SUTURE) ×1 IMPLANT
SUT MNCRL AB 3-0 PS2 18 (SUTURE) IMPLANT
SUT PDS AB 2-0 CT1 27 (SUTURE) IMPLANT
SYR CONTROL 10ML LL (SYRINGE) IMPLANT
TOWEL GREEN STERILE (TOWEL DISPOSABLE) ×1 IMPLANT
TOWEL GREEN STERILE FF (TOWEL DISPOSABLE) ×1 IMPLANT
TUBE CONNECTING 12X1/4 (SUCTIONS) IMPLANT
UNDERPAD 30X36 HEAVY ABSORB (UNDERPADS AND DIAPERS) ×1 IMPLANT
WATER STERILE IRR 1000ML POUR (IV SOLUTION) ×1 IMPLANT
YANKAUER SUCT BULB TIP NO VENT (SUCTIONS) IMPLANT

## 2022-02-11 NOTE — Transfer of Care (Signed)
Immediate Anesthesia Transfer of Care Note  Patient: HERBERTH DEHARO  Procedure(s) Performed: LEFT TRANSMETATARSAL AMPUTATION (Left) ACHILLES LENGTHENING/KIDNER (Left)  Patient Location: PACU  Anesthesia Type:MAC combined with regional for post-op pain  Level of Consciousness: awake  Airway & Oxygen Therapy: Patient connected to nasal cannula oxygen  Post-op Assessment: Report given to RN and Post -op Vital signs reviewed and stable  Post vital signs: Reviewed and stable  Last Vitals:  Vitals Value Taken Time  BP 144/83 02/11/22 1830  Temp 36.4 C 02/11/22 1811  Pulse 91 02/11/22 1835  Resp 18 02/11/22 1835  SpO2 99 % 02/11/22 1835  Vitals shown include unvalidated device data.  Last Pain:  Vitals:   02/11/22 1811  TempSrc:   PainSc: 0-No pain      Patients Stated Pain Goal: 0 (11/46/43 1427)  Complications: No notable events documented. Transfer of care done with Winfield Rast, CRNA, and PACU RN

## 2022-02-11 NOTE — Anesthesia Procedure Notes (Signed)
Anesthesia Regional Block: Adductor canal block   Pre-Anesthetic Checklist: , timeout performed,  Correct Patient, Correct Site, Correct Laterality,  Correct Procedure, Correct Position, site marked,  Risks and benefits discussed,  Surgical consent,  Pre-op evaluation,  At surgeon's request and post-op pain management  Laterality: Left  Prep: chloraprep       Needles:  Injection technique: Single-shot  Needle Type: Echogenic Needle     Needle Length: 10cm  Needle Gauge: 21     Additional Needles:   Narrative:  Start time: 02/11/2022 4:38 PM End time: 02/11/2022 4:41 PM Injection made incrementally with aspirations every 5 mL.  Performed by: Personally  Anesthesiologist: Audry Pili, MD  Additional Notes: No pain on injection. No increased resistance to injection. Injection made in 5cc increments. Good needle visualization. Patient tolerated the procedure well.

## 2022-02-11 NOTE — Progress Notes (Signed)
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  He had a left partial fourth toe amputation 1 to 2 weeks ago, was placed on oral antibiotics but there was concern about him developing infection at the surgical site with increased redness, pain and he came back to the hospital for IV antibiotics and repeat surgery  Subjective / 24h Interval events: Complains of left foot pain, which is controlled.  Denies any chest pain, denies any shortness of breath, no abdominal pain, nausea or vomiting.  Assesement and Plan: Principal Problem:   Diabetic foot infection ([place]) Active Problems:   Controlled IDDM-2 with hyperglycemia   Chronic pain   HTN (hypertension)   Anxiety   Abnormal surgical wound   Spinal stenosis of lumbar region   Microcytic anemia   Hypokalemia  Principal problem Diabetic foot infection, concern for osteomyelitis/cellulitis -on the left foot.  He has been having several surgeries to the right foot, now on the left.  Continue IV antibiotics.  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  He will have repeat surgery today for transmetatarsal amputation -ID also consulted, intraoperative microbiology shows pansensitive E. coli, rare GPC in pairs, continue antibiotics per ID, currently on Unasyn and daptomycin -Vascular consulted, underwent angiogram 1/22 which showed bilateral single-vessel peroneal outflow with bilateral foot filling through collaterals.  Vascular feels  like he is currently optimized.  Continue aspirin, statin  Active problems Type 2 diabetes mellitus -continue glargine, sliding scale.  CBGs acceptable  CBG (last 3)  Recent Labs    02/10/22 1107 02/10/22 2034 02/11/22 0533  GLUCAP 105* 176* 146*    Essential hypertension-continue amlodipine, diltiazem, losartan.  Blood pressure overall stable today  Hypokalemia-K36 today  Anemia of chronic disease-monitor, no bleeding.  Hemoglobin stable at 10.2  History of anxiety-continue home medications  Chronic pain, spinal stenosis-continue home medications  Scheduled Meds:  amLODipine  5 mg Oral Daily   aspirin EC  81 mg Oral Q breakfast   atorvastatin  40 mg Oral Daily   buPROPion  300 mg Oral Daily   diltiazem  420 mg Oral Daily   furosemide  20 mg Oral Daily   gabapentin  800 mg Oral TID   heparin  5,000 Units Subcutaneous Q8H   insulin aspart  0-15 Units Subcutaneous TID WC   insulin glargine-yfgn  30 Units Subcutaneous BID   losartan  100 mg Oral Daily   pantoprazole  40 mg Oral Daily   sodium chloride flush  3 mL Intravenous Q12H   sodium chloride flush  3 mL Intravenous Q12H   Continuous Infusions:  sodium chloride 100 mL/hr at 02/11/22 0857   sodium chloride     ampicillin-sulbactam (UNASYN) IV 3 g (02/11/22 0553)   DAPTOmycin (CUBICIN) 700 mg in sodium chloride 0.9 % IVPB 700 mg (02/10/22 2235)   PRN Meds:.sodium chloride, acetaminophen **OR** acetaminophen, acetaminophen, ALPRAZolam, aspirin-acetaminophen-caffeine, hydrALAZINE, HYDROcodone-acetaminophen, HYDROmorphone (DILAUDID) injection, labetalol, ondansetron (ZOFRAN) IV, polyethylene glycol, sodium chloride flush  Current Outpatient Medications  Medication Instructions  ALPRAZolam (XANAX) 0.5 mg, Oral, 3 times daily   amLODipine (NORVASC) 5 mg, Oral, Daily   aspirin EC 81 mg, Oral, Daily with breakfast   aspirin-acetaminophen-caffeine (EXCEDRIN MIGRAINE) 250-250-65 MG tablet 1 tablet, Oral, Every 6 hours PRN    atorvastatin (LIPITOR) 20 mg, Oral, Daily   buPROPion (WELLBUTRIN XL) 300 mg, Oral, Daily   diclofenac Sodium (VOLTAREN) 2 g, Topical, Daily PRN   diltiazem (TIAZAC) 420 mg, Oral, Daily   DULoxetine (CYMBALTA) 60 mg, Oral, Daily   furosemide (LASIX) 20 mg, Oral, Daily   gabapentin (NEURONTIN) 800 mg, Oral, 3 times daily   insulin glargine (LANTUS) 80 Units, Subcutaneous, Daily   losartan (COZAAR) 100 mg, Oral, Daily   metFORMIN (GLUCOPHAGE) 1,000 mg, Oral, 2 times daily, In the morning & 1300   morphine (MSIR) 15 mg, Oral, Every 12 hours   Multiple Vitamin (MULTIVITAMIN WITH MINERALS) TABS tablet 1 tablet, Oral, Daily   oxyCODONE-acetaminophen (PERCOCET) 10-325 MG tablet 1 tablet, Oral, 4 times daily PRN   pantoprazole (PROTONIX) 40 MG tablet TAKE 1 TABLET(40 MG) BY MOUTH DAILY   sitaGLIPtin (JANUVIA) 100 mg, Oral, Daily   sulfamethoxazole-trimethoprim (BACTRIM DS) 800-160 MG tablet 1 tablet, Oral, 2 times daily   tiZANidine (ZANAFLEX) 4 mg, Oral, Every 8 hours PRN    Diet Orders (From admission, onward)     Start     Ordered   02/11/22 0751  Diet NPO time specified  Diet effective now        02/11/22 0750            DVT prophylaxis: heparin injection 5,000 Units Start: 02/10/22 2200 SCDs Start: 02/06/22 1745   Lab Results  Component Value Date   PLT 403 (H) 02/11/2022      Code Status: Full Code  Family Communication: no family at bedside   Status is: Inpatient  Remains inpatient appropriate because: severity of illness  Level of care: Progressive Cardiac  Consultants:  Podiatry ID  Objective: Vitals:   02/10/22 1931 02/10/22 2319 02/11/22 0359 02/11/22 0847  BP: (!) 146/80 (!) 141/81 133/69 (!) 159/85  Pulse:      Resp: '20 20 20   '$ Temp: 98.4 F (36.9 C) 98.3 F (36.8 C) 98 F (36.7 C) 98.7 F (37.1 C)  TempSrc: Oral Oral Oral   SpO2: 99% 100% 100% 99%  Weight:      Height:        Intake/Output Summary (Last 24 hours) at 02/11/2022 1135 Last  data filed at 02/11/2022 1032 Gross per 24 hour  Intake 240 ml  Output 2500 ml  Net -2260 ml    Wt Readings from Last 3 Encounters:  02/06/22 85.3 kg  05/22/21 90.7 kg  05/09/21 88 kg    Examination:  Constitutional: NAD Eyes: lids and conjunctivae normal, no scleral icterus ENMT: mmm Neck: normal, supple Respiratory: clear to auscultation bilaterally, no wheezing, no crackles. Normal respiratory effort.  Cardiovascular: Regular rate and rhythm, no murmurs / rubs / gallops. No LE edema. Abdomen: soft, no distention, no tenderness. Bowel sounds positive.  Skin: no rashes Neurologic: no focal deficits, equal strength  Data Reviewed: I have independently reviewed following labs and imaging studies   CBC Recent Labs  Lab 02/06/22 1113 02/07/22 0419 02/08/22 0338 02/09/22 0348 02/11/22 0241  WBC 13.5* 11.6* 11.7* 10.4 8.8  HGB 10.4* 10.1* 9.7* 10.2* 9.4*  HCT 32.6* 31.5* 30.4* 32.2* 29.6*  PLT 373 357 330 358 403*  MCV 78.6* 78.4* 78.4* 80.3 78.9*  MCH  25.1* 25.1* 25.0* 25.4* 25.1*  MCHC 31.9 32.1 31.9 31.7 31.8  RDW 15.9* 16.2* 16.3* 16.4* 16.2*  LYMPHSABS 1.6  --   --   --   --   MONOABS 1.0  --   --   --   --   EOSABS 0.1  --   --   --   --   BASOSABS 0.1  --   --   --   --      Recent Labs  Lab 02/06/22 1113 02/06/22 1929 02/07/22 0419 02/07/22 0958 02/08/22 0338 02/09/22 0348 02/10/22 0315 02/11/22 0241  NA 137  --  138  --  137 138 141 139  K 3.3*  --  3.8  --  3.7 3.5 3.3* 3.6  CL 101  --  102  --  104 103 107 107  CO2 25  --  27  --  '22 25 24 23  '$ GLUCOSE 87  --  127*  --  188* 209* 90 171*  BUN 12  --  12  --  '13 15 14 11  '$ CREATININE 0.79  --  0.85  --  0.84 0.90 0.78 0.73  CALCIUM 9.0  --  8.7*  --  8.4* 8.5* 8.4* 8.3*  AST 20  --   --   --  18  --   --  40  ALT 27  --   --   --  21  --   --  54*  ALKPHOS 120  --   --   --  91  --   --  102  BILITOT 0.4  --   --   --  <0.1*  --   --  0.1*  ALBUMIN 2.7*  --   --   --  2.2*  --   --  2.2*  MG   --  1.8  --   --  1.7 2.0  --  2.0  CRP  --   --   --  15.1*  --   --   --   --   LATICACIDVEN 0.7 0.7  --   --   --   --   --   --      ------------------------------------------------------------------------------------------------------------------ Recent Labs    02/11/22 0241  CHOL 84  HDL 24*  LDLCALC 40  TRIG 102  CHOLHDL 3.5    Lab Results  Component Value Date   HGBA1C 6.7 (H) 05/22/2021   ------------------------------------------------------------------------------------------------------------------ No results for input(s): "TSH", "T4TOTAL", "T3FREE", "THYROIDAB" in the last 72 hours.  Invalid input(s): "FREET3"  Cardiac Enzymes No results for input(s): "CKMB", "TROPONINI", "MYOGLOBIN" in the last 168 hours.  Invalid input(s): "CK" ------------------------------------------------------------------------------------------------------------------ No results found for: "BNP"  CBG: Recent Labs  Lab 02/09/22 2016 02/10/22 0738 02/10/22 1107 02/10/22 2034 02/11/22 0533  GLUCAP 276* 110* 105* 176* 146*     Recent Results (from the past 240 hour(s))  WOUND CULTURE     Status: Abnormal   Collection Time: 02/06/22 10:14 AM   Specimen: Abscess; Wound  Result Value Ref Range Status   MICRO NUMBER: [id number]  Final   SPECIMEN QUALITY: Adequate  Final   SOURCE: LEFT TOE  Final   STATUS: FINAL  Final   GRAM STAIN:   Final    No white blood cells seen No epithelial cells seen Many Gram negative bacilli Few Gram positive cocci in pairs   ISOLATE 1: Escherichia coli (A)  Final    Comment: Heavy growth of  Escherichia coli      Susceptibility   Escherichia coli - AEROBIC CULT, GRAM STAIN NEGATIVE 1    AMOX/CLAVULANIC <=2 Sensitive     AMPICILLIN 8 Sensitive     AMPICILLIN/SULBACTAM <=2 Sensitive     CEFAZOLIN* <=4 Not Reportable      * For infections other than uncomplicated UTI caused by E. coli, K. pneumoniae or P. mirabilis: Cefazolin is resistant if MIC >  or = 8 mcg/mL. (Distinguishing susceptible versus intermediate for isolates with MIC < or = 4 mcg/mL requires additional testing.)     CEFTAZIDIME <=1 Sensitive     CEFEPIME <=1 Sensitive     CEFTRIAXONE <=1 Sensitive     CIPROFLOXACIN <=0.25 Sensitive     LEVOFLOXACIN <=0.12 Sensitive     GENTAMICIN <=1 Sensitive     IMIPENEM <=0.25 Sensitive     PIP/TAZO <=4 Sensitive     TOBRAMYCIN <=1 Sensitive     TRIMETH/SULFA* <=20 Sensitive      * For infections other than uncomplicated UTI caused by E. coli, K. pneumoniae or P. mirabilis: Cefazolin is resistant if MIC > or = 8 mcg/mL. (Distinguishing susceptible versus intermediate for isolates with MIC < or = 4 mcg/mL requires additional testing.) Legend: S = Susceptible  I = Intermediate R = Resistant  NS = Not susceptible * = Not tested  NR = Not reported **NN = See antimicrobic comments   Blood culture (routine x 2)     Status: None   Collection Time: 02/06/22 11:25 AM   Specimen: BLOOD  Result Value Ref Range Status   Specimen Description BLOOD BLOOD RIGHT FOREARM  Final   Special Requests   Final    BOTTLES DRAWN AEROBIC AND ANAEROBIC Blood Culture adequate volume   Culture   Final    NO GROWTH 5 DAYS Performed at [facility], [place] [address], [place], [place] [address]    Report Status 02/11/2022 FINAL  Final  Blood culture (routine x 2)     Status: None   Collection Time: 02/06/22 11:27 AM   Specimen: BLOOD  Result Value Ref Range Status   Specimen Description BLOOD BLOOD LEFT FOREARM  Final   Special Requests   Final    BOTTLES DRAWN AEROBIC AND ANAEROBIC Blood Culture adequate volume   Culture   Final    NO GROWTH 5 DAYS Performed at [facility], [place] [address] [place], [place] [address]    Report Status 02/11/2022 FINAL  Final  Surgical pcr screen     Status: None   Collection Time: 02/07/22  3:07 PM  Result Value Ref Range Status   MRSA, PCR NEGATIVE NEGATIVE Final   Staphylococcus aureus  NEGATIVE NEGATIVE Final    Comment: (NOTE) The Xpert SA Assay (FDA approved for NASAL specimens in patients 54 years of age and older), is one component of a comprehensive surveillance program. It is not intended to diagnose infection nor to guide or monitor treatment. Performed at [facility], [place] [address], [place], [place] [address]   Aerobic/Anaerobic Culture w Gram Stain (surgical/deep wound)     Status: None (Preliminary result)   Collection Time: 02/07/22  6:06 PM   Specimen: Soft Tissue, Other  Result Value Ref Range Status   Specimen Description TISSUE LEFT FOOT  Final   Special Requests SWAB OF DEEP TISSUE PT ON DACTOMYCIN  Final   Gram Stain   Final    RARE WBC PRESENT, PREDOMINANTLY PMN MODERATE GRAM  POSITIVE RODS RARE GRAM POSITIVE COCCI IN PAIRS Performed at [facility], [place] [address], [place], [place] [address]    Culture   Final    FEW ESCHERICHIA COLI NO ANAEROBES ISOLATED; CULTURE IN PROGRESS FOR 5 DAYS    Report Status PENDING  Incomplete   Organism ID, Bacteria ESCHERICHIA COLI  Final      Susceptibility   Escherichia coli - MIC*    AMPICILLIN 4 SENSITIVE Sensitive     CEFAZOLIN <=4 SENSITIVE Sensitive     CEFEPIME <=0.12 SENSITIVE Sensitive     CEFTAZIDIME <=1 SENSITIVE Sensitive     CEFTRIAXONE <=0.25 SENSITIVE Sensitive     CIPROFLOXACIN <=0.25 SENSITIVE Sensitive     GENTAMICIN <=1 SENSITIVE Sensitive     IMIPENEM <=0.25 SENSITIVE Sensitive     TRIMETH/SULFA <=20 SENSITIVE Sensitive     AMPICILLIN/SULBACTAM <=2 SENSITIVE Sensitive     PIP/TAZO <=4 SENSITIVE Sensitive     * FEW ESCHERICHIA COLI     Radiology Studies: No results found.   Marzetta Board, MD, PhD [facility]  Between 7 am - 7 pm I am available, please contact me via Amion (for emergencies) or Securechat (non urgent messages)  Between 7 pm - 7 am I am not available, please contact night coverage MD/APP via Amion

## 2022-02-11 NOTE — Progress Notes (Signed)
Subjective:  Patient ID: [name], male    DOB: Apr 24, 1960,  MRN: [id number]  Patient seen at [facility] POD# 4 left fourth toe amputation and I&D  He went to Cath Lab yesterday with Dr. Virl Cagey  Negative for chest pain and shortness of breath Fever: no Night sweats: no Constitutional signs: no Review of all other systems is negative Objective:   Vitals:   02/10/22 2319 02/11/22 0359  BP: (!) 141/81 133/69  Pulse:    Resp: 20 20  Temp: 98.3 F (36.8 C) 98 F (36.7 C)  SpO2: 100% 100%   General AA&O x3. Normal mood and affect.  Vascular Weakly palpable pedal pulses, capillary fill intact, some cool temp to 5th toe  Neurologic Epicritic sensation grossly reduced  Dermatologic Incision clean no purulence or malodor no new necrosis, pallor to 5th toe globally, improvement in cellulitis  Orthopedic: MMT 5/5 in dorsiflexion, plantarflexion, inversion, and eversion. Normal joint ROM without pain or crepitus.    Results for orders placed or performed during the hospital encounter of 02/06/22  Blood culture (routine x 2)     Status: None   Collection Time: 02/06/22 11:25 AM   Specimen: BLOOD  Result Value Ref Range Status   Specimen Description BLOOD BLOOD RIGHT FOREARM  Final   Special Requests   Final    BOTTLES DRAWN AEROBIC AND ANAEROBIC Blood Culture adequate volume   Culture   Final    NO GROWTH 5 DAYS Performed at [facility], [place] [address] [place], [place] [address]    Report Status 02/11/2022 FINAL  Final  Blood culture (routine x 2)     Status: None   Collection Time: 02/06/22 11:27 AM   Specimen: BLOOD  Result Value Ref Range Status   Specimen Description BLOOD BLOOD LEFT FOREARM  Final   Special Requests   Final    BOTTLES DRAWN AEROBIC AND ANAEROBIC Blood Culture adequate volume   Culture   Final    NO GROWTH 5 DAYS Performed at [facility], [place] [address] [place], [place] [address]    Report Status 02/11/2022 FINAL  Final  Surgical pcr  screen     Status: None   Collection Time: 02/07/22  3:07 PM  Result Value Ref Range Status   MRSA, PCR NEGATIVE NEGATIVE Final   Staphylococcus aureus NEGATIVE NEGATIVE Final    Comment: (NOTE) The Xpert SA Assay (FDA approved for NASAL specimens in patients 69 years of age and older), is one component of a comprehensive surveillance program. It is not intended to diagnose infection nor to guide or monitor treatment. Performed at [facility], [place] [address], [place], [place] [address]   Aerobic/Anaerobic Culture w Gram Stain (surgical/deep wound)     Status: None (Preliminary result)   Collection Time: 02/07/22  6:06 PM   Specimen: Soft Tissue, Other  Result Value Ref Range Status   Specimen Description TISSUE LEFT FOOT  Final   Special Requests SWAB OF DEEP TISSUE PT ON DACTOMYCIN  Final   Gram Stain   Final    RARE WBC PRESENT, PREDOMINANTLY PMN MODERATE GRAM POSITIVE RODS RARE GRAM POSITIVE COCCI IN PAIRS Performed at [facility], [place] [address] [place], [place] [address]    Culture   Final    FEW ESCHERICHIA COLI NO ANAEROBES ISOLATED; CULTURE IN PROGRESS FOR 5 DAYS    Report Status PENDING  Incomplete   Organism ID, Bacteria ESCHERICHIA COLI  Final  Susceptibility   Escherichia coli - MIC*    AMPICILLIN 4 SENSITIVE Sensitive     CEFAZOLIN <=4 SENSITIVE Sensitive     CEFEPIME <=0.12 SENSITIVE Sensitive     CEFTAZIDIME <=1 SENSITIVE Sensitive     CEFTRIAXONE <=0.25 SENSITIVE Sensitive     CIPROFLOXACIN <=0.25 SENSITIVE Sensitive     GENTAMICIN <=1 SENSITIVE Sensitive     IMIPENEM <=0.25 SENSITIVE Sensitive     TRIMETH/SULFA <=20 SENSITIVE Sensitive     AMPICILLIN/SULBACTAM <=2 SENSITIVE Sensitive     PIP/TAZO <=4 SENSITIVE Sensitive     * FEW ESCHERICHIA COLI   CBC    Component Value Date/Time   WBC 8.8 02/11/2022 0241   RBC 3.75 (L) 02/11/2022 0241   HGB 9.4 (L) 02/11/2022 0241   HCT 29.6 (L) 02/11/2022 0241   PLT 403 (H) 02/11/2022 0241    MCV 78.9 (L) 02/11/2022 0241   MCH 25.1 (L) 02/11/2022 0241   MCHC 31.8 02/11/2022 0241   RDW 16.2 (H) 02/11/2022 0241   LYMPHSABS 1.6 02/06/2022 1113   MONOABS 1.0 02/06/2022 1113   EOSABS 0.1 02/06/2022 1113   BASOSABS 0.1 02/06/2022 1113    Assessment & Plan:  Patient was evaluated and treated and all questions answered.   -We reviewed the results of his angiography and I previously have discussed this with Dr. Virl Cagey from vascular surgery as well.  We discussed the single-vessel runoff through the peroneal which is equivalent to what he has on his right side.  We discussed partial ray resection and possible fifth toe amputation but this has a higher chance of nonhealing as well much like his toe amputation.  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  We discussed proceeding with this electively and he would like to proceed.  Discussed risk benefits and potential complications of this.  Will plan for this later today and I spoke with the OR and added to my schedule.  N.p.o. past 830, he just finished breakfast so we can begin surgery at 430 or 5.  We discussed the recovery process including the period of nonweightbearing again which she did well with.  Already has a wheelchair at home.  Should be able to ambulate with bilateral TMA's, toe filler and possible AFOs once he is healed.  All questions addressed.  Criselda Peaches, DPM  Accessible via secure chat for questions or concerns.

## 2022-02-11 NOTE — Brief Op Note (Signed)
02/11/2022  6:08 PM  PATIENT:  [name]  62 y.o. male  PRE-OPERATIVE DIAGNOSIS:  Left Foot Infection, PAD  POST-OPERATIVE DIAGNOSIS:  Left Foot Infection, PAD  PROCEDURE:  Procedure(s): LEFT TRANSMETATARSAL AMPUTATION (Left) ACHILLES LENGTHENING/KIDNER (Left)  SURGEON:  Surgeon(s) and Role:    * Hykeem Ojeda, Stephan Minister, DPM - Primary  PHYSICIAN ASSISTANT:   ASSISTANTS: none   ANESTHESIA:   none  EBL:  10 mL   BLOOD ADMINISTERED:none  DRAINS: Prevena incisional VAC   LOCAL MEDICATIONS USED:  NONE  SPECIMEN:  left forefoot  DISPOSITION OF SPECIMEN:  PATHOLOGY  COUNTS:  YES  TOURNIQUET:  * No tourniquets in log *  DICTATION: .Note written in EPIC  PLAN OF CARE: Admit to inpatient   PATIENT DISPOSITION:  PACU - hemodynamically stable.   Delay start of Pharmacological VTE agent (>24hrs) due to surgical blood loss or risk of bleeding: yes    -Bleeding was adequate. Hopeful should heal -NWB to LLE -AFO ordered -PT ordered -OK to d/c when cleared by PT. He has most needs already from prior RLE surgery including a WC

## 2022-02-11 NOTE — Progress Notes (Addendum)
  Progress Note    02/11/2022 8:18 AM 1 Day Post-Op  Subjective:  says his left foot still hurts    Vitals:   02/10/22 2319 02/11/22 0359  BP: (!) 141/81 133/69  Pulse:    Resp: 20 20  Temp: 98.3 F (36.8 C) 98 F (36.7 C)  SpO2: 100% 100%    Physical Exam: Incisions:  R groin cath site intact and soft Extremities:  L foot bandaged  CBC    Component Value Date/Time   WBC 8.8 02/11/2022 0241   RBC 3.75 (L) 02/11/2022 0241   HGB 9.4 (L) 02/11/2022 0241   HCT 29.6 (L) 02/11/2022 0241   PLT 403 (H) 02/11/2022 0241   MCV 78.9 (L) 02/11/2022 0241   MCH 25.1 (L) 02/11/2022 0241   MCHC 31.8 02/11/2022 0241   RDW 16.2 (H) 02/11/2022 0241   LYMPHSABS 1.6 02/06/2022 1113   MONOABS 1.0 02/06/2022 1113   EOSABS 0.1 02/06/2022 1113   BASOSABS 0.1 02/06/2022 1113    BMET    Component Value Date/Time   NA 139 02/11/2022 0241   K 3.6 02/11/2022 0241   CL 107 02/11/2022 0241   CO2 23 02/11/2022 0241   GLUCOSE 171 (H) 02/11/2022 0241   BUN 11 02/11/2022 0241   CREATININE 0.73 02/11/2022 0241   CALCIUM 8.3 (L) 02/11/2022 0241   GFRNONAA >60 02/11/2022 0241   GFRAA >60 10/13/2019 0414    INR    Component Value Date/Time   INR 1.1 08/15/2008 0145     Intake/Output Summary (Last 24 hours) at 02/11/2022 0818 Last data filed at 02/11/2022 0600 Gross per 24 hour  Intake 240 ml  Output 2050 ml  Net -1810 ml      Assessment/Plan:  62 y.o. male is 1 day post op, s/p: aortogram with bilateral lower extremity angiogram   -Angiogram demonstrated bilateral single vessel peroneal outflow with bilateral foot filling through collaterals -Case has been discussed with podiatry and the plan is for left TMA today  -He is currently vascularly optimized -Continue ASA and statin -We will schedule follow up with our office in 1 year   Vicente Serene, [facility] [phone] 02/11/2022 8:18 AM   Agree with the above.  Vascular will sign off.  Please  call with questions  Annamarie Major

## 2022-02-11 NOTE — Progress Notes (Signed)
[facility] Infectious Diseases Follow Up Note  Patient Identification: Patient Name: [name] MRN: [id number] [place] Date: 02/06/2022 10:56 AM Age: 62 y.o.Today's Date: 02/11/2022  Reason for Visit: DFU/osteomyelitis   Principal Problem:   Diabetic foot infection [facility]) Active Problems:   Controlled IDDM-2 with hyperglycemia   Chronic pain   HTN (hypertension)   Anxiety   Abnormal surgical wound   Spinal stenosis of lumbar region   Microcytic anemia   Hypokalemia   Antibiotics:  Daptomycin 1/19- Meropoenem 1/19-1/20, Unasyn 1/21-c Ceftriaxone 1/18 Metronidazole 1/18   Lines/Hardware: No known  Interval Events: continues to be afebrile, no leukocytosis, s/p angiogram 1/22 and plans for OR with podiatry today  Assessment 1 y o male with PMH as below including RT TMA followed by revision in May 2023  who is  S/p distal left partial fourth digit amputation by Podiatry on 1/4 who was sent to ED from Podiatry for concerns of surgical site infection of left foot, concerns for osteomyelitis   # Surgical site infection of left foot s/p left partial 4th digit amputation 1/4 1/19 s/p amputation of the left 4th toe at the [facility] with I and D of abscess, tendon sheath and wide debridement of multiple fascial planes. " Unfortunately the abscess continued to extend further proximally with a tension and necrosis of the dorsal and plantar soft tissue surrounding the metatarsal. The metatarsal itself appeared to be healthy" OR cx with Pan S E coli, gram stain with GPR and GPC in pairs. 1/18 left toe wound cx with E coli, GPC in pairs and GNR in gram stain   1/22 S/p angiogram : brisk, single-vessel  peroneal outflow to the feet bilaterally. There is diffuse collateralization in the left foot  Plan for left TMA today per podiatry and patient   Recommendations Continue daptomycin until more information about GPR from micro Continue  Unasyn as is  He expressed preference of IV over PO abtx if prolonged tx needed. I explained to him, will make that decision after OR today depending on any concerns for residual bone infection. He may not need PICC line/IV abtx.  Fu OR note from today and podiatry recs  Rest of the management as per the primary team. Thank you for the consult. Please page with pertinent questions or concerns.  ______________________________________________________________________ Subjective patient seen and examined at the bedside. Complains of some pain in the left foot, He desired preference of IV abtx over PO if prolonged abtx is needed. I discussed with him will have to see what is done in OR today regarding any residual infection and decide IV vs PO after that. I also told him he may not need PICC line/IV at all if no concerns for residual bone infection   Vitals BP 133/69 (BP Location: Right Arm)   Pulse 93   Temp 98 F (36.7 C) (Oral)   Resp 20   Ht '5\' 9"'$  (1.753 m)   Wt 85.3 kg   SpO2 100%   BMI 27.76 kg/m     Physical Exam Constitutional:  lying in the bed and appears comfortable     Comments:   Cardiovascular:     Rate and Rhythm: Normal rate and regular rhythm.     Heart sounds:   Pulmonary:     Effort: Pulmonary effort is normal on room air     Comments:   Abdominal:     Palpations: Abdomen is soft.     Tenderness: non distended and non tender  Musculoskeletal:  General: No swelling or tenderness in peripheral joints, left foot is bandaged C/D/I  Skin:    Comments: no rashes   Neurological:     General: No focal deficit present. Awake, alert and oriented   Psychiatric:        Mood and Affect: Mood normal.   Pertinent Microbiology Results for orders placed or performed during the hospital encounter of 02/06/22  Blood culture (routine x 2)     Status: None   Collection Time: 02/06/22 11:25 AM   Specimen: BLOOD  Result Value Ref Range Status   Specimen  Description BLOOD BLOOD RIGHT FOREARM  Final   Special Requests   Final    BOTTLES DRAWN AEROBIC AND ANAEROBIC Blood Culture adequate volume   Culture   Final    NO GROWTH 5 DAYS Performed at [facility], [place] [address] [place], [place] [address]    Report Status 02/11/2022 FINAL  Final  Blood culture (routine x 2)     Status: None   Collection Time: 02/06/22 11:27 AM   Specimen: BLOOD  Result Value Ref Range Status   Specimen Description BLOOD BLOOD LEFT FOREARM  Final   Special Requests   Final    BOTTLES DRAWN AEROBIC AND ANAEROBIC Blood Culture adequate volume   Culture   Final    NO GROWTH 5 DAYS Performed at [facility], [place] [address], [place], [place] [address]    Report Status 02/11/2022 FINAL  Final  Surgical pcr screen     Status: None   Collection Time: 02/07/22  3:07 PM  Result Value Ref Range Status   MRSA, PCR NEGATIVE NEGATIVE Final   Staphylococcus aureus NEGATIVE NEGATIVE Final    Comment: (NOTE) The Xpert SA Assay (FDA approved for NASAL specimens in patients 65 years of age and older), is one component of a comprehensive surveillance program. It is not intended to diagnose infection nor to guide or monitor treatment. Performed at [facility], [place] [address] [place], [place] [address]   Aerobic/Anaerobic Culture w Gram Stain (surgical/deep wound)     Status: None (Preliminary result)   Collection Time: 02/07/22  6:06 PM   Specimen: Soft Tissue, Other  Result Value Ref Range Status   Specimen Description TISSUE LEFT FOOT  Final   Special Requests SWAB OF DEEP TISSUE PT ON DACTOMYCIN  Final   Gram Stain   Final    RARE WBC PRESENT, PREDOMINANTLY PMN MODERATE GRAM POSITIVE RODS RARE GRAM POSITIVE COCCI IN PAIRS Performed at [facility], [place] [address], [place], [place] [address]    Culture   Final    FEW ESCHERICHIA COLI NO ANAEROBES ISOLATED; CULTURE IN PROGRESS FOR 5 DAYS    Report Status PENDING  Incomplete   Organism  ID, Bacteria ESCHERICHIA COLI  Final      Susceptibility   Escherichia coli - MIC*    AMPICILLIN 4 SENSITIVE Sensitive     CEFAZOLIN <=4 SENSITIVE Sensitive     CEFEPIME <=0.12 SENSITIVE Sensitive     CEFTAZIDIME <=1 SENSITIVE Sensitive     CEFTRIAXONE <=0.25 SENSITIVE Sensitive     CIPROFLOXACIN <=0.25 SENSITIVE Sensitive     GENTAMICIN <=1 SENSITIVE Sensitive     IMIPENEM <=0.25 SENSITIVE Sensitive     TRIMETH/SULFA <=20 SENSITIVE Sensitive     AMPICILLIN/SULBACTAM <=2 SENSITIVE Sensitive     PIP/TAZO <=4 SENSITIVE Sensitive     * FEW ESCHERICHIA COLI    Pertinent Lab.  Latest Ref Rng & Units 02/11/2022    2:41 AM 02/09/2022    3:48 AM 02/08/2022    3:38 AM  CBC  WBC 4.0 - 10.5 K/uL 8.8  10.4  11.7   Hemoglobin 13.0 - 17.0 g/dL 9.4  10.2  9.7   Hematocrit 39.0 - 52.0 % 29.6  32.2  30.4   Platelets 150 - 400 K/uL 403  358  330       Latest Ref Rng & Units 02/11/2022    2:41 AM 02/10/2022    3:15 AM 02/09/2022    3:48 AM  CMP  Glucose 70 - 99 mg/dL 171  90  209   BUN 6 - 20 mg/dL '11  14  15   '$ Creatinine 0.61 - 1.24 mg/dL 0.73  0.78  0.90   Sodium 135 - 145 mmol/L 139  141  138   Potassium 3.5 - 5.1 mmol/L 3.6  3.3  3.5   Chloride 98 - 111 mmol/L 107  107  103   CO2 22 - 32 mmol/L '23  24  25   '$ Calcium 8.9 - 10.3 mg/dL 8.3  8.4  8.5   Total Protein 6.5 - 8.1 g/dL 5.5     Total Bilirubin 0.3 - 1.2 mg/dL 0.1     Alkaline Phos 38 - 126 U/L 102     AST 15 - 41 U/L 40     ALT 0 - 44 U/L 54       Pertinent Imaging today Plain films and CT images have been personally visualized and interpreted; radiology reports have been reviewed. Decision making incorporated into the Impression / Recommendations.  PERIPHERAL VASCULAR CATHETERIZATION  Result Date: 02/10/2022 Images from the original result were not included. Patient name: ALWALEED OBESO MRN: [id number] DOB: 10-14-1960 Sex: male 02/10/2022 Pre-operative Diagnosis: Left lower extremity critical ischemia with tissue loss in  the foot Post-operative diagnosis:  Same Surgeon:  Broadus John, MD Procedure Performed: 1.  Ultrasound-guided micropuncture access of the right common femoral artery 2.  Aortogram 3.  Bilateral lower extremity angiogram 4.  Third cannulation, left lower extremity angiogram 5.  Moderate sedation time 50 minutes 6.  Contrast volume 175 mL 7.  Device assisted closure-Mynx Indications: Patient is a 62 year old male with longstanding diabetes who presented to the hospital with new onset wounds on the left lower extremity.  On exam, he had nonpalpable pulses.  After discussing the risk and benefits of left lower extremity angiography in an effort to define and improve distal perfusion for wound healing, [name] elected to proceed. Findings: Aortogram: No flow-limiting stenosis appreciated in the aortoiliac segments bilaterally On the right: Widely patent common femoral artery, profunda, superficial femoral artery, popliteal artery.  Single-vessel, peroneal artery outflow with occlusion of the anterior tibial and posterior tibial arteries.  The foot fills via collaterals. On the left: Widely patent common femoral artery, profunda, superficial femoral artery, popliteal artery.  Single-vessel peroneal artery outflow with atretic posterior tibial and anterior tibial arteries.  These occluded at the mid tibia.  The peroneal artery continues into the foot through diffuse collaterals.  Procedure:  The patient was identified in the holding area and taken to room 8.  The patient was then placed supine on the table and prepped and draped in the usual sterile fashion.  A time out was called.  Ultrasound was used to evaluate the right common femoral artery.  It was patent .  A digital ultrasound image was acquired.  A micropuncture needle was  used to access the right common femoral artery under ultrasound guidance.  An 018 wire was advanced without resistance and a micropuncture sheath was placed.  The 018 wire was removed and a  benson wire was placed.  The micropuncture sheath was exchanged for a 5 french sheath.  An omniflush catheter was advanced over the wire to the level of L-1.  An abdominal angiogram was obtained.  The Omni Flush catheter was pulled down to the terminal aorta for bilateral lower extremity runoff.  See results above.  In an effort to improve imaging quality, a wire was used to bring the Omni Flush up and over into the left superficial femoral artery to assess flow at the level of the ankle and foot.  See results above.  Impression: Diagnostic angiogram with brisk, single-vessel  peroneal outflow to the feet bilaterally. There is diffuse collateralization in the left foot. No names vessels. Cassandria Santee, MD [facility]: [phone]     I spent 51 minutes for this patient encounter including review of prior medical records, coordination of care with primary/other specialist with greater than 50% of time being face to face/counseling and discussing diagnostics/treatment plan with the patient/family.  Electronically signed by:   Rosiland Oz, MD Infectious Disease Physician [facility] Pager: [phone]

## 2022-02-11 NOTE — Anesthesia Procedure Notes (Signed)
Anesthesia Regional Block: Popliteal block   Pre-Anesthetic Checklist: , timeout performed,  Correct Patient, Correct Site, Correct Laterality,  Correct Procedure, Correct Position, site marked,  Risks and benefits discussed,  Surgical consent,  Pre-op evaluation,  At surgeon's request and post-op pain management  Laterality: Left  Prep: chloraprep       Needles:  Injection technique: Single-shot  Needle Type: Echogenic Needle     Needle Length: 10cm  Needle Gauge: 21     Additional Needles:   Narrative:  Start time: 02/11/2022 4:42 PM End time: 02/11/2022 4:45 PM Injection made incrementally with aspirations every 5 mL.  Performed by: Personally  Anesthesiologist: Audry Pili, MD  Additional Notes: No pain on injection. No increased resistance to injection. Injection made in 5cc increments. Good needle visualization. Patient tolerated the procedure well.

## 2022-02-11 NOTE — Anesthesia Postprocedure Evaluation (Signed)
Anesthesia Post Note  Patient: [name]  Procedure(s) Performed: LEFT TRANSMETATARSAL AMPUTATION (Left) ACHILLES LENGTHENING/KIDNER (Left)     Patient location during evaluation: PACU Anesthesia Type: General Level of consciousness: awake and alert, patient cooperative and oriented Pain management: pain level controlled Vital Signs Assessment: post-procedure vital signs reviewed and stable Respiratory status: spontaneous breathing, nonlabored ventilation and respiratory function stable Cardiovascular status: stable and blood pressure returned to baseline Postop Assessment: no apparent nausea or vomiting Anesthetic complications: no   No notable events documented.  Last Vitals:  Vitals:   02/11/22 1841 02/11/22 1845  BP: (!) 144/83 (!) 144/78  Pulse: 86 97  Resp: 15 16  Temp: 36.4 C 36.7 C  SpO2: 98% 100%    Last Pain:  Vitals:   02/11/22 1845  TempSrc: Oral  PainSc:                  Raymond Bhardwaj,E. Chaya Dehaan

## 2022-02-11 NOTE — Anesthesia Preprocedure Evaluation (Addendum)
Anesthesia Evaluation  Patient identified by MRN, date of birth, ID band Patient awake    Reviewed: Allergy & Precautions, NPO status , Patient's Chart, lab work & pertinent test results  History of Anesthesia Complications Negative for: history of anesthetic complications  Airway Mallampati: II  TM Distance: >3 FB Neck ROM: Full    Dental  (+) Dental Advisory Given   Pulmonary former smoker   Pulmonary exam normal        Cardiovascular hypertension, Pt. on medications Normal cardiovascular exam     Neuro/Psych  Headaches PSYCHIATRIC DISORDERS Anxiety        GI/Hepatic PUD,GERD  Medicated and Controlled,,  Endo/Other  diabetes, Type 2, Insulin Dependent, Oral Hypoglycemic Agents    Renal/GU negative Renal ROS     Musculoskeletal  (+) Arthritis ,  narcotic dependent  Abdominal   Peds  Hematology  (+) Blood dyscrasia, anemia   Anesthesia Other Findings   Reproductive/Obstetrics                             Anesthesia Physical Anesthesia Plan  ASA: 3  Anesthesia Plan: Regional   Post-op Pain Management: Regional block* and Tylenol PO (pre-op)*   Induction: Intravenous  PONV Risk Score and Plan: 1 and Treatment may vary due to age or medical condition, Propofol infusion and Midazolam  Airway Management Planned: Natural Airway and Simple Face Mask  Additional Equipment: None  Intra-op Plan:   Post-operative Plan:   Informed Consent: I have reviewed the patients History and Physical, chart, labs and discussed the procedure including the risks, benefits and alternatives for the proposed anesthesia with the patient or authorized representative who has indicated his/her understanding and acceptance.     Dental advisory given  Plan Discussed with: CRNA, Anesthesiologist and Surgeon  Anesthesia Plan Comments:         Anesthesia Quick Evaluation

## 2022-02-11 NOTE — Progress Notes (Signed)
Mobility Specialist Progress Note   02/11/22 1045  Mobility  Activity Moved into chair position in bed;Refused mobility (Bed level exercise)  Level of Assistance Independent  Assistive Device None  Range of Motion/Exercises Active;All extremities  Activity Response Tolerated well   Patient received in supine and reluctant to participate. Declined all OOB activity despite max encouragement and education on risk of debility. Stated he's depressed from having to undergo another surgery to remove half of his other foot and having too much pain. Empathized with patient and expressed my understanding and compassion to the way he felt. He then demonstrated independent AROM and bed level exercises. Tolerated without incident and was thankful for my time spent with him. Was left in supine with all needs met, call bell in reach.   [place] Rether Rison, BS EXP Mobility Specialist Please contact via SecureChat or Rehab office at [phone]

## 2022-02-11 NOTE — Progress Notes (Signed)
Pt ambulated to bathroom with RW without issue. Pt worn post op shoe to left foot. Pt returned to bed without assistance. Bed alarm set. Pt resting with call bell within reach.  Will continue to monitor.

## 2022-02-11 NOTE — Progress Notes (Signed)
PHARMACIST LIPID MONITORING   [name] is a 62 y.o. male admitted on 02/06/2022 with PVD.  Pharmacy has been consulted to optimize lipid-lowering therapy with the indication of secondary prevention for clinical ASCVD.  Recent Labs:  Lipid Panel (last 6 months):   Lab Results  Component Value Date   CHOL 84 02/11/2022   TRIG 102 02/11/2022   HDL 24 (L) 02/11/2022   CHOLHDL 3.5 02/11/2022   VLDL 20 02/11/2022   LDLCALC 40 02/11/2022    Hepatic function panel (last 6 months):   Lab Results  Component Value Date   AST 40 02/11/2022   ALT 54 (H) 02/11/2022   ALKPHOS 102 02/11/2022   BILITOT 0.1 (L) 02/11/2022    SCr (since admission):   Serum creatinine: 0.73 mg/dL 02/11/22 0241 Estimated creatinine clearance: 106.3 mL/min  Current therapy and lipid therapy tolerance Current lipid-lowering therapy: Atorvastatin '20mg'$ /d Documented or reported allergies or intolerances to lipid-lowering therapies (if applicable): none   Plan:    1.Statin intensity (high intensity recommended for all patients regardless of the LDL):  Add or increase statin to high intensity.  2.Add ezetimibe (if any one of the following):   Not indicated at this time.  3.Refer to lipid clinic:   No  4.Follow-up with:  Primary care provider - [name], MD  5.Follow-up labs after discharge:   -LDL at goal, recheck in 1 year  Hildred Laser, PharmD Clinical Pharmacist **Pharmacist phone directory can now be found on [email or web address] ([email or web address]).  Listed under [place].

## 2022-02-12 ENCOUNTER — Encounter (HOSPITAL_COMMUNITY): Payer: Self-pay | Admitting: Podiatry

## 2022-02-12 ENCOUNTER — Telehealth: Payer: Self-pay | Admitting: *Deleted

## 2022-02-12 DIAGNOSIS — M24572 Contracture, left ankle: Secondary | ICD-10-CM

## 2022-02-12 DIAGNOSIS — F419 Anxiety disorder, unspecified: Secondary | ICD-10-CM | POA: Diagnosis not present

## 2022-02-12 DIAGNOSIS — T819XXD Unspecified complication of procedure, subsequent encounter: Secondary | ICD-10-CM | POA: Diagnosis not present

## 2022-02-12 DIAGNOSIS — E1165 Type 2 diabetes mellitus with hyperglycemia: Secondary | ICD-10-CM | POA: Diagnosis not present

## 2022-02-12 DIAGNOSIS — E11628 Type 2 diabetes mellitus with other skin complications: Secondary | ICD-10-CM | POA: Diagnosis not present

## 2022-02-12 LAB — GLUCOSE, CAPILLARY
Glucose-Capillary: 220 mg/dL — ABNORMAL HIGH (ref 70–99)
Glucose-Capillary: 91 mg/dL (ref 70–99)
Glucose-Capillary: 185 mg/dL — ABNORMAL HIGH (ref 70–99)
Glucose-Capillary: 249 mg/dL — ABNORMAL HIGH (ref 70–99)
Glucose-Capillary: 201 mg/dL — ABNORMAL HIGH (ref 70–99)
Glucose-Capillary: 148 mg/dL — ABNORMAL HIGH (ref 70–99)

## 2022-02-12 LAB — BASIC METABOLIC PANEL
Anion gap: 10 (ref 5–15)
BUN: 14 mg/dL (ref 6–20)
CO2: 26 mmol/L (ref 22–32)
Calcium: 8.5 mg/dL — ABNORMAL LOW (ref 8.9–10.3)
Chloride: 104 mmol/L (ref 98–111)
Creatinine, Ser: 0.89 mg/dL (ref 0.61–1.24)
GFR, Estimated: >60 mL/min (ref >60)
Glucose, Bld: 317 mg/dL — ABNORMAL HIGH (ref 70–99)
Potassium: 4 mmol/L (ref 3.5–5.1)
Sodium: 140 mmol/L (ref 135–145)

## 2022-02-12 LAB — CBC
HCT: 29.3 % — ABNORMAL LOW (ref 39.0–52.0)
Hemoglobin: 8.8 g/dL — ABNORMAL LOW (ref 13.0–17.0)
MCH: 24.4 pg — ABNORMAL LOW (ref 26.0–34.0)
MCHC: 30 g/dL (ref 30.0–36.0)
MCV: 81.2 fL (ref 80.0–100.0)
Platelets: 453 10*3/uL — ABNORMAL HIGH (ref 150–400)
RBC: 3.61 MIL/uL — ABNORMAL LOW (ref 4.22–5.81)
RDW: 16.4 % — ABNORMAL HIGH (ref 11.5–15.5)
WBC: 8.8 10*3/uL (ref 4.0–10.5)
nRBC: 0 % (ref 0.0–0.2)

## 2022-02-12 MED ORDER — ACETAMINOPHEN 500 MG PO TABS
1000.0000 mg | ORAL_TABLET | Freq: Four times a day (QID) | ORAL | Status: DC
  Administered 2022-02-12 – 2022-02-14 (×9): 1000 mg via ORAL
  Filled 2022-02-12 (×9): qty 2

## 2022-02-12 MED ORDER — OXYCODONE HCL 5 MG PO TABS
10.0000 mg | ORAL_TABLET | ORAL | Status: DC | PRN
  Administered 2022-02-12 – 2022-02-14 (×10): 10 mg via ORAL
  Filled 2022-02-12 (×10): qty 2

## 2022-02-12 NOTE — Progress Notes (Signed)
MEWS yellow Waiting on Cardizem 420 mg PO, that is scheduled daily. Pulse is 105 and patient is asymptomatic. The pulse rate turned the MEWs scale yellow.

## 2022-02-12 NOTE — Plan of Care (Signed)
  Problem: Coping: Goal: Ability to adjust to condition or change in health will improve Outcome: Progressing   Problem: Fluid Volume: Goal: Ability to maintain a balanced intake and output will improve Outcome: Progressing   Problem: Health Behavior/Discharge Planning: Goal: Ability to identify and utilize available resources and services will improve Outcome: Progressing Goal: Ability to manage health-related needs will improve Outcome: Progressing   Problem: Metabolic: Goal: Ability to maintain appropriate glucose levels will improve Outcome: Progressing   Problem: Nutritional: Goal: Maintenance of adequate nutrition will improve Outcome: Progressing Goal: Progress toward achieving an optimal weight will improve Outcome: Progressing   Problem: Skin Integrity: Goal: Risk for impaired skin integrity will decrease Outcome: Progressing   Problem: Tissue Perfusion: Goal: Adequacy of tissue perfusion will improve Outcome: Progressing   Problem: Education: Goal: Knowledge of General Education information will improve Description: Including pain rating scale, medication(s)/side effects and non-pharmacologic comfort measures Outcome: Progressing   Problem: Health Behavior/Discharge Planning: Goal: Ability to manage health-related needs will improve Outcome: Progressing   Problem: Clinical Measurements: Goal: Ability to maintain clinical measurements within normal limits will improve Outcome: Progressing Goal: Will remain free from infection Outcome: Progressing

## 2022-02-12 NOTE — Progress Notes (Signed)
Pharmacy Antibiotic Note  [name] a 62 y.o. male admitted with a diabetic foot infection. He has had 7 different procedures (total) of both his R and L foot. He presented with a possible surgical site infection following a L partal fourth toe amputation. Pharmacy has been consulted for daptomycin and meropenem dosing.  S/p left 4th toe amputation on 1/19. Operative culture grew E coli, pansensitive.  Meropenem to change to Unasyn per ID.  Last CK 1/19 = 49  Plan: Unasyn 3gm IV q6h. Continue Daptomycin 700 mg (~8 mg/kg) IV q24h Weekly CK on Fridays.  Height: '5\' 9"'$  (175.3 cm) Weight: 86 kg (189 lb 9.5 oz) IBW/kg (Calculated) : 70.7  Temp (24hrs), Avg:98.7 F (37.1 C), Min:97.6 F (36.4 C), Max:99.9 F (37.7 C)  Recent Labs  Lab 02/06/22 1113 02/06/22 1929 02/07/22 0419 02/08/22 0338 02/09/22 0348 02/10/22 0315 02/11/22 0241 02/12/22 0253  WBC 13.5*  --  11.6* 11.7* 10.4  --  8.8 8.8  CREATININE 0.79  --  0.85 0.84 0.90 0.78 0.73 0.89  LATICACIDVEN 0.7 0.7  --   --   --   --   --   --      Estimated Creatinine Clearance: 95.9 mL/min (by C-G formula based on SCr of 0.89 mg/dL).    Allergies  Allergen Reactions   Latex Rash and Other (See Comments)    Redness     Antimicrobials this admission: Ceftriaxone 1/18>>1/19 flagyl 1/18>>1/19 Daptomycin 1/19>> Meropenem 1/19>>1/21 Unasyn 1/21 >>    Dose adjustments this admission: n/a  Microbiology results: 05/24/21 R foot bone cx: ESBL E coli, E faecalis, Bacteroides caccae (beta-lactamase positive)   1/18 Blood: ng  1/18 surgcial cx (tissue L foot): few E coli - pansensitive, no anaerobes isolated 1/19 MRSA PCR: neg   Thank you for allowing pharmacy to be a part of this patient's care.  [place] Crane, Roylene Reason, BCCP Clinical Pharmacist  02/12/2022 2:05 PM   [facility] pharmacy phone numbers are listed on [email or web address]

## 2022-02-12 NOTE — Evaluation (Addendum)
Physical Therapy Evaluation Patient Details Name: [name] MRN: [id number] DOB: October 05, 1960 Today's Date: 02/12/2022  History of Present Illness  Pt is a 62 y.o. male who presented 02/06/22 with a left diabetic foot infection. S/p L 4th toe amputation 1/19, aortogram with bilateral lower extremity angiogram 1/22, and left transmetatarsal amputation and achilles tendon lengthening 1/23. PMH: AKI, DM, GERD, HTN, neuropathy, R trasmet amputation   Clinical Impression  Pt presents with condition above and deficits mentioned below, see PT Problem List. PTA, he was mod I using a walking stick for functional mobility. Pt's girlfriend intermittently lives with him in his 1-level house with a ramped entrance. Currently, pt demonstrates deficits in L ankle dorsiflexion ROM, L anterior tibialis strength, balance, and activity tolerance. Pt required minA to squat pivot from the bed to the chair today. He is limited by L lower extremity pain, but pt will likely progress well. Encouraged pt to perform AROM into dorsiflexion as HEP. Coordinated with ortho tech in regards to his L ankle splint. Recommending follow-up with HHPT as pt reports he has family that can provide the level of care/assistance he needs. Will continue to follow acutely.       Recommendations for follow up therapy are one component of a multi-disciplinary discharge planning process, led by the attending physician.  Recommendations may be updated based on patient status, additional functional criteria and insurance authorization.  Follow Up Recommendations Home health PT      Assistance Recommended at Discharge Intermittent Supervision/Assistance  Patient can return home with the following  A little help with walking and/or transfers;A little help with bathing/dressing/bathroom;Assistance with cooking/housework;Assist for transportation;Help with stairs or ramp for entrance    Equipment Recommendations BSC/3in1 (with drop arms)   Recommendations for Other Services       Functional Status Assessment Patient has had a recent decline in their functional status and demonstrates the ability to make significant improvements in function in a reasonable and predictable amount of time.     Precautions / Restrictions Precautions Precautions: Fall Precaution Comments: L foot wound vac Restrictions Weight Bearing Restrictions: Yes LLE Weight Bearing: Non weight bearing Other Position/Activity Restrictions: Multi-Podus AFO (awaiting arrival)      Mobility  Bed Mobility Overal bed mobility: Needs Assistance Bed Mobility: Supine to Sit     Supine to sit: Supervision, HOB elevated     General bed mobility comments: Supervision for safety and line management, HOB elevated    Transfers Overall transfer level: Needs assistance Equipment used: None Transfers: Bed to chair/wheelchair/BSC       Squat pivot transfers: Min assist     General transfer comment: Pt cued to keep L foot off floor and push through UEs to squat pivot along EOB 2x then to transfer into chair to his R with x1 final big pivot, minA.    Ambulation/Gait               General Gait Details: unable  Stairs            Wheelchair Mobility    Modified Rankin (Stroke Patients Only)       Balance Overall balance assessment: Needs assistance Sitting-balance support: No upper extremity supported, Feet supported Sitting balance-Leahy Scale: Good         Standing balance comment: deferred today                             Pertinent Vitals/Pain Pain Assessment Pain  Assessment: Faces Faces Pain Scale: Hurts worst Pain Location: L foot Pain Descriptors / Indicators: Discomfort, Grimacing, Operative site guarding, Moaning, Throbbing Pain Intervention(s): Limited activity within patient's tolerance, Monitored during session, Repositioned, RN gave pain meds during session (RN gave IV pain meds prior to mobilizing)     Home Living Family/patient expects to be discharged to:: Private residence Living Arrangements: Spouse/significant other (lives with him part time) Available Help at Discharge: Family;Available 24 hours/day Type of Home: House Home Access: Ramped entrance       Home Layout: One level Home Equipment: Conservation officer, nature (2 wheels);Rollator (4 wheels);Wheelchair - Nurse, learning disability - single point      Prior Function Prior Level of Function : Independent/Modified Independent;Driving             Mobility Comments: Uses walking stick; no falls       Hand Dominance        Extremity/Trunk Assessment   Upper Extremity Assessment Upper Extremity Assessment: Overall WFL for tasks assessed    Lower Extremity Assessment Lower Extremity Assessment: RLE deficits/detail;LLE deficits/detail RLE Deficits / Details: hx of transmet amputation; hx of peripheral neuropathy RLE Sensation: history of peripheral neuropathy LLE Deficits / Details: transmet amputation 1/23; hx of peripheral neuropathy; edema noted; noted weakness in anterior tibialis (2+/5 MMT) with ankle resting in plantarflexion - coordinated with ortho tech for ordered splint LLE Sensation: history of peripheral neuropathy    Cervical / Trunk Assessment Cervical / Trunk Assessment: Normal  Communication   Communication: No difficulties  Cognition Arousal/Alertness: Awake/alert Behavior During Therapy: WFL for tasks assessed/performed Overall Cognitive Status: Within Functional Limits for tasks assessed                                          General Comments General comments (skin integrity, edema, etc.): VSS; encouraged AROM of L ankle into dorsiflexion    Exercises     Assessment/Plan    PT Assessment Patient needs continued PT services  PT Problem List Decreased strength;Decreased range of motion;Decreased activity tolerance;Decreased balance;Decreased mobility;Decreased skin  integrity;Pain       PT Treatment Interventions DME instruction;Gait training;Functional mobility training;Therapeutic activities;Therapeutic exercise;Balance training;Neuromuscular re-education;Wheelchair mobility training;Patient/family education    PT Goals (Current goals can be found in the Care Plan section)  Acute Rehab PT Goals Patient Stated Goal: to reduce pain PT Goal Formulation: With patient Time For Goal Achievement: 02/26/22 Potential to Achieve Goals: Good    Frequency Min 3X/week     Co-evaluation               AM-PAC PT "6 Clicks" Mobility  Outcome Measure Help needed turning from your back to your side while in a flat bed without using bedrails?: A Little Help needed moving from lying on your back to sitting on the side of a flat bed without using bedrails?: A Little Help needed moving to and from a bed to a chair (including a wheelchair)?: A Little Help needed standing up from a chair using your arms (e.g., wheelchair or bedside chair)?: A Lot Help needed to walk in hospital room?: Total Help needed climbing 3-5 steps with a railing? : Total 6 Click Score: 13    End of Session Equipment Utilized During Treatment: Gait belt Activity Tolerance: Patient tolerated treatment well Patient left: in chair;with call bell/phone within reach;with chair alarm set Nurse Communication: Mobility status PT Visit Diagnosis: Unsteadiness  on feet (R26.81);Muscle weakness (generalized) (M62.81);Difficulty in walking, not elsewhere classified (R26.2);Pain Pain - Right/Left: Left Pain - part of body: Ankle and joints of foot    Time: 1001-1031 PT Time Calculation (min) (ACUTE ONLY): 30 min   Charges:   PT Evaluation $PT Eval Moderate Complexity: 1 Mod PT Treatments $Therapeutic Activity: 8-22 mins        Moishe Spice, PT, DPT Acute Rehabilitation Services  Office: [phone]   Orvan Falconer 02/12/2022, 11:42 AM

## 2022-02-12 NOTE — Progress Notes (Signed)
Orthopedic Tech Progress Note Patient Details:  [name] 28-Sep-1960 [id number]  Called in order this morning for a [place] for patient   Patient ID: [name], male   DOB: 08/21/1960, 62 y.o.   MRN: [id number]  [name] 02/12/2022, 11:00 AM

## 2022-02-12 NOTE — Progress Notes (Signed)
   PODIATRY PROGRESS NOTE Patient Name: [name]  DOB 1960-10-08 DOA 02/06/2022  Hospital Day: 7  Assessment:  62 y.o. male with T2DM, PAD s/p prior 4th toe amputation for osteomyltis and infection, found to have tracking abscess and concern for 4th met OM now s/p Left Trans metatarsal amputation, tendo achilles lengthening POD 1.   AF, VSS  WBC: 8.8  Wound/Bone Cultures:E coli,  Plan:  - Pt progressing well s/p L foot TMA, TAL.  - Multipodus boot ordered and is to be delivered to pt prior to discharge  - NWB to LLE - Dressing to remain clean dry intact until f/u in office - Abx per ID, thought to be source control in left foot, but recommend prophylactic abx as amputation site heals.  - Adjusted pain meds to go from [place] to Oxycodone 10 mg IR tablet every 4 hrs PRN for severe pain. Continue with tylenol 1000 mg q6 hrs scheduled Will continue to follow        Everitt Amber, [place]    Subjective:  Pt seen bedside, states he has a lot of throbbing pain in left foot. Aware of NWB status to left foot. Says the boot has not yet been delivered but told him it will be.   Objective:   Vitals:   02/12/22 1100 02/12/22 1140  BP: (!) 146/76 (!) 146/76  Pulse:  (!) 106  Resp:    Temp:  98.7 F (37.1 C)  SpO2: 100% 100%       Latest Ref Rng & Units 02/12/2022    2:53 AM 02/11/2022    2:41 AM 02/09/2022    3:48 AM  CBC  WBC 4.0 - 10.5 K/uL 8.8  8.8  10.4   Hemoglobin 13.0 - 17.0 g/dL 8.8  9.4  10.2   Hematocrit 39.0 - 52.0 % 29.3  29.6  32.2   Platelets 150 - 400 K/uL 453  403  358        Latest Ref Rng & Units 02/12/2022    2:53 AM 02/11/2022    2:41 AM 02/10/2022    3:15 AM  BMP  Glucose 70 - 99 mg/dL 317  171  90   BUN 6 - 20 mg/dL '14  11  14   '$ Creatinine 0.61 - 1.24 mg/dL 0.89  0.73  0.78   Sodium 135 - 145 mmol/L 140  139  141   Potassium 3.5 - 5.1 mmol/L 4.0  3.6  3.3   Chloride 98 - 111 mmol/L 104  107  107   CO2 22 - 32  mmol/L '26  23  24   '$ Calcium 8.9 - 10.3 mg/dL 8.5  8.3  8.4     General: AAOx3, NAD  Lower Extremity Exam Vasc: L food Dressing C/D/I  Derm: Dressing and disposable wound vac in place and functioning.  MSK:  L- S/p TMA LLE  Neuro: R - Gross sensation diminished. Gross motor function intact   L - Gross sensation diminished, pain at the surgical site.J[place] motor function intact   Radiology:  Results reviewed. See assessment for pertinent imaging results

## 2022-02-12 NOTE — Op Note (Signed)
Patient Name: [name] DOB: 01-18-1961  MRN: [id number]   Date of Service: 02/06/2022 - 02/07/2022  Surgeon: Dr. Lanae Crumbly, DPM Assistants: None Pre-operative Diagnosis:  Osteomyelitis left foot Peripheral arterial disease Post-operative Diagnosis:  Osteomyelitis left foot Peripheral arterial disease Procedures:  1) transmetatarsal amputation left foot  2) Achilles tendon lengthening left foot Pathology/Specimens: ID Type Source Tests Collected by Time Destination  1 : Left Forefoot Tissue PATH Amputaion Arm/Leg SURGICAL PATHOLOGY Criselda Peaches, DPM 02/11/2022 1725    Anesthesia: Regional block and sedation Hemostasis: * No tourniquets in log * Estimated Blood Loss: 10 mL Materials: * No implants in log * Medications: None Complications: None  Indications for Procedure:  This is a 62 y.o. male with a history of type 2 diabetes peripheral arterial disease who recently underwent left fourth toe amputation for osteomyelitis and infection.  He underwent peripheral angiography and perfusion was noted to be supplied only by a single peroneal artery with collateralization through the foot.  After discussing with the patient the risks of nonhealing of solitary partial ray amputation elected for transmetatarsal amputation which he has had on his other foot.  All risk benefits and potential complications were discussed prior to surgery.  All questions were addressed.   Procedure in Detail: Patient was identified in pre-operative holding area. Formal consent was signed and the left lower extremity was marked. Patient was brought back to the operating room. Anesthesia was induced. The extremity was prepped and draped in the usual sterile fashion. Timeout was taken to confirm patient name, laterality, and procedure prior to incision.   Attention was then directed to the left foot where a fishmouth style incision was made encompassing the distal forefoot.  This was carried deep  through subcutaneous tissues, cauterizing bleeding vessels as necessary.  It was carried deep to the level of bone and elevators were used to raise the dorsal soft tissues from the metatarsals.  A sagittal saw was used to create a transmetatarsal amputation in standard fashion.  The soft tissues were resected of the distal forefoot from the plantar flap and the forefoot was sent for pathology.  The wound was thoroughly irrigated.  Hemostasis was achieved.  No further purulence or necrosis was found.  It was then closed in layers using 2-0 PDS, 3-0 Monocryl, 3-0 nylon, 2-0 nylon and skin staples.  I then directed my attention to the posterior Achilles where a Hoke triple hemisection percutaneously was created using a #11 blade.  This released the Achilles contracture.  This was closed with staples.  The foot was then dressed with Adaptic and a Prevena incisional wound VAC. Patient tolerated the procedure well.   Disposition: Following a period of post-operative monitoring, patient will be transferred to the floor.  He will remain nonweightbearing on the left lower extremity.  A Multi-Podus AFO has been ordered to be worn on the left side.

## 2022-02-12 NOTE — Telephone Encounter (Signed)
Patient is calling because medicine given at hospital is not helping with his pain, "it's killing me."Can something else be prescribed, please call him at this number to speak with [phone].

## 2022-02-12 NOTE — Progress Notes (Signed)
PROGRESS NOTE    [name]  WLN:[id number] DOB: 15-Jan-1961 DOA: 02/06/2022 PCP: [name], MD   Brief Narrative:  Patient is a 62 year old Hispanic male with past medical history significant for diabetes mellitus type 2, hypertension, chronic pain, spinal stenosis as well as history of recurrent foot infections status post 7 different toe amputations in the right and left feet who came to the hospital with concern for left foot infection.  Had a partial left fourth toe amputation 1 to 2 weeks ago and was placed on oral antibiotics there is concern about him developing infection at the surgical site with increased redness, pain, as well as other issues so he came back to the hospital for IV antibiotics and repeat surgery.  Patient underwent a left metatarsal amputation along with a tendo Achilles lengthening and is postoperative day 1 and initially was taken to the OR for left fourth toe amputation at the metatarsal joint as well as I&D of the abscess of the tendon sheath with wide debridement and multiple fascial planes.  Antibiotics are swelling pansensitive E. coli, Prevotella Bivia and rare gram-positive rods.  Infectious disease was consulted and he has been placed on IV Unasyn and IV daptomycin.  He is currently having significant pain and will need further ambulation with PT and OT to evaluate for safe discharge disposition but currently they are recommending Home Health  Assessment and Plan: No notes have been filed under this hospital service. Service: Hospitalist  Diabetic foot infection, concern for osteomyelitis/cellulitis and is status post left transmetatarsal amputation with a tendo Achilles lengthening postoperative day 1 -On the left foot.  He has been having several surgeries to the right foot, now on the left. -Continue IV antibiotics with IV Daptomycin and IV Unasyn -Podiatry consulted, he was taken to the OR on 1/19 and is status post left fourth toe amputation at  the metatarsophalangeal joint as well as I&D of abscess tendon sheath with wide debridement and multiple fascial planes.  -He underwent repeat surgery 02/11/22 for transmetatarsal amputation -ID also consulted, intraoperative microbiology shows pansensitive E. coli, rare GPC in pairs, continue antibiotics per ID, currently on Unasyn and daptomycin -Cx from 1/19 done with moderate gram-positive rods, few S E. coli, moderate Prevotella BV a beta-lactamase positive; E. coli was pansensitive -Blood Cx x2 showed NGTD at 5 days  -Vascular consulted, underwent angiogram 1/22 which showed bilateral single-vessel peroneal outflow with bilateral foot filling through collaterals.   -Vascular feels like he is currently optimized. -Continue ASA 81 mg po Daily and Atorvastatin 40 mg po Daily -Pain Control as below  -Podiatry managing following recommending Multi-Podus boot as well as nonweightbearing to left lower extremity as well as keeping dressing intact until follow-up in office -Antibiotic management per ID however the podiatrist feels that he has worse control in the left foot but recommending prophylactic antibiotics as amputation site heals -PT OT recommending home health   Type 2 diabetes mellitus -Continue Semglee 30 units BID and Moderate Novolog SSI AC -CBGs ranging from 91-249  Essential Hypertension -Continue Amlodipine 5 mg po daily, Diltiazem 420 mg po Daily, Losartan 100 mg po Daily, and Furosemide 20 mg po Daily. -Will discontinue IVF hydration with NS at 100 mL/hr -C/w Hydralazine 5 mg IV q20 min PRN HBP x 2 Doses and IV Labetalol 10 mg IV q10 minPRN HBP x 4 doses -Continue to Monitor BP per Protocol  -Last BP reading was 146/76  Anxiety -Continue home medications with Buproprion 300 mg po daily  and Alprazolam 0.5 mg po BID   Chronic pain, spinal stenosis -Continue home medications with Oxycodone 10 mg po q4hprn Severe Pain, and C/w Gabapentin 800 mg po TID, and Hydromorphone 0.5-1 mg  q2hprn IV Severe Breakthrough Pain  -Also getting Acetaminophen 1000 mg po q6h  Thrombocytosis -Likely Reactive -Plt Count Trend: Recent Labs  Lab 02/06/22 1113 02/07/22 0419 02/08/22 0338 02/09/22 0348 02/11/22 0241 02/12/22 0253  PLT 373 357 330 358 403* 453*  -Continue to Monitor for S/Sx of bleeding; No overt bleeding noted -Repeat CBC in the AM   Elevated ALT -LFT Trend: Recent Labs  Lab 02/06/22 1113 02/08/22 0338 02/11/22 0241  AST 20 18 40  ALT 27 21 54*  -Continue to Monitor and Trend and repeat CMP in the AM   Hypoalbuminemia -Albumin Level Trend: Recent Labs  Lab 02/06/22 1113 02/08/22 0338 02/11/22 0241  ALBUMIN 2.7* 2.2* 2.2*  -Continue to Monitor and Trend and repeat CMP in the AM   Hypokalemia, improved -K+ Trend: Recent Labs  Lab 02/06/22 1113 02/07/22 0419 02/08/22 0338 02/09/22 0348 02/10/22 0315 02/11/22 0241 02/12/22 0253  K 3.3* 3.8 3.7 3.5 3.3* 3.6 4.0  -Continue to Monitor and Replete as Necessary -Repeat CMP in the AM    Anemia of Chronic Disease/Microcytic Anemia -Hgb/Hct Trend: Recent Labs  Lab 02/06/22 1113 02/07/22 0419 02/08/22 0338 02/09/22 0348 02/11/22 0241 02/12/22 0253  HGB 10.4* 10.1* 9.7* 10.2* 9.4* 8.8*  HCT 32.6* 31.5* 30.4* 32.2* 29.6* 29.3*  MCV 78.6* 78.4* 78.4* 80.3 78.9* 81.2  -Check Anemia Panel in the AM -Continue to Monitor for S/Sx of Bleeding; No overt bleeding noted -Repeat CBC int eh AM   Overweight -Complicates overall prognosis and care -Estimated body mass index is 28 kg/m as calculated from the following:   Height as of this encounter: '5\' 9"'$  (1.753 m).   Weight as of this encounter: 86 kg.  -Weight Loss and Dietary Counseling given  DVT prophylaxis: heparin injection 5,000 Units Start: 02/10/22 2200 SCDs Start: 02/06/22 1745    Code Status: Full Code Family Communication: No family present at bedside   Disposition Plan:  Level of care: Progressive Cardiac Status is:  Inpatient Remains inpatient appropriate because: Needs further clinical improvement and Pain control as well as Podiatry and ID Clearance prior to D/C    Consultants:  Podiatry Infectious Diseases Vascular Surgery   Procedures:  Procedures by Dr. Lanae Crumbly on 02/11/22:             1) transmetatarsal amputation left foot             2) Achilles tendon lengthening left foot  Procedure Performed Dr. Orlie Pollen on 02/10/22: 1.  Ultrasound-guided micropuncture access of the right common femoral artery 2.  Aortogram 3.  Bilateral lower extremity angiogram 4.  Third cannulation, left lower extremity angiogram 5.  Moderate sedation time 50 minutes 6.  Contrast volume 175 mL 7.  Device assisted closure-Mynx  Procedures by Dr. Lanae Crumbly on 02/07/22:             1) amputation of the left fourth toe at metatarsophalangeal joint             2) I&D of abscess tendon sheath with wide debridement in multiple fascial planes  Antimicrobials:  Anti-infectives (From admission, onward)    Start     Dose/Rate Route Frequency Ordered Stop   02/09/22 1700  ampicillin-sulbactam (UNASYN) 1.5 g in sodium chloride 0.9 % 100 mL IVPB  Status:  Discontinued        1.5 g 200 mL/hr over 30 Minutes Intravenous Every 6 hours 02/09/22 1525 02/09/22 1525   02/09/22 1700  Ampicillin-Sulbactam (UNASYN) 3 g in sodium chloride 0.9 % 100 mL IVPB        3 g 200 mL/hr over 30 Minutes Intravenous Every 6 hours 02/09/22 1525     02/07/22 2200  meropenem (MERREM) 1 g in sodium chloride 0.9 % 100 mL IVPB  Status:  Discontinued        1 g 200 mL/hr over 30 Minutes Intravenous Every 8 hours 02/07/22 1328 02/09/22 1418   02/07/22 1811  vancomycin (VANCOCIN) powder  Status:  Discontinued          As needed 02/07/22 1812 02/07/22 1822   02/07/22 1030  DAPTOmycin (CUBICIN) 700 mg in sodium chloride 0.9 % IVPB        8 mg/kg  85.3 kg 128 mL/hr over 30 Minutes Intravenous Daily 02/07/22 0937     02/07/22 0800   vancomycin (VANCOREADY) IVPB 1250 mg/250 mL  Status:  Discontinued        1,250 mg 166.7 mL/hr over 90 Minutes Intravenous Every 12 hours 02/06/22 1700 02/06/22 1700   02/07/22 0600  cefTRIAXone (ROCEPHIN) 2 g in sodium chloride 0.9 % 100 mL IVPB  Status:  Discontinued       See Hyperspace for full Linked Orders Report.   2 g 200 mL/hr over 30 Minutes Intravenous Every 24 hours 02/06/22 1752 02/07/22 1328   02/07/22 0600  metroNIDAZOLE (FLAGYL) IVPB 500 mg  Status:  Discontinued       See Hyperspace for full Linked Orders Report.   500 mg 100 mL/hr over 60 Minutes Intravenous Every 12 hours 02/06/22 1752 02/07/22 1328   02/06/22 1715  vancomycin (VANCOREADY) IVPB 1500 mg/300 mL  Status:  Discontinued        1,500 mg 150 mL/hr over 120 Minutes Intravenous  Once 02/06/22 1700 02/06/22 1700   02/06/22 1700  cefTRIAXone (ROCEPHIN) 2 g in sodium chloride 0.9 % 100 mL IVPB  Status:  Discontinued       See Hyperspace for full Linked Orders Report.   2 g 200 mL/hr over 30 Minutes Intravenous Every 24 hours 02/06/22 1657 02/06/22 1750   02/06/22 1700  metroNIDAZOLE (FLAGYL) IVPB 500 mg  Status:  Discontinued       See Hyperspace for full Linked Orders Report.   500 mg 100 mL/hr over 60 Minutes Intravenous Every 12 hours 02/06/22 1657 02/06/22 1750       Subjective: Seen and examined at bedside and states that his foot pain was extremely bad and states that he did not rest because of the pain in his foot.  No nausea or vomiting.  Denies any other pain anywhere else.  Asking for some pain medication.  Denies any lightheadedness or dizziness.  No other concerns or complaints at this time.  Objective: Vitals:   02/12/22 0307 02/12/22 0310 02/12/22 0428 02/12/22 0800  BP: 137/71 137/71 (!) 155/75 134/76  Pulse: (!) 108 (!) 108 (!) 108 (!) 107  Resp: '18 18  17  '$ Temp: 99.5 F (37.5 C) 99.5 F (37.5 C)  99.9 F (37.7 C)  TempSrc:  Oral  Oral  SpO2:  99%  97%  Weight:      Height:         Intake/Output Summary (Last 24 hours) at 02/12/2022 0813 Last data filed at 02/12/2022 0659 Gross per 24 hour  Intake 1548 ml  Output 2885 ml  Net -1337 ml   Filed Weights   02/06/22 1103 02/11/22 1607  Weight: 85.3 kg 86 kg   Examination: Physical Exam:  Constitutional: WN/WD overweight Hispanic male in some acute distress and appears uncomfortable complaining of some pain in his left foot Respiratory: Diminished to auscultation bilaterally, no wheezing, rales, rhonchi or crackles. Normal respiratory effort and patient is not tachypenic. No accessory muscle use.  Unlabored breathing Cardiovascular: RRR, no murmurs / rubs / gallops. S1 and S2 auscultated. No extremity edema.  Abdomen: Soft, non-tender, distended secondary to body habitus.  Bowel sounds positive.  GU: Deferred. Musculoskeletal: Has a left transmetatarsal amputation with connected to wound VAC Skin: No rashes, lesions, ulcers on limited skin evaluation. No induration; Warm and dry.  Neurologic: CN 2-12 grossly intact with no focal deficits. Romberg sign and cerebellar reflexes not assessed.  Psychiatric: Normal judgment and insight. Alert and oriented x 3. Normal mood and appropriate affect.   Data Reviewed: I have personally reviewed following labs and imaging studies  CBC: Recent Labs  Lab 02/06/22 1113 02/07/22 0419 02/08/22 0338 02/09/22 0348 02/11/22 0241 02/12/22 0253  WBC 13.5* 11.6* 11.7* 10.4 8.8 8.8  NEUTROABS 10.7*  --   --   --   --   --   HGB 10.4* 10.1* 9.7* 10.2* 9.4* 8.8*  HCT 32.6* 31.5* 30.4* 32.2* 29.6* 29.3*  MCV 78.6* 78.4* 78.4* 80.3 78.9* 81.2  PLT 373 357 330 358 403* 250*   Basic Metabolic Panel: Recent Labs  Lab 02/06/22 1929 02/07/22 0419 02/08/22 0338 02/09/22 0348 02/10/22 0315 02/11/22 0241 02/12/22 0253  NA  --    < > 137 138 141 139 140  K  --    < > 3.7 3.5 3.3* 3.6 4.0  CL  --    < > 104 103 107 107 104  CO2  --    < > '22 25 24 23 26  '$ GLUCOSE  --    < > 188*  209* 90 171* 317*  BUN  --    < > '13 15 14 11 14  '$ CREATININE  --    < > 0.84 0.90 0.78 0.73 0.89  CALCIUM  --    < > 8.4* 8.5* 8.4* 8.3* 8.5*  MG 1.8  --  1.7 2.0  --  2.0  --    < > = values in this interval not displayed.   GFR: Estimated Creatinine Clearance: 95.9 mL/min (by C-G formula based on SCr of 0.89 mg/dL). Liver Function Tests: Recent Labs  Lab 02/06/22 1113 02/08/22 0338 02/11/22 0241  AST 20 18 40  ALT 27 21 54*  ALKPHOS 120 91 102  BILITOT 0.4 <0.1* 0.1*  PROT 6.6 5.4* 5.5*  ALBUMIN 2.7* 2.2* 2.2*   No results for input(s): "LIPASE", "AMYLASE" in the last 168 hours. No results for input(s): "AMMONIA" in the last 168 hours. Coagulation Profile: No results for input(s): "INR", "PROTIME" in the last 168 hours. Cardiac Enzymes: Recent Labs  Lab 02/07/22 0958  CKTOTAL 49   BNP (last 3 results) No results for input(s): "PROBNP" in the last 8760 hours. HbA1C: No results for input(s): "HGBA1C" in the last 72 hours. CBG: Recent Labs  Lab 02/11/22 1557 02/11/22 1659 02/11/22 1813 02/11/22 2124 02/12/22 0605  GLUCAP 73 81 88 137* 220*   Lipid Profile: Recent Labs    02/11/22 0241  CHOL 84  HDL 24*  LDLCALC 40  TRIG 102  CHOLHDL  3.5   Thyroid Function Tests: No results for input(s): "TSH", "T4TOTAL", "FREET4", "T3FREE", "THYROIDAB" in the last 72 hours. Anemia Panel: No results for input(s): "VITAMINB12", "FOLATE", "FERRITIN", "TIBC", "IRON", "RETICCTPCT" in the last 72 hours. Sepsis Labs: Recent Labs  Lab 02/06/22 1113 02/06/22 1929  LATICACIDVEN 0.7 0.7    Recent Results (from the past 240 hour(s))  WOUND CULTURE     Status: Abnormal   Collection Time: 02/06/22 10:14 AM   Specimen: Abscess; Wound  Result Value Ref Range Status   MICRO NUMBER: [id number]  Final   SPECIMEN QUALITY: Adequate  Final   SOURCE: LEFT TOE  Final   STATUS: FINAL  Final   GRAM STAIN:   Final    No white blood cells seen No epithelial cells seen Many Gram negative  bacilli Few Gram positive cocci in pairs   ISOLATE 1: Escherichia coli (A)  Final    Comment: Heavy growth of Escherichia coli      Susceptibility   Escherichia coli - AEROBIC CULT, GRAM STAIN NEGATIVE 1    AMOX/CLAVULANIC <=2 Sensitive     AMPICILLIN 8 Sensitive     AMPICILLIN/SULBACTAM <=2 Sensitive     CEFAZOLIN* <=4 Not Reportable      * For infections other than uncomplicated UTI caused by E. coli, K. pneumoniae or P. mirabilis: Cefazolin is resistant if MIC > or = 8 mcg/mL. (Distinguishing susceptible versus intermediate for isolates with MIC < or = 4 mcg/mL requires additional testing.)     CEFTAZIDIME <=1 Sensitive     CEFEPIME <=1 Sensitive     CEFTRIAXONE <=1 Sensitive     CIPROFLOXACIN <=0.25 Sensitive     LEVOFLOXACIN <=0.12 Sensitive     GENTAMICIN <=1 Sensitive     IMIPENEM <=0.25 Sensitive     PIP/TAZO <=4 Sensitive     TOBRAMYCIN <=1 Sensitive     TRIMETH/SULFA* <=20 Sensitive      * For infections other than uncomplicated UTI caused by E. coli, K. pneumoniae or P. mirabilis: Cefazolin is resistant if MIC > or = 8 mcg/mL. (Distinguishing susceptible versus intermediate for isolates with MIC < or = 4 mcg/mL requires additional testing.) Legend: S = Susceptible  I = Intermediate R = Resistant  NS = Not susceptible * = Not tested  NR = Not reported **NN = See antimicrobic comments   Blood culture (routine x 2)     Status: None   Collection Time: 02/06/22 11:25 AM   Specimen: BLOOD  Result Value Ref Range Status   Specimen Description BLOOD BLOOD RIGHT FOREARM  Final   Special Requests   Final    BOTTLES DRAWN AEROBIC AND ANAEROBIC Blood Culture adequate volume   Culture   Final    NO GROWTH 5 DAYS Performed at [facility], [place] [address] [place], [place] [address]    Report Status 02/11/2022 FINAL  Final  Blood culture (routine x 2)     Status: None   Collection Time: 02/06/22 11:27 AM   Specimen: BLOOD  Result Value Ref Range Status    Specimen Description BLOOD BLOOD LEFT FOREARM  Final   Special Requests   Final    BOTTLES DRAWN AEROBIC AND ANAEROBIC Blood Culture adequate volume   Culture   Final    NO GROWTH 5 DAYS Performed at [facility], [place] [address], [place], [place] [address]    Report Status 02/11/2022 FINAL  Final  Surgical pcr screen     Status: None  Collection Time: 02/07/22  3:07 PM  Result Value Ref Range Status   MRSA, PCR NEGATIVE NEGATIVE Final   Staphylococcus aureus NEGATIVE NEGATIVE Final    Comment: (NOTE) The Xpert SA Assay (FDA approved for NASAL specimens in patients 39 years of age and older), is one component of a comprehensive surveillance program. It is not intended to diagnose infection nor to guide or monitor treatment. Performed at [facility], [place] [address], [place], [place] [address]   Aerobic/Anaerobic Culture w Gram Stain (surgical/deep wound)     Status: None (Preliminary result)   Collection Time: 02/07/22  6:06 PM   Specimen: Soft Tissue, Other  Result Value Ref Range Status   Specimen Description TISSUE LEFT FOOT  Final   Special Requests SWAB OF DEEP TISSUE PT ON DACTOMYCIN  Final   Gram Stain   Final    RARE WBC PRESENT, PREDOMINANTLY PMN MODERATE GRAM POSITIVE RODS RARE GRAM POSITIVE COCCI IN PAIRS Performed at [facility], [place] [address] [place],  [address]    Culture   Final    FEW ESCHERICHIA COLI NO ANAEROBES ISOLATED; CULTURE IN PROGRESS FOR 5 DAYS    Report Status PENDING  Incomplete   Organism ID, Bacteria ESCHERICHIA COLI  Final      Susceptibility   Escherichia coli - MIC*    AMPICILLIN 4 SENSITIVE Sensitive     CEFAZOLIN <=4 SENSITIVE Sensitive     CEFEPIME <=0.12 SENSITIVE Sensitive     CEFTAZIDIME <=1 SENSITIVE Sensitive     CEFTRIAXONE <=0.25 SENSITIVE Sensitive     CIPROFLOXACIN <=0.25 SENSITIVE Sensitive     GENTAMICIN <=1 SENSITIVE Sensitive     IMIPENEM <=0.25 SENSITIVE Sensitive     TRIMETH/SULFA <=20  SENSITIVE Sensitive     AMPICILLIN/SULBACTAM <=2 SENSITIVE Sensitive     PIP/TAZO <=4 SENSITIVE Sensitive     * FEW ESCHERICHIA COLI    Radiology Studies: PERIPHERAL VASCULAR CATHETERIZATION  Result Date: 02/10/2022 Images from the original result were not included. Patient name: [name] MRN: [id number] DOB: 15-Aug-1960 Sex: male 02/10/2022 Pre-operative Diagnosis: Left lower extremity critical ischemia with tissue loss in the foot Post-operative diagnosis:  Same Surgeon:  Broadus John, MD Procedure Performed: 1.  Ultrasound-guided micropuncture access of the right common femoral artery 2.  Aortogram 3.  Bilateral lower extremity angiogram 4.  Third cannulation, left lower extremity angiogram 5.  Moderate sedation time 50 minutes 6.  Contrast volume 175 mL 7.  Device assisted closure-Mynx Indications: Patient is a 63 year old male with longstanding diabetes who presented to the hospital with new onset wounds on the left lower extremity.  On exam, he had nonpalpable pulses.  After discussing the risk and benefits of left lower extremity angiography in an effort to define and improve distal perfusion for wound healing, [name] elected to proceed. Findings: Aortogram: No flow-limiting stenosis appreciated in the aortoiliac segments bilaterally On the right: Widely patent common femoral artery, profunda, superficial femoral artery, popliteal artery.  Single-vessel, peroneal artery outflow with occlusion of the anterior tibial and posterior tibial arteries.  The foot fills via collaterals. On the left: Widely patent common femoral artery, profunda, superficial femoral artery, popliteal artery.  Single-vessel peroneal artery outflow with atretic posterior tibial and anterior tibial arteries.  These occluded at the mid tibia.  The peroneal artery continues into the foot through diffuse collaterals.  Procedure:  The patient was identified in the holding area and taken to room 8.  The patient was then placed  supine on the table and prepped and draped in the usual sterile fashion.  A time out was called.  Ultrasound was used to evaluate the right common femoral artery.  It was patent .  A digital ultrasound image was acquired.  A micropuncture needle was used to access the right common femoral artery under ultrasound guidance.  An 018 wire was advanced without resistance and a micropuncture sheath was placed.  The 018 wire was removed and a benson wire was placed.  The micropuncture sheath was exchanged for a 5 french sheath.  An omniflush catheter was advanced over the wire to the level of L-1.  An abdominal angiogram was obtained.  The Omni Flush catheter was pulled down to the terminal aorta for bilateral lower extremity runoff.  See results above.  In an effort to improve imaging quality, a wire was used to bring the Omni Flush up and over into the left superficial femoral artery to assess flow at the level of the ankle and foot.  See results above.  Impression: Diagnostic angiogram with brisk, single-vessel  peroneal outflow to the feet bilaterally. There is diffuse collateralization in the left foot. No names vessels. Cassandria Santee, MD [facility] Office: [phone]    Scheduled Meds:  amLODipine  5 mg Oral Daily   aspirin EC  81 mg Oral Q breakfast   atorvastatin  40 mg Oral Daily   buPROPion  300 mg Oral Daily   diltiazem  420 mg Oral Daily   fentaNYL (SUBLIMAZE) injection  50 mcg Intravenous Once   furosemide  20 mg Oral Daily   gabapentin  800 mg Oral TID   heparin  5,000 Units Subcutaneous Q8H   insulin aspart  0-15 Units Subcutaneous TID WC   insulin glargine-yfgn  30 Units Subcutaneous BID   losartan  100 mg Oral Daily   pantoprazole  40 mg Oral Daily   sodium chloride flush  3 mL Intravenous Q12H   sodium chloride flush  3 mL Intravenous Q12H   Continuous Infusions:  sodium chloride Stopped (02/11/22 1602)   sodium chloride     ampicillin-sulbactam  (UNASYN) IV 3 g (02/12/22 0523)   DAPTOmycin (CUBICIN) 700 mg in sodium chloride 0.9 % IVPB 700 mg (02/11/22 2024)    LOS: 6 days   Raiford Noble, DO [facility] Available via Epic secure chat 7am-7pm After these hours, please refer to coverage provider listed on [email or web address] 02/12/2022, 8:13 AM

## 2022-02-13 ENCOUNTER — Other Ambulatory Visit (HOSPITAL_COMMUNITY): Payer: Self-pay

## 2022-02-13 ENCOUNTER — Encounter: Payer: Medicare Other | Admitting: Podiatry

## 2022-02-13 DIAGNOSIS — F419 Anxiety disorder, unspecified: Secondary | ICD-10-CM | POA: Diagnosis not present

## 2022-02-13 DIAGNOSIS — E1165 Type 2 diabetes mellitus with hyperglycemia: Secondary | ICD-10-CM | POA: Diagnosis not present

## 2022-02-13 DIAGNOSIS — E11628 Type 2 diabetes mellitus with other skin complications: Secondary | ICD-10-CM | POA: Diagnosis not present

## 2022-02-13 DIAGNOSIS — T819XXD Unspecified complication of procedure, subsequent encounter: Secondary | ICD-10-CM | POA: Diagnosis not present

## 2022-02-13 LAB — COMPREHENSIVE METABOLIC PANEL
ALT: 34 U/L (ref 0–44)
AST: 17 U/L (ref 15–41)
Albumin: 2.2 g/dL — ABNORMAL LOW (ref 3.5–5.0)
Alkaline Phosphatase: 90 U/L (ref 38–126)
Anion gap: 8 (ref 5–15)
BUN: 16 mg/dL (ref 6–20)
CO2: 25 mmol/L (ref 22–32)
Calcium: 8.2 mg/dL — ABNORMAL LOW (ref 8.9–10.3)
Chloride: 104 mmol/L (ref 98–111)
Creatinine, Ser: 0.95 mg/dL (ref 0.61–1.24)
GFR, Estimated: >60 mL/min (ref >60)
Glucose, Bld: 301 mg/dL — ABNORMAL HIGH (ref 70–99)
Potassium: 3.4 mmol/L — ABNORMAL LOW (ref 3.5–5.1)
Sodium: 137 mmol/L (ref 135–145)
Total Bilirubin: <0.1 mg/dL — ABNORMAL LOW (ref 0.3–1.2)
Total Protein: 5.4 g/dL — ABNORMAL LOW (ref 6.5–8.1)

## 2022-02-13 LAB — CBC WITH DIFFERENTIAL/PLATELET
Abs Immature Granulocytes: 0.16 10*3/uL — ABNORMAL HIGH (ref 0.00–0.07)
Basophils Absolute: 0.1 10*3/uL (ref 0.0–0.1)
Basophils Relative: 1 %
Eosinophils Absolute: 0.3 10*3/uL (ref 0.0–0.5)
Eosinophils Relative: 3 %
HCT: 26.1 % — ABNORMAL LOW (ref 39.0–52.0)
Hemoglobin: 8.1 g/dL — ABNORMAL LOW (ref 13.0–17.0)
Immature Granulocytes: 2 %
Lymphocytes Relative: 18 %
Lymphs Abs: 1.9 10*3/uL (ref 0.7–4.0)
MCH: 24.6 pg — ABNORMAL LOW (ref 26.0–34.0)
MCHC: 31 g/dL (ref 30.0–36.0)
MCV: 79.3 fL — ABNORMAL LOW (ref 80.0–100.0)
Monocytes Absolute: 0.9 10*3/uL (ref 0.1–1.0)
Monocytes Relative: 9 %
Neutro Abs: 7.3 10*3/uL (ref 1.7–7.7)
Neutrophils Relative %: 67 %
Platelets: 383 10*3/uL (ref 150–400)
RBC: 3.29 MIL/uL — ABNORMAL LOW (ref 4.22–5.81)
RDW: 16.3 % — ABNORMAL HIGH (ref 11.5–15.5)
WBC: 10.6 10*3/uL — ABNORMAL HIGH (ref 4.0–10.5)
nRBC: 0 % (ref 0.0–0.2)

## 2022-02-13 LAB — GLUCOSE, CAPILLARY
Glucose-Capillary: 198 mg/dL — ABNORMAL HIGH (ref 70–99)
Glucose-Capillary: 167 mg/dL — ABNORMAL HIGH (ref 70–99)
Glucose-Capillary: 87 mg/dL (ref 70–99)
Glucose-Capillary: 211 mg/dL — ABNORMAL HIGH (ref 70–99)
Glucose-Capillary: 221 mg/dL — ABNORMAL HIGH (ref 70–99)

## 2022-02-13 LAB — MAGNESIUM: Magnesium: 2 mg/dL (ref 1.7–2.4)

## 2022-02-13 LAB — PHOSPHORUS: Phosphorus: 3.6 mg/dL (ref 2.5–4.6)

## 2022-02-13 LAB — SURGICAL PATHOLOGY

## 2022-02-13 MED ORDER — POTASSIUM CHLORIDE CRYS ER 20 MEQ PO TBCR
40.0000 meq | EXTENDED_RELEASE_TABLET | Freq: Two times a day (BID) | ORAL | Status: AC
  Administered 2022-02-13 (×2): 40 meq via ORAL
  Filled 2022-02-13 (×2): qty 2

## 2022-02-13 MED ORDER — AMOXICILLIN-POT CLAVULANATE 875-125 MG PO TABS
1.0000 | ORAL_TABLET | Freq: Two times a day (BID) | ORAL | 0 refills | Status: DC
  Filled 2022-02-13: qty 24, 12d supply, fill #0

## 2022-02-13 MED ORDER — AMOXICILLIN-POT CLAVULANATE 875-125 MG PO TABS
1.0000 | ORAL_TABLET | Freq: Two times a day (BID) | ORAL | 0 refills | Status: DC
  Filled 2022-02-13: qty 16, 8d supply, fill #0

## 2022-02-13 NOTE — Progress Notes (Signed)
PROGRESS NOTE    [name]  WEX:[id number] DOB: 03/07/1960 DOA: 02/06/2022 PCP: [name], MD   Brief Narrative:  Patient is a 62 year old Hispanic male with past medical history significant for diabetes mellitus type 2, hypertension, chronic pain, spinal stenosis as well as history of recurrent foot infections status post 7 different toe amputations in the right and left feet who came to the hospital with concern for left foot infection.  Had a partial left fourth toe amputation 1 to 2 weeks ago and was placed on oral antibiotics there is concern about him developing infection at the surgical site with increased redness, pain, as well as other issues so he came back to the hospital for IV antibiotics and repeat surgery.  Patient underwent a left metatarsal amputation along with a tendo Achilles lengthening and is postoperative day 1 and initially was taken to the OR for left fourth toe amputation at the metatarsal joint as well as I&D of the abscess of the tendon sheath with wide debridement and multiple fascial planes.  Antibiotics are swelling pansensitive E. coli, Prevotella Bivia and rare gram-positive rods.  Infectious disease was consulted and he has been placed on IV Unasyn and IV daptomycin.  He is currently having significant pain and will need further ambulation with PT and OT to evaluate for safe discharge disposition but currently they are recommending Home Health   Infectious disease followed up and [place] and now discontinue the daptomycin and they feel that given the source control with the TMA they do not feel that he needs IV antibiotics as well as a prolonged duration of antibiotics and they are planning to switch him to p.o. Augmentin upon discharge to complete 2 weeks of therapy with estimated length of treatment to be completed on 02/25/2022.  Will need to ensure that he is cleared from a podiatry standpoint for discharge but anticipating discharging in the next 24 to 48  hours otherwise.  Assessment and Plan:  Diabetic foot infection, concern for osteomyelitis/cellulitis and is status post left transmetatarsal amputation with a tendo Achilles lengthening postoperative day 2 -On the left foot.  He has been having several surgeries to the right foot, now on the left. -Continue IV antibiotics with IV Daptomycin and IV Unasyn -Podiatry consulted, he was taken to the OR on 1/19 and is status post left fourth toe amputation at the metatarsophalangeal joint as well as I&D of abscess tendon sheath with wide debridement and multiple fascial planes.  -He underwent repeat surgery 02/11/22 for transmetatarsal amputation -ID also consulted, intraoperative microbiology shows pansensitive E. coli, rare GPC in pairs, continue antibiotics per ID, currently on Unasyn and daptomycin but now ID has stopped this as below -Cx from 1/19 done with moderate gram-positive rods, few S E. coli, moderate Prevotella BV a beta-lactamase positive; E. coli was pansensitive -Blood Cx x2 showed NGTD at 5 days  -Vascular consulted, underwent angiogram 1/22 which showed bilateral single-vessel peroneal outflow with bilateral foot filling through collaterals.   -Vascular feels like he is currently optimized.  -Continue ASA 81 mg po Daily and Atorvastatin 40 mg po Daily -Pain Control as below  -Podiatry managing following recommending Multipodus boot as well as nonweightbearing to left lower extremity as well as keeping dressing intact until follow-up in office -Antibiotic management per ID however the podiatrist feels that he has worse control in the left foot but recommending prophylactic antibiotics as amputation site heals; now ID has discontinued daptomycin and changing to p.o. Augmentin for total of  2 weeks with estimated treatment therapy to be completed on 02/25/2022 -WBC Trend: Recent Labs  Lab 02/06/22 1113 02/07/22 0419 02/08/22 0338 02/09/22 0348 02/11/22 0241 02/12/22 0253  02/13/22 0215  WBC 13.5* 11.6* 11.7* 10.4 8.8 8.8 10.6*  -PT OT recommending home health   Type 2 Diabetes Mellitus -Continue Semglee 30 units BID and Moderate Novolog SSI AC -CBGs ranging from 87-211   Essential Hypertension -Continue Amlodipine 5 mg po daily, Diltiazem 420 mg po Daily, Losartan 100 mg po Daily, and Furosemide 20 mg po Daily. -Will discontinue IVF hydration with NS at 100 mL/hr -C/w Hydralazine 5 mg IV q20 min PRN HBP x 2 Doses and IV Labetalol 10 mg IV q10 minPRN HBP x 4 doses -Continue to Monitor BP per Protocol  -Last BP reading was 125/68   Anxiety -Continue home medications with Buproprion 300 mg po daily and Alprazolam 0.5 mg po BID   Chronic pain, spinal stenosis -Continue home medications with Oxycodone 10 mg po q4hprn Severe Pain, and C/w Gabapentin 800 mg po TID, and Hydromorphone 0.5-1 mg q2hprn IV Severe Breakthrough Pain  -Also getting Acetaminophen 1000 mg po q6h   Thrombocytosis -Likely Reactive -Plt Count Trend: Recent Labs  Lab 02/06/22 1113 02/07/22 0419 02/08/22 0338 02/09/22 0348 02/11/22 0241 02/12/22 0253 02/13/22 0215  PLT 373 357 330 358 403* 453* 383  -Continue to Monitor for S/Sx of bleeding; No overt bleeding noted -Repeat CBC in the AM    Elevated ALT -LFT Trend: Recent Labs  Lab 02/06/22 1113 02/08/22 0338 02/11/22 0241 02/13/22 0215  AST 20 18 40 17  ALT 27 21 54* 34  -Continue to Monitor and Trend and repeat CMP in the AM    Hypoalbuminemia -Albumin Level Trend: Recent Labs  Lab 02/06/22 1113 02/08/22 0338 02/11/22 0241 02/13/22 0215  ALBUMIN 2.7* 2.2* 2.2* 2.2*  -Continue to Monitor and Trend and repeat CMP in the AM    Hypokalemia, improved -K+ Trend: Recent Labs  Lab 02/07/22 0419 02/08/22 0338 02/09/22 0348 02/10/22 0315 02/11/22 0241 02/12/22 0253 02/13/22 0215  K 3.8 3.7 3.5 3.3* 3.6 4.0 3.4*  -Replete with po Kcl 40 mEQ BID x2 -Continue to Monitor and Replete as Necessary -Repeat CMP in  the AM    Anemia of Chronic Disease/Microcytic Anemia -Hgb/Hct Trend: Recent Labs  Lab 02/06/22 1113 02/07/22 0419 02/08/22 0338 02/09/22 0348 02/11/22 0241 02/12/22 0253 02/13/22 0215  HGB 10.4* 10.1* 9.7* 10.2* 9.4* 8.8* 8.1*  HCT 32.6* 31.5* 30.4* 32.2* 29.6* 29.3* 26.1*  MCV 78.6* 78.4* 78.4* 80.3 78.9* 81.2 79.3*  -Check Anemia Panel in the AM -Continue to Monitor for S/Sx of Bleeding; No overt bleeding noted -Repeat CBC int eh AM    Overweight  -Complicates overall prognosis and care -Estimated body mass index is 28 kg/m as calculated from the following:   Height as of this encounter: '5\' 9"'$  (1.753 m).   Weight as of this encounter: 86 kg.  -Weight Loss and Dietary Counseling given  DVT prophylaxis: heparin injection 5,000 Units Start: 02/10/22 2200 SCDs Start: 02/06/22 1745    Code Status: Full Code Family Communication: No family currently at bedside  Disposition Plan:  Level of care: Progressive Cardiac Status is: Inpatient Remains inpatient appropriate because: Needs further clearance by podiatry; infectious diseases have now signed off the case after they have made their recommendations.   Consultants:  Podiatry Infectious Diseases Vascular Surgery   Procedures:  Procedures by Dr. Lanae Crumbly on 02/11/22:  1) transmetatarsal amputation left foot             2) Achilles tendon lengthening left foot   Procedure Performed Dr. Orlie Pollen on 02/10/22: 1.  Ultrasound-guided micropuncture access of the right common femoral artery 2.  Aortogram 3.  Bilateral lower extremity angiogram 4.  Third cannulation, left lower extremity angiogram 5.  Moderate sedation time 50 minutes 6.  Contrast volume 175 mL 7.  Device assisted closure-Mynx   Procedures by Dr. Lanae Crumbly on 02/07/22:             1) amputation of the left fourth toe at metatarsophalangeal joint             2) I&D of abscess tendon sheath with wide debridement in multiple fascial  planes  Antimicrobials:  Anti-infectives (From admission, onward)    Start     Dose/Rate Route Frequency Ordered Stop   02/09/22 1700  ampicillin-sulbactam (UNASYN) 1.5 g in sodium chloride 0.9 % 100 mL IVPB  Status:  Discontinued        1.5 g 200 mL/hr over 30 Minutes Intravenous Every 6 hours 02/09/22 1525 02/09/22 1525   02/09/22 1700  Ampicillin-Sulbactam (UNASYN) 3 g in sodium chloride 0.9 % 100 mL IVPB        3 g 200 mL/hr over 30 Minutes Intravenous Every 6 hours 02/09/22 1525     02/07/22 2200  meropenem (MERREM) 1 g in sodium chloride 0.9 % 100 mL IVPB  Status:  Discontinued        1 g 200 mL/hr over 30 Minutes Intravenous Every 8 hours 02/07/22 1328 02/09/22 1418   02/07/22 1811  vancomycin (VANCOCIN) powder  Status:  Discontinued          As needed 02/07/22 1812 02/07/22 1822   02/07/22 1030  DAPTOmycin (CUBICIN) 700 mg in sodium chloride 0.9 % IVPB        8 mg/kg  85.3 kg 128 mL/hr over 30 Minutes Intravenous Daily 02/07/22 0937     02/07/22 0800  vancomycin (VANCOREADY) IVPB 1250 mg/250 mL  Status:  Discontinued        1,250 mg 166.7 mL/hr over 90 Minutes Intravenous Every 12 hours 02/06/22 1700 02/06/22 1700   02/07/22 0600  cefTRIAXone (ROCEPHIN) 2 g in sodium chloride 0.9 % 100 mL IVPB  Status:  Discontinued       See Hyperspace for full Linked Orders Report.   2 g 200 mL/hr over 30 Minutes Intravenous Every 24 hours 02/06/22 1752 02/07/22 1328   02/07/22 0600  metroNIDAZOLE (FLAGYL) IVPB 500 mg  Status:  Discontinued       See Hyperspace for full Linked Orders Report.   500 mg 100 mL/hr over 60 Minutes Intravenous Every 12 hours 02/06/22 1752 02/07/22 1328   02/06/22 1715  vancomycin (VANCOREADY) IVPB 1500 mg/300 mL  Status:  Discontinued        1,500 mg 150 mL/hr over 120 Minutes Intravenous  Once 02/06/22 1700 02/06/22 1700   02/06/22 1700  cefTRIAXone (ROCEPHIN) 2 g in sodium chloride 0.9 % 100 mL IVPB  Status:  Discontinued       See Hyperspace for full Linked  Orders Report.   2 g 200 mL/hr over 30 Minutes Intravenous Every 24 hours 02/06/22 1657 02/06/22 1750   02/06/22 1700  metroNIDAZOLE (FLAGYL) IVPB 500 mg  Status:  Discontinued       See Hyperspace for full Linked Orders Report.   500 mg 100 mL/hr over 60  Minutes Intravenous Every 12 hours 02/06/22 1657 02/06/22 1750       Subjective: Seen and examined at bedside and states that his foot was still hurting.  States that they are try to get him a shoe to help him walking.  No nausea or vomiting.  Thinks he slept okay.  Denies any lightheadedness or dizziness.  No other concerns or complaints this time.  Objective: Vitals:   02/12/22 2344 02/13/22 0314 02/13/22 0656 02/13/22 0759  BP: 106/68 115/66  138/70  Pulse: 78 86  93  Resp: '15 13 14 12  '$ Temp: 98.8 F (37.1 C) 98.8 F (37.1 C)  99.1 F (37.3 C)  TempSrc: Oral Oral  Oral  SpO2: 99% 97%  97%  Weight:      Height:        Intake/Output Summary (Last 24 hours) at 02/13/2022 0811 Last data filed at 02/13/2022 0547 Gross per 24 hour  Intake 1934.84 ml  Output 1030 ml  Net 904.84 ml   Filed Weights   02/06/22 1103 02/11/22 1607  Weight: 85.3 kg 86 kg   Examination: Physical Exam:  Constitutional: WN/WD overweight male in no acute distress but appears a little uncomfortable Respiratory: Diminished to auscultation bilaterally, no wheezing, rales, rhonchi or crackles. Normal respiratory effort and patient is not tachypenic. No accessory muscle use.  Unlabored breathing Cardiovascular: RRR, no murmurs / rubs / gallops. S1 and S2 auscultated.  Mild lower extremity edema on the left compared to the right Abdomen: Soft, non-tender, distended secondary to body habitus. Bowel sounds positive.  GU: Deferred. Musculoskeletal: No clubbing / cyanosis of digits/nails. No joint deformity upper and lower extremities. Skin: No rashes, lesions, ulcers on limited skin evaluation. No induration; Warm and dry.  Neurologic: CN 2-12 grossly  intact with no focal deficits. Romberg sign and cerebellar reflexes not assessed.  Psychiatric: Normal judgment and insight. Alert and oriented x 3. Normal mood and appropriate affect.   Data Reviewed: I have personally reviewed following labs and imaging studies  CBC: Recent Labs  Lab 02/06/22 1113 02/07/22 0419 02/08/22 0338 02/09/22 0348 02/11/22 0241 02/12/22 0253 02/13/22 0215  WBC 13.5*   < > 11.7* 10.4 8.8 8.8 10.6*  NEUTROABS 10.7*  --   --   --   --   --  7.3  HGB 10.4*   < > 9.7* 10.2* 9.4* 8.8* 8.1*  HCT 32.6*   < > 30.4* 32.2* 29.6* 29.3* 26.1*  MCV 78.6*   < > 78.4* 80.3 78.9* 81.2 79.3*  PLT 373   < > 330 358 403* 453* 383   < > = values in this interval not displayed.   Basic Metabolic Panel: Recent Labs  Lab 02/06/22 1929 02/07/22 0419 02/08/22 0338 02/09/22 0348 02/10/22 0315 02/11/22 0241 02/12/22 0253 02/13/22 0215  NA  --    < > 137 138 141 139 140 137  K  --    < > 3.7 3.5 3.3* 3.6 4.0 3.4*  CL  --    < > 104 103 107 107 104 104  CO2  --    < > '22 25 24 23 26 25  '$ GLUCOSE  --    < > 188* 209* 90 171* 317* 301*  BUN  --    < > '13 15 14 11 14 16  '$ CREATININE  --    < > 0.84 0.90 0.78 0.73 0.89 0.95  CALCIUM  --    < > 8.4* 8.5* 8.4* 8.3* 8.5* 8.2*  MG 1.8  --  1.7 2.0  --  2.0  --  2.0  PHOS  --   --   --   --   --   --   --  3.6   < > = values in this interval not displayed.   GFR: Estimated Creatinine Clearance: 89.8 mL/min (by C-G formula based on SCr of 0.95 mg/dL). Liver Function Tests: Recent Labs  Lab 02/06/22 1113 02/08/22 0338 02/11/22 0241 02/13/22 0215  AST 20 18 40 17  ALT 27 21 54* 34  ALKPHOS 120 91 102 90  BILITOT 0.4 <0.1* 0.1* <0.1*  PROT 6.6 5.4* 5.5* 5.4*  ALBUMIN 2.7* 2.2* 2.2* 2.2*   No results for input(s): "LIPASE", "AMYLASE" in the last 168 hours. No results for input(s): "AMMONIA" in the last 168 hours. Coagulation Profile: No results for input(s): "INR", "PROTIME" in the last 168 hours. Cardiac  Enzymes: Recent Labs  Lab 02/07/22 0958  CKTOTAL 49   BNP (last 3 results) No results for input(s): "PROBNP" in the last 8760 hours. HbA1C: No results for input(s): "HGBA1C" in the last 72 hours. CBG: Recent Labs  Lab 02/12/22 1306 02/12/22 1714 02/12/22 2107 02/13/22 0607 02/13/22 0729  GLUCAP 249* 201* 148* 198* 167*   Lipid Profile: Recent Labs    02/11/22 0241  CHOL 84  HDL 24*  LDLCALC 40  TRIG 102  CHOLHDL 3.5   Thyroid Function Tests: No results for input(s): "TSH", "T4TOTAL", "FREET4", "T3FREE", "THYROIDAB" in the last 72 hours. Anemia Panel: No results for input(s): "VITAMINB12", "FOLATE", "FERRITIN", "TIBC", "IRON", "RETICCTPCT" in the last 72 hours. Sepsis Labs: Recent Labs  Lab 02/06/22 1113 02/06/22 1929  LATICACIDVEN 0.7 0.7    Recent Results (from the past 240 hour(s))  WOUND CULTURE     Status: Abnormal   Collection Time: 02/06/22 10:14 AM   Specimen: Abscess; Wound  Result Value Ref Range Status   MICRO NUMBER: [id number]  Final   SPECIMEN QUALITY: Adequate  Final   SOURCE: LEFT TOE  Final   STATUS: FINAL  Final   GRAM STAIN:   Final    No white blood cells seen No epithelial cells seen Many Gram negative bacilli Few Gram positive cocci in pairs   ISOLATE 1: Escherichia coli (A)  Final    Comment: Heavy growth of Escherichia coli      Susceptibility   Escherichia coli - AEROBIC CULT, GRAM STAIN NEGATIVE 1    AMOX/CLAVULANIC <=2 Sensitive     AMPICILLIN 8 Sensitive     AMPICILLIN/SULBACTAM <=2 Sensitive     CEFAZOLIN* <=4 Not Reportable      * For infections other than uncomplicated UTI caused by E. coli, K. pneumoniae or P. mirabilis: Cefazolin is resistant if MIC > or = 8 mcg/mL. (Distinguishing susceptible versus intermediate for isolates with MIC < or = 4 mcg/mL requires additional testing.)     CEFTAZIDIME <=1 Sensitive     CEFEPIME <=1 Sensitive     CEFTRIAXONE <=1 Sensitive     CIPROFLOXACIN <=0.25 Sensitive     LEVOFLOXACIN  <=0.12 Sensitive     GENTAMICIN <=1 Sensitive     IMIPENEM <=0.25 Sensitive     PIP/TAZO <=4 Sensitive     TOBRAMYCIN <=1 Sensitive     TRIMETH/SULFA* <=20 Sensitive      * For infections other than uncomplicated UTI caused by E. coli, K. pneumoniae or P. mirabilis: Cefazolin is resistant if MIC > or = 8 mcg/mL. (Distinguishing susceptible versus intermediate  for isolates with MIC < or = 4 mcg/mL requires additional testing.) Legend: S = Susceptible  I = Intermediate R = Resistant  NS = Not susceptible * = Not tested  NR = Not reported **NN = See antimicrobic comments   Blood culture (routine x 2)     Status: None   Collection Time: 02/06/22 11:25 AM   Specimen: BLOOD  Result Value Ref Range Status   Specimen Description BLOOD BLOOD RIGHT FOREARM  Final   Special Requests   Final    BOTTLES DRAWN AEROBIC AND ANAEROBIC Blood Culture adequate volume   Culture   Final    NO GROWTH 5 DAYS Performed at [facility], [place] [address], [place], [place] [address]    Report Status 02/11/2022 FINAL  Final  Blood culture (routine x 2)     Status: None   Collection Time: 02/06/22 11:27 AM   Specimen: BLOOD  Result Value Ref Range Status   Specimen Description BLOOD BLOOD LEFT FOREARM  Final   Special Requests   Final    BOTTLES DRAWN AEROBIC AND ANAEROBIC Blood Culture adequate volume   Culture   Final    NO GROWTH 5 DAYS Performed at [facility], [place] [address] [place], [place] [address]    Report Status 02/11/2022 FINAL  Final  Surgical pcr screen     Status: None   Collection Time: 02/07/22  3:07 PM  Result Value Ref Range Status   MRSA, PCR NEGATIVE NEGATIVE Final   Staphylococcus aureus NEGATIVE NEGATIVE Final    Comment: (NOTE) The Xpert SA Assay (FDA approved for NASAL specimens in patients 9 years of age and older), is one component of a comprehensive surveillance program. It is not intended to diagnose infection nor to guide or monitor  treatment. Performed at [facility], [place] [address], [place], [place] [address]   Aerobic/Anaerobic Culture w Gram Stain (surgical/deep wound)     Status: None (Preliminary result)   Collection Time: 02/07/22  6:06 PM   Specimen: Soft Tissue, Other  Result Value Ref Range Status   Specimen Description TISSUE LEFT FOOT  Final   Special Requests SWAB OF DEEP TISSUE PT ON DACTOMYCIN  Final   Gram Stain   Final    RARE WBC PRESENT, PREDOMINANTLY PMN MODERATE GRAM POSITIVE RODS RARE GRAM POSITIVE COCCI IN PAIRS    Culture   Final    FEW ESCHERICHIA COLI MODERATE PREVOTELLA BIVIA BETA LACTAMASE POSITIVE CULTURE REINCUBATED FOR BETTER GROWTH Performed at [facility], [place] [address], [place], [place] [address]    Report Status PENDING  Incomplete   Organism ID, Bacteria ESCHERICHIA COLI  Final      Susceptibility   Escherichia coli - MIC*    AMPICILLIN 4 SENSITIVE Sensitive     CEFAZOLIN <=4 SENSITIVE Sensitive     CEFEPIME <=0.12 SENSITIVE Sensitive     CEFTAZIDIME <=1 SENSITIVE Sensitive     CEFTRIAXONE <=0.25 SENSITIVE Sensitive     CIPROFLOXACIN <=0.25 SENSITIVE Sensitive     GENTAMICIN <=1 SENSITIVE Sensitive     IMIPENEM <=0.25 SENSITIVE Sensitive     TRIMETH/SULFA <=20 SENSITIVE Sensitive     AMPICILLIN/SULBACTAM <=2 SENSITIVE Sensitive     PIP/TAZO <=4 SENSITIVE Sensitive     * FEW ESCHERICHIA COLI    Radiology Studies: No results found.  Scheduled Meds:  acetaminophen  1,000 mg Oral Q6H   amLODipine  5 mg Oral Daily   aspirin EC  81 mg Oral Q breakfast   atorvastatin  40 mg Oral Daily   buPROPion  300 mg Oral Daily   diltiazem  420 mg Oral Daily   fentaNYL (SUBLIMAZE) injection  50 mcg Intravenous Once   furosemide  20 mg Oral Daily   gabapentin  800 mg Oral TID   heparin  5,000 Units Subcutaneous Q8H   insulin aspart  0-15 Units Subcutaneous TID WC   insulin glargine-yfgn  30 Units Subcutaneous BID   losartan  100 mg Oral Daily   pantoprazole   40 mg Oral Daily   sodium chloride flush  3 mL Intravenous Q12H   sodium chloride flush  3 mL Intravenous Q12H   Continuous Infusions:  sodium chloride     ampicillin-sulbactam (UNASYN) IV 3 g (02/13/22 0517)   DAPTOmycin (CUBICIN) 700 mg in sodium chloride 0.9 % IVPB 700 mg (02/12/22 1954)    LOS: 7 days   Raiford Noble, DO [facility] Available via Epic secure chat 7am-7pm After these hours, please refer to coverage provider listed on [email or web address] 02/13/2022, 8:11 AM

## 2022-02-13 NOTE — Progress Notes (Signed)
Mobility Specialist Progress Note:   02/13/22 1300  Mobility  Activity Ambulated with assistance in room  Level of Assistance Moderate assist, patient does 50-74%  Assistive Device Front wheel walker  Distance Ambulated (ft) 30 ft  LLE Weight Bearing NWB  Activity Response Tolerated fair  $Mobility charge 1 Mobility   Pt in bed willing to participate in mobility. Complaints of LLE pain. Pt required 4 seated breaks d/t fatigue. ModA to stand. Contact guard to hop. Left in ed with call bell in reach and all needs met.   Gareth Eagle Antwaun Buth Mobility Specialist Please contact via [employer] or  Rehab Office at [phone]

## 2022-02-13 NOTE — Progress Notes (Addendum)
[facility] Infectious Diseases Follow Up Note  Patient Identification: Patient Name: [name] MRN: [id number] [place] Date: 02/06/2022 10:56 AM Age: 62 y.o.Today's Date: 02/13/2022  Reason for Visit: DFU/osteomyelitis   Principal Problem:   Diabetic foot infection [facility]) Active Problems:   Controlled IDDM-2 with hyperglycemia   Chronic pain   HTN (hypertension)   Anxiety   Abnormal surgical wound   Spinal stenosis of lumbar region   Microcytic anemia   Hypokalemia   Equinus contracture of left ankle   Antibiotics:  Daptomycin 1/19-c Meropoenem 1/19-1/20, Unasyn 1/21-c Ceftriaxone 1/18 Metronidazole 1/18   Lines/Hardware: No known  Interval Events: continues to be afebrile, wbc 10.6  Assessment 65 y o male with PMH as below including 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 " Unfortunately the abscess continued to extend further proximally with a tension and necrosis of the dorsal and plantar soft tissue surrounding the metatarsal. The metatarsal itself appeared to be healthy" OR cx with Pan S E coli, gram stain with GPR and GPC in pairs. 1/18 left toe wound cx with E coli, GPC in pairs and GPR in gram stain   1/22 S/p angiogram : brisk, single-vessel  peroneal outflow to the feet bilaterally. There is diffuse collateralization in the left foot   1/23 s/p Left TMA and achilles tendon lengthening and thought to be source control per Podiatry   Recommendations Will DC  daptomycin, 1/18 cx GPR identified as Prevotella bivia.  Unable to identify GPC in pairs per micro, likely Enterococcus  Given source control with TMA, do not need IV abtx as well as prolonged duration of abtx. Plan to switch to PO augmentin upon discharge to complete 2 weeks course. EOT 02/25/22  Post op care per Podiatry   D/w ID Pharm D  Fu OR cultures to completion. Fu in [place] clinic arranged. ID will sign off.   Rest of the management as per the primary team. Thank you for the consult. Please page with pertinent questions or concerns.  ______________________________________________________________________ Subjective patient seen and examined at the bedside. No complaints.  Denies nausea, vomitin, rashes and diarrhea  Vitals BP 115/66 (BP Location: Left Arm)   Pulse 86   Temp 98.8 F (37.1 C) (Oral)   Resp 14   Ht '5\' 9"'$  (1.753 m)   Wt 86 kg   SpO2 97%   BMI 28.00 kg/m     Physical Exam Constitutional: sitting up in the bed and appears comfortable     Comments:   Cardiovascular:     Rate and Rhythm: Normal rate and regular rhythm.     Heart sounds:   Pulmonary:     Effort: Pulmonary effort is normal on room air     Comments:   Abdominal:     Palpations: Abdomen is soft.     Tenderness: non distended and non tender  Musculoskeletal:        General: No swelling or tenderness in peripheral joints, left foot is bandaged C/D/I  Skin:    Comments: no rashes  Neurological:     General: No focal deficit present. Awake, alert and oriented   Psychiatric:        Mood and Affect: Mood normal.   Pertinent Microbiology Results for orders placed or performed during the hospital encounter of 02/06/22  Blood culture (routine x 2)     Status: None   Collection Time: 02/06/22 11:25 AM   Specimen: BLOOD  Result Value Ref Range Status   Specimen Description BLOOD BLOOD RIGHT FOREARM  Final   Special Requests   Final    BOTTLES DRAWN AEROBIC AND ANAEROBIC Blood Culture  adequate volume   Culture   Final    NO GROWTH 5 DAYS Performed at [facility], [place] [address], [place], [place] [address]    Report Status 02/11/2022 FINAL  Final  Blood culture (routine x 2)     Status: None   Collection Time: 02/06/22 11:27 AM   Specimen: BLOOD  Result Value Ref Range Status   Specimen Description BLOOD BLOOD LEFT FOREARM  Final   Special Requests   Final    BOTTLES DRAWN AEROBIC AND ANAEROBIC Blood Culture adequate volume   Culture   Final    NO GROWTH 5 DAYS Performed at [facility], [place] [address] [place], [place] [address]    Report Status 02/11/2022 FINAL  Final  Surgical pcr screen     Status: None   Collection Time: 02/07/22  3:07 PM  Result Value Ref Range Status   MRSA, PCR NEGATIVE NEGATIVE Final   Staphylococcus aureus NEGATIVE NEGATIVE Final    Comment: (NOTE) The Xpert SA Assay (FDA approved for NASAL specimens in patients 18 years of age and older), is one component of a comprehensive surveillance program. It is not intended to diagnose infection nor to guide or monitor treatment. Performed at [facility], [place] [address], [place], [place] [address]   Aerobic/Anaerobic Culture w Gram Stain (surgical/deep wound)     Status: None (Preliminary result)   Collection Time: 02/07/22  6:06 PM   Specimen: Soft Tissue, Other  Result Value Ref Range Status   Specimen Description TISSUE LEFT FOOT  Final   Special Requests SWAB OF DEEP TISSUE PT ON DACTOMYCIN  Final   Gram Stain   Final    RARE WBC PRESENT, PREDOMINANTLY PMN MODERATE GRAM POSITIVE RODS RARE GRAM POSITIVE COCCI IN PAIRS    Culture   Final    FEW ESCHERICHIA COLI MODERATE PREVOTELLA BIVIA BETA LACTAMASE POSITIVE CULTURE REINCUBATED FOR BETTER GROWTH Performed at [facility], [place] [address] [place], [place] [address]    Report Status PENDING  Incomplete   Organism ID, Bacteria ESCHERICHIA COLI  Final      Susceptibility   Escherichia coli - MIC*     AMPICILLIN 4 SENSITIVE Sensitive     CEFAZOLIN <=4 SENSITIVE Sensitive     CEFEPIME <=0.12 SENSITIVE Sensitive     CEFTAZIDIME <=1 SENSITIVE Sensitive     CEFTRIAXONE <=0.25 SENSITIVE Sensitive     CIPROFLOXACIN <=0.25 SENSITIVE Sensitive     GENTAMICIN <=1 SENSITIVE Sensitive     IMIPENEM <=0.25 SENSITIVE Sensitive     TRIMETH/SULFA <=20 SENSITIVE Sensitive     AMPICILLIN/SULBACTAM <=2 SENSITIVE Sensitive     PIP/TAZO <=4 SENSITIVE Sensitive     * FEW ESCHERICHIA COLI    Pertinent Lab.    Latest Ref Rng & Units 02/13/2022    2:15 AM 02/12/2022    2:53 AM 02/11/2022  2:41 AM  CBC  WBC 4.0 - 10.5 K/uL 10.6  8.8  8.8   Hemoglobin 13.0 - 17.0 g/dL 8.1  8.8  9.4   Hematocrit 39.0 - 52.0 % 26.1  29.3  29.6   Platelets 150 - 400 K/uL 383  453  403       Latest Ref Rng & Units 02/13/2022    2:15 AM 02/12/2022    2:53 AM 02/11/2022    2:41 AM  CMP  Glucose 70 - 99 mg/dL 301  317  171   BUN 6 - 20 mg/dL '16  14  11   '$ Creatinine 0.61 - 1.24 mg/dL 0.95  0.89  0.73   Sodium 135 - 145 mmol/L 137  140  139   Potassium 3.5 - 5.1 mmol/L 3.4  4.0  3.6   Chloride 98 - 111 mmol/L 104  104  107   CO2 22 - 32 mmol/L '25  26  23   '$ Calcium 8.9 - 10.3 mg/dL 8.2  8.5  8.3   Total Protein 6.5 - 8.1 g/dL 5.4   5.5   Total Bilirubin 0.3 - 1.2 mg/dL <0.1   0.1   Alkaline Phos 38 - 126 U/L 90   102   AST 15 - 41 U/L 17   40   ALT 0 - 44 U/L 34   54     Pertinent Imaging today Plain films and CT images have been personally visualized and interpreted; radiology reports have been reviewed. Decision making incorporated into the Impression / Recommendations.  No results found.  I spent 51 minutes for this patient encounter including review of prior medical records, coordination of care with primary/other specialist with greater than 50% of time being face to face/counseling and discussing diagnostics/treatment plan with the patient/family.  Electronically signed by:   Rosiland Oz, MD Infectious  Disease Physician [facility] for Infectious Disease Pager: [phone]

## 2022-02-13 NOTE — Telephone Encounter (Signed)
Patient is aware 

## 2022-02-14 ENCOUNTER — Telehealth: Payer: Self-pay | Admitting: *Deleted

## 2022-02-14 ENCOUNTER — Other Ambulatory Visit (HOSPITAL_COMMUNITY): Payer: Self-pay

## 2022-02-14 DIAGNOSIS — E1165 Type 2 diabetes mellitus with hyperglycemia: Secondary | ICD-10-CM | POA: Diagnosis not present

## 2022-02-14 DIAGNOSIS — T819XXD Unspecified complication of procedure, subsequent encounter: Secondary | ICD-10-CM | POA: Diagnosis not present

## 2022-02-14 DIAGNOSIS — F419 Anxiety disorder, unspecified: Secondary | ICD-10-CM | POA: Diagnosis not present

## 2022-02-14 DIAGNOSIS — E11628 Type 2 diabetes mellitus with other skin complications: Secondary | ICD-10-CM | POA: Diagnosis not present

## 2022-02-14 LAB — COMPREHENSIVE METABOLIC PANEL
ALT: 32 U/L (ref 0–44)
AST: 26 U/L (ref 15–41)
Albumin: 2.2 g/dL — ABNORMAL LOW (ref 3.5–5.0)
Alkaline Phosphatase: 97 U/L (ref 38–126)
Anion gap: 8 (ref 5–15)
BUN: 14 mg/dL (ref 6–20)
CO2: 25 mmol/L (ref 22–32)
Calcium: 8.3 mg/dL — ABNORMAL LOW (ref 8.9–10.3)
Chloride: 105 mmol/L (ref 98–111)
Creatinine, Ser: 0.79 mg/dL (ref 0.61–1.24)
GFR, Estimated: >60 mL/min (ref >60)
Glucose, Bld: 298 mg/dL — ABNORMAL HIGH (ref 70–99)
Potassium: 3.8 mmol/L (ref 3.5–5.1)
Sodium: 138 mmol/L (ref 135–145)
Total Bilirubin: <0.1 mg/dL — ABNORMAL LOW (ref 0.3–1.2)
Total Protein: 5.8 g/dL — ABNORMAL LOW (ref 6.5–8.1)

## 2022-02-14 LAB — CBC WITH DIFFERENTIAL/PLATELET
Abs Immature Granulocytes: 0.19 10*3/uL — ABNORMAL HIGH (ref 0.00–0.07)
Basophils Absolute: 0.1 10*3/uL (ref 0.0–0.1)
Basophils Relative: 1 %
Eosinophils Absolute: 0.3 10*3/uL (ref 0.0–0.5)
Eosinophils Relative: 3 %
HCT: 27.2 % — ABNORMAL LOW (ref 39.0–52.0)
Hemoglobin: 8.3 g/dL — ABNORMAL LOW (ref 13.0–17.0)
Immature Granulocytes: 2 %
Lymphocytes Relative: 19 %
Lymphs Abs: 2 10*3/uL (ref 0.7–4.0)
MCH: 24.6 pg — ABNORMAL LOW (ref 26.0–34.0)
MCHC: 30.5 g/dL (ref 30.0–36.0)
MCV: 80.7 fL (ref 80.0–100.0)
Monocytes Absolute: 0.7 10*3/uL (ref 0.1–1.0)
Monocytes Relative: 7 %
Neutro Abs: 7.2 10*3/uL (ref 1.7–7.7)
Neutrophils Relative %: 68 %
Platelets: 435 10*3/uL — ABNORMAL HIGH (ref 150–400)
RBC: 3.37 MIL/uL — ABNORMAL LOW (ref 4.22–5.81)
RDW: 16.2 % — ABNORMAL HIGH (ref 11.5–15.5)
WBC: 10.5 10*3/uL (ref 4.0–10.5)
nRBC: 0 % (ref 0.0–0.2)

## 2022-02-14 LAB — RETICULOCYTES
Immature Retic Fract: 35.6 % — ABNORMAL HIGH (ref 2.3–15.9)
RBC.: 3.27 MIL/uL — ABNORMAL LOW (ref 4.22–5.81)
Retic Count, Absolute: 62.8 10*3/uL (ref 19.0–186.0)
Retic Ct Pct: 1.9 % (ref 0.4–3.1)

## 2022-02-14 LAB — FOLATE: Folate: 11.2 ng/mL (ref >5.9)

## 2022-02-14 LAB — MAGNESIUM: Magnesium: 2 mg/dL (ref 1.7–2.4)

## 2022-02-14 LAB — FERRITIN: Ferritin: 56 ng/mL (ref 24–336)

## 2022-02-14 LAB — IRON AND TIBC
Iron: 15 ug/dL — ABNORMAL LOW (ref 45–182)
Saturation Ratios: 6 % — ABNORMAL LOW (ref 17.9–39.5)
TIBC: 265 ug/dL (ref 250–450)
UIBC: 250 ug/dL

## 2022-02-14 LAB — GLUCOSE, CAPILLARY
Glucose-Capillary: 237 mg/dL — ABNORMAL HIGH (ref 70–99)
Glucose-Capillary: 110 mg/dL — ABNORMAL HIGH (ref 70–99)

## 2022-02-14 LAB — VITAMIN B12: Vitamin B-12: 292 pg/mL (ref 180–914)

## 2022-02-14 LAB — PHOSPHORUS: Phosphorus: 2.7 mg/dL (ref 2.5–4.6)

## 2022-02-14 MED ORDER — POLYETHYLENE GLYCOL 3350 17 GM/SCOOP PO POWD
17.0000 g | Freq: Every day | ORAL | 0 refills | Status: DC | PRN
  Filled 2022-02-14: qty 238, 14d supply, fill #0

## 2022-02-14 MED ORDER — ACETAMINOPHEN 500 MG PO TABS
1000.0000 mg | ORAL_TABLET | Freq: Four times a day (QID) | ORAL | 0 refills | Status: DC | PRN
  Filled 2022-02-14: qty 40, 5d supply, fill #0

## 2022-02-14 NOTE — Progress Notes (Signed)
Brace delivered.  OT assisted to teach Pt how to wear.  Pt aware [name] wants him to wear it until his follow up appt on Tue 1/30.  All other discharge instructions given and reviewed, TOC meds delivered.  All questions answered.  Pt has called dtr for ride

## 2022-02-14 NOTE — Telephone Encounter (Signed)
Patient called this morning he states the hospital does not know what boot you are talking about and he can't be released until they discuss what boot you want him to go home in Please advise

## 2022-02-14 NOTE — Telephone Encounter (Signed)
Orthopedic tech Veva Holes is aware and working on this

## 2022-02-14 NOTE — Progress Notes (Signed)
Subjective:  Patient ID: [name], male    DOB: 1960/09/28,  MRN: [id number]  Patient seen at [facility] POD# 2 left TMA and TAL  Doing well, pain is improving  Negative for chest pain and shortness of breath Fever: no Night sweats: no Constitutional signs: no Review of all other systems is negative Objective:   Vitals:   02/14/22 0500 02/14/22 0600  BP:  130/88  Pulse:  95  Resp: 20 20  Temp:  98.2 F (36.8 C)  SpO2:  96%   General AA&O x3. Normal mood and affect.  Vascular Weakly palpable pedal pulses, capillary fill intact, some cool temp to 5th toe  Neurologic Epicritic sensation grossly reduced  Dermatologic Incisional VAC is operating, no drainage in canister  Orthopedic: MMT 5/5 in dorsiflexion, plantarflexion, inversion, and eversion. Normal joint ROM without pain or crepitus.    Results for orders placed or performed during the hospital encounter of 02/06/22  Blood culture (routine x 2)     Status: None   Collection Time: 02/06/22 11:25 AM   Specimen: BLOOD  Result Value Ref Range Status   Specimen Description BLOOD BLOOD RIGHT FOREARM  Final   Special Requests   Final    BOTTLES DRAWN AEROBIC AND ANAEROBIC Blood Culture adequate volume   Culture   Final    NO GROWTH 5 DAYS Performed at [facility], [place] [address], [place], [place] [address]    Report Status 02/11/2022 FINAL  Final  Blood culture (routine x 2)     Status: None   Collection Time: 02/06/22 11:27 AM   Specimen: BLOOD  Result Value Ref Range Status   Specimen Description BLOOD BLOOD LEFT FOREARM  Final   Special Requests   Final    BOTTLES DRAWN AEROBIC AND ANAEROBIC Blood Culture adequate volume   Culture   Final    NO GROWTH 5 DAYS Performed at [facility], [place] [address], [place], [place] [address]    Report Status 02/11/2022 FINAL  Final  Surgical pcr screen     Status: None   Collection Time: 02/07/22  3:07 PM  Result Value Ref Range Status   MRSA, PCR NEGATIVE  NEGATIVE Final   Staphylococcus aureus NEGATIVE NEGATIVE Final    Comment: (NOTE) The Xpert SA Assay (FDA approved for NASAL specimens in patients 27 years of age and older), is one component of a comprehensive surveillance program. It is not intended to diagnose infection nor to guide or monitor treatment. Performed at [facility], [place] [address] [place], [place] [address]   Aerobic/Anaerobic Culture w Gram Stain (surgical/deep wound)     Status: None (Preliminary result)   Collection Time: 02/07/22  6:06 PM   Specimen: Soft Tissue, Other  Result Value Ref Range Status   Specimen Description TISSUE LEFT FOOT  Final   Special Requests SWAB OF DEEP TISSUE PT ON DACTOMYCIN  Final   Gram Stain   Final    RARE WBC PRESENT, PREDOMINANTLY PMN MODERATE GRAM POSITIVE RODS RARE GRAM POSITIVE COCCI IN PAIRS    Culture   Final    FEW ESCHERICHIA COLI MODERATE PREVOTELLA BIVIA BETA LACTAMASE POSITIVE Performed at [facility], [place] [address] [place], [place] [address]    Report Status PENDING  Incomplete   Organism ID, Bacteria ESCHERICHIA COLI  Final      Susceptibility   Escherichia coli - MIC*    AMPICILLIN 4 SENSITIVE Sensitive     CEFAZOLIN <=  4 SENSITIVE Sensitive     CEFEPIME <=0.12 SENSITIVE Sensitive     CEFTAZIDIME <=1 SENSITIVE Sensitive     CEFTRIAXONE <=0.25 SENSITIVE Sensitive     CIPROFLOXACIN <=0.25 SENSITIVE Sensitive     GENTAMICIN <=1 SENSITIVE Sensitive     IMIPENEM <=0.25 SENSITIVE Sensitive     TRIMETH/SULFA <=20 SENSITIVE Sensitive     AMPICILLIN/SULBACTAM <=2 SENSITIVE Sensitive     PIP/TAZO <=4 SENSITIVE Sensitive     * FEW ESCHERICHIA COLI   CBC    Component Value Date/Time   WBC 10.5 02/14/2022 0152   RBC 3.27 (L) 02/14/2022 0152   RBC 3.37 (L) 02/14/2022 0152   HGB 8.3 (L) 02/14/2022 0152   HCT 27.2 (L) 02/14/2022 0152   PLT 435 (H) 02/14/2022 0152   MCV 80.7 02/14/2022 0152   MCH 24.6 (L) 02/14/2022 0152   MCHC 30.5 02/14/2022 0152    RDW 16.2 (H) 02/14/2022 0152   LYMPHSABS 2.0 02/14/2022 0152   MONOABS 0.7 02/14/2022 0152   EOSABS 0.3 02/14/2022 0152   BASOSABS 0.1 02/14/2022 0152    Assessment & Plan:  Patient was evaluated and treated and all questions answered.   -Overall doing well pain is improving.  Continue nonweightbearing left lower extremity.  Agree with infectious disease do not think extended course of antibiotics will be necessary p.o. treatment should be adequate.  I have messaged pathology to assess the resection margin as they did not specify the presence of the osteomyelitis here.  Stable for discharge from my standpoint once he has his Multi-Podus splint.  Wound VAC will stay on until outpatient follow-up next week  Criselda Peaches, DPM  Accessible via secure chat for questions or concerns.

## 2022-02-14 NOTE — Progress Notes (Signed)
PT Cancellation Note  Patient Details Name: [name] MRN: [id number] DOB: 08/14/1960   Cancelled Treatment:    Reason Eval/Treat Not Completed: Other (comment). Pt ready to leave. Reports he has everything he needs at home and ready to go home.   Shary Decamp [facility] 02/14/2022, 4:26 PM [place] Office [phone]

## 2022-02-14 NOTE — Progress Notes (Signed)
Vendor for brace ordered has been called approximately 2 hours ago per Ortho tech on at [employer] today, however, patient does not have brace yet delivered to room. Will hold discharge until brace is delivered. Bedside RN updated. Albin Felling Kenzlei Runions RN

## 2022-02-14 NOTE — Telephone Encounter (Signed)
Patient is calling to ask for clarification on the type of boot that is needed per the hospital,wants to be able to go home today, please advise.

## 2022-02-14 NOTE — TOC Transition Note (Addendum)
Transition of Care (TOC) - CM/SW Discharge Note Marvetta Gibbons RN, BSN Transitions of Care Unit 4E- RN Case Manager See Treatment Team for direct phone #   Patient Details  Name: [name] MRN: [id number] Date of Birth: [birth year]  Transition of Care [facility]) CM/SW Contact:  Dawayne Patricia, RN Phone Number: 02/14/2022, 4:15 PM   Clinical Narrative:    Pt stable for transition home today pending ortho device, bedside RN and unit staff working with podiatry for needed device- Device still pending delivery.   HHRN/PTOT orders placed. CM spoke with pt at bedside- pt agreeable to [facility] services - reports that he had services last year as well when his Right foot was done. Per review in [employer] Patient Ping system pt used [place] last year. List provided Per CMS guidelines from [employer] website with star ratings (copy placed in shadow chart) per pt choice he would like to see if [facility] can service again and if not has no further preference.  Per pt he has RW, walking stick and BSC at home. He is asking about a shower chair however voiced that he needs a small one- and one that does not have a back or arms - discussed with pt having his family assist him in looking for one that might fit his older tub either at local stores or online such as on [employer]. Pt to follow up.   Daughter called while CM was in the room to check on discharge- explained barrier and waiting on ortho device. Daughter or sister to provide transport home.     Call made to [facility]- however they are unable to accept in [place] at this time for nursing.  Calls also made to additional [facility] agencies: [facility]- no availability till late next week [facility] - no staffing in [facility]- no staffing in [employer]- no Medicaid contract at this time [facility]- able to accept with a delay for start of care with nursing- anticipate [facility] for Mon/Tues next week. - referral accepted as this is  best available HH option.  Msg sent to bedside RN that pt will need education on wound care   Per Bedside RN- [name] said no dsg changes till his follow up on tue 1/30 - [facility]- [facility] updated   Patient Goals and CMS Choice CMS Medicare.gov Compare Post Acute Care list provided to:: Patient Choice offered to / list presented to : Patient  Discharge Placement                 Home w/ [facility]        Discharge Plan and Services Additional resources added to the After Visit Summary for     Discharge Planning Services: CM Consult Post Acute Care Choice: Home Health, Durable Medical Equipment          DME Arranged: N/A DME Agency: NA       HH Arranged: RN, PT, OT   Date HH Agency Contacted: 02/14/22 Time HH Agency Contacted: [facility] [facility] contacted spoke with [name] at 1615    Social Determinants of Health (SDOH) Interventions SDOH Screenings   Tobacco Use: Medium Risk (02/12/2022)     Readmission Risk Interventions    02/14/2022    4:14 PM  Readmission Risk Prevention Plan  Transportation Screening Complete  PCP or Specialist Appt within 3-5 Days Complete  HRI or [place] Complete  Social Work Consult for [place] Planning/Counseling Complete  Palliative Care Screening Not Applicable  Medication Review (RN Care  Manager) Complete

## 2022-02-14 NOTE — Progress Notes (Signed)
Pharmacy Antibiotic Note  [name] a 62 y.o. male admitted with a diabetic foot infection. He has had 7 different procedures (total) of both his R and L foot. He presented with a possible surgical site infection following a L partal fourth toe amputation. Pharmacy has been consulted for daptomycin and meropenem dosing.  S/p left 4th toe amputation on 1/19. Operative culture grew E coli, pansensitive.    ID following and plans to continue Unasyn until discharge then po augmentin (end date 02/25/22) -renal function stable   Plan: Unasyn 3gm IV q6h Will sign off. Please contact pharmacy with any other needs.    Height: '5\' 9"'$  (175.3 cm) Weight: 86 kg (189 lb 9.5 oz) IBW/kg (Calculated) : 70.7  Temp (24hrs), Avg:98.4 F (36.9 C), Min:98.2 F (36.8 C), Max:99 F (37.2 C)  Recent Labs  Lab 02/09/22 0348 02/10/22 0315 02/11/22 0241 02/12/22 0253 02/13/22 0215 02/14/22 0152  WBC 10.4  --  8.8 8.8 10.6* 10.5  CREATININE 0.90 0.78 0.73 0.89 0.95 0.79     Estimated Creatinine Clearance: 106.7 mL/min (by C-G formula based on SCr of 0.79 mg/dL).    Allergies  Allergen Reactions   Latex Rash and Other (See Comments)    Redness     Antimicrobials this admission: Ceftriaxone 1/18>>1/19 flagyl 1/18>>1/19 Daptomycin 1/19>> Meropenem 1/19>>1/21 Unasyn 1/21 >>    Dose adjustments this admission: n/a  Microbiology results: 05/24/21 R foot bone cx: ESBL E coli, E faecalis, Bacteroides caccae (beta-lactamase positive)   1/18 Blood: ng  1/18 surgcial cx (tissue L foot): few E coli - pansensitive, no anaerobes isolated 1/19 MRSA PCR: neg

## 2022-02-14 NOTE — Discharge Summary (Signed)
Physician Discharge Summary   Patient: [name] MRN: [id number] DOB: 1960-09-24  Admit date:     02/06/2022  Discharge date: 02/15/22  Discharge Physician: Marguerita Merles, DO   PCP: Elfredia Nevins, MD   Recommendations at discharge:   Follow-up with PCP within 1 to 2 weeks and repeat CBC, CMP, mag, Phos within 1 week Follow-up with podiatry within 1 week and continue dressings until seen by the provider Follow-up with infectious disease in outpatient setting and continue antibiotics for 2 weeks with completion on February 25, 2022  Discharge Diagnoses: Principal Problem:   Diabetic foot infection (HCC) Active Problems:   Controlled IDDM-2 with hyperglycemia   Chronic pain   HTN (hypertension)   Anxiety   Abnormal surgical wound   Spinal stenosis of lumbar region   Microcytic anemia   Hypokalemia   Equinus contracture of left ankle  Resolved Problems:   * No resolved hospital problems. *  Hospital Course: Patient is a 62 year old Hispanic male with past medical history significant for diabetes mellitus type 2, hypertension, chronic pain, spinal stenosis as well as history of recurrent foot infections status post 7 different toe amputations in the right and left feet who came to the hospital with concern for left foot infection.  Had a partial left fourth toe amputation 1 to 2 weeks ago and was placed on oral antibiotics there is concern about him developing infection at the surgical site with increased redness, pain, as well as other issues so he came back to the hospital for IV antibiotics and repeat surgery.  Patient underwent a left metatarsal amputation along with a tendo Achilles lengthening and is postoperative day 1 and initially was taken to the OR for left fourth toe amputation at the metatarsal joint as well as I&D of the abscess of the tendon sheath with wide debridement and multiple fascial planes.  Antibiotics are swelling pansensitive E. coli, Prevotella Bivia and  rare gram-positive rods.  Infectious disease was consulted and he has been placed on IV Unasyn and IV daptomycin.  He is currently having significant pain and will need further ambulation with PT and OT to evaluate for safe discharge disposition but currently they are recommending Home Health    Infectious disease followed up and Ergen and now discontinue the daptomycin and they feel that given the source control with the TMA they do not feel that he needs IV antibiotics as well as a prolonged duration of antibiotics and they are planning to switch him to p.o. Augmentin upon discharge to complete 2 weeks of therapy with estimated length of treatment to be completed on 02/25/2022.  He is cleared from a podiatry standpoint that he has got his Multi-Podus splint and ID has signed off his case as well.Marland Kitchen  He will need to follow-up with PCP, podiatry as well as ID in outpatient setting  Assessment and Plan:  Diabetic foot infection, concern for osteomyelitis/cellulitis and is status post left transmetatarsal amputation with a tendo Achilles lengthening postoperative day 3 -On the left foot.  He has been having several surgeries to the right foot, now on the left. -Continue IV antibiotics with IV Daptomycin and IV Unasyn -Podiatry consulted, he was taken to the OR on 1/19 and is status post left fourth toe amputation at the metatarsophalangeal joint as well as I&D of abscess tendon sheath with wide debridement and multiple fascial planes.  -He underwent repeat surgery 02/11/22 for transmetatarsal amputation -ID also consulted, intraoperative microbiology shows pansensitive E. coli, rare GPC  in pairs, continue antibiotics per ID, currently on Unasyn and daptomycin but now ID has stopped this as below -Cx from 1/19 done with moderate gram-positive rods, few S E. coli, moderate Prevotella BV a beta-lactamase positive; E. coli was pansensitive -Blood Cx x2 showed NGTD at 5 days  -Vascular consulted, underwent  angiogram 1/22 which showed bilateral single-vessel peroneal outflow with bilateral foot filling through collaterals.   -Vascular feels like he is currently optimized.  -Continue ASA 81 mg po Daily and Atorvastatin 40 mg po Daily -Pain Control as below  -Podiatry managing following recommending Multipodus boot as well as nonweightbearing to left lower extremity as well as keeping dressing intact until follow-up in office -Antibiotic management per ID however the podiatrist feels that he has worse control in the left foot but recommending prophylactic antibiotics as amputation site heals; now ID has discontinued daptomycin and changing to p.o. Augmentin for total of 2 weeks with estimated treatment therapy to be completed on 02/25/2022 -WBC Trend: Recent Labs  Lab 02/07/22 0419 02/08/22 0338 02/09/22 0348 02/11/22 0241 02/12/22 0253 02/13/22 0215 02/14/22 0152  WBC 11.6* 11.7* 10.4 8.8 8.8 10.6* 10.5  -PT OT recommending home health will continue at discharge   Type 2 Diabetes Mellitus -Continue Semglee 30 units BID and Moderate Novolog SSI AC -CBGs ranging from 87-237   Essential Hypertension -Continue Amlodipine 5 mg po daily, Diltiazem 420 mg po Daily, Losartan 100 mg po Daily, and Furosemide 20 mg po Daily. -Will discontinue IVF hydration with NS at 100 mL/hr -C/w Hydralazine 5 mg IV q20 min PRN HBP x 2 Doses and IV Labetalol 10 mg IV q10 minPRN HBP x 4 doses -Continue to Monitor BP per Protocol  -Last BP reading was 138/80   Anxiety -Continue home medications with Buproprion 300 mg po daily and Alprazolam 0.5 mg po BID   Chronic pain, spinal stenosis -Continue home medications with Oxycodone 10 mg po q4hprn Severe Pain, and C/w Gabapentin 800 mg po TID, and Hydromorphone 0.5-1 mg q2hprn IV Severe Breakthrough Pain  -Also getting Acetaminophen 1000 mg po q6h -Follow up with Pain Clinic    Thrombocytosis -Likely Reactive -Plt Count Trend: Recent Labs  Lab 02/07/22 0419  02/08/22 0338 02/09/22 0348 02/11/22 0241 02/12/22 0253 02/13/22 0215 02/14/22 0152  PLT 357 330 358 403* 453* 383 435*  -Continue to Monitor for S/Sx of bleeding; No overt bleeding noted -Repeat CBC in the AM    Elevated ALT -LFT Trend: Recent Labs  Lab 02/06/22 1113 02/08/22 0338 02/11/22 0241 02/13/22 0215 02/14/22 0152  AST 20 18 40 17 26  ALT 27 21 54* 34 32  -Continue to Monitor and Trend and repeat CMP in the AM    Hypoalbuminemia -Albumin Level Trend: Recent Labs  Lab 02/06/22 1113 02/08/22 0338 02/11/22 0241 02/13/22 0215 02/14/22 0152  ALBUMIN 2.7* 2.2* 2.2* 2.2* 2.2*  -Continue to Monitor and Trend and repeat CMP in the AM    Hypokalemia, improved -K+ Trend: Recent Labs  Lab 02/08/22 0338 02/09/22 0348 02/10/22 0315 02/11/22 0241 02/12/22 0253 02/13/22 0215 02/14/22 0152  K 3.7 3.5 3.3* 3.6 4.0 3.4* 3.8   -Continue to Monitor and Replete as Necessary -Repeat CMP in the AM    Anemia of Chronic Disease/Microcytic Anemia -Hgb/Hct Trend: Recent Labs  Lab 02/07/22 0419 02/08/22 0338 02/09/22 0348 02/11/22 0241 02/12/22 0253 02/13/22 0215 02/14/22 0152  HGB 10.1* 9.7* 10.2* 9.4* 8.8* 8.1* 8.3*  HCT 31.5* 30.4* 32.2* 29.6* 29.3* 26.1* 27.2*  MCV 78.4*  78.4* 80.3 78.9* 81.2 79.3* 80.7  -Checked Anemia Panel with an iron level of 15, UIBC of 250, TIBC 265, saturation ratios of 6%, ferritin level 56, folate level 11.2 and a vitamin B12 level of 202 -Continue to Monitor for S/Sx of Bleeding; No overt bleeding noted -Repeat CBC int eh AM    Overweight -Complicates overall prognosis and care -Estimated body mass index is 28 kg/m as calculated from the following:   Height as of this encounter: 5\' 9"  (1.753 m).   Weight as of this encounter: 86 kg.  -Weight Loss and Dietary Counseling given   Pain control - [place] Controlled Substance Reporting System database was reviewed. and patient was instructed, not to drive, operate heavy  machinery, perform activities at heights, swimming or participation in water activities or provide baby-sitting services while on Pain, Sleep and Anxiety Medications; until their outpatient Physician has advised to do so again. Also recommended to not to take more than prescribed Pain, Sleep and Anxiety Medications.   Consultants: Podiatry, ID, Vascular Surgery Procedures performed: As above  Disposition: Home health Diet recommendation:  Cardiac and Carb modified diet DISCHARGE MEDICATION: Allergies as of 02/14/2022       Reactions   Latex Rash, Other (See Comments)   Redness         Medication List     STOP taking these medications    cephALEXin 500 MG capsule Commonly known as: KEFLEX   oxyCODONE-acetaminophen 10-325 MG tablet Commonly known as: PERCOCET   sulfamethoxazole-trimethoprim 800-160 MG tablet Commonly known as: BACTRIM DS   tiZANidine 4 MG tablet Commonly known as: ZANAFLEX       TAKE these medications    Acetaminophen Extra Strength 500 MG Tabs Take 2 tablets (1,000 mg total) by mouth every 6 (six) hours as needed for up to 20 doses (pain).   ALPRAZolam 0.5 MG tablet Commonly known as: XANAX Take 0.5 mg by mouth 3 (three) times daily.   amLODipine 5 MG tablet Commonly known as: NORVASC Take 5 mg by mouth daily.   amoxicillin-clavulanate 875-125 MG tablet Commonly known as: AUGMENTIN Take 1 tablet by mouth 2 (two) times daily for 12 days.   aspirin EC 81 MG tablet Take 1 tablet (81 mg total) by mouth daily with breakfast.   aspirin-acetaminophen-caffeine 250-250-65 MG tablet Commonly known as: EXCEDRIN MIGRAINE Take 1 tablet by mouth every 6 (six) hours as needed for headache or migraine.   atorvastatin 20 MG tablet Commonly known as: LIPITOR Take 20 mg by mouth daily.   buPROPion 300 MG 24 hr tablet Commonly known as: WELLBUTRIN XL Take 300 mg by mouth daily.   diclofenac Sodium 1 % Gel Commonly known as: VOLTAREN Apply 2 g  topically daily as needed (pain).   diltiazem 420 MG 24 hr capsule Commonly known as: TIAZAC Take 420 mg by mouth daily.   DULoxetine 60 MG capsule Commonly known as: CYMBALTA Take 60 mg by mouth daily.   furosemide 20 MG tablet Commonly known as: LASIX Take 20 mg by mouth daily.   gabapentin 800 MG tablet Commonly known as: NEURONTIN Take 800 mg by mouth 3 (three) times daily.   insulin glargine 100 UNIT/ML injection Commonly known as: LANTUS Inject 80 Units into the skin daily.   losartan 100 MG tablet Commonly known as: COZAAR Take 100 mg by mouth daily.   metFORMIN 500 MG tablet Commonly known as: GLUCOPHAGE Take 1,000 mg by mouth in the morning and at bedtime. In the morning &  1300   morphine 15 MG tablet Commonly known as: MSIR Take 15 mg by mouth every 12 (twelve) hours.   multivitamin with minerals Tabs tablet Take 1 tablet by mouth daily.   pantoprazole 40 MG tablet Commonly known as: PROTONIX TAKE 1 TABLET(40 MG) BY MOUTH DAILY What changed: See the new instructions.   polyethylene glycol powder 17 GM/SCOOP powder Commonly known as: GLYCOLAX/MIRALAX Take 1 capful (17 g) by mouth daily as needed for mild constipation.   sitaGLIPtin 100 MG tablet Commonly known as: JANUVIA Take 100 mg by mouth daily.               Discharge Care Instructions  (From admission, onward)           Start     Ordered   02/14/22 0000  Discharge wound care:       Comments: Please change the dressing today with saline gauze packing followed by 4 x 4 gauze dry Kerlix and Ace bandage. Further care per Podiatry   02/14/22 1411            Follow-up Information     Elfredia Nevins, MD Follow up.   Specialty: Internal Medicine Contact information: [address] [place] [address] [phone]         [facility] Follow up in 365 day(s).   Specialty: Vascular Surgery Why: Follow up with our office in 1  year Office will call to arrange your appt Contact information: [address] [place] [address] [phone]        [facility] Follow up.   Why: HHRN/PT/OT arranged- they will contact you to schedule Contact information: [address] [place] [address] [phone]                Discharge Exam: Filed Weights   02/06/22 1103 02/11/22 1607  Weight: 85.3 kg 86 kg   Vitals:   02/14/22 0752 02/14/22 1122  BP: (!) 142/72 138/80  Pulse: 95 95  Resp: 15 (!) 24  Temp: 98.2 F (36.8 C) 98.4 F (36.9 C)  SpO2: 99% 96%   Examination: Physical Exam:  Constitutional: WN/WD overweight male in no acute distress Respiratory: Clear to auscultation bilaterally, no wheezing, rales, rhonchi or crackles. Normal respiratory effort and patient is not tachypenic. No accessory muscle use.  Unlabored breathing Cardiovascular: RRR, no murmurs / rubs / gallops. S1 and S2 auscultated.  Has mild lower extremity edema Abdomen: Soft, non-tender, n distended secondary body habitus. Bowel sounds positive.  GU: Deferred. Musculoskeletal: No clubbing / cyanosis of digits/nails. No joint deformity upper and lower extremities.  Skin: No rashes, lesions, ulcers limited skin evaluation. No induration; Warm and dry.  Neurologic: CN 2-12 grossly intact with no focal deficits. Romberg sign and cerebellar reflexes not assessed.  Psychiatric: Normal judgment and insight. Alert and oriented x 3. Normal mood and appropriate affect.   Condition at discharge: stable  The results of significant diagnostics from this hospitalization (including imaging, microbiology, ancillary and laboratory) are listed below for reference.   Imaging Studies: PERIPHERAL VASCULAR CATHETERIZATION  Result Date: 02/10/2022 Images from the original result were not included. Patient name: ANQUAN GORELICK MRN: [id number] DOB: 05-21-1960 Sex: male 02/10/2022 Pre-operative  Diagnosis: Left lower extremity critical ischemia with tissue loss in the foot Post-operative diagnosis:  Same Surgeon:  Victorino Sparrow, MD Procedure Performed: 1.  Ultrasound-guided micropuncture access of the right common femoral artery 2.  Aortogram  3.  Bilateral lower extremity angiogram 4.  Third cannulation, left lower extremity angiogram 5.  Moderate sedation time 50 minutes 6.  Contrast volume 175 mL 7.  Device assisted closure-Mynx Indications: Patient is a 62 year old male with longstanding diabetes who presented to the hospital with new onset wounds on the left lower extremity.  On exam, he had nonpalpable pulses.  After discussing the risk and benefits of left lower extremity angiography in an effort to define and improve distal perfusion for wound healing, Kathlene November elected to proceed. Findings: Aortogram: No flow-limiting stenosis appreciated in the aortoiliac segments bilaterally On the right: Widely patent common femoral artery, profunda, superficial femoral artery, popliteal artery.  Single-vessel, peroneal artery outflow with occlusion of the anterior tibial and posterior tibial arteries.  The foot fills via collaterals. On the left: Widely patent common femoral artery, profunda, superficial femoral artery, popliteal artery.  Single-vessel peroneal artery outflow with atretic posterior tibial and anterior tibial arteries.  These occluded at the mid tibia.  The peroneal artery continues into the foot through diffuse collaterals.  Procedure:  The patient was identified in the holding area and taken to room 8.  The patient was then placed supine on the table and prepped and draped in the usual sterile fashion.  A time out was called.  Ultrasound was used to evaluate the right common femoral artery.  It was patent .  A digital ultrasound image was acquired.  A micropuncture needle was used to access the right common femoral artery under ultrasound guidance.  An 018 wire was advanced without resistance and  a micropuncture sheath was placed.  The 018 wire was removed and a benson wire was placed.  The micropuncture sheath was exchanged for a 5 french sheath.  An omniflush catheter was advanced over the wire to the level of L-1.  An abdominal angiogram was obtained.  The Omni Flush catheter was pulled down to the terminal aorta for bilateral lower extremity runoff.  See results above.  In an effort to improve imaging quality, a wire was used to bring the Omni Flush up and over into the left superficial femoral artery to assess flow at the level of the ankle and foot.  See results above.  Impression: Diagnostic angiogram with brisk, single-vessel  peroneal outflow to the feet bilaterally. There is diffuse collateralization in the left foot. No names vessels. Fara Olden, MD [facility] [place] Office: [phone]   DG Foot Complete Left  Result Date: 02/06/2022 CLINICAL DATA:  Left foot infection, recent digital amputation EXAM: LEFT FOOT - COMPLETE 3+ VIEW COMPARISON:  09/26/2021 FINDINGS: There is no evidence of fracture or dislocation. Status post distal digital amputation of the left fourth digit. Unchanged chronic bony erosion of the tufts of the left second and third digits. Diffuse soft tissue edema of the forefoot. Vascular calcinosis. IMPRESSION: 1. Status post distal digital amputation of the distal left fourth digit. 2. Unchanged chronic bony erosion of the tufts of the left second and third digits. 3. MRI is the test of choice for the evaluation of known or suspected osteomyelitis. 4. Diffuse soft tissue edema of the forefoot. Electronically Signed   By: Jearld Lesch M.D.   On: 02/06/2022 12:04    Microbiology: Results for orders placed or performed during the hospital encounter of 02/06/22  Blood culture (routine x 2)     Status: None   Collection Time: 02/06/22 11:25 AM   Specimen: BLOOD  Result Value Ref Range Status  Specimen Description BLOOD BLOOD RIGHT FOREARM   Final   Special Requests   Final    BOTTLES DRAWN AEROBIC AND ANAEROBIC Blood Culture adequate volume   Culture   Final    NO GROWTH 5 DAYS Performed at [facility], [address], [place], [place] [address]    Report Status 02/11/2022 FINAL  Final  Blood culture (routine x 2)     Status: None   Collection Time: 02/06/22 11:27 AM   Specimen: BLOOD  Result Value Ref Range Status   Specimen Description BLOOD BLOOD LEFT FOREARM  Final   Special Requests   Final    BOTTLES DRAWN AEROBIC AND ANAEROBIC Blood Culture adequate volume   Culture   Final    NO GROWTH 5 DAYS Performed at [facility], [address] [place], [place] [address]    Report Status 02/11/2022 FINAL  Final  Surgical pcr screen     Status: None   Collection Time: 02/07/22  3:07 PM  Result Value Ref Range Status   MRSA, PCR NEGATIVE NEGATIVE Final   Staphylococcus aureus NEGATIVE NEGATIVE Final    Comment: (NOTE) The Xpert SA Assay (FDA approved for NASAL specimens in patients 89 years of age and older), is one component of a comprehensive surveillance program. It is not intended to diagnose infection nor to guide or monitor treatment. Performed at [facility], [address], [place], [place] [address]   Aerobic/Anaerobic Culture w Gram Stain (surgical/deep wound)     Status: None (Preliminary result)   Collection Time: 02/07/22  6:06 PM   Specimen: Soft Tissue, Other  Result Value Ref Range Status   Specimen Description TISSUE LEFT FOOT  Final   Special Requests SWAB OF DEEP TISSUE PT ON DACTOMYCIN  Final   Gram Stain   Final    RARE WBC PRESENT, PREDOMINANTLY PMN MODERATE GRAM POSITIVE RODS RARE GRAM POSITIVE COCCI IN PAIRS    Culture   Final    FEW ESCHERICHIA COLI MODERATE PREVOTELLA BIVIA BETA LACTAMASE POSITIVE RARE PROPIONIBACTERIUM SPECIES Standardized susceptibility testing for this organism is not available. Performed at [facility], [address]  [place], [place] [address]    Report Status PENDING  Incomplete   Organism ID, Bacteria ESCHERICHIA COLI  Final      Susceptibility   Escherichia coli - MIC*    AMPICILLIN 4 SENSITIVE Sensitive     CEFAZOLIN <=4 SENSITIVE Sensitive     CEFEPIME <=0.12 SENSITIVE Sensitive     CEFTAZIDIME <=1 SENSITIVE Sensitive     CEFTRIAXONE <=0.25 SENSITIVE Sensitive     CIPROFLOXACIN <=0.25 SENSITIVE Sensitive     GENTAMICIN <=1 SENSITIVE Sensitive     IMIPENEM <=0.25 SENSITIVE Sensitive     TRIMETH/SULFA <=20 SENSITIVE Sensitive     AMPICILLIN/SULBACTAM <=2 SENSITIVE Sensitive     PIP/TAZO <=4 SENSITIVE Sensitive     * FEW ESCHERICHIA COLI   Labs: CBC: Recent Labs  Lab 02/09/22 0348 02/11/22 0241 02/12/22 0253 02/13/22 0215 02/14/22 0152  WBC 10.4 8.8 8.8 10.6* 10.5  NEUTROABS  --   --   --  7.3 7.2  HGB 10.2* 9.4* 8.8* 8.1* 8.3*  HCT 32.2* 29.6* 29.3* 26.1* 27.2*  MCV 80.3 78.9* 81.2 79.3* 80.7  PLT 358 403* 453* 383 435*   Basic Metabolic Panel: Recent Labs  Lab 02/09/22 0348 02/10/22 0315 02/11/22 0241 02/12/22 0253 02/13/22 0215 02/14/22 0152  NA 138 141 139 140 137 138  K  3.5 3.3* 3.6 4.0 3.4* 3.8  CL 103 107 107 104 104 105  CO2 25 24 23 26 25 25   GLUCOSE 209* 90 171* 317* 301* 298*  BUN 15 14 11 14 16 14   CREATININE 0.90 0.78 0.73 0.89 0.95 0.79  CALCIUM 8.5* 8.4* 8.3* 8.5* 8.2* 8.3*  MG 2.0  --  2.0  --  2.0 2.0  PHOS  --   --   --   --  3.6 2.7   Liver Function Tests: Recent Labs  Lab 02/11/22 0241 02/13/22 0215 02/14/22 0152  AST 40 17 26  ALT 54* 34 32  ALKPHOS 102 90 97  BILITOT 0.1* <0.1* <0.1*  PROT 5.5* 5.4* 5.8*  ALBUMIN 2.2* 2.2* 2.2*   CBG: Recent Labs  Lab 02/13/22 1142 02/13/22 1653 02/13/22 2102 02/14/22 0601 02/14/22 1119  GLUCAP 87 211* 221* 237* 110*   Discharge time spent: greater than 30 minutes.  Signed: Marguerita Merles, DO [facility] 02/15/2022

## 2022-02-16 LAB — AEROBIC/ANAEROBIC CULTURE W GRAM STAIN (SURGICAL/DEEP WOUND)

## 2022-02-17 ENCOUNTER — Encounter: Payer: Self-pay | Admitting: Infectious Diseases

## 2022-02-17 NOTE — Progress Notes (Signed)
ID Brief note     Component 10 d ago  Specimen Description TISSUE LEFT FOOT  Special Requests SWAB OF DEEP TISSUE PT ON DACTOMYCIN  Gram Stain RARE WBC PRESENT, PREDOMINANTLY PMN MODERATE GRAM POSITIVE RODS RARE GRAM POSITIVE COCCI IN PAIRS  Culture FEW ESCHERICHIA COLI MODERATE PREVOTELLA BIVIA BETA LACTAMASE POSITIVE RARE PROPIONIBACTERIUM SPECIES Standardized susceptibility testing for this organism is not available. Performed at [facility], [place] [address], [place], [place] [address]  Report Status 02/16/2022 FINAL  Organism ID, Bacteria ESCHERICHIA COLI  Resulting Agency [facility]     Susceptibility   Escherichia coli    MIC    AMPICILLIN 4 SENSITIVE Sensitive    AMPICILLIN/SULBACTAM <=2 SENSITIVE Sensitive    CEFAZOLIN <=4 SENSITIVE Sensitive    CEFEPIME <=0.12 SENS... Sensitive    CEFTAZIDIME <=1 SENSITIVE Sensitive    CEFTRIAXONE <=0.25 SENS... Sensitive    CIPROFLOXACIN <=0.25 SENS... Sensitive    GENTAMICIN <=1 SENSITIVE Sensitive    IMIPENEM <=0.25 SENS... Sensitive    PIP/TAZO <=4 SENSITIVE Sensitive    TRIMETH/SULFA <=20 SENSIT... Sensitive           Susceptibility Comments  Escherichia coli

## 2022-02-18 DIAGNOSIS — Z7982 Long term (current) use of aspirin: Secondary | ICD-10-CM | POA: Diagnosis not present

## 2022-02-18 DIAGNOSIS — D509 Iron deficiency anemia, unspecified: Secondary | ICD-10-CM | POA: Diagnosis not present

## 2022-02-18 DIAGNOSIS — G8929 Other chronic pain: Secondary | ICD-10-CM | POA: Diagnosis not present

## 2022-02-18 DIAGNOSIS — Z79891 Long term (current) use of opiate analgesic: Secondary | ICD-10-CM | POA: Diagnosis not present

## 2022-02-18 DIAGNOSIS — E876 Hypokalemia: Secondary | ICD-10-CM | POA: Diagnosis not present

## 2022-02-18 DIAGNOSIS — Z4781 Encounter for orthopedic aftercare following surgical amputation: Secondary | ICD-10-CM | POA: Diagnosis not present

## 2022-02-18 DIAGNOSIS — L0889 Other specified local infections of the skin and subcutaneous tissue: Secondary | ICD-10-CM | POA: Diagnosis not present

## 2022-02-18 DIAGNOSIS — I10 Essential (primary) hypertension: Secondary | ICD-10-CM | POA: Diagnosis not present

## 2022-02-18 DIAGNOSIS — K219 Gastro-esophageal reflux disease without esophagitis: Secondary | ICD-10-CM | POA: Diagnosis not present

## 2022-02-18 DIAGNOSIS — Z7984 Long term (current) use of oral hypoglycemic drugs: Secondary | ICD-10-CM | POA: Diagnosis not present

## 2022-02-18 DIAGNOSIS — M1990 Unspecified osteoarthritis, unspecified site: Secondary | ICD-10-CM | POA: Diagnosis not present

## 2022-02-18 DIAGNOSIS — E8809 Other disorders of plasma-protein metabolism, not elsewhere classified: Secondary | ICD-10-CM | POA: Diagnosis not present

## 2022-02-18 DIAGNOSIS — D696 Thrombocytopenia, unspecified: Secondary | ICD-10-CM | POA: Diagnosis not present

## 2022-02-18 DIAGNOSIS — E1165 Type 2 diabetes mellitus with hyperglycemia: Secondary | ICD-10-CM | POA: Diagnosis not present

## 2022-02-18 DIAGNOSIS — M24572 Contracture, left ankle: Secondary | ICD-10-CM | POA: Diagnosis not present

## 2022-02-18 DIAGNOSIS — M48061 Spinal stenosis, lumbar region without neurogenic claudication: Secondary | ICD-10-CM | POA: Diagnosis not present

## 2022-02-18 DIAGNOSIS — D638 Anemia in other chronic diseases classified elsewhere: Secondary | ICD-10-CM | POA: Diagnosis not present

## 2022-02-18 DIAGNOSIS — B9689 Other specified bacterial agents as the cause of diseases classified elsewhere: Secondary | ICD-10-CM | POA: Diagnosis not present

## 2022-02-18 DIAGNOSIS — E11628 Type 2 diabetes mellitus with other skin complications: Secondary | ICD-10-CM | POA: Diagnosis not present

## 2022-02-18 DIAGNOSIS — E1140 Type 2 diabetes mellitus with diabetic neuropathy, unspecified: Secondary | ICD-10-CM | POA: Diagnosis not present

## 2022-02-18 DIAGNOSIS — Z89432 Acquired absence of left foot: Secondary | ICD-10-CM | POA: Diagnosis not present

## 2022-02-18 DIAGNOSIS — Z9181 History of falling: Secondary | ICD-10-CM | POA: Diagnosis not present

## 2022-02-19 DIAGNOSIS — Z7984 Long term (current) use of oral hypoglycemic drugs: Secondary | ICD-10-CM | POA: Diagnosis not present

## 2022-02-19 DIAGNOSIS — M1990 Unspecified osteoarthritis, unspecified site: Secondary | ICD-10-CM | POA: Diagnosis not present

## 2022-02-19 DIAGNOSIS — D638 Anemia in other chronic diseases classified elsewhere: Secondary | ICD-10-CM | POA: Diagnosis not present

## 2022-02-19 DIAGNOSIS — E876 Hypokalemia: Secondary | ICD-10-CM | POA: Diagnosis not present

## 2022-02-19 DIAGNOSIS — E11628 Type 2 diabetes mellitus with other skin complications: Secondary | ICD-10-CM | POA: Diagnosis not present

## 2022-02-19 DIAGNOSIS — Z7982 Long term (current) use of aspirin: Secondary | ICD-10-CM | POA: Diagnosis not present

## 2022-02-19 DIAGNOSIS — M48061 Spinal stenosis, lumbar region without neurogenic claudication: Secondary | ICD-10-CM | POA: Diagnosis not present

## 2022-02-19 DIAGNOSIS — E1140 Type 2 diabetes mellitus with diabetic neuropathy, unspecified: Secondary | ICD-10-CM | POA: Diagnosis not present

## 2022-02-19 DIAGNOSIS — E1165 Type 2 diabetes mellitus with hyperglycemia: Secondary | ICD-10-CM | POA: Diagnosis not present

## 2022-02-19 DIAGNOSIS — M24572 Contracture, left ankle: Secondary | ICD-10-CM | POA: Diagnosis not present

## 2022-02-19 DIAGNOSIS — G8929 Other chronic pain: Secondary | ICD-10-CM | POA: Diagnosis not present

## 2022-02-19 DIAGNOSIS — K219 Gastro-esophageal reflux disease without esophagitis: Secondary | ICD-10-CM | POA: Diagnosis not present

## 2022-02-19 DIAGNOSIS — I10 Essential (primary) hypertension: Secondary | ICD-10-CM | POA: Diagnosis not present

## 2022-02-19 DIAGNOSIS — D509 Iron deficiency anemia, unspecified: Secondary | ICD-10-CM | POA: Diagnosis not present

## 2022-02-19 DIAGNOSIS — Z79891 Long term (current) use of opiate analgesic: Secondary | ICD-10-CM | POA: Diagnosis not present

## 2022-02-19 DIAGNOSIS — Z89432 Acquired absence of left foot: Secondary | ICD-10-CM | POA: Diagnosis not present

## 2022-02-19 DIAGNOSIS — Z9181 History of falling: Secondary | ICD-10-CM | POA: Diagnosis not present

## 2022-02-19 DIAGNOSIS — L0889 Other specified local infections of the skin and subcutaneous tissue: Secondary | ICD-10-CM | POA: Diagnosis not present

## 2022-02-19 DIAGNOSIS — B9689 Other specified bacterial agents as the cause of diseases classified elsewhere: Secondary | ICD-10-CM | POA: Diagnosis not present

## 2022-02-19 DIAGNOSIS — D696 Thrombocytopenia, unspecified: Secondary | ICD-10-CM | POA: Diagnosis not present

## 2022-02-19 DIAGNOSIS — Z4781 Encounter for orthopedic aftercare following surgical amputation: Secondary | ICD-10-CM | POA: Diagnosis not present

## 2022-02-19 DIAGNOSIS — E8809 Other disorders of plasma-protein metabolism, not elsewhere classified: Secondary | ICD-10-CM | POA: Diagnosis not present

## 2022-02-20 ENCOUNTER — Ambulatory Visit (INDEPENDENT_AMBULATORY_CARE_PROVIDER_SITE_OTHER): Payer: 59 | Admitting: Podiatry

## 2022-02-20 DIAGNOSIS — Z9889 Other specified postprocedural states: Secondary | ICD-10-CM

## 2022-02-20 NOTE — Progress Notes (Signed)
Subjective:  Patient ID: [name], male    DOB: 15-May-1960,  MRN: [id number]  Chief Complaint  Patient presents with   Routine Post Op    RM 19  POV #1 DOS 01/24/2022 LT 4TH TOE AMPUTATION PARTIAL. Pt has a wound therapy system on that he states has noe been changed since 1/18 and does not work. PT has staples in left ankle. Dr. Posey Pronto instructed me to removed. Pt did not tolerate staple removal well and asked for me to stop.     DOS: 02/11/2022 Procedure: Left transmetatarsal amputation  62 y.o. male returns for post-op check.  Patient states he is doing well.  Denies any other acute complaints.  Having some pain.  He is known to Dr. Lynnell Catalan who did the surgery  Review of Systems: Negative except as noted in the HPI. Denies N/V/F/Ch.  Past Medical History:  Diagnosis Date   Acute biliary pancreatitis 10/23/2013   Acute osteomyelitis of right foot ([place]) 05/22/2021   AKI (acute kidney injury) ([place]) 05/22/2021   Arthritis    Biliary colic [id number]   Chronic osteomyelitis of toe of right foot ([place])    Diabetes mellitus    GERD (gastroesophageal reflux disease)    Headache(784.0)    History of kidney stones    Hypertension    Kidney stones    Neuropathy    Osteomyelitis of fourth toe of right foot ([place]) 06/16/2019    Current Outpatient Medications:    acetaminophen (TYLENOL) 500 MG tablet, Take 2 tablets (1,000 mg total) by mouth every 6 (six) hours as needed for up to 20 doses (pain)., Disp: 40 tablet, Rfl: 0   ALPRAZolam (XANAX) 0.5 MG tablet, Take 0.5 mg by mouth 3 (three) times daily., Disp: , Rfl: 2   amLODipine (NORVASC) 5 MG tablet, Take 5 mg by mouth daily., Disp: , Rfl:    amoxicillin-clavulanate (AUGMENTIN) 875-125 MG tablet, Take 1 tablet by mouth 2 (two) times daily for 28 days., Disp: 56 tablet, Rfl: 0   aspirin EC 81 MG tablet, Take 1 tablet (81 mg total) by mouth daily with breakfast., Disp: 120 tablet, Rfl: 3   aspirin-acetaminophen-caffeine (EXCEDRIN  MIGRAINE) 250-250-65 MG tablet, Take 1 tablet by mouth every 6 (six) hours as needed for headache or migraine., Disp: , Rfl:    atorvastatin (LIPITOR) 20 MG tablet, Take 20 mg by mouth daily., Disp: , Rfl:    buPROPion (WELLBUTRIN XL) 300 MG 24 hr tablet, Take 300 mg by mouth daily., Disp: , Rfl:    diclofenac Sodium (VOLTAREN) 1 % GEL, Apply 2 g topically daily as needed (pain)., Disp: , Rfl:    diltiazem (TIAZAC) 420 MG 24 hr capsule, Take 420 mg by mouth daily., Disp: , Rfl:    DULoxetine (CYMBALTA) 60 MG capsule, Take 60 mg by mouth daily., Disp: , Rfl:    furosemide (LASIX) 20 MG tablet, Take 20 mg by mouth daily., Disp: , Rfl:    gabapentin (NEURONTIN) 800 MG tablet, Take 800 mg by mouth 3 (three) times daily., Disp: , Rfl:    insulin glargine (LANTUS) 100 UNIT/ML injection, Inject 80 Units into the skin daily., Disp: , Rfl:    losartan (COZAAR) 100 MG tablet, Take 100 mg by mouth daily., Disp: , Rfl:    metFORMIN (GLUCOPHAGE) 500 MG tablet, Take 1,000 mg by mouth in the morning and at bedtime. In the morning & 1300, Disp: , Rfl:    morphine (MSIR) 15 MG tablet, Take 15 mg by mouth  every 12 (twelve) hours., Disp: , Rfl:    Multiple Vitamin (MULTIVITAMIN WITH MINERALS) TABS tablet, Take 1 tablet by mouth daily., Disp: , Rfl:    oxyCODONE (ROXICODONE) 15 MG immediate release tablet, Take 15 mg by mouth every 4 (four) hours., Disp: , Rfl:    pantoprazole (PROTONIX) 40 MG tablet, TAKE 1 TABLET(40 MG) BY MOUTH DAILY (Patient taking differently: Take 40 mg by mouth daily.), Disp: 30 tablet, Rfl: 0   polyethylene glycol powder (GLYCOLAX/MIRALAX) 17 GM/SCOOP powder, Take 1 capful (17 g) by mouth daily as needed for mild constipation., Disp: 238 g, Rfl: 0   sitaGLIPtin (JANUVIA) 100 MG tablet, Take 100 mg by mouth daily., Disp: , Rfl:    XTAMPZA ER 9 MG C12A, Take 1 capsule by mouth 2 (two) times daily., Disp: , Rfl:   Social History   Tobacco Use  Smoking Status Former   Packs/day: 1.00    Years: 7.00   Total pack years: 7.00   Types: Cigarettes   Quit date: 01/30/1982   Years since quitting: 40.1  Smokeless Tobacco Never    Allergies  Allergen Reactions   Latex Rash and Other (See Comments)    Redness    Objective:  There were no vitals filed for this visit. There is no height or weight on file to calculate BMI. Constitutional Well developed. Well nourished.  Vascular Foot warm and well perfused. Capillary refill normal to all digits.   Neurologic Normal speech. Oriented to person, place, and time. Epicritic sensation to light touch grossly present bilaterally.  Dermatologic Skin healing well without signs of infection. Skin edges well coapted without signs of infection.  Orthopedic: Tenderness to palpation noted about the surgical site.   Radiographs: 3 views of skeletally mature adult left foot:Transmetatarsal amputation noted.  Sharp surgical margins noted. Assessment:   1. Post-operative state    Plan:  Patient was evaluated and treated and all questions answered.  S/p foot surgery left -Progressing as expected post-operatively. -XR: See above -WB Status: Nonweightbearing in left lower extremity -Sutures: Intact.  No clinical signs of [place] is noted no complication noted. -Medications: None -Foot redressed.

## 2022-02-25 ENCOUNTER — Inpatient Hospital Stay: Payer: 59 | Admitting: Infectious Diseases

## 2022-02-25 ENCOUNTER — Ambulatory Visit (INDEPENDENT_AMBULATORY_CARE_PROVIDER_SITE_OTHER): Payer: 59 | Admitting: Podiatry

## 2022-02-25 DIAGNOSIS — Z89432 Acquired absence of left foot: Secondary | ICD-10-CM

## 2022-02-26 ENCOUNTER — Telehealth: Payer: Self-pay

## 2022-02-26 ENCOUNTER — Encounter: Payer: Self-pay | Admitting: Infectious Diseases

## 2022-02-26 ENCOUNTER — Ambulatory Visit (INDEPENDENT_AMBULATORY_CARE_PROVIDER_SITE_OTHER): Payer: 59 | Admitting: Infectious Diseases

## 2022-02-26 ENCOUNTER — Other Ambulatory Visit: Payer: Self-pay

## 2022-02-26 VITALS — BP 130/71 | HR 114 | Temp 98.3°F | Wt 182.4 lb

## 2022-02-26 DIAGNOSIS — E11628 Type 2 diabetes mellitus with other skin complications: Secondary | ICD-10-CM

## 2022-02-26 DIAGNOSIS — M869 Osteomyelitis, unspecified: Secondary | ICD-10-CM

## 2022-02-26 DIAGNOSIS — L089 Local infection of the skin and subcutaneous tissue, unspecified: Secondary | ICD-10-CM

## 2022-02-26 DIAGNOSIS — E1169 Type 2 diabetes mellitus with other specified complication: Secondary | ICD-10-CM

## 2022-02-26 DIAGNOSIS — Z5181 Encounter for therapeutic drug level monitoring: Secondary | ICD-10-CM | POA: Diagnosis not present

## 2022-02-26 MED ORDER — AMOXICILLIN-POT CLAVULANATE 875-125 MG PO TABS: 1.0000 | ORAL_TABLET | Freq: Two times a day (BID) | ORAL | 0 refills | Status: DC

## 2022-02-26 MED ORDER — AMOXICILLIN-POT CLAVULANATE 875-125 MG PO TABS: 1.0000 | ORAL_TABLET | Freq: Two times a day (BID) | ORAL | 0 refills | Status: AC

## 2022-02-26 NOTE — Progress Notes (Signed)
Patient Active Problem List   Diagnosis Date Noted   Equinus contracture of left ankle 02/12/2022   Microcytic anemia 02/06/2022   Hypokalemia 02/06/2022   Abnormal surgical wound    Deformity of metatarsal bone of right foot    Open wound of right foot    Anxiety 05/22/2021   Diabetic foot infection ([place]) 08/27/2020   Spondylolisthesis of lumbar region 10/12/2019   Osteomyelitis due to type 2 diabetes mellitus ([place]) 06/16/2019   Spinal stenosis of lumbar region 03/10/2018   Esophageal ulcer without bleeding 08/26/2010   HTN (hypertension) 08/26/2010   Controlled IDDM-2 with hyperglycemia 08/25/2010   Chronic pain 08/25/2010    Patient's Medications  New Prescriptions   No medications on file  Previous Medications   ACETAMINOPHEN (TYLENOL) 500 MG TABLET    Take 2 tablets (1,000 mg total) by mouth every 6 (six) hours as needed for up to 20 doses (pain).   ALPRAZOLAM (XANAX) 0.5 MG TABLET    Take 0.5 mg by mouth 3 (three) times daily.   AMLODIPINE (NORVASC) 5 MG TABLET    Take 5 mg by mouth daily.   AMOXICILLIN-CLAVULANATE (AUGMENTIN) 875-125 MG TABLET    Take 1 tablet by mouth 2 (two) times daily for 12 days.   ASPIRIN EC 81 MG TABLET    Take 1 tablet (81 mg total) by mouth daily with breakfast.   ASPIRIN-ACETAMINOPHEN-CAFFEINE (EXCEDRIN MIGRAINE) 250-250-65 MG TABLET    Take 1 tablet by mouth every 6 (six) hours as needed for headache or migraine.   ATORVASTATIN (LIPITOR) 20 MG TABLET    Take 20 mg by mouth daily.   BUPROPION (WELLBUTRIN XL) 300 MG 24 HR TABLET    Take 300 mg by mouth daily.   DICLOFENAC SODIUM (VOLTAREN) 1 % GEL    Apply 2 g topically daily as needed (pain).   DILTIAZEM (TIAZAC) 420 MG 24 HR CAPSULE    Take 420 mg by mouth daily.   DULOXETINE (CYMBALTA) 60 MG CAPSULE    Take 60 mg by mouth daily.   FUROSEMIDE (LASIX) 20 MG TABLET    Take 20 mg by mouth daily.   GABAPENTIN (NEURONTIN) 800 MG TABLET    Take 800 mg by mouth 3 (three) times daily.   INSULIN  GLARGINE (LANTUS) 100 UNIT/ML INJECTION    Inject 80 Units into the skin daily.   LOSARTAN (COZAAR) 100 MG TABLET    Take 100 mg by mouth daily.   METFORMIN (GLUCOPHAGE) 500 MG TABLET    Take 1,000 mg by mouth in the morning and at bedtime. In the morning & 1300   MORPHINE (MSIR) 15 MG TABLET    Take 15 mg by mouth every 12 (twelve) hours.   MULTIPLE VITAMIN (MULTIVITAMIN WITH MINERALS) TABS TABLET    Take 1 tablet by mouth daily.   OXYCODONE (ROXICODONE) 15 MG IMMEDIATE RELEASE TABLET    Take 15 mg by mouth every 4 (four) hours.   PANTOPRAZOLE (PROTONIX) 40 MG TABLET    TAKE 1 TABLET(40 MG) BY MOUTH DAILY   POLYETHYLENE GLYCOL POWDER (GLYCOLAX/MIRALAX) 17 GM/SCOOP POWDER    Take 1 capful (17 g) by mouth daily as needed for mild constipation.   SITAGLIPTIN (JANUVIA) 100 MG TABLET    Take 100 mg by mouth daily.   XTAMPZA ER 9 MG C12A    Take 1 capsule by mouth 2 (two) times daily.  Modified Medications   No medications on file  Discontinued Medications   No  medications on file    Subjective: 62 y o male with PMH as below including RT TMA followed by revision in May 2023 who is here for HFU after recent hospital admission for DFI/osteomyelitis Taking augmentin as prescribed, denies missing doses. Denies any concerns with the antibiotics like nausea, vomiting, diarrhea or rashes. Denies fevers, chills. Very minimal drainage if any from the left TMA site. Seen by Podiatry Dr Posey Pronto 2/1 and wound thought to be healing as expected with no signs of worsening. Some of the staples were removed. No concerns otherwise.   Review of Systems: all systems reviewed with pertinent positives and negatives as listed above  Past Medical History:  Diagnosis Date   Acute biliary pancreatitis 10/23/2013   Acute osteomyelitis of right foot ([place]) 05/22/2021   AKI (acute kidney injury) ([place]) 05/22/2021   Arthritis    Biliary colic [id number]   Chronic osteomyelitis of toe of right foot ([place])    Diabetes mellitus     GERD (gastroesophageal reflux disease)    Headache(784.0)    History of kidney stones    Hypertension    Kidney stones    Neuropathy    Osteomyelitis of fourth toe of right foot ([place]) 06/16/2019   Past Surgical History:  Procedure Laterality Date   ABDOMINAL AORTOGRAM W/LOWER EXTREMITY N/A 02/10/2022   Procedure: ABDOMINAL AORTOGRAM W/LOWER EXTREMITY;  Surgeon: Broadus John, MD;  Location: [facility];  Service: Cardiovascular;  Laterality: N/A;   ACHILLES TENDON SURGERY Right 05/26/2021   Procedure: ACHILLES LENGTHENING/KIDNER;  Surgeon: Criselda Peaches, DPM;  Location: [facility];  Service: Podiatry;  Laterality: Right;   ACHILLES TENDON SURGERY Left 02/11/2022   Procedure: ACHILLES LENGTHENING/KIDNER;  Surgeon: Criselda Peaches, DPM;  Location: [place];  Service: Podiatry;  Laterality: Left;   AMPUTATION Left 02/11/2022   Procedure: LEFT TRANSMETATARSAL AMPUTATION;  Surgeon: Criselda Peaches, DPM;  Location: [facility];  Service: Podiatry;  Laterality: Left;   AMPUTATION TOE Right 06/21/2019   Procedure: AMPUTATION TOE;  Surgeon: Aviva Signs, MD;  Location: [facility];  Service: General;  Laterality: Right;   AMPUTATION TOE Right 08/29/2020   Procedure: RIGHT THIRD AMPUTATION TOE;  Surgeon: Aviva Signs, MD;  Location: [facility];  Service: General;  Laterality: Right;  third toe   AMPUTATION TOE Right 02/20/2021   Procedure: AMPUTATION  TRANSMATARSAL  RIGHT; GREAT TOE;  Surgeon: Aviva Signs, MD;  Location: [facility];  Service: General;  Laterality: Right;   AMPUTATION TOE Left 02/07/2022   Procedure: AMPUTATION TOE;  Surgeon: Criselda Peaches, DPM;  Location: [place] [facility];  Service: Podiatry;  Laterality: Left;  amputation of remainder of Left 4th toe   BACK SURGERY  2022   CARDIAC CATHETERIZATION  01/21/2007   CHOLECYSTECTOMY N/A 10/24/2013   Procedure: LAPAROSCOPIC CHOLECYSTECTOMY;  Surgeon: Jamesetta So, MD;  Location: [facility];  Service: General;  Laterality: N/A;   COLONOSCOPY N/A 03/31/2013    Procedure: COLONOSCOPY;  Surgeon: Rogene Houston, MD;  [facility];  Service: Endoscopy;  Laterality: N/A;  [place] Right 04/19/2021   Procedure: IRRIGATION AND DEBRIDEMENT RIGHT SECOND TOE;  Surgeon: Aviva Signs, MD;  Location: [facility];  Service: General;  Laterality: Right;   SECONDARY CLOSURE OF WOUND Right 04/19/2021   Procedure: IRRIGATION AND DEBRIDEMENT SECONDARY CLOSURE OF RIGHT GREAT TOE WOUND;  Surgeon: Aviva Signs, MD;  Location: [facility];  Service: General;  Laterality: Right;   TRANSMETATARSAL AMPUTATION Right 05/24/2021  Procedure: TRANSMETATARSAL AMPUTATION;  Surgeon: Criselda Peaches, DPM;  Location: [place];  Service: Podiatry;  Laterality: Right;   TRANSMETATARSAL AMPUTATION Right 05/26/2021   Procedure: TRANSMETATARSAL AMPUTATION Revision;  Surgeon: Criselda Peaches, DPM;  Location: [facility];  Service: Podiatry;  Laterality: Right;    Social History   Tobacco Use   Smoking status: Former    Packs/day: 1.00    Years: 7.00    Total pack years: 7.00    Types: Cigarettes    Quit date: 01/30/1982    Years since quitting: 40.1   Smokeless tobacco: Never  Vaping Use   Vaping Use: Never used  Substance Use Topics   Alcohol use: No   Drug use: No    Family History  Problem Relation Age of Onset   Diabetes Mother    Heart failure Mother    Heart disease Mother    Heart failure Father     Allergies  Allergen Reactions   Latex Rash and Other (See Comments)    Redness     Health Maintenance  Topic Date Due   Medicare Annual Wellness (AWV)  Never done   COVID-19 Vaccine (1) Never done   FOOT EXAM  Never done   OPHTHALMOLOGY EXAM  Never done   Diabetic kidney evaluation - Urine ACR  Never done   DTaP/Tdap/Td (1 - Tdap) Never done   Zoster Vaccines- Shingrix (1 of 2) Never done   INFLUENZA VACCINE  08/20/2021   HEMOGLOBIN A1C  11/22/2021   Diabetic kidney evaluation - eGFR measurement  02/15/2023   COLONOSCOPY (Pts 45-48yr  Insurance coverage will need to be confirmed)  04/01/2023   Hepatitis C Screening  Completed   HIV Screening  Completed   HPV VACCINES  Aged Out    Objective:  Vitals:   02/26/22 1505  BP: 130/71  Pulse: (!) 114  Temp: 98.3 F (36.8 C)  TempSrc: Oral  SpO2: 94%  Weight: 182 lb 6.4 oz (82.7 kg)   Body mass index is 26.94 kg/m.  Physical Exam Constitutional:      Appearance: Normal appearance.  HENT:     Head: Normocephalic and atraumatic.      Mouth: Mucous membranes are moist.  Eyes:    Conjunctiva/sclera: Conjunctivae normal.     Pupils: Pupils are equal, round, and bilaterally symmetrical   Cardiovascular:     Rate and Rhythm: Normal rate and regular rhythm.     Heart sounds:   Pulmonary:     Effort: Pulmonary effort is normal.     Breath sounds: Normal breath sounds.   Abdominal:     General: Non distended     Palpations: soft.   Musculoskeletal:        General: Normal range of motion.   Left TMA site - staples in place, not completely closed. No active drainage, No tenderness or fluctuance or crepitus, Left ankle ROM intact.     Skin:    General: Skin is warm and dry.     Comments:  Neurological:     General: grossly non focal     Mental Status: awake, alert and oriented to person, place, and time.   Psychiatric:        Mood and Affect: Mood normal.   Lab Results Lab Results  Component Value Date   WBC 10.5 02/14/2022   HGB 8.3 (L) 02/14/2022   HCT 27.2 (L) 02/14/2022   MCV 80.7 02/14/2022   PLT 435 (H) 02/14/2022    Lab Results  Component Value Date   CREATININE 0.79 02/14/2022   BUN 14 02/14/2022   NA 138 02/14/2022   K 3.8 02/14/2022   CL 105 02/14/2022   CO2 25 02/14/2022    Lab Results  Component Value Date   ALT 32 02/14/2022   AST 26 02/14/2022   ALKPHOS 97 02/14/2022   BILITOT <0.1 (L) 02/14/2022    Lab Results  Component Value Date   CHOL 84 02/11/2022   HDL 24 (L) 02/11/2022   LDLCALC 40 02/11/2022   TRIG 102  02/11/2022   CHOLHDL 3.5 02/11/2022   No results found for: "LABRPR", "RPRTITER" No results found for: "HIV1RNAQUANT", "HIV1RNAVL", "CD4TABS"  Microbiology Results for orders placed or performed during the hospital encounter of 02/06/22  Blood culture (routine x 2)     Status: None   Collection Time: 02/06/22 11:25 AM   Specimen: BLOOD  Result Value Ref Range Status   Specimen Description BLOOD BLOOD RIGHT FOREARM  Final   Special Requests   Final    BOTTLES DRAWN AEROBIC AND ANAEROBIC Blood Culture adequate volume   Culture   Final    NO GROWTH 5 DAYS Performed at [facility], [address] [place], [place] [address]    Report Status 02/11/2022 FINAL  Final  Blood culture (routine x 2)     Status: None   Collection Time: 02/06/22 11:27 AM   Specimen: BLOOD  Result Value Ref Range Status   Specimen Description BLOOD BLOOD LEFT FOREARM  Final   Special Requests   Final    BOTTLES DRAWN AEROBIC AND ANAEROBIC Blood Culture adequate volume   Culture   Final    NO GROWTH 5 DAYS Performed at [facility], [place] [address], [place], [place] [address]    Report Status 02/11/2022 FINAL  Final  Surgical pcr screen     Status: None   Collection Time: 02/07/22  3:07 PM  Result Value Ref Range Status   MRSA, PCR NEGATIVE NEGATIVE Final   Staphylococcus aureus NEGATIVE NEGATIVE Final    Comment: (NOTE) The Xpert SA Assay (FDA approved for NASAL specimens in patients 23 years of age and older), is one component of a comprehensive surveillance program. It is not intended to diagnose infection nor to guide or monitor treatment. Performed at [facility], [place] [address], [place], [place] [address]   Aerobic/Anaerobic Culture w Gram Stain (surgical/deep wound)     Status: None   Collection Time: 02/07/22  6:06 PM   Specimen: Soft Tissue, Other  Result Value Ref Range Status   Specimen Description TISSUE LEFT FOOT  Final   Special Requests SWAB OF DEEP TISSUE PT ON  DACTOMYCIN  Final   Gram Stain   Final    RARE WBC PRESENT, PREDOMINANTLY PMN MODERATE GRAM POSITIVE RODS RARE GRAM POSITIVE COCCI IN PAIRS    Culture   Final    FEW ESCHERICHIA COLI MODERATE PREVOTELLA BIVIA BETA LACTAMASE POSITIVE RARE PROPIONIBACTERIUM SPECIES Standardized susceptibility testing for this organism is not available. Performed at [facility], [place] [address] [place], [place] [address]    Report Status 02/16/2022 FINAL  Final   Organism ID, Bacteria ESCHERICHIA COLI  Final      Susceptibility   Escherichia coli - MIC*    AMPICILLIN 4 SENSITIVE Sensitive     CEFAZOLIN <=4 SENSITIVE Sensitive     CEFEPIME <=0.12 SENSITIVE Sensitive     CEFTAZIDIME <=1 SENSITIVE Sensitive  CEFTRIAXONE <=0.25 SENSITIVE Sensitive     CIPROFLOXACIN <=0.25 SENSITIVE Sensitive     GENTAMICIN <=1 SENSITIVE Sensitive     IMIPENEM <=0.25 SENSITIVE Sensitive     TRIMETH/SULFA <=20 SENSITIVE Sensitive     AMPICILLIN/SULBACTAM <=2 SENSITIVE Sensitive     PIP/TAZO <=4 SENSITIVE Sensitive     * FEW ESCHERICHIA COLI   Pathology  FINAL MICROSCOPIC DIAGNOSIS:   A. FOOT, LEFT FOREFOOT, AMPUTATION:  -  Skin and soft tissue with ulceration and abscess formation with  underlying bone with focal acute osteomyelitis.    Assessment/Plan 44 y o male with PMH as below including RT TMA followed by revision in May 2023  with    # Surgical site infection of left foot s/p left partial 4th digit amputation 1/4/Diabetic Foot Infection/osteomyelitis 1/19 s/p amputation of the left 4th toe at the MPJ with I and D of abscess, tendon sheath and wide debridement of multiple fascial planes. " Unfortunately the abscess continued to extend further proximally with a tension and necrosis of the dorsal and plantar soft tissue surrounding the metatarsal. The metatarsal itself appeared to be healthy" OR cx with E coli, prevotella bivia, proprionibacterium spp   1/22 S/p angiogram : brisk, single-vessel   peroneal outflow to the feet bilaterally. There is diffuse collateralization in the left foot    1/23 s/p Left TMA and achilles tendon lengthening and thought to be source control per Podiatry. Path  + for osteomyelitis ( did not specify margin for osteomyelitis)  Plan Left TMA site has not completely healed. Would complete 6 weeks of augmentin from 1/23. EOT 03/25/22 Fu in 3-4 weeks  Fu with podiatry as instructed  Discussed BG control   I have personally spent more than 70 minutes involved in face-to-face and non-face-to-face activities for this patient on the day of the visit. Professional time spent includes the following activities: Preparing to see the patient (review of tests), Obtaining and/or reviewing separately obtained history (admission/discharge record), Performing a medically appropriate examination and/or evaluation , Ordering medications/tests/procedures, referring and communicating with other health care professionals, Documenting clinical information in the EMR, Independently interpreting results (not separately reported), Communicating results to the patient/family/caregiver, Counseling and educating the patient/family/caregiver and Care coordination (not separately reported).   Wilber Oliphant, [place] 02/26/2022, 3:16 PM

## 2022-02-27 DIAGNOSIS — T819XXA Unspecified complication of procedure, initial encounter: Secondary | ICD-10-CM | POA: Diagnosis not present

## 2022-02-27 DIAGNOSIS — S91301A Unspecified open wound, right foot, initial encounter: Secondary | ICD-10-CM | POA: Diagnosis not present

## 2022-02-27 DIAGNOSIS — M21961 Unspecified acquired deformity of right lower leg: Secondary | ICD-10-CM | POA: Diagnosis not present

## 2022-02-27 LAB — CBC
HCT: 34.6 % — ABNORMAL LOW (ref 38.5–50.0)
Hemoglobin: 10.8 g/dL — ABNORMAL LOW (ref 13.2–17.1)
MCH: 24.1 pg — ABNORMAL LOW (ref 27.0–33.0)
MCHC: 31.2 g/dL — ABNORMAL LOW (ref 32.0–36.0)
MCV: 77.2 fL — ABNORMAL LOW (ref 80.0–100.0)
MPV: 9.2 fL (ref 7.5–12.5)
Platelets: 380 10*3/uL (ref 140–400)
RBC: 4.48 10*6/uL (ref 4.20–5.80)
RDW: 16 % — ABNORMAL HIGH (ref 11.0–15.0)
WBC: 9 10*3/uL (ref 3.8–10.8)

## 2022-02-27 LAB — COMPREHENSIVE METABOLIC PANEL
AG Ratio: 1.4 (calc) (ref 1.0–2.5)
ALT: 13 U/L (ref 9–46)
AST: 13 U/L (ref 10–35)
Albumin: 4.2 g/dL (ref 3.6–5.1)
Alkaline phosphatase (APISO): 95 U/L (ref 35–144)
BUN: 15 mg/dL (ref 7–25)
CO2: 26 mmol/L (ref 20–32)
Calcium: 9.3 mg/dL (ref 8.6–10.3)
Chloride: 106 mmol/L (ref 98–110)
Creat: 1.03 mg/dL (ref 0.70–1.35)
Globulin: 2.9 g/dL (calc) (ref 1.9–3.7)
Glucose, Bld: 99 mg/dL (ref 65–99)
Potassium: 3.7 mmol/L (ref 3.5–5.3)
Sodium: 144 mmol/L (ref 135–146)
Total Bilirubin: 0.3 mg/dL (ref 0.2–1.2)
Total Protein: 7.1 g/dL (ref 6.1–8.1)

## 2022-02-27 LAB — SEDIMENTATION RATE: Sed Rate: 29 mm/h — ABNORMAL HIGH (ref 0–20)

## 2022-02-27 LAB — C-REACTIVE PROTEIN: CRP: 1.2 mg/L (ref <8.0)

## 2022-02-27 NOTE — Telephone Encounter (Signed)
No further information needed 

## 2022-02-28 DIAGNOSIS — Z5181 Encounter for therapeutic drug level monitoring: Secondary | ICD-10-CM | POA: Insufficient documentation

## 2022-03-02 NOTE — Progress Notes (Signed)
  Subjective:  Patient ID: [name], male    DOB: 12-11-1960,  MRN: [id number]  Chief Complaint  Patient presents with   Routine Post Op    POV #1 DOS 02/11/2022 LT TMA / TAL       62 y.o. male returns for post-op check.   Review of Systems: Negative except as noted in the HPI. Denies N/V/F/Ch.   Objective:  There were no vitals filed for this visit. There is no height or weight on file to calculate BMI. Constitutional Well developed. Well nourished.  Vascular Foot warm and well perfused. Capillary refill normal to all digits.  Calf is soft and supple, no posterior calf or knee pain, negative Homans' sign  Neurologic Normal speech. Oriented to person, place, and time. Epicritic sensation to light touch grossly absent bilaterally.  Dermatologic Skin healing well without signs of infection. Skin edges well coapted without signs of infection.  Central area of delayed healing small no signs of infection  Orthopedic: Tenderness to palpation noted about the surgical site.    Assessment:   1. Status post transmetatarsal amputation of foot, left ([place])    Plan:  Patient was evaluated and treated and all questions answered.  S/p foot surgery left -Progressing as expected post-operatively. -WB Status: NWB in Multi-Podus splint -Sutures: Return 2 weeks for removal. -Medications: Refill Augmentin sent to [facility] redressed.  Change dressing every other day with Betadine  Return in about 2 weeks (around 03/11/2022) for wound care, post op (no x-rays), casting for toe filler for L .

## 2022-03-04 DIAGNOSIS — E1129 Type 2 diabetes mellitus with other diabetic kidney complication: Secondary | ICD-10-CM | POA: Diagnosis not present

## 2022-03-04 DIAGNOSIS — E1142 Type 2 diabetes mellitus with diabetic polyneuropathy: Secondary | ICD-10-CM | POA: Diagnosis not present

## 2022-03-04 DIAGNOSIS — M86212 Subacute osteomyelitis, left shoulder: Secondary | ICD-10-CM | POA: Diagnosis not present

## 2022-03-04 DIAGNOSIS — M4306 Spondylolysis, lumbar region: Secondary | ICD-10-CM | POA: Diagnosis not present

## 2022-03-04 DIAGNOSIS — E11628 Type 2 diabetes mellitus with other skin complications: Secondary | ICD-10-CM | POA: Diagnosis not present

## 2022-03-04 DIAGNOSIS — M869 Osteomyelitis, unspecified: Secondary | ICD-10-CM | POA: Diagnosis not present

## 2022-03-04 DIAGNOSIS — I700 Atherosclerosis of aorta: Secondary | ICD-10-CM | POA: Diagnosis not present

## 2022-03-06 ENCOUNTER — Encounter: Payer: Medicare Other | Admitting: Podiatry

## 2022-03-12 ENCOUNTER — Ambulatory Visit: Payer: 59 | Admitting: Podiatry

## 2022-03-13 ENCOUNTER — Ambulatory Visit (INDEPENDENT_AMBULATORY_CARE_PROVIDER_SITE_OTHER): Payer: 59 | Admitting: Podiatry

## 2022-03-13 DIAGNOSIS — T8149XA Infection following a procedure, other surgical site, initial encounter: Secondary | ICD-10-CM | POA: Diagnosis not present

## 2022-03-13 DIAGNOSIS — Z89432 Acquired absence of left foot: Secondary | ICD-10-CM

## 2022-03-13 DIAGNOSIS — L97522 Non-pressure chronic ulcer of other part of left foot with fat layer exposed: Secondary | ICD-10-CM

## 2022-03-13 MED ORDER — SULFAMETHOXAZOLE-TRIMETHOPRIM 800-160 MG PO TABS: 1.0000 | ORAL_TABLET | Freq: Two times a day (BID) | ORAL | 0 refills | Status: DC

## 2022-03-16 ENCOUNTER — Encounter: Payer: Self-pay | Admitting: Podiatry

## 2022-03-16 NOTE — Progress Notes (Signed)
  Subjective:  Patient ID: [name], male    DOB: 04-Aug-1960,  MRN: [id number]  Chief Complaint  Patient presents with   Foot Ulcer    wound care, post op (no x-rays), casting for toe filler for L       62 y.o. male returns for post-op check.   Review of Systems: Negative except as noted in the HPI. Denies N/V/F/Ch.   Objective:  There were no vitals filed for this visit. There is no height or weight on file to calculate BMI. Constitutional Well developed. Well nourished.  Vascular Foot warm and well perfused. Capillary refill normal to all digits.  Calf is soft and supple, no posterior calf or knee pain, negative Homans' sign  Neurologic Normal speech. Oriented to person, place, and time. Epicritic sensation to light touch grossly absent bilaterally.  Dermatologic Central area of dehiscence noted, does probe deep to subcutaneous layer, some serous drainage no cellulitis no purulence or malodor  Orthopedic: Tenderness to palpation noted about the surgical site.    Assessment:   1. Status post transmetatarsal amputation of foot, left ([place])   2. Ulcer of left foot with fat layer exposed ([place])    Plan:  Patient was evaluated and treated and all questions answered.  S/p foot surgery left -Has some central dehiscence.  Culture of the drainage was taken of the deep tissue.  Rx for Bactrim sent to pharmacy.  They will pack with iodinated packing change daily.  Plan to follow-up in 1 week for wound care.  Return in about 1 week (around 03/20/2022) for wound care.

## 2022-03-17 LAB — WOUND CULTURE
MICRO NUMBER:: 14601362
RESULT:: NO GROWTH
SPECIMEN QUALITY:: ADEQUATE

## 2022-03-20 ENCOUNTER — Emergency Department (HOSPITAL_COMMUNITY)
Admission: EM | Admit: 2022-03-20 | Discharge: 2022-03-20 | Disposition: A | Discharge status: Home / Self Care | Payer: 59 | Attending: Emergency Medicine | Admitting: Emergency Medicine

## 2022-03-20 ENCOUNTER — Other Ambulatory Visit: Payer: Self-pay

## 2022-03-20 ENCOUNTER — Encounter (HOSPITAL_COMMUNITY): Payer: Self-pay

## 2022-03-20 ENCOUNTER — Emergency Department (HOSPITAL_COMMUNITY): Payer: 59

## 2022-03-20 ENCOUNTER — Ambulatory Visit: Payer: 59 | Admitting: Infectious Diseases

## 2022-03-20 ENCOUNTER — Ambulatory Visit (INDEPENDENT_AMBULATORY_CARE_PROVIDER_SITE_OTHER): Payer: 59 | Admitting: Podiatry

## 2022-03-20 ENCOUNTER — Telehealth: Payer: Self-pay | Admitting: *Deleted

## 2022-03-20 DIAGNOSIS — Z7984 Long term (current) use of oral hypoglycemic drugs: Secondary | ICD-10-CM | POA: Diagnosis not present

## 2022-03-20 DIAGNOSIS — I10 Essential (primary) hypertension: Secondary | ICD-10-CM | POA: Diagnosis not present

## 2022-03-20 DIAGNOSIS — Z794 Long term (current) use of insulin: Secondary | ICD-10-CM | POA: Insufficient documentation

## 2022-03-20 DIAGNOSIS — R531 Weakness: Secondary | ICD-10-CM | POA: Diagnosis not present

## 2022-03-20 DIAGNOSIS — Z91040 Latex allergy status: Secondary | ICD-10-CM | POA: Insufficient documentation

## 2022-03-20 DIAGNOSIS — M25511 Pain in right shoulder: Secondary | ICD-10-CM | POA: Diagnosis not present

## 2022-03-20 DIAGNOSIS — R296 Repeated falls: Secondary | ICD-10-CM | POA: Insufficient documentation

## 2022-03-20 DIAGNOSIS — Z1152 Encounter for screening for COVID-19: Secondary | ICD-10-CM | POA: Diagnosis not present

## 2022-03-20 DIAGNOSIS — E119 Type 2 diabetes mellitus without complications: Secondary | ICD-10-CM | POA: Diagnosis not present

## 2022-03-20 DIAGNOSIS — Z7982 Long term (current) use of aspirin: Secondary | ICD-10-CM | POA: Insufficient documentation

## 2022-03-20 DIAGNOSIS — Z79899 Other long term (current) drug therapy: Secondary | ICD-10-CM | POA: Insufficient documentation

## 2022-03-20 DIAGNOSIS — M7989 Other specified soft tissue disorders: Secondary | ICD-10-CM | POA: Diagnosis not present

## 2022-03-20 DIAGNOSIS — R600 Localized edema: Secondary | ICD-10-CM | POA: Diagnosis not present

## 2022-03-20 DIAGNOSIS — L97522 Non-pressure chronic ulcer of other part of left foot with fat layer exposed: Secondary | ICD-10-CM

## 2022-03-20 DIAGNOSIS — M79662 Pain in left lower leg: Secondary | ICD-10-CM | POA: Diagnosis not present

## 2022-03-20 DIAGNOSIS — T8132XA Disruption of internal operation (surgical) wound, not elsewhere classified, initial encounter: Secondary | ICD-10-CM

## 2022-03-20 DIAGNOSIS — G459 Transient cerebral ischemic attack, unspecified: Secondary | ICD-10-CM | POA: Diagnosis not present

## 2022-03-20 LAB — URINALYSIS, W/ REFLEX TO CULTURE (INFECTION SUSPECTED)
Bilirubin Urine: NEGATIVE
Glucose, UA: NEGATIVE mg/dL
Hgb urine dipstick: NEGATIVE
Ketones, ur: NEGATIVE mg/dL
Leukocytes,Ua: NEGATIVE
Nitrite: NEGATIVE
Protein, ur: 30 mg/dL — AB
Specific Gravity, Urine: 1.018 (ref 1.005–1.030)
pH: 5 (ref 5.0–8.0)

## 2022-03-20 LAB — COMPREHENSIVE METABOLIC PANEL
ALT: 18 U/L (ref 0–44)
AST: 27 U/L (ref 15–41)
Albumin: 3.9 g/dL (ref 3.5–5.0)
Alkaline Phosphatase: 84 U/L (ref 38–126)
Anion gap: 10 (ref 5–15)
BUN: 18 mg/dL (ref 6–20)
CO2: 22 mmol/L (ref 22–32)
Calcium: 8.9 mg/dL (ref 8.9–10.3)
Chloride: 107 mmol/L (ref 98–111)
Creatinine, Ser: 1.33 mg/dL — ABNORMAL HIGH (ref 0.61–1.24)
GFR, Estimated: >60 mL/min (ref >60)
Glucose, Bld: 65 mg/dL — ABNORMAL LOW (ref 70–99)
Potassium: 3.6 mmol/L (ref 3.5–5.1)
Sodium: 139 mmol/L (ref 135–145)
Total Bilirubin: <0.1 mg/dL — ABNORMAL LOW (ref 0.3–1.2)
Total Protein: 7.2 g/dL (ref 6.5–8.1)

## 2022-03-20 LAB — CBC WITH DIFFERENTIAL/PLATELET
Abs Immature Granulocytes: 0.02 10*3/uL (ref 0.00–0.07)
Basophils Absolute: 0.1 10*3/uL (ref 0.0–0.1)
Basophils Relative: 1 %
Eosinophils Absolute: 0.4 10*3/uL (ref 0.0–0.5)
Eosinophils Relative: 4 %
HCT: 34.5 % — ABNORMAL LOW (ref 39.0–52.0)
Hemoglobin: 10.6 g/dL — ABNORMAL LOW (ref 13.0–17.0)
Immature Granulocytes: 0 %
Lymphocytes Relative: 37 %
Lymphs Abs: 3.8 10*3/uL (ref 0.7–4.0)
MCH: 23.9 pg — ABNORMAL LOW (ref 26.0–34.0)
MCHC: 30.7 g/dL (ref 30.0–36.0)
MCV: 77.7 fL — ABNORMAL LOW (ref 80.0–100.0)
Monocytes Absolute: 0.7 10*3/uL (ref 0.1–1.0)
Monocytes Relative: 7 %
Neutro Abs: 5.3 10*3/uL (ref 1.7–7.7)
Neutrophils Relative %: 51 %
Platelets: 282 10*3/uL (ref 150–400)
RBC: 4.44 MIL/uL (ref 4.22–5.81)
RDW: 16.4 % — ABNORMAL HIGH (ref 11.5–15.5)
WBC: 10.3 10*3/uL (ref 4.0–10.5)
nRBC: 0 % (ref 0.0–0.2)

## 2022-03-20 LAB — LACTIC ACID, PLASMA: Lactic Acid, Venous: 1.9 mmol/L (ref 0.5–1.9)

## 2022-03-20 LAB — RESP PANEL BY RT-PCR (RSV, FLU A&B, COVID)  RVPGX2
Influenza A by PCR: NEGATIVE
Influenza B by PCR: NEGATIVE
Resp Syncytial Virus by PCR: NEGATIVE
SARS Coronavirus 2 by RT PCR: NEGATIVE

## 2022-03-20 LAB — CBG MONITORING, ED: Glucose-Capillary: 81 mg/dL (ref 70–99)

## 2022-03-20 LAB — TROPONIN I (HIGH SENSITIVITY)
Troponin I (High Sensitivity): 7 ng/L (ref <18)
Troponin I (High Sensitivity): 7 ng/L (ref <18)

## 2022-03-20 LAB — APTT: aPTT: 26 seconds (ref 24–36)

## 2022-03-20 LAB — PROTIME-INR
INR: 1 (ref 0.8–1.2)
Prothrombin Time: 13.2 seconds (ref 11.4–15.2)

## 2022-03-20 MED ORDER — OXYCODONE-ACETAMINOPHEN 5-325 MG PO TABS
1.0000 | ORAL_TABLET | Freq: Once | ORAL | Status: AC
  Administered 2022-03-20: 1 via ORAL
  Filled 2022-03-20: qty 1

## 2022-03-20 NOTE — Discharge Instructions (Addendum)
Your workup today was reassuring.  MRI brain did not show evidence of stroke.  No blood no pneumonia.  Fracture or other concerns in the right shoulder.  Your blood work did not show evidence of infection.  Urine did not show evidence of infection.  You were also seen by the hospitalist.  He recommends that you cut back on your home pain medication by 50%.  He recommends that you stay in the wheelchair and always have family members around you given your increased risk of falling.  For any concerning symptoms return to the emergency department.  Follow-up with your primary care provider.

## 2022-03-20 NOTE — ED Notes (Signed)
Pt left without discharge papers and vitals were not able to be obtained.

## 2022-03-20 NOTE — ED Notes (Signed)
Pt requesting to leave. Informed EDP and told pt EDP would come speak with them before they leave; pt verbalized understanding

## 2022-03-20 NOTE — ED Triage Notes (Signed)
Pt complaining of being weak and fell yesterday. This am he fell 2 times, said his speech is not normal for him this am. He said he noticed it around 10 this am.

## 2022-03-20 NOTE — ED Provider Notes (Signed)
[name] Provider Note   CSN: [id number] Arrival date & time: 03/20/22  1351     History  Chief Complaint  Patient presents with   Weakness    [name] is a 62 y.o. male.  62 year old male with for evaluation of weakness, recurrent falls, right shoulder that started this morning.  He states the weakness in his legs started last night.  He states he has had about 4 falls since last night.  He states his legs feel like "Jello".  Despite his recent toe amputation he feels that this is not normal since his surgery.  His daughter who is at bedside states his speech is also not at his baseline.  She states she last saw him yesterday evening and he was at his baseline.  Although his fall started last night, he noticed his speech change, and other symptoms around 9 or 10 this morning.  Patient also endorses difficulty finding words.  He states this has been ongoing for some time but daughter states that this is new as of today.  The history is provided by the patient. No language interpreter was used.       Home Medications Prior to Admission medications   Medication Sig Start Date End Date Taking? Authorizing Provider  acetaminophen (TYLENOL) 500 MG tablet Take 2 tablets (1,000 mg total) by mouth every 6 (six) hours as needed for up to 20 doses (pain). 02/14/22   Raiford Noble Latif, DO  ALPRAZolam Duanne Moron) 0.5 MG tablet Take 0.5 mg by mouth 3 (three) times daily. 02/25/15   [provider]  amLODipine (NORVASC) 5 MG tablet Take 5 mg by mouth daily.    [provider]  amoxicillin-clavulanate (AUGMENTIN) 875-125 MG tablet Take 1 tablet by mouth 2 (two) times daily for 28 days. 02/26/22 03/26/22  Rosiland Oz, MD  aspirin EC 81 MG tablet Take 1 tablet (81 mg total) by mouth daily with breakfast. 06/22/19   Roxan Hockey, MD  aspirin-acetaminophen-caffeine (EXCEDRIN MIGRAINE) [phone] MG tablet Take 1 tablet by mouth  every 6 (six) hours as needed for headache or migraine.    [provider]  atorvastatin (LIPITOR) 20 MG tablet Take 20 mg by mouth daily. 03/31/19   [provider]  buPROPion (WELLBUTRIN XL) 300 MG 24 hr tablet Take 300 mg by mouth daily. 08/06/20   [provider]  diclofenac Sodium (VOLTAREN) 1 % GEL Apply 2 g topically daily as needed (pain). 03/14/21   [provider]  diltiazem (TIAZAC) 420 MG 24 hr capsule Take 420 mg by mouth daily. 02/20/19   [provider]  DULoxetine (CYMBALTA) 60 MG capsule Take 60 mg by mouth daily.    [provider]  furosemide (LASIX) 20 MG tablet Take 20 mg by mouth daily. 11/25/19   [provider]  gabapentin (NEURONTIN) 800 MG tablet Take 800 mg by mouth 3 (three) times daily. 08/29/19   [provider]  insulin glargine (LANTUS) 100 UNIT/ML injection Inject 80 Units into the skin daily.    [provider]  losartan (COZAAR) 100 MG tablet Take 100 mg by mouth daily. 05/10/21   [provider]  metFORMIN (GLUCOPHAGE) 500 MG tablet Take 1,000 mg by mouth in the morning and at bedtime. In the morning & 1300    [provider]  morphine (MSIR) 15 MG tablet Take 15 mg by mouth every 12 (twelve) hours.    [provider]  Multiple Vitamin (  MULTIVITAMIN WITH MINERALS) TABS tablet Take 1 tablet by mouth daily.    [provider]  oxyCODONE (ROXICODONE) 15 MG immediate release tablet Take 15 mg by mouth every 4 (four) hours. 02/11/22   [provider]  pantoprazole (PROTONIX) 40 MG tablet TAKE 1 TABLET(40 MG) BY MOUTH DAILY Patient taking differently: Take 40 mg by mouth daily. 04/30/20   Arnoldo Lenis, MD  polyethylene glycol powder (GLYCOLAX/MIRALAX) 17 GM/SCOOP powder Take 1 capful (17 g) by mouth daily as needed for mild constipation. 02/14/22   Sheikh, Georgina Quint Latif, DO  sitaGLIPtin (JANUVIA) 100 MG tablet Take 100 mg by mouth daily.    [provider]  sulfamethoxazole-trimethoprim (BACTRIM DS) 800-160 MG tablet Take 1 tablet by mouth 2 (two) times daily. 03/13/22   Criselda Peaches, DPM  XTAMPZA ER 9 MG C12A Take 1 capsule by mouth 2 (two) times daily. 02/12/22   [provider]      Allergies    Latex    Review of Systems   Review of Systems  Constitutional:  Negative for chills and fever.  Eyes:  Negative for visual disturbance.  Respiratory:  Negative for shortness of breath.   Cardiovascular:  Negative for chest pain.  Gastrointestinal:  Negative for abdominal pain, nausea and vomiting.  Genitourinary:  Negative for dysuria.  Neurological:  Positive for speech difficulty and weakness. Negative for light-headedness.  All other systems reviewed and are negative.   Physical Exam Updated Vital Signs BP (!) 154/80   Pulse (!) 113   Temp 98.3 F (36.8 C) (Oral)   Resp 20   Ht '5\' 9"'$  (1.753 m)   Wt 84.4 kg   SpO2 98%   BMI 27.47 kg/m  Physical Exam Vitals and nursing note reviewed.  Constitutional:      General: He is not in acute distress.    Appearance: Normal appearance. He is not ill-appearing.  HENT:     Head: Normocephalic and atraumatic.     Nose: Nose normal.  Eyes:     General: No scleral icterus.    Extraocular Movements: Extraocular movements intact.     Conjunctiva/sclera: Conjunctivae normal.  Cardiovascular:     Rate and Rhythm: Normal rate and regular rhythm.     Pulses: Normal pulses.  Pulmonary:     Effort: Pulmonary effort is normal. No respiratory distress.     Breath sounds: Normal breath sounds. No wheezing or rales.  Abdominal:     General: There is no distension.     Tenderness: There is no abdominal tenderness.  Musculoskeletal:        General: Normal range of motion.     Cervical back: Normal range of motion.     Right lower leg: No edema.     Left lower leg: Edema present.  Skin:    General: Skin is warm and dry.  Neurological:     General: No focal deficit  present.     Mental Status: He is alert. Mental status is at baseline.     ED Results / Procedures / Treatments   Labs (all labs ordered are listed, but only abnormal results are displayed) Labs Reviewed  RESP PANEL BY RT-PCR (RSV, FLU A&B, COVID)  RVPGX2  LACTIC ACID, PLASMA  LACTIC ACID, PLASMA  COMPREHENSIVE METABOLIC PANEL  CBC WITH DIFFERENTIAL/PLATELET  PROTIME-INR  APTT  URINALYSIS, W/ REFLEX TO CULTURE (INFECTION SUSPECTED)  TROPONIN I (HIGH SENSITIVITY)    EKG None  Radiology No results found.  Procedures  Procedures    Medications Ordered in ED Medications - No data to display  ED Course/ Medical Decision Making/ A&P                             Medical Decision Making Amount and/or Complexity of Data Reviewed Labs: ordered. Radiology: ordered. ECG/medicine tests: ordered.  Risk Prescription drug management.   Medical Decision Making / ED Course   This patient presents to the ED for concern of weakness, falls, speech change, this involves an extensive number of treatment options, and is a complaint that carries with it a high risk of complications and morbidity.  The differential diagnosis includes CVA, infection, intracranial injury  MDM: 62 year old male with past medical history as noted above presents today for evaluation of recurrent falls, speech change, weakness.  Last known normal yesterday evening.  On exam he does have noticeable weakness to the right upper extremity.  Right lower extremity with good range of motion and 5/5 strength.  Without facial droop.  He does report difficulty finding words.   CBC is without leukocytosis.  Hemoglobin above patient's baseline.  CMP shows mild renal insufficiency otherwise without acute findings.  Without lactic acidosis.  COVID, flu, RSV negative.  Troponin negative x 2.  UA without evidence of UTI.  MRI brain without evidence of CVA, or other acute intracranial finding.  Chest x-ray without evidence of  pneumonia or other acute cardiopulmonary process.  Shoulder x-ray without bony abnormality.  DVT study negative.  Given the speech change, and the weakness will discuss with hospitalist for potential admission.  Evaluated by hospitalist.  He recommends patient be discharged with reduction in his home pain medication by 50%.  He discussed this with patient.  Recommends patient stay with his as this is the source of his unsteadiness.  He has home PT and OT ordered for next week.  Lab Tests: -I ordered, reviewed, and interpreted labs.   The pertinent results include:   Labs Reviewed  RESP PANEL BY RT-PCR (RSV, FLU A&B, COVID)  RVPGX2  LACTIC ACID, PLASMA  LACTIC ACID, PLASMA  COMPREHENSIVE METABOLIC PANEL  CBC WITH DIFFERENTIAL/PLATELET  PROTIME-INR  APTT  URINALYSIS, W/ REFLEX TO CULTURE (INFECTION SUSPECTED)  TROPONIN I (HIGH SENSITIVITY)      EKG  EKG Interpretation  Date/Time:    Ventricular Rate:    PR Interval:    QRS Duration:   QT Interval:    QTC Calculation:   R Axis:     Text Interpretation:           Imaging Studies ordered: I ordered imaging studies including MRI brain, chest x-ray, right shoulder x-ray, DVT study I independently visualized and interpreted imaging. I agree with the radiologist interpretation   Medicines ordered and prescription drug management: No orders of the defined types were placed in this encounter.   -I have reviewed the patients home medicines and have made adjustments as needed  Reevaluation: After the interventions noted above, I reevaluated the patient and found that they have :stayed the same  Co morbidities that complicate the patient evaluation  Past Medical History:  Diagnosis Date   Acute biliary pancreatitis 10/23/2013   Acute osteomyelitis of right foot ([place]) 05/22/2021   AKI (acute kidney injury) ([place]) 05/22/2021   Arthritis    Biliary colic [id number]   Chronic osteomyelitis of toe of right foot ([place])     Diabetes mellitus    GERD (gastroesophageal reflux disease)  Headache(784.0)    History of kidney stones    Hypertension    Kidney stones    Neuropathy    Osteomyelitis of fourth toe of right foot ([place]) 06/16/2019      Dispostion: Patient discharged in stable condition.  Evaluated by hospitalist who will place a consult note recommending that patient is okay for discharge.  He has PT and OT scheduled for next week.  Plan discussed extensively with daughter who is in agreement with plan.  She is aware he needs to cut back on his pain medication by 50%.  She is aware he is not to be left alone because he is at increased risk for fall.  Final Clinical Impression(s) / ED Diagnoses Final diagnoses:  Weakness    Rx / DC Orders ED Discharge Orders     None         Evlyn Courier, PA-C 03/20/22 1903    Davonna Belling, MD 03/20/22 2256

## 2022-03-20 NOTE — ED Notes (Signed)
Patient transported to MRI 

## 2022-03-20 NOTE — ED Notes (Signed)
Pt remains in MRI at this time  

## 2022-03-20 NOTE — Telephone Encounter (Signed)
Home health referral sent to [facility], confirmation received, [facility] only exist for oxygen medical supplies.

## 2022-03-20 NOTE — Progress Notes (Signed)
  Subjective:  Patient ID: [name], male    DOB: [birth year],  MRN: [id number]  Chief Complaint  Patient presents with   Foot Ulcer    1 week wound care left       62 y.o. male returns for post-op check.  Says it is doing a bit better they are packing daily  Review of Systems: Negative except as noted in the HPI. Denies N/V/F/Ch.   Objective:  There were no vitals filed for this visit. There is no height or weight on file to calculate BMI. Constitutional Well developed. Well nourished.  Vascular Foot warm and well perfused. Capillary refill normal to all digits.  Calf is soft and supple, no posterior calf or knee pain, negative Homans' sign  Neurologic Normal speech. Oriented to person, place, and time. Epicritic sensation to light touch grossly absent bilaterally.  Dermatologic Central area of deep dehiscence with residual ulceration noted measuring 2.0 x 1.0 x 1.5 cm, serous drainage exposed subcutaneous tissue with fibrogranular wound bed surrounding hyperkeratosis no signs of infection  Orthopedic: He has some sensitivity and tenderness to palpation noted about the surgical site.    Assessment:   1. Ulcer of left foot with fat layer exposed ([place])   2. Deep disruption or dehiscence of operation wound, initial encounter    Plan:  Patient was evaluated and treated and all questions answered.  S/p foot surgery left -Improved with antibiotics and packing.  He does have a deep dehiscence, the remaining sutures were removed today.  I recommend we begin and initiate negative pressure wound therapy.  Referral for this was sent to [facility] and home nursing referral was sent to [place] to begin.  Change 3 times weekly I will see him back in 3 weeks for follow-up.  Return in about 3 weeks (around 04/10/2022) for wound care.

## 2022-03-20 NOTE — Consult Note (Signed)
Patient Demographics  [name], is a 62 y.o. male   MRN: [id number]   DOB - [birth year]  Admit Date - 03/20/2022    Outpatient Primary MD for the patient is [name], MD  Consult requested in the Hospital by Davonna Belling, MD, On 03/20/2022    Reason for consult : evaluate for admission   With History of -  Past Medical History:  Diagnosis Date   Acute biliary pancreatitis 10/23/2013   Acute osteomyelitis of right foot ([place]) 05/22/2021   AKI (acute kidney injury) ([place]) 05/22/2021   Arthritis    Biliary colic [id number]   Chronic osteomyelitis of toe of right foot ([place])    Diabetes mellitus    GERD (gastroesophageal reflux disease)    Headache(784.0)    History of kidney stones    Hypertension    Kidney stones    Neuropathy    Osteomyelitis of fourth toe of right foot ([place]) 06/16/2019      Past Surgical History:  Procedure Laterality Date   ABDOMINAL AORTOGRAM W/LOWER EXTREMITY N/A 02/10/2022   Procedure: ABDOMINAL AORTOGRAM W/LOWER EXTREMITY;  Surgeon: Broadus John, MD;  Location: [place] [facility];  Service: Cardiovascular;  Laterality: N/A;   ACHILLES TENDON SURGERY Right 05/26/2021   Procedure: ACHILLES LENGTHENING/KIDNER;  Surgeon: Criselda Peaches, DPM;  Location: [place];  Service: Podiatry;  Laterality: Right;   ACHILLES TENDON SURGERY Left 02/11/2022   Procedure: ACHILLES LENGTHENING/KIDNER;  Surgeon: Criselda Peaches, DPM;  Location: [place];  Service: Podiatry;  Laterality: Left;   AMPUTATION Left 02/11/2022   Procedure: LEFT TRANSMETATARSAL AMPUTATION;  Surgeon: Criselda Peaches, DPM;  Location: [place];  Service: Podiatry;  Laterality: Left;   AMPUTATION TOE Right 06/21/2019   Procedure: AMPUTATION TOE;  Surgeon: Aviva Signs, MD;  Location: [facility];  Service: General;  Laterality: Right;   AMPUTATION TOE Right 08/29/2020   Procedure: RIGHT THIRD AMPUTATION  TOE;  Surgeon: Aviva Signs, MD;  Location: [facility];  Service: General;  Laterality: Right;  third toe   AMPUTATION TOE Right 02/20/2021   Procedure: AMPUTATION  TRANSMATARSAL  RIGHT; GREAT TOE;  Surgeon: Aviva Signs, MD;  Location: [facility];  Service: General;  Laterality: Right;   AMPUTATION TOE Left 02/07/2022   Procedure: AMPUTATION TOE;  Surgeon: Criselda Peaches, DPM;  Location: [place];  Service: Podiatry;  Laterality: Left;  amputation of remainder of Left 4th toe   BACK SURGERY  2022   CARDIAC CATHETERIZATION  01/21/2007   CHOLECYSTECTOMY N/A 10/24/2013   Procedure: LAPAROSCOPIC CHOLECYSTECTOMY;  Surgeon: Jamesetta So, MD;  Location: [facility];  Service: General;  Laterality: N/A;   COLONOSCOPY N/A 03/31/2013   Procedure: COLONOSCOPY;  Surgeon: Rogene Houston, MD;  [facility];  Service: Endoscopy;  Laterality: N/A;  [place] Right 04/19/2021   Procedure: IRRIGATION AND DEBRIDEMENT RIGHT SECOND TOE;  Surgeon: Aviva Signs, MD;  Location: [facility];  Service: General;  Laterality: Right;   SECONDARY  CLOSURE OF WOUND Right 04/19/2021   Procedure: IRRIGATION AND DEBRIDEMENT SECONDARY CLOSURE OF RIGHT GREAT TOE WOUND;  Surgeon: Aviva Signs, MD;  Location: [facility];  Service: General;  Laterality: Right;   TRANSMETATARSAL AMPUTATION Right 05/24/2021   Procedure: TRANSMETATARSAL AMPUTATION;  Surgeon: Criselda Peaches, DPM;  Location: [place];  Service: Podiatry;  Laterality: Right;   TRANSMETATARSAL AMPUTATION Right 05/26/2021   Procedure: TRANSMETATARSAL AMPUTATION Revision;  Surgeon: Criselda Peaches, DPM;  Location: [place];  Service: Podiatry;  Laterality: Right;    in for   Chief Complaint  Patient presents with   Weakness     HPI  [name]  is a 62 y.o. male, who presents to ED secondary to concern of weakness, falls, and speech changes, patient lives with his significant other, daughter took him to his podiatrist today, he was noted and to have  more slurred speech, and to have unsteady gait, for which she brought him to ED, reports he has generalized weakness, unsteady gait for few months now, he reports he had been feeling unsteady, where he had 4 falls last night, denies fever, chills, loss of consciousness, daughter reports she has noted his speech to be more slurred, in ED no significant labs abnormality, no evidence of infection on chest x-ray, had negative UA, MRI brain was obtained, with no acute finding, it was at his podiatry to evaluate for wound dehiscence, and ulcers in the left foot status post left foot surgery, per reviewing that recommendation, wound appears to be improving, and recommendation with wound VAC and home health, referral [facility], [facility] hospitalist consulted to evaluate for admission.    Review of Systems     A full 10 point Review of Systems was done, except as stated above, all other Review of Systems were negative.   Social History Social History   Tobacco Use   Smoking status: Former    Packs/day: 1.00    Years: 7.00    Total pack years: 7.00    Types: Cigarettes    Quit date: 01/30/1982    Years since quitting: 40.1   Smokeless tobacco: Never  Substance Use Topics   Alcohol use: No    Family History Family History  Problem Relation Age of Onset   Diabetes Mother    Heart failure Mother    Heart disease Mother    Heart failure Father      Prior to Admission medications   Medication Sig Start Date End Date Taking? Authorizing Provider  acetaminophen (TYLENOL) 500 MG tablet Take 2 tablets (1,000 mg total) by mouth every 6 (six) hours as needed for up to 20 doses (pain). 02/14/22  Yes Sheikh, Omair Latif, DO  ALPRAZolam Duanne Moron) 0.5 MG tablet Take 0.5 mg by mouth 3 (three) times daily. 02/25/15  Yes [provider]  aspirin EC 81 MG tablet Take 1 tablet (81 mg total) by mouth daily with breakfast. 06/22/19  Yes Emokpae, Courage, MD  aspirin-acetaminophen-caffeine  (EXCEDRIN MIGRAINE) [phone] MG tablet Take 1 tablet by mouth every 6 (six) hours as needed for headache or migraine.   Yes [provider]  atorvastatin (LIPITOR) 20 MG tablet Take 20 mg by mouth daily. 03/31/19  Yes [provider]  buPROPion (WELLBUTRIN XL) 300 MG 24 hr tablet Take 300 mg by mouth daily. 08/06/20  Yes [provider]  dexlansoprazole (DEXILANT) 60 MG capsule Take 1 capsule by mouth daily. 12/28/21  Yes [provider]  diclofenac Sodium (VOLTAREN)  1 % GEL Apply 2 g topically daily as needed (pain). 03/14/21  Yes [provider]  diltiazem (TIAZAC) 420 MG 24 hr capsule Take 420 mg by mouth daily. 02/20/19  Yes [provider]  DULoxetine (CYMBALTA) 60 MG capsule Take 60 mg by mouth daily.   Yes [provider]  furosemide (LASIX) 20 MG tablet Take 20 mg by mouth daily. 11/25/19  Yes [provider]  gabapentin (NEURONTIN) 800 MG tablet Take 800 mg by mouth 3 (three) times daily. 08/29/19  Yes [provider]  hydrALAZINE (APRESOLINE) 10 MG tablet Take 10 mg by mouth See admin instructions. Take 1 tablet by mouth daily (orange tablet)   Yes [provider]  insulin glargine (LANTUS) 100 UNIT/ML injection Inject 80 Units into the skin daily.   Yes [provider]  metFORMIN (GLUCOPHAGE) 500 MG tablet Take 1,000 mg by mouth in the morning and at bedtime. In the morning & 1300   Yes [provider]  Multiple Vitamin (MULTIVITAMIN WITH MINERALS) TABS tablet Take 1 tablet by mouth daily.   Yes [provider]  ondansetron (ZOFRAN) 4 MG tablet Take 4 mg by mouth 4 (four) times daily. 03/04/22  Yes [provider]  oxyCODONE (ROXICODONE) 15 MG immediate release tablet Take 15 mg by mouth every 4 (four) hours. 02/11/22  Yes [provider]  polyethylene glycol powder (GLYCOLAX/MIRALAX) 17 GM/SCOOP powder Take 1 capful (17 g) by mouth daily as needed for mild  constipation. 02/14/22  Yes Sheikh, Omair Latif, DO  sitaGLIPtin (JANUVIA) 100 MG tablet Take 100 mg by mouth daily.   Yes [provider]  sulfamethoxazole-trimethoprim (BACTRIM DS) 800-160 MG tablet Take 1 tablet by mouth 2 (two) times daily. Patient taking differently: Take 1 tablet by mouth 2 (two) times daily. Start on 03/13/22 for 10 days supply 03/13/22  Yes McDonald, Stephan Minister, DPM  XTAMPZA ER 9 MG C12A Take 1 capsule by mouth 2 (two) times daily. 02/12/22  Yes [provider]  amoxicillin-clavulanate (AUGMENTIN) 875-125 MG tablet Take 1 tablet by mouth 2 (two) times daily for 28 days. Patient not taking: Reported on 03/20/2022 02/26/22 03/26/22  Rosiland Oz, MD    Anti-infectives (From admission, onward)    None       Scheduled Meds: Continuous Infusions: PRN Meds:.  Allergies  Allergen Reactions   Latex Rash and Other (See Comments)    Redness     Physical Exam  Vitals  Blood pressure (!) 166/85, pulse (!) 102, temperature 98 F (36.7 C), temperature source Oral, resp. rate 17, height '5\' 9"'$  (1.753 m), weight 84.4 kg, SpO2 98 %.   1. General W male, laying in bed, in no apparent distress  2. Normal affect and insight, Not Suicidal or Homicidal, Awake Alert, Oriented X 3.  3. No F.N deficits, ALL C.Nerves Intact, Strength 5/5 all 4 extremities, Sensation intact all 4 extremities, Plantars down going.  4. Ears and Eyes appear Normal, Conjunctivae clear, PERRLA. Moist Oral Mucosa.  5. Supple Neck, No JVD, No cervical lymphadenopathy appriciated, No Carotid Bruits.  6. Symmetrical Chest wall movement, Good air movement bilaterally, CTAB.  7. RRR, No Gallops, Rubs or Murmurs, No Parasternal Heave.  8. Positive Bowel Sounds, Abdomen Soft, No tenderness, No organomegaly appriciated,No rebound -guarding or rigidity.  9.  No Cyanosis, Normal Skin Turgor, No Skin Rash or Bruise.  10. Good muscle tone,  joints appear normal , no effusions, Normal ROM.   Wearing left foot orthodontic shoe  Data Review  CBC Recent Labs  Lab 03/20/22 1425  WBC 10.3  HGB 10.6*  HCT 34.5*  PLT 282  MCV 77.7*  MCH 23.9*  MCHC 30.7  RDW 16.4*  LYMPHSABS 3.8  MONOABS 0.7  EOSABS 0.4  BASOSABS 0.1   ------------------------------------------------------------------------------------------------------------------  Chemistries  Recent Labs  Lab 03/20/22 1425  NA 139  K 3.6  CL 107  CO2 22  GLUCOSE 65*  BUN 18  CREATININE 1.33*  CALCIUM 8.9  AST 27  ALT 18  ALKPHOS 84  BILITOT <0.1*   ------------------------------------------------------------------------------------------------------------------ estimated creatinine clearance is 59.1 mL/min (A) (by C-G formula based on SCr of 1.33 mg/dL (H)). ------------------------------------------------------------------------------------------------------------------ No results for input(s): "TSH", "T4TOTAL", "T3FREE", "THYROIDAB" in the last 72 hours.  Invalid input(s): "FREET3"   Coagulation profile Recent Labs  Lab 03/20/22 1425  INR 1.0   ------------------------------------------------------------------------------------------------------------------- No results for input(s): "DDIMER" in the last 72 hours. -------------------------------------------------------------------------------------------------------------------  Cardiac Enzymes No results for input(s): "CKMB", "TROPONINI", "MYOGLOBIN" in the last 168 hours.  Invalid input(s): "CK" ------------------------------------------------------------------------------------------------------------------ Invalid input(s): "POCBNP"   ---------------------------------------------------------------------------------------------------------------  Urinalysis    Component Value Date/Time   COLORURINE YELLOW 03/20/2022 [place] 03/20/2022 1735   LABSPEC 1.018 03/20/2022 1735   PHURINE 5.0 03/20/2022 1735    GLUCOSEU NEGATIVE 03/20/2022 1735   HGBUR NEGATIVE 03/20/2022 1735   BILIRUBINUR NEGATIVE 03/20/2022 1735   KETONESUR NEGATIVE 03/20/2022 1735   PROTEINUR 30 (A) 03/20/2022 1735   UROBILINOGEN 0.2 06/12/2014 1255   NITRITE NEGATIVE 03/20/2022 1735   LEUKOCYTESUR NEGATIVE 03/20/2022 1735     Imaging results:   DG Shoulder Right  Result Date: 03/20/2022 CLINICAL DATA:  Weakness.  Right shoulder pain. EXAM: RIGHT SHOULDER - 2+ VIEW COMPARISON:  None Available. FINDINGS: No acute fracture or dislocation is identified. Mild degenerative changes are noted at the acromioclavicular joint. The soft tissues are unremarkable. IMPRESSION: No acute osseous abnormality. Electronically Signed   By: Logan Bores M.D.   On: 03/20/2022 16:36   DG Chest 1 View  Result Date: 03/20/2022 CLINICAL DATA:  Weakness skin is chest x-ray 03/16/2019 EXAM: CHEST  1 VIEW COMPARISON:  None Available. FINDINGS: The heart size and mediastinal contours are within normal limits. Both lungs are clear. The visualized skeletal structures are unremarkable. IMPRESSION: No active disease. Electronically Signed   By: Ronney Asters M.D.   On: 03/20/2022 16:36   MR BRAIN WO CONTRAST  Result Date: 03/20/2022 CLINICAL DATA:  Transient ischemic attack (TIA) EXAM: MRI HEAD WITHOUT CONTRAST TECHNIQUE: Multiplanar, multiecho pulse sequences of the brain and surrounding structures were obtained without intravenous contrast. COMPARISON:  CT head 01/07/2018. FINDINGS: Overall, mildly motion limited study. Brain: No acute infarction, hemorrhage, hydrocephalus, extra-axial collection or mass lesion. Vascular: Major arterial flow voids are maintained at the skull base. Skull and upper cervical spine: Normal marrow signal. Sinuses/Orbits: Clear sinuses.  No acute orbital findings. Other: No sizable mastoid effusions. IMPRESSION: Normal brain MRI.  No acute abnormality. Electronically Signed   By: Margaretha Sheffield M.D.   On: 03/20/2022 15:51   US  Venous Img Lower  Left (DVT Study)  Result Date: 03/20/2022 CLINICAL DATA:  Left lower extremity swelling and pain left foot ulcer. EXAM: Left LOWER EXTREMITY VENOUS DOPPLER ULTRASOUND TECHNIQUE: Gray-scale sonography with compression, as well as color and duplex ultrasound, were performed to evaluate the deep venous system(s) from the level of the common femoral vein through the popliteal and proximal calf veins. COMPARISON:  None Available. FINDINGS: VENOUS Normal compressibility of  the common femoral, superficial femoral, and popliteal veins, as well as the visualized calf veins. Visualized portions of profunda femoral vein and great saphenous vein unremarkable. No filling defects to suggest DVT on grayscale or color Doppler imaging. Doppler waveforms show normal direction of venous flow, normal respiratory plasticity and response to augmentation. Limited views of the contralateral common femoral vein are unremarkable. OTHER None. Limitations: none IMPRESSION: No evidence of left lower extremity DVT Electronically Signed   By: Jill Side M.D.   On: 03/20/2022 15:29        Assessment & Plan  Active Problems:   * No active hospital problems. *   Generalized  weakness, unsteady gait -Presents with chronic findings, weakness, right shoulder pain, falls, will some mild slurred speech, but he has no focal deficits on exam, and he has been sent by his podiatrist earlier today for which he has been ordered PT/OT to start next week, and it does appear he will be started on wound VAC as well for his left foot wounds, MRI brain with no acute findings, no significant labs abnormalities, no leukocytosis, no UTI, x-ray with no active disease, right shoulder with no acute osseous abnormality on imaging, I have discussed with him, daughter, I think very likely polypharmacy contributing to his generalized weakness and unsteady gait, as he is on multiple narcotics including significant dose of Xanax, oxycodone and  Xtampza, I have asked the patient to decrease his medication regimen by 50% , and to follow with his pain medicine specialist to taper further if possible, and discussed with him not to ambulate without assistance, he lives with his significant other, and daughter at bedside reports she will help as well tell PT/OT see him next week. -Status post left foot surgery, he was seen today by his podiatrist, wound appears to be improving, plan for wound VAC and dressing changes. -Patient can be discharged home from ED with recommendation to follow his primary care next week, and with pain medicine physician next week as well, and as he reports PT/OT supposed to start seeing him early next week.  AM Labs Ordered, also please review Full Orders  Family Communication: Plan discussed with patient and daughter at bedside   Thank you for the consult, we will follow the patient with you in the Hospital.   Phillips Climes M.D on 03/20/2022 at 7:16 PM   Thank you for the consult, we will follow the patient with you in the [facility]   Office  [phone]

## 2022-03-21 NOTE — Telephone Encounter (Signed)
Giving that he has medicaid and medicare it is very low due to short staffing ill look into one other one and see if they have anything but I can't promise you.

## 2022-03-24 NOTE — Telephone Encounter (Signed)
I faxed patient information over to [facility] to see if they have any availability. I will keep you updated as soon as I get a response back from them.

## 2022-03-25 DIAGNOSIS — M25511 Pain in right shoulder: Secondary | ICD-10-CM | POA: Diagnosis not present

## 2022-03-25 DIAGNOSIS — M5416 Radiculopathy, lumbar region: Secondary | ICD-10-CM | POA: Diagnosis not present

## 2022-03-26 DIAGNOSIS — E11628 Type 2 diabetes mellitus with other skin complications: Secondary | ICD-10-CM | POA: Diagnosis not present

## 2022-03-26 DIAGNOSIS — L0889 Other specified local infections of the skin and subcutaneous tissue: Secondary | ICD-10-CM | POA: Diagnosis not present

## 2022-03-26 DIAGNOSIS — Z4781 Encounter for orthopedic aftercare following surgical amputation: Secondary | ICD-10-CM | POA: Diagnosis not present

## 2022-03-26 DIAGNOSIS — Z89432 Acquired absence of left foot: Secondary | ICD-10-CM | POA: Diagnosis not present

## 2022-03-26 NOTE — Telephone Encounter (Signed)
He said he had the wound vac because he has been paying out of pocket for it.

## 2022-03-27 DIAGNOSIS — S91302A Unspecified open wound, left foot, initial encounter: Secondary | ICD-10-CM | POA: Diagnosis not present

## 2022-03-27 DIAGNOSIS — T8189XA Other complications of procedures, not elsewhere classified, initial encounter: Secondary | ICD-10-CM | POA: Diagnosis not present

## 2022-03-28 DIAGNOSIS — S91301A Unspecified open wound, right foot, initial encounter: Secondary | ICD-10-CM | POA: Diagnosis not present

## 2022-03-28 DIAGNOSIS — M21961 Unspecified acquired deformity of right lower leg: Secondary | ICD-10-CM | POA: Diagnosis not present

## 2022-03-28 DIAGNOSIS — T819XXA Unspecified complication of procedure, initial encounter: Secondary | ICD-10-CM | POA: Diagnosis not present

## 2022-04-02 DIAGNOSIS — M67911 Unspecified disorder of synovium and tendon, right shoulder: Secondary | ICD-10-CM | POA: Diagnosis not present

## 2022-04-02 DIAGNOSIS — E1129 Type 2 diabetes mellitus with other diabetic kidney complication: Secondary | ICD-10-CM | POA: Diagnosis not present

## 2022-04-02 DIAGNOSIS — M86171 Other acute osteomyelitis, right ankle and foot: Secondary | ICD-10-CM | POA: Diagnosis not present

## 2022-04-02 DIAGNOSIS — I10 Essential (primary) hypertension: Secondary | ICD-10-CM | POA: Diagnosis not present

## 2022-04-02 DIAGNOSIS — E1142 Type 2 diabetes mellitus with diabetic polyneuropathy: Secondary | ICD-10-CM | POA: Diagnosis not present

## 2022-04-02 DIAGNOSIS — I700 Atherosclerosis of aorta: Secondary | ICD-10-CM | POA: Diagnosis not present

## 2022-04-02 DIAGNOSIS — M4306 Spondylolysis, lumbar region: Secondary | ICD-10-CM | POA: Diagnosis not present

## 2022-04-02 DIAGNOSIS — E1140 Type 2 diabetes mellitus with diabetic neuropathy, unspecified: Secondary | ICD-10-CM | POA: Diagnosis not present

## 2022-04-02 DIAGNOSIS — G894 Chronic pain syndrome: Secondary | ICD-10-CM | POA: Diagnosis not present

## 2022-04-04 ENCOUNTER — Telehealth: Payer: Self-pay | Admitting: Podiatry

## 2022-04-04 NOTE — Telephone Encounter (Signed)
Left message on patient vm returning his call .  He can pick his boots up next week when he comes in to see [name] or we can schedule him for 04/21/22?

## 2022-04-10 ENCOUNTER — Ambulatory Visit (INDEPENDENT_AMBULATORY_CARE_PROVIDER_SITE_OTHER): Payer: 59 | Admitting: Podiatry

## 2022-04-10 DIAGNOSIS — L97522 Non-pressure chronic ulcer of other part of left foot with fat layer exposed: Secondary | ICD-10-CM | POA: Diagnosis not present

## 2022-04-10 NOTE — Patient Instructions (Signed)
Please bring your wound VAC, canister, and sponges to the next visit

## 2022-04-13 NOTE — Progress Notes (Signed)
  Subjective:  Patient ID: [name], male    DOB: 03-05-1960,  MRN: [id number]  Chief Complaint  Patient presents with   Foot Ulcer    3 week wound check left       62 y.o. male returns for post-op check.  Continues to improve, they note drainage is reducing  Review of Systems: Negative except as noted in the HPI. Denies N/V/F/Ch.   Objective:  There were no vitals filed for this visit. There is no height or weight on file to calculate BMI. Constitutional Well developed. Well nourished.  Vascular Foot warm and well perfused. Capillary refill normal to all digits.  Calf is soft and supple, no posterior calf or knee pain, negative Homans' sign  Neurologic Normal speech. Oriented to person, place, and time. Epicritic sensation to light touch grossly absent bilaterally.  Dermatologic Central area of deep dehiscence with residual ulceration noted measuring 1.5 x 1.0 x 0.8 cm, serous drainage exposed subcutaneous tissue with fibrogranular wound bed surrounding hyperkeratosis no signs of infection  Orthopedic: He has some sensitivity and tenderness to palpation noted about the surgical site.    Assessment:   1. Ulcer of left foot with fat layer exposed ([place])    Plan:  Patient was evaluated and treated and all questions answered.  S/p foot surgery left -Continues to improve he received the wound VAC we have had difficulty getting home nursing will changes regularly.  He will bring the back into clinic next week we will apply and then he will be sent to the nurses schedule 3 times a week for this  Return in about 1 week (around 04/17/2022) for wound care, VAC application.

## 2022-04-14 DIAGNOSIS — M25511 Pain in right shoulder: Secondary | ICD-10-CM | POA: Diagnosis not present

## 2022-04-14 NOTE — Telephone Encounter (Signed)
They don't normally reach out to [place] unless they are not accepting patients and I haven't heard anything from them since I sent everything over to them.

## 2022-04-17 ENCOUNTER — Ambulatory Visit (INDEPENDENT_AMBULATORY_CARE_PROVIDER_SITE_OTHER): Payer: 59 | Admitting: Podiatry

## 2022-04-17 DIAGNOSIS — T8132XA Disruption of internal operation (surgical) wound, not elsewhere classified, initial encounter: Secondary | ICD-10-CM

## 2022-04-17 DIAGNOSIS — L97522 Non-pressure chronic ulcer of other part of left foot with fat layer exposed: Secondary | ICD-10-CM

## 2022-04-17 NOTE — Patient Instructions (Signed)
Bring the Wound VAC machine, the canister, and the sponge to the next visit

## 2022-04-17 NOTE — Progress Notes (Signed)
  Subjective:  Patient ID: [name], male    DOB: 10-01-1960,  MRN: [id number]  Chief Complaint  Patient presents with   Foot Ulcer    wound care, VAC application 1wk       62 y.o. male returns for post-op check.    Review of Systems: Negative except as noted in the HPI. Denies N/V/F/Ch.   Objective:  There were no vitals filed for this visit. There is no height or weight on file to calculate BMI. Constitutional Well developed. Well nourished.  Vascular Foot warm and well perfused. Capillary refill normal to all digits.  Calf is soft and supple, no posterior calf or knee pain, negative Homans' sign  Neurologic Normal speech. Oriented to person, place, and time. Epicritic sensation to light touch grossly absent bilaterally.  Dermatologic Central area of deep dehiscence with residual ulceration noted measuring 1.5 x 1.0 x 0.8 cm, serous drainage exposed subcutaneous tissue with fibrogranular wound bed surrounding hyperkeratosis no signs of infection  Orthopedic: He has some sensitivity and tenderness to palpation noted about the surgical site.    Assessment:   1. Ulcer of left foot with fat layer exposed ([place])   2. Deep disruption or dehiscence of operation wound, initial encounter    Plan:  Patient was evaluated and treated and all questions answered.  S/p foot surgery left -Unfortunately did not bring his wound VAC supplies with him today.  I showed him what he will need to bring next week.  Will have him scheduled to see [place] on the nursing schedule multiple times per week and me on Wednesdays for debridements.  Our RN Joycelyn Schmid saw him with me as well who will be doing the [facility] application next week.  I will see him back the following Wednesday for debridement in office.  Discussed avoiding putting Vaseline to close the incision as it is creating maceration, we will plan to window of the [facility] application to reduce this as well.  Return for wound care.

## 2022-04-19 DIAGNOSIS — M25511 Pain in right shoulder: Secondary | ICD-10-CM | POA: Diagnosis not present

## 2022-04-21 ENCOUNTER — Ambulatory Visit (INDEPENDENT_AMBULATORY_CARE_PROVIDER_SITE_OTHER): Payer: 59 | Admitting: *Deleted

## 2022-04-21 DIAGNOSIS — M25511 Pain in right shoulder: Secondary | ICD-10-CM | POA: Diagnosis not present

## 2022-04-21 DIAGNOSIS — L97522 Non-pressure chronic ulcer of other part of left foot with fat layer exposed: Secondary | ICD-10-CM | POA: Diagnosis not present

## 2022-04-21 NOTE — Progress Notes (Signed)
Patient presents today for wound vac application to the 2 wounds on the stump of the left foot.  Peri wound drapes applied with gray sponge cut and shaped to patient's wounds x 2. Foam cut to bridge the 2 wounds and to bridge the connector to the dorsal foot.       Vac seal was successful, no leaks. Gave instructions for keeping clean and also troubleshooting any leaks.   Dispensed a surgical shoe. He will call with any concerns. He will follow up with Dr. Sherryle Lis on Thursday for re-evaluation and another vac application, if needed. We kept some supplies of his in the office just in case he forgot to bring at future visits.

## 2022-04-23 ENCOUNTER — Other Ambulatory Visit: Payer: 59

## 2022-04-24 ENCOUNTER — Ambulatory Visit (INDEPENDENT_AMBULATORY_CARE_PROVIDER_SITE_OTHER): Payer: 59 | Admitting: Podiatry

## 2022-04-24 DIAGNOSIS — L97525 Non-pressure chronic ulcer of other part of left foot with muscle involvement without evidence of necrosis: Secondary | ICD-10-CM | POA: Diagnosis not present

## 2022-04-24 NOTE — Progress Notes (Signed)
  Subjective:  Patient ID: [name], male    DOB: [birth year],  MRN: [id number]  Chief Complaint  Patient presents with   Foot Ulcer    wound vac see [name] per [name] p       62 y.o. male returns for post-op check.  He reports no issues with the wound VAC.  Says he thinks it is doing okay.  Review of Systems: Negative except as noted in the HPI. Denies N/V/F/Ch.   Objective:  There were no vitals filed for this visit. There is no height or weight on file to calculate BMI. Constitutional Well developed. Well nourished.  Vascular Foot warm and well perfused. Capillary refill normal to all digits.  Calf is soft and supple, no posterior calf or knee pain, negative Homans' sign  Neurologic Normal speech. Oriented to person, place, and time. Epicritic sensation to light touch grossly absent bilaterally.  Dermatologic Central area of deep dehiscence with residual ulceration noted measuring 1.5 x 1.0 x 1.0 cm, serous drainage exposed subcutaneous tissue and fascia with fibrogranular wound bed surrounding hyperkeratosis no signs of infection  Orthopedic: He has some sensitivity and tenderness to palpation noted about the surgical site.    Assessment:   1. Ulcer of left foot with muscle involvement without evidence of necrosis    Plan:  Patient was evaluated and treated and all questions answered.  S/p foot surgery left -Wound was inspected, there are no signs of infection.  It was cleansed and thoroughly debrided of all nonviable tissue in an excisional manner with a sharp scalpel to the level of the deep fascia.  Measurements are noted above postdebridement.  Not much change so far but I recommend we continue to utilize negative pressure wound therapy to enhance granulation tissue formation and reduce wound depth.  Following debridement the wound was irrigated and cleansed, hemostasis achieved and is negative pressure wound therapy device was reapplied.  Set to 125 mmHg to  change 3 times weekly and we will see him twice on Monday and Friday on the nurse schedule next week and he will see me on Wednesday for further debridement.  Return in about 6 days (around 04/30/2022) for wound care, VAC.

## 2022-04-25 ENCOUNTER — Other Ambulatory Visit: Payer: 59

## 2022-04-25 NOTE — Telephone Encounter (Signed)
Patient needs to speak with Dr Lilian Kapur about getting a toe filler, we explained this to him the last time we talked to him on the phone. Patient has already picked up his diabetic shoes back in November, he returned them because they were defective and the strap broke.   We got him a replacement for the defective shoes.  (In the process of getting his replacement shoes he had toe amputation and now he is wanting a toe filler for in place of amputation.) He is not understanding this has to be ordered by the doctor and approved by his insurance, we can't just give him a toe filler.

## 2022-04-25 NOTE — Telephone Encounter (Signed)
Patient called and stated he wanted to know what the status of his diabetic shoes

## 2022-04-26 DIAGNOSIS — T8189XA Other complications of procedures, not elsewhere classified, initial encounter: Secondary | ICD-10-CM | POA: Diagnosis not present

## 2022-04-26 DIAGNOSIS — S91302A Unspecified open wound, left foot, initial encounter: Secondary | ICD-10-CM | POA: Diagnosis not present

## 2022-04-28 ENCOUNTER — Ambulatory Visit (INDEPENDENT_AMBULATORY_CARE_PROVIDER_SITE_OTHER): Payer: 59 | Admitting: Podiatry

## 2022-04-28 DIAGNOSIS — L97522 Non-pressure chronic ulcer of other part of left foot with fat layer exposed: Secondary | ICD-10-CM

## 2022-04-28 DIAGNOSIS — T819XXA Unspecified complication of procedure, initial encounter: Secondary | ICD-10-CM | POA: Diagnosis not present

## 2022-04-28 DIAGNOSIS — L97525 Non-pressure chronic ulcer of other part of left foot with muscle involvement without evidence of necrosis: Secondary | ICD-10-CM | POA: Diagnosis not present

## 2022-04-28 DIAGNOSIS — M21961 Unspecified acquired deformity of right lower leg: Secondary | ICD-10-CM | POA: Diagnosis not present

## 2022-04-28 DIAGNOSIS — S91301A Unspecified open wound, right foot, initial encounter: Secondary | ICD-10-CM | POA: Diagnosis not present

## 2022-04-28 NOTE — Progress Notes (Signed)
Patient presents today for wound vac application to the 2 wounds on the stump of the left foot.  He states that the lateral foot wound has been really sore. There is also a lot of blood under the drape. He said that the machine kept showing an error sign and wasn't sealing, also run the machine's battery dead.   Peri wound drapes applied with gray sponge cut and shaped to patient's wounds x 2. Foam cut to bridge the 2 wounds and to bridge the connector to the dorsal foot.        Vac seal was successful, no leaks. Gave instructions for keeping clean.  He will call with any concerns. He will follow up on Wednesday for another vac application.

## 2022-04-30 ENCOUNTER — Ambulatory Visit (INDEPENDENT_AMBULATORY_CARE_PROVIDER_SITE_OTHER): Payer: 59 | Admitting: Podiatry

## 2022-04-30 DIAGNOSIS — L97525 Non-pressure chronic ulcer of other part of left foot with muscle involvement without evidence of necrosis: Secondary | ICD-10-CM | POA: Diagnosis not present

## 2022-04-30 NOTE — Progress Notes (Signed)
  Subjective:  Patient ID: [name], male    DOB: Nov 07, 1960,  MRN: [id number]  Chief Complaint  Patient presents with   Foot Ulcer    wound care, VAC       62 y.o. male returns for post-op check.  He reports no issues with the wound VAC.  Says he thinks it is doing okay.  Review of Systems: Negative except as noted in the HPI. Denies N/V/F/Ch.   Objective:  There were no vitals filed for this visit. There is no height or weight on file to calculate BMI. Constitutional Well developed. Well nourished.  Vascular Foot warm and well perfused. Capillary refill normal to all digits.  Calf is soft and supple, no posterior calf or knee pain, negative Homans' sign  Neurologic Normal speech. Oriented to person, place, and time. Epicritic sensation to light touch grossly absent bilaterally.  Dermatologic Central area of deep dehiscence with residual ulceration noted measuring 2 separate areas of ulceration measuring 1.5 x 0.8 x 1.0 medially and 0.8 x 0.4 x 0.3 cm laterally, serous drainage exposed subcutaneous tissue and fascia with fibrogranular wound bed surrounding hyperkeratosis no signs of infection  Orthopedic: He has some sensitivity and tenderness to palpation noted about the surgical site.    Assessment:   1. Ulcer of left foot with muscle involvement without evidence of necrosis    Plan:  Patient was evaluated and treated and all questions answered.  S/p foot surgery left -Wound was inspected, there are no signs of infection.  It was cleansed and thoroughly debrided of all nonviable tissue in an excisional manner with a sharp scalpel to the level of the deep fascia.  Measurements are noted above postdebridement.  Beginning to improve.  Following debridement the wound was irrigated and cleansed, hemostasis achieved and silver collagen matrix was packed into the wound. Negative pressure wound therapy device was reapplied.  Set to 125 mmHg to change 3 times weekly and we  will see him twice on Monday and Friday on the nurse schedule next week and he will see me on Wednesday for further debridement.  No follow-ups on file.

## 2022-05-02 ENCOUNTER — Other Ambulatory Visit: Payer: 59

## 2022-05-02 DIAGNOSIS — I10 Essential (primary) hypertension: Secondary | ICD-10-CM | POA: Diagnosis not present

## 2022-05-02 DIAGNOSIS — Z89432 Acquired absence of left foot: Secondary | ICD-10-CM | POA: Diagnosis not present

## 2022-05-02 DIAGNOSIS — M4306 Spondylolysis, lumbar region: Secondary | ICD-10-CM | POA: Diagnosis not present

## 2022-05-02 DIAGNOSIS — E1142 Type 2 diabetes mellitus with diabetic polyneuropathy: Secondary | ICD-10-CM | POA: Diagnosis not present

## 2022-05-02 DIAGNOSIS — G894 Chronic pain syndrome: Secondary | ICD-10-CM | POA: Diagnosis not present

## 2022-05-02 DIAGNOSIS — I700 Atherosclerosis of aorta: Secondary | ICD-10-CM | POA: Diagnosis not present

## 2022-05-02 DIAGNOSIS — M86171 Other acute osteomyelitis, right ankle and foot: Secondary | ICD-10-CM | POA: Diagnosis not present

## 2022-05-05 ENCOUNTER — Ambulatory Visit (INDEPENDENT_AMBULATORY_CARE_PROVIDER_SITE_OTHER): Payer: 59 | Admitting: Podiatry

## 2022-05-05 VITALS — BP 158/86 | HR 85

## 2022-05-05 DIAGNOSIS — L97525 Non-pressure chronic ulcer of other part of left foot with muscle involvement without evidence of necrosis: Secondary | ICD-10-CM | POA: Diagnosis not present

## 2022-05-05 NOTE — Progress Notes (Signed)
  Subjective:  Patient ID: OTHER SCHNITZER, male    DOB: 1961/01/10,  MRN: [id number]  Chief Complaint  Patient presents with   Wound Check    "I hope it's doing okay.  I want to get rid of this thing."       62 y.o. male returns for post-op check.  He reports no issues with the wound VAC.  Says he thinks it is doing okay.  Review of Systems: Negative except as noted in the HPI. Denies N/V/F/Ch.   Objective:   Vitals:   05/05/22 0812  BP: (!) 158/86  Pulse: 85   There is no height or weight on file to calculate BMI. Constitutional Well developed. Well nourished.  Vascular Foot warm and well perfused. Capillary refill normal to all digits.  Calf is soft and supple, no posterior calf or knee pain, negative Homans' sign  Neurologic Normal speech. Oriented to person, place, and time. Epicritic sensation to light touch grossly absent bilaterally.  Dermatologic Central area of deep dehiscence with residual ulceration noted measuring 2 separate areas of ulceration measuring 1.4 x 0.6 x 0.8 medially and 0.8 x 0.2 x 0.3 cm laterally, serous drainage exposed subcutaneous tissue and fascia with fibrogranular wound bed surrounding hyperkeratosis no signs of infection  Orthopedic: He has some sensitivity and tenderness to palpation noted about the surgical site.    Assessment:   No diagnosis found.  Plan:  Patient was evaluated and treated and all questions answered.  S/p foot surgery left -Wound was inspected, there are no signs of infection.  It was cleansed and thoroughly debrided of all nonviable tissue in an excisional manner with a sharp scalpel to the level of the deep fascia.  Measurements are noted above postdebridement.   Following debridement the wound was irrigated and cleansed, hemostasis achieved and Negative pressure wound therapy device was reapplied.  Set to 125 mmHg to change 3 times weekly and we will see him twice on Monday and Friday on the nurse schedule next  week and he will see me on Wednesday for further debridement.  No follow-ups on file.

## 2022-05-07 ENCOUNTER — Ambulatory Visit (INDEPENDENT_AMBULATORY_CARE_PROVIDER_SITE_OTHER): Payer: 59 | Admitting: Podiatry

## 2022-05-07 ENCOUNTER — Ambulatory Visit: Payer: 59 | Admitting: Podiatry

## 2022-05-07 ENCOUNTER — Other Ambulatory Visit: Payer: Self-pay | Admitting: Internal Medicine

## 2022-05-07 DIAGNOSIS — L97526 Non-pressure chronic ulcer of other part of left foot with bone involvement without evidence of necrosis: Secondary | ICD-10-CM | POA: Diagnosis not present

## 2022-05-07 DIAGNOSIS — L97525 Non-pressure chronic ulcer of other part of left foot with muscle involvement without evidence of necrosis: Secondary | ICD-10-CM

## 2022-05-07 DIAGNOSIS — L97509 Non-pressure chronic ulcer of other part of unspecified foot with unspecified severity: Secondary | ICD-10-CM | POA: Diagnosis not present

## 2022-05-07 NOTE — Progress Notes (Signed)
  Subjective:  Patient ID: [name], male    DOB: 1960/01/25,  MRN: [id number]  Chief Complaint  Patient presents with   Foot Ulcer    Wound vac change left foot       62 y.o. male returns for post-op check.  He returns for follow-up says he had to remove the wound VAC because it was leaking and causing pain on the lateral side of the foot  Review of Systems: Negative except as noted in the HPI. Denies N/V/F/Ch.   Objective:   There were no vitals filed for this visit.  There is no height or weight on file to calculate BMI. Constitutional Well developed. Well nourished.  Vascular Foot warm and well perfused. Capillary refill normal to all digits.  Calf is soft and supple, no posterior calf or knee pain, negative Homans' sign  Neurologic Normal speech. Oriented to person, place, and time. Epicritic sensation to light touch grossly absent bilaterally.  Dermatologic Central area of deep dehiscence with residual ulceration noted, today the lateral ulcer has healed and epithelialized well, small area of spitting suture Monocryl with no signs of infection, central ulceration still present measures 1.2 x 0.6 x 0.8 cm, has hard end feel to bone now.  No cellulitis no pallor malodor or active drainage  Orthopedic: He has some sensitivity and tenderness to palpation noted about the surgical site.    Assessment:   1. Ulcer of left foot with bone involvement without evidence of necrosis     Plan:  Patient was evaluated and treated and all questions answered.  S/p foot surgery left -Wound was inspected, there are no signs of infection.  It was cleansed and thoroughly debrided of all nonviable tissue in an excisional manner with a sharp scalpel to the level of the bone.  This hard end feel was a new finding today, it did not appear to have any evidence of infection and the bone appeared healthy.  A small portion of the bone was rongeured and sent as a biopsy, a tissue culture of  the area was taken as well.  May resume antibiotics pending results of these test.  Measurements are noted above postdebridement.   Following debridement the wound was irrigated and cleansed, hemostasis achieved and Negative pressure wound therapy device was reapplied.  Set to 125 mmHg to change 3 times weekly   No follow-ups on file.

## 2022-05-09 ENCOUNTER — Telehealth: Payer: Self-pay | Admitting: *Deleted

## 2022-05-09 ENCOUNTER — Ambulatory Visit (INDEPENDENT_AMBULATORY_CARE_PROVIDER_SITE_OTHER): Payer: 59 | Admitting: *Deleted

## 2022-05-09 DIAGNOSIS — L97526 Non-pressure chronic ulcer of other part of left foot with bone involvement without evidence of necrosis: Secondary | ICD-10-CM

## 2022-05-09 NOTE — Telephone Encounter (Signed)
Called [employer] ([employer]-new name),spoke with Theodoro Kalata not available ,placed order for white foam,small(per Karalee Height) has to be a 5 case kit which is what insurance will cover. It will be delivered  on Monday-confirmation # [id number].

## 2022-05-09 NOTE — Progress Notes (Signed)
Patient presents today for wound vac application to the wound on the stump of the left foot.  He states that the lateral foot wound has been feeling better, but the top one is very sore. He said it has been more drainage than he normally sees.  Peri wound drapes applied with gray sponge cut and shaped to patient's wound. Foam cut to bridge the connector to the dorsal foot.   Vac seal was successful, no leaks. Gave instructions for keeping clean.  He will call with any concerns. He will follow up on Monday for another vac application.   Messaged Dr. Lilian Kapur and updated him on the concerns. I think patient may benefit from white foam. I had Ammie contact [employer] and order the patient some white foam.

## 2022-05-10 LAB — WOUND CULTURE
MICRO NUMBER:: 14837506
SPECIMEN QUALITY:: ADEQUATE

## 2022-05-12 ENCOUNTER — Other Ambulatory Visit: Payer: 59

## 2022-05-14 ENCOUNTER — Ambulatory Visit (INDEPENDENT_AMBULATORY_CARE_PROVIDER_SITE_OTHER): Payer: 59 | Admitting: Podiatry

## 2022-05-14 DIAGNOSIS — L97522 Non-pressure chronic ulcer of other part of left foot with fat layer exposed: Secondary | ICD-10-CM | POA: Diagnosis not present

## 2022-05-14 DIAGNOSIS — E0840 Diabetes mellitus due to underlying condition with diabetic neuropathy, unspecified: Secondary | ICD-10-CM

## 2022-05-14 NOTE — Progress Notes (Signed)
Chief Complaint  Patient presents with   Diabetes    Patient came in today for left foot diabetic ulcer, wound vac, A1c- 6.0 BG-111    Subjective:  62 y.o. male with PMHx of diabetes mellitus, h/o transmetatarsal amputation left foot (DOS: 02/11/2022 Dr. Lilian Kapur) with ulcer over the amputation stump presenting for wound VAC change.  Doing well.  No changes or no new complaints at this time   Past Medical History:  Diagnosis Date   Acute biliary pancreatitis 10/23/2013   Acute osteomyelitis of right foot 05/22/2021   AKI (acute kidney injury) 05/22/2021   Arthritis    Biliary colic 10/23/2013   Chronic osteomyelitis of toe of right foot    Diabetes mellitus    GERD (gastroesophageal reflux disease)    Headache(784.0)    History of kidney stones    Hypertension    Kidney stones    Neuropathy    Osteomyelitis of fourth toe of right foot 06/16/2019    Past Surgical History:  Procedure Laterality Date   ABDOMINAL AORTOGRAM W/LOWER EXTREMITY N/A 02/10/2022   Procedure: ABDOMINAL AORTOGRAM W/LOWER EXTREMITY;  Surgeon: Victorino Sparrow, MD;  Location: [facility] INVASIVE CV LAB;  Service: Cardiovascular;  Laterality: N/A;   ACHILLES TENDON SURGERY Right 05/26/2021   Procedure: ACHILLES LENGTHENING/KIDNER;  Surgeon: Edwin Cap, DPM;  Location: [facility] OR;  Service: Podiatry;  Laterality: Right;   ACHILLES TENDON SURGERY Left 02/11/2022   Procedure: ACHILLES LENGTHENING/KIDNER;  Surgeon: Edwin Cap, DPM;  Location: [facility] OR;  Service: Podiatry;  Laterality: Left;   AMPUTATION Left 02/11/2022   Procedure: LEFT TRANSMETATARSAL AMPUTATION;  Surgeon: Edwin Cap, DPM;  Location: [facility] OR;  Service: Podiatry;  Laterality: Left;   AMPUTATION TOE Right 06/21/2019   Procedure: AMPUTATION TOE;  Surgeon: Franky Macho, MD;  Location: [facility] ORS;  Service: General;  Laterality: Right;   AMPUTATION TOE Right 08/29/2020   Procedure: RIGHT THIRD AMPUTATION TOE;  Surgeon: Franky Macho, MD;  Location: [facility]  ORS;  Service: General;  Laterality: Right;  third toe   AMPUTATION TOE Right 02/20/2021   Procedure: AMPUTATION  TRANSMATARSAL  RIGHT; GREAT TOE;  Surgeon: Franky Macho, MD;  Location: [facility];  Service: General;  Laterality: Right;   AMPUTATION TOE Left 02/07/2022   Procedure: AMPUTATION TOE;  Surgeon: Edwin Cap, DPM;  Location: [facility] OR;  Service: Podiatry;  Laterality: Left;  amputation of remainder of Left 4th toe   BACK SURGERY  2022   CARDIAC CATHETERIZATION  01/21/2007   CHOLECYSTECTOMY N/A 10/24/2013   Procedure: LAPAROSCOPIC CHOLECYSTECTOMY;  Surgeon: Dalia Heading, MD;  Location: [facility];  Service: General;  Laterality: N/A;   COLONOSCOPY N/A 03/31/2013   Procedure: COLONOSCOPY;  Surgeon: Malissa Hippo, MD;  Location: [facility];  Service: Endoscopy;  Laterality: N/A;  730   IRRIGATION AND DEBRIDEMENT FOOT Right 04/19/2021   Procedure: IRRIGATION AND DEBRIDEMENT RIGHT SECOND TOE;  Surgeon: Franky Macho, MD;  Location: [facility];  Service: General;  Laterality: Right;   SECONDARY CLOSURE OF WOUND Right 04/19/2021   Procedure: IRRIGATION AND DEBRIDEMENT SECONDARY CLOSURE OF RIGHT GREAT TOE WOUND;  Surgeon: Franky Macho, MD;  Location: [facility];  Service: General;  Laterality: Right;   TRANSMETATARSAL AMPUTATION Right 05/24/2021   Procedure: TRANSMETATARSAL AMPUTATION;  Surgeon: Edwin Cap, DPM;  Location: [facility];  Service: Podiatry;  Laterality: Right;   TRANSMETATARSAL AMPUTATION Right 05/26/2021   Procedure: TRANSMETATARSAL AMPUTATION Revision;  Surgeon: Edwin Cap, DPM;  Location: [facility] OR;  Service: Podiatry;  Laterality: Right;    Allergies  Allergen Reactions   Latex Rash and Other (See Comments)    Redness     LT foot 05/14/2022  Objective/Physical Exam General: The patient is alert and oriented x3 in no acute distress.  Dermatology:  Ulcer noted to the amputation stump of the left forefoot measuring approximately 0.2 x 0.9 x 0.3 cm (LxWxD).  Mild maceration.   Overall well-healing ulcer with good improvement  Vascular: Abdominal aortogram w/ lower extremity, Dr. Fara Olden [facility], 02/10/2022  Neurological: Diminished via light touch  Musculoskeletal Exam: Transmetatarsal amputation left  Assessment: 1.  Ulcer left foot amputation stump 2.  Diabetes mellitus with peripheral polyneuropathy  -Patient evaluated -Negative pressure wound VAC reapplied today -Patient scheduled to return to the clinic this Friday, 05/16/2022, for wound VAC change -Follow-up next week with Dr. Perrin Smack, DPM [facility]  Dr. Felecia Shelling, DPM    [address] [place], [place] [address]                Office [phone]  Fax [phone]

## 2022-05-16 ENCOUNTER — Ambulatory Visit (INDEPENDENT_AMBULATORY_CARE_PROVIDER_SITE_OTHER): Payer: 59

## 2022-05-16 DIAGNOSIS — L97522 Non-pressure chronic ulcer of other part of left foot with fat layer exposed: Secondary | ICD-10-CM

## 2022-05-16 NOTE — Progress Notes (Signed)
Patient presents today for wound vac application to the wound on the stump of the left foot.  He states that the lateral foot wound has been feeling better.  Peri wound drapes applied with gray sponge cut and shaped to patient's wound. Foam cut to bridge the connector to the dorsal foot.   Vac seal was successful, no leaks. Gave instructions for keeping clean.  He will call with any concerns. He will follow up on Monday for another vac application.

## 2022-05-19 ENCOUNTER — Ambulatory Visit (INDEPENDENT_AMBULATORY_CARE_PROVIDER_SITE_OTHER): Payer: 59 | Admitting: *Deleted

## 2022-05-19 DIAGNOSIS — L97522 Non-pressure chronic ulcer of other part of left foot with fat layer exposed: Secondary | ICD-10-CM

## 2022-05-19 NOTE — Progress Notes (Signed)
Patient presents today for wound vac application to the wound on the stump of the left foot.  He states that the dorsal/lateral side has still been really sore. The wound on the lateral side appears to have opened back up, but very superficial.   Peri wounds draped, applied white foam to both wounds with gray sponge cut for bridging. Foam cut to bridge the connector to the dorsal foot.   Vac seal was successful, no leaks. Gave instructions for keeping clean.  He has a follow up appointment with Dr. Lilian Kapur on Wednesday for re-evaluation and wound vac application.

## 2022-05-21 ENCOUNTER — Ambulatory Visit (INDEPENDENT_AMBULATORY_CARE_PROVIDER_SITE_OTHER): Payer: 59 | Admitting: Podiatry

## 2022-05-21 ENCOUNTER — Encounter: Payer: Self-pay | Admitting: Podiatry

## 2022-05-21 ENCOUNTER — Ambulatory Visit: Payer: 59

## 2022-05-21 DIAGNOSIS — M25511 Pain in right shoulder: Secondary | ICD-10-CM | POA: Diagnosis not present

## 2022-05-21 DIAGNOSIS — L97522 Non-pressure chronic ulcer of other part of left foot with fat layer exposed: Secondary | ICD-10-CM

## 2022-05-21 NOTE — Progress Notes (Signed)
  Subjective:  Patient ID: [name], male    DOB: [birth year],  MRN: [id number]  Chief Complaint  Patient presents with   Foot Ulcer    Left foot, wound vac change       62 y.o. male returns for post-op check.  He returns for follow-up has had about 4 weeks of wound VAC therapy  Review of Systems: Negative except as noted in the HPI. Denies N/V/F/Ch.   Objective:   There were no vitals filed for this visit.  There is no height or weight on file to calculate BMI. Constitutional Well developed. Well nourished.  Vascular Foot warm and well perfused. Capillary refill normal to all digits.  Calf is soft and supple, no posterior calf or knee pain, negative Homans' sign  Neurologic Normal speech. Oriented to person, place, and time. Epicritic sensation to light touch grossly absent bilaterally.  Dermatologic Central ulceration measures 0.9 time 0.5 x 1.0 cm, much improved in dimensions since initiation of therapy, still has some depth, lateral ulceration measures 0.4 x 0.2 x 0.3 cm, no signs of infection cellulitis, mild serous drainage and bandage  Orthopedic: He has some sensitivity and tenderness to palpation noted about the surgical site.        Culture and pathology results were negative  Assessment:   1. Ulcer of left foot with fat layer exposed (HCC)     Plan:  Patient was evaluated and treated and all questions answered.  S/p foot surgery left -Wound was inspected, there are no signs of infection.  It was cleansed and thoroughly debrided of all nonviable tissue in an excisional manner with a sharp dermal curette.  Overall much improved.  We can discontinue wound VAC therapy at this point.  Recommended he transition back to Prisma collagen dressings to change daily.  This was applied today.  Postoperative measurements are noted above.  I discussed with him I would recommend continuing this for the next 2 weeks and then if we are not seeing much improvement could  consider surgical revision with excision of the sinus tract and resuturing the central portion in the operating room.  I will see him back in 1 week.  Return in about 1 week (around 05/28/2022) for wound care.

## 2022-05-22 LAB — TISSUE SPECIMEN

## 2022-05-22 LAB — PATHOLOGY REPORT

## 2022-05-23 ENCOUNTER — Other Ambulatory Visit: Payer: 59

## 2022-05-26 ENCOUNTER — Telehealth: Payer: Self-pay | Admitting: Podiatry

## 2022-05-26 DIAGNOSIS — M5416 Radiculopathy, lumbar region: Secondary | ICD-10-CM | POA: Diagnosis not present

## 2022-05-26 NOTE — Telephone Encounter (Signed)
Entered prior authorization in [facility] portal it states that no prior authorization requred for diabetic shoes and 2 toe fillers -  ref [id number]  I will submit paperwork to scan center

## 2022-05-28 DIAGNOSIS — S91301A Unspecified open wound, right foot, initial encounter: Secondary | ICD-10-CM | POA: Diagnosis not present

## 2022-05-28 DIAGNOSIS — T819XXA Unspecified complication of procedure, initial encounter: Secondary | ICD-10-CM | POA: Diagnosis not present

## 2022-05-28 DIAGNOSIS — M21961 Unspecified acquired deformity of right lower leg: Secondary | ICD-10-CM | POA: Diagnosis not present

## 2022-05-29 ENCOUNTER — Ambulatory Visit (INDEPENDENT_AMBULATORY_CARE_PROVIDER_SITE_OTHER): Payer: 59 | Admitting: Podiatry

## 2022-05-29 DIAGNOSIS — L97522 Non-pressure chronic ulcer of other part of left foot with fat layer exposed: Secondary | ICD-10-CM | POA: Diagnosis not present

## 2022-05-30 ENCOUNTER — Other Ambulatory Visit: Payer: 59

## 2022-05-30 DIAGNOSIS — G9332 Myalgic encephalomyelitis/chronic fatigue syndrome: Secondary | ICD-10-CM | POA: Diagnosis not present

## 2022-05-30 DIAGNOSIS — E1129 Type 2 diabetes mellitus with other diabetic kidney complication: Secondary | ICD-10-CM | POA: Diagnosis not present

## 2022-05-30 DIAGNOSIS — M869 Osteomyelitis, unspecified: Secondary | ICD-10-CM | POA: Diagnosis not present

## 2022-05-30 DIAGNOSIS — E782 Mixed hyperlipidemia: Secondary | ICD-10-CM | POA: Diagnosis not present

## 2022-05-30 DIAGNOSIS — M86171 Other acute osteomyelitis, right ankle and foot: Secondary | ICD-10-CM | POA: Diagnosis not present

## 2022-05-30 DIAGNOSIS — D518 Other vitamin B12 deficiency anemias: Secondary | ICD-10-CM | POA: Diagnosis not present

## 2022-05-30 DIAGNOSIS — Z0001 Encounter for general adult medical examination with abnormal findings: Secondary | ICD-10-CM | POA: Diagnosis not present

## 2022-05-30 DIAGNOSIS — M67911 Unspecified disorder of synovium and tendon, right shoulder: Secondary | ICD-10-CM | POA: Diagnosis not present

## 2022-05-30 DIAGNOSIS — E7849 Other hyperlipidemia: Secondary | ICD-10-CM | POA: Diagnosis not present

## 2022-05-30 DIAGNOSIS — I700 Atherosclerosis of aorta: Secondary | ICD-10-CM | POA: Diagnosis not present

## 2022-05-30 DIAGNOSIS — Z89432 Acquired absence of left foot: Secondary | ICD-10-CM | POA: Diagnosis not present

## 2022-05-30 DIAGNOSIS — E1142 Type 2 diabetes mellitus with diabetic polyneuropathy: Secondary | ICD-10-CM | POA: Diagnosis not present

## 2022-05-30 DIAGNOSIS — E559 Vitamin D deficiency, unspecified: Secondary | ICD-10-CM | POA: Diagnosis not present

## 2022-06-03 NOTE — Progress Notes (Signed)
  Subjective:  Patient ID: [name], male    DOB: 1960/02/12,  MRN: [id number]  Chief Complaint  Patient presents with   Foot Ulcer    Wound care left       62 y.o. male returns for post-op check.  He returns for follow-up has been applying the Prisma as directed  Review of Systems: Negative except as noted in the HPI. Denies N/V/F/Ch.   Objective:   There were no vitals filed for this visit.  There is no height or weight on file to calculate BMI. Constitutional Well developed. Well nourished.  Vascular Foot warm and well perfused. Capillary refill normal to all digits.  Calf is soft and supple, no posterior calf or knee pain, negative Homans' sign  Neurologic Normal speech. Oriented to person, place, and time. Epicritic sensation to light touch grossly absent bilaterally.  Dermatologic Central ulceration measures 0.6 x 0.2 x 0.5 cm, much improved in dimensions since initiation of therapy, still has some depth, lateral ulceration measures 0.3 x 0.2 x 0.3 cm, no signs of infection cellulitis, no drainage today  Orthopedic: He has some sensitivity and tenderness to palpation noted about the surgical site.        Culture and pathology results were negative  Assessment:   1. Ulcer of left foot with fat layer exposed (HCC)      Plan:  Patient was evaluated and treated and all questions answered.  S/p foot surgery left -Wound was inspected, there are no signs of infection.  It was cleansed and thoroughly debrided of all nonviable tissue in an excisional manner with a sharp dermal curette.  Overall much improved.  We can continue to utilize Prisma collagen dressings to change daily.  This was applied today.  Postoperative measurements are noted above.  Return in 1 week for follow-up.  If not improving could consider excision and closure of wound  Return in about 1 week (around 06/05/2022) for wound care.

## 2022-06-05 ENCOUNTER — Ambulatory Visit (INDEPENDENT_AMBULATORY_CARE_PROVIDER_SITE_OTHER): Payer: 59 | Admitting: Podiatry

## 2022-06-05 DIAGNOSIS — L97522 Non-pressure chronic ulcer of other part of left foot with fat layer exposed: Secondary | ICD-10-CM

## 2022-06-05 NOTE — Progress Notes (Signed)
  Subjective:  Patient ID: [name], male    DOB: 12-Dec-1960,  MRN: [id number]  Chief Complaint  Patient presents with   Foot Ulcer    Left foot follow up       62 y.o. male returns for post-op check.  He returns for follow-up has been applying the Prisma as directed  Review of Systems: Negative except as noted in the HPI. Denies N/V/F/Ch.   Objective:   There were no vitals filed for this visit.  There is no height or weight on file to calculate BMI. Constitutional Well developed. Well nourished.  Vascular Foot warm and well perfused. Capillary refill normal to all digits.  Calf is soft and supple, no posterior calf or knee pain, negative Homans' sign  Neurologic Normal speech. Oriented to person, place, and time. Epicritic sensation to light touch grossly absent bilaterally.  Dermatologic Wounds of healed with overlying scab.  No drainage or signs of infection  Orthopedic: He has some sensitivity and tenderness to palpation noted about the surgical site.        Culture and pathology results were negative  Assessment:   1. Ulcer of left foot with fat layer exposed (HCC)      Plan:  Patient was evaluated and treated and all questions answered.  S/p foot surgery left -Doing very well wounds have fully healed now.  We will get an update on where his shoe fillers are from [employer].  Should be able to dispense before the next visit.  May resume full weightbearing as tolerated.  May leave feet open to air.  Advised to notify me if there is any signs and symptoms of new ulceration or recurrence of infection.  Once he has his amputation filler is likely would benefit from physical therapy to help with ambulation and strengthening and conditioning from bilateral TMA's.  He will follow-up my partner in 1 month and then see me in 2 months.  Return in about 1 month (around 07/06/2022) for follow up on wound check, shoe filler bilateral TMAs.

## 2022-06-06 NOTE — Telephone Encounter (Signed)
Patient called upset and yelling that he wants to know when he is getting his diabetic shoes and inserts. I tried to explain to [name] that it takes time to get the diabetic paperwork back from his provider, he said he doesn't like the way he is being treated and if we can get him his shoes and inserts right now he will go elsewhere to get them.

## 2022-06-18 NOTE — Telephone Encounter (Signed)
Patient called this morning with [employer] on the line, cussing and screaming at me because we do not have his diabetic shoes and toe fillers.

## 2022-06-27 DIAGNOSIS — I700 Atherosclerosis of aorta: Secondary | ICD-10-CM | POA: Diagnosis not present

## 2022-06-27 DIAGNOSIS — E1140 Type 2 diabetes mellitus with diabetic neuropathy, unspecified: Secondary | ICD-10-CM | POA: Diagnosis not present

## 2022-06-27 DIAGNOSIS — E1129 Type 2 diabetes mellitus with other diabetic kidney complication: Secondary | ICD-10-CM | POA: Diagnosis not present

## 2022-06-27 DIAGNOSIS — M869 Osteomyelitis, unspecified: Secondary | ICD-10-CM | POA: Diagnosis not present

## 2022-06-27 DIAGNOSIS — I10 Essential (primary) hypertension: Secondary | ICD-10-CM | POA: Diagnosis not present

## 2022-06-27 DIAGNOSIS — Z89432 Acquired absence of left foot: Secondary | ICD-10-CM | POA: Diagnosis not present

## 2022-06-27 DIAGNOSIS — E1142 Type 2 diabetes mellitus with diabetic polyneuropathy: Secondary | ICD-10-CM | POA: Diagnosis not present

## 2022-06-27 DIAGNOSIS — M4306 Spondylolysis, lumbar region: Secondary | ICD-10-CM | POA: Diagnosis not present

## 2022-07-01 ENCOUNTER — Telehealth: Payer: Self-pay | Admitting: Podiatry

## 2022-07-01 NOTE — Telephone Encounter (Signed)
Pt called and stated he was returning a call but no one had left a message.  Upon checking pt needs to be re scanned for the inserts and I did offer an appt to come in this morning or he could do it on 6.13 at his next appt. Pt stated he was at an appt today so he would just do it when he came in for his next follow up appt on 6.13.2024. I did apologize for the inconvenience.

## 2022-07-02 ENCOUNTER — Telehealth: Payer: Self-pay | Admitting: Podiatry

## 2022-07-02 NOTE — Telephone Encounter (Signed)
Received message yesterday at 127pm from Selena Batten at [employer] and she was wanting to make sure we received the quality of care form they faxed. Please reference number [id number].[name]  Returned call and left message for kim that we have received the form and are working on it.

## 2022-07-03 ENCOUNTER — Ambulatory Visit (INDEPENDENT_AMBULATORY_CARE_PROVIDER_SITE_OTHER): Payer: 59 | Admitting: Podiatry

## 2022-07-03 DIAGNOSIS — Z89432 Acquired absence of left foot: Secondary | ICD-10-CM | POA: Diagnosis not present

## 2022-07-03 DIAGNOSIS — L84 Corns and callosities: Secondary | ICD-10-CM

## 2022-07-03 DIAGNOSIS — Z89431 Acquired absence of right foot: Secondary | ICD-10-CM | POA: Diagnosis not present

## 2022-07-03 NOTE — Progress Notes (Signed)
Subjective: Chief Complaint  Patient presents with   Wound Check    Wound check. Wound is fully healed.    Foot Orthotics    Shoe filler bilateral TMAs. 4 wks/ remold for inserts   62 year old male presents the office with above concerns.  States he is doing well no new ulcerations reports.  He has no new concerns.  115 last BS  Objective: AAO x3, NAD DP/PT pulses palpable bilaterally, CRT less than 3 seconds Bilateral TMA On the lateral portion of the incision on the left foot small amount of callus formation upon debridement there is no underlying ulceration, drainage or any signs of infection of this area today.  No edema, erythema. 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  -Sharply debrided hyperkeratotic lesion without any complications or bleeding.  Recommend moisturizer daily and monitor for any signs or symptoms of infection or skin breakdown. -He was re-measured for inserts today -Patient encouraged to call the office with any questions, concerns, change in symptoms.   Vivi Barrack DPM

## 2022-07-15 ENCOUNTER — Ambulatory Visit (INDEPENDENT_AMBULATORY_CARE_PROVIDER_SITE_OTHER): Payer: 59 | Admitting: Podiatry

## 2022-07-15 ENCOUNTER — Telehealth: Payer: Self-pay | Admitting: Podiatry

## 2022-07-15 DIAGNOSIS — L84 Corns and callosities: Secondary | ICD-10-CM | POA: Diagnosis not present

## 2022-07-15 DIAGNOSIS — Z89431 Acquired absence of right foot: Secondary | ICD-10-CM | POA: Diagnosis not present

## 2022-07-15 DIAGNOSIS — Z89432 Acquired absence of left foot: Secondary | ICD-10-CM | POA: Diagnosis not present

## 2022-07-15 DIAGNOSIS — E0840 Diabetes mellitus due to underlying condition with diabetic neuropathy, unspecified: Secondary | ICD-10-CM | POA: Diagnosis not present

## 2022-07-15 NOTE — Progress Notes (Signed)
Patient presents today to pick up diabetic shoes and 2 toe filler insoles.  Patient was dispensed 1 pair of diabetic shoes and 2 toe filler f. Fit was satisfactory, after we cut the 2 toe fillers back a quarter of inch and angled them so that they werent rubbing on pts stump. Instructions for break-in and wear was reviewed and a copy was given to the patient.   Re-appointment for regularly scheduled diabetic foot care visits or if they should experience any trouble with the shoes or insoles.

## 2022-07-15 NOTE — Telephone Encounter (Signed)
Lmom for pt to call to pick up diabetic shoes

## 2022-07-17 ENCOUNTER — Ambulatory Visit: Payer: Medicare Other | Admitting: Podiatry

## 2022-07-21 DIAGNOSIS — T819XXA Unspecified complication of procedure, initial encounter: Secondary | ICD-10-CM | POA: Diagnosis not present

## 2022-07-21 DIAGNOSIS — S91301A Unspecified open wound, right foot, initial encounter: Secondary | ICD-10-CM | POA: Diagnosis not present

## 2022-07-21 DIAGNOSIS — M21961 Unspecified acquired deformity of right lower leg: Secondary | ICD-10-CM | POA: Diagnosis not present

## 2022-07-28 ENCOUNTER — Other Ambulatory Visit: Payer: 59

## 2022-07-30 DIAGNOSIS — M67911 Unspecified disorder of synovium and tendon, right shoulder: Secondary | ICD-10-CM | POA: Diagnosis not present

## 2022-07-30 DIAGNOSIS — E1142 Type 2 diabetes mellitus with diabetic polyneuropathy: Secondary | ICD-10-CM | POA: Diagnosis not present

## 2022-07-30 DIAGNOSIS — Z89432 Acquired absence of left foot: Secondary | ICD-10-CM | POA: Diagnosis not present

## 2022-07-30 DIAGNOSIS — I700 Atherosclerosis of aorta: Secondary | ICD-10-CM | POA: Diagnosis not present

## 2022-07-30 DIAGNOSIS — G894 Chronic pain syndrome: Secondary | ICD-10-CM | POA: Diagnosis not present

## 2022-07-30 DIAGNOSIS — I10 Essential (primary) hypertension: Secondary | ICD-10-CM | POA: Diagnosis not present

## 2022-07-30 DIAGNOSIS — M4306 Spondylolysis, lumbar region: Secondary | ICD-10-CM | POA: Diagnosis not present

## 2022-07-30 DIAGNOSIS — M86171 Other acute osteomyelitis, right ankle and foot: Secondary | ICD-10-CM | POA: Diagnosis not present

## 2022-07-30 DIAGNOSIS — E1140 Type 2 diabetes mellitus with diabetic neuropathy, unspecified: Secondary | ICD-10-CM | POA: Diagnosis not present

## 2022-07-30 DIAGNOSIS — E1129 Type 2 diabetes mellitus with other diabetic kidney complication: Secondary | ICD-10-CM | POA: Diagnosis not present

## 2022-08-12 ENCOUNTER — Ambulatory Visit (INDEPENDENT_AMBULATORY_CARE_PROVIDER_SITE_OTHER): Payer: 59 | Admitting: Podiatry

## 2022-08-12 DIAGNOSIS — R269 Unspecified abnormalities of gait and mobility: Secondary | ICD-10-CM

## 2022-08-12 DIAGNOSIS — Z89432 Acquired absence of left foot: Secondary | ICD-10-CM | POA: Diagnosis not present

## 2022-08-12 DIAGNOSIS — Z89431 Acquired absence of right foot: Secondary | ICD-10-CM | POA: Diagnosis not present

## 2022-08-13 NOTE — Progress Notes (Signed)
  Subjective:  Patient ID: [name], male    DOB: December 12, 1960,  MRN: [id number]  Chief Complaint  Patient presents with   Routine Post Op    Pt is here for 1 year follow up on amputation and diabetic foot exam       61 y.o. male returns for post-op check.  He returns for follow-up is wearing regular shoe gear and using his inserts  Review of Systems: Negative except as noted in the HPI. Denies N/V/F/Ch.   Objective:   There were no vitals filed for this visit.  There is no height or weight on file to calculate BMI. Constitutional Well developed. Well nourished.  Vascular Foot warm and well perfused. Capillary refill normal to all digits.  Calf is soft and supple, no posterior calf or knee pain, negative Homans' sign  Neurologic Normal speech. Oriented to person, place, and time. Epicritic sensation to light touch grossly absent bilaterally.  Dermatologic Wounds are completely healed some hyperkeratosis  Orthopedic: Pain greatly improved, occasional soreness, position is rectus        Culture and pathology results were negative  Assessment:   1. Gait disturbance   2. Status post transmetatarsal amputation of foot, right (HCC)   3. Status post transmetatarsal amputation of foot, left (HCC)      Plan:  Patient was evaluated and treated and all questions answered.  S/p foot surgery left -Overall doing okay he is having some issues with balance and ambulation I recommended physical therapy and order for this was placed.  Wounds remain healed and he may continue WBAT in regular shoe gear with his shoe fillers.  We discussed long-term of balance and ambulation as an issue and AFO may offer added support.  I will see him back in 2 months to reevaluate.  Return in about 2 months (around 10/13/2022) for f/u after PT.

## 2022-08-14 ENCOUNTER — Ambulatory Visit: Payer: 59 | Admitting: Podiatry

## 2022-08-25 DIAGNOSIS — R5383 Other fatigue: Secondary | ICD-10-CM | POA: Diagnosis not present

## 2022-08-27 DIAGNOSIS — E1129 Type 2 diabetes mellitus with other diabetic kidney complication: Secondary | ICD-10-CM | POA: Diagnosis not present

## 2022-08-27 DIAGNOSIS — E1140 Type 2 diabetes mellitus with diabetic neuropathy, unspecified: Secondary | ICD-10-CM | POA: Diagnosis not present

## 2022-08-27 DIAGNOSIS — G894 Chronic pain syndrome: Secondary | ICD-10-CM | POA: Diagnosis not present

## 2022-08-27 DIAGNOSIS — M4306 Spondylolysis, lumbar region: Secondary | ICD-10-CM | POA: Diagnosis not present

## 2022-08-27 DIAGNOSIS — I10 Essential (primary) hypertension: Secondary | ICD-10-CM | POA: Diagnosis not present

## 2022-08-27 DIAGNOSIS — E1142 Type 2 diabetes mellitus with diabetic polyneuropathy: Secondary | ICD-10-CM | POA: Diagnosis not present

## 2022-08-28 DIAGNOSIS — T819XXA Unspecified complication of procedure, initial encounter: Secondary | ICD-10-CM | POA: Diagnosis not present

## 2022-08-28 DIAGNOSIS — S91301A Unspecified open wound, right foot, initial encounter: Secondary | ICD-10-CM | POA: Diagnosis not present

## 2022-08-28 DIAGNOSIS — M21961 Unspecified acquired deformity of right lower leg: Secondary | ICD-10-CM | POA: Diagnosis not present

## 2022-09-01 DIAGNOSIS — M5416 Radiculopathy, lumbar region: Secondary | ICD-10-CM | POA: Diagnosis not present

## 2022-09-18 ENCOUNTER — Other Ambulatory Visit: Payer: Self-pay

## 2022-09-18 ENCOUNTER — Ambulatory Visit (HOSPITAL_COMMUNITY): Payer: 59 | Attending: Podiatry | Admitting: Physical Therapy

## 2022-09-18 DIAGNOSIS — Z89432 Acquired absence of left foot: Secondary | ICD-10-CM | POA: Diagnosis not present

## 2022-09-18 DIAGNOSIS — R269 Unspecified abnormalities of gait and mobility: Secondary | ICD-10-CM | POA: Diagnosis not present

## 2022-09-18 DIAGNOSIS — R262 Difficulty in walking, not elsewhere classified: Secondary | ICD-10-CM | POA: Diagnosis not present

## 2022-09-18 DIAGNOSIS — M6281 Muscle weakness (generalized): Secondary | ICD-10-CM | POA: Insufficient documentation

## 2022-09-18 DIAGNOSIS — Z89431 Acquired absence of right foot: Secondary | ICD-10-CM | POA: Insufficient documentation

## 2022-09-18 NOTE — Therapy (Signed)
OUTPATIENT PHYSICAL THERAPY LOWER EXTREMITY EVALUATION   Patient Name: [name] MRN: [id number] DOB:[birth year], 61 y.o., male Today's Date: 09/18/2022  END OF SESSION:  PT End of Session - 09/18/22 1314     Visit Number 1    Number of Visits 8    Date for PT Re-Evaluation 10/18/22    Authorization Type [facility] medicare auth put in    Progress Note Due on Visit 8    PT Start Time 1120    PT Stop Time 1203    PT Time Calculation (min) 43 min    Activity Tolerance Patient tolerated treatment well    Behavior During Therapy [facility] for tasks assessed/performed             Past Medical History:  Diagnosis Date   Acute biliary pancreatitis 10/23/2013   Acute osteomyelitis of right foot (HCC) 05/22/2021   AKI (acute kidney injury) (HCC) 05/22/2021   Arthritis    Biliary colic 10/23/2013   Chronic osteomyelitis of toe of right foot (HCC)    Diabetes mellitus    GERD (gastroesophageal reflux disease)    Headache(784.0)    History of kidney stones    Hypertension    Kidney stones    Neuropathy    Osteomyelitis of fourth toe of right foot (HCC) 06/16/2019   Past Surgical History:  Procedure Laterality Date   ABDOMINAL AORTOGRAM W/LOWER EXTREMITY N/A 02/10/2022   Procedure: ABDOMINAL AORTOGRAM W/LOWER EXTREMITY;  Surgeon: Victorino Sparrow, MD;  Location: [facility] INVASIVE CV LAB;  Service: Cardiovascular;  Laterality: N/A;   ACHILLES TENDON SURGERY Right 05/26/2021   Procedure: ACHILLES LENGTHENING/KIDNER;  Surgeon: Edwin Cap, DPM;  Location: [facility] OR;  Service: Podiatry;  Laterality: Right;   ACHILLES TENDON SURGERY Left 02/11/2022   Procedure: ACHILLES LENGTHENING/KIDNER;  Surgeon: Edwin Cap, DPM;  Location: [facility] OR;  Service: Podiatry;  Laterality: Left;   AMPUTATION Left 02/11/2022   Procedure: LEFT TRANSMETATARSAL AMPUTATION;  Surgeon: Edwin Cap, DPM;  Location: [facility] OR;  Service: Podiatry;  Laterality: Left;   AMPUTATION TOE Right 06/21/2019   Procedure:  AMPUTATION TOE;  Surgeon: Franky Macho, MD;  Location: [facility]S;  Service: General;  Laterality: Right;   AMPUTATION TOE Right 08/29/2020   Procedure: RIGHT THIRD AMPUTATION TOE;  Surgeon: Franky Macho, MD;  Location: [facility];  Service: General;  Laterality: Right;  third toe   AMPUTATION TOE Right 02/20/2021   Procedure: AMPUTATION  TRANSMATARSAL  RIGHT; GREAT TOE;  Surgeon: Franky Macho, MD;  Location: [facility];  Service: General;  Laterality: Right;   AMPUTATION TOE Left 02/07/2022   Procedure: AMPUTATION TOE;  Surgeon: Edwin Cap, DPM;  Location: [facility] OR;  Service: Podiatry;  Laterality: Left;  amputation of remainder of Left 4th toe   BACK SURGERY  2022   CARDIAC CATHETERIZATION  01/21/2007   CHOLECYSTECTOMY N/A 10/24/2013   Procedure: LAPAROSCOPIC CHOLECYSTECTOMY;  Surgeon: Dalia Heading, MD;  Location: [facility];  Service: General;  Laterality: N/A;   COLONOSCOPY N/A 03/31/2013   Procedure: COLONOSCOPY;  Surgeon: Malissa Hippo, MD;  Location: [facility];  Service: Endoscopy;  Laterality: N/A;  730   IRRIGATION AND DEBRIDEMENT FOOT Right 04/19/2021   Procedure: IRRIGATION AND DEBRIDEMENT RIGHT SECOND TOE;  Surgeon: Franky Macho, MD;  Location: [facility];  Service: General;  Laterality: Right;   SECONDARY CLOSURE OF WOUND Right 04/19/2021   Procedure: IRRIGATION AND DEBRIDEMENT SECONDARY CLOSURE OF RIGHT GREAT TOE WOUND;  Surgeon: Franky Macho, MD;  Location:  [facility];  Service: General;  Laterality: Right;   TRANSMETATARSAL AMPUTATION Right 05/24/2021   Procedure: TRANSMETATARSAL AMPUTATION;  Surgeon: Edwin Cap, DPM;  Location: [facility] OR;  Service: Podiatry;  Laterality: Right;   TRANSMETATARSAL AMPUTATION Right 05/26/2021   Procedure: TRANSMETATARSAL AMPUTATION Revision;  Surgeon: Edwin Cap, DPM;  Location: [facility] OR;  Service: Podiatry;  Laterality: Right;   Patient Active Problem List   Diagnosis Date Noted   Medication monitoring encounter 02/28/2022   Equinus contracture of left  ankle 02/12/2022   Microcytic anemia 02/06/2022   Hypokalemia 02/06/2022   Abnormal surgical wound    Deformity of metatarsal bone of right foot    Open wound of right foot    Anxiety 05/22/2021   Diabetic foot infection (HCC) 08/27/2020   Spondylolisthesis of lumbar region 10/12/2019   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) 06/16/2019   Spinal stenosis of lumbar region 03/10/2018   Esophageal ulcer without bleeding 08/26/2010   HTN (hypertension) 08/26/2010   Controlled IDDM-2 with hyperglycemia 08/25/2010   Chronic pain 08/25/2010    PCP: Elfredia Nevins   REFERRING PROVIDER: Edwin Cap, DPM  REFERRING DIAG:  R26.9 (ICD-10-CM) - Gait disturbance  [phone] (ICD-10-CM) - Status post transmetatarsal amputation of foot, right (HCC)  E45.409 (ICD-10-CM) - Status post transmetatarsal amputation of foot, left (HCC)    THERAPY DIAG:  Muscle weakness (generalized)  Difficulty in walking, not elsewhere classified  Rationale for Evaluation and Treatment: Rehabilitation  ONSET DATE: January 2024    SUBJECTIVE STATEMENT: Pt states that he is walking with a walking stick.  He can walk for about 10 minutes and then he wants to sit down.  He wears out by the end of the day.    PERTINENT HISTORY: B transmetatarsal amputation, 2 back surgeries.  PAIN:  Are you having pain? Yes: NPRS scale: 4/10 Pain location: both feet  Pain description: pins and needles Aggravating factors: no known reason  Relieving factors: medication that he rubs on his feet  Back pain is a 7/10 but pt is not here for this   PRECAUTIONS: None  WEIGHT BEARING RESTRICTIONS: No  FALLS:  Has patient fallen in last 6 months? No  LIVING ENVIRONMENT: Lives with: lives alone Lives in: House/apartment Stairs: No Has following equipment at home:  walking stick   OCCUPATION: disability  PLOF: Independent with basic ADLs and Independent with household mobility with device  PATIENT GOALS:  To be able to take care chickens   NEXT MD VISIT: unknown  OBJECTIVE:   DIAGNOSTIC FINDINGS: none   COGNITION: Overall cognitive status: Within functional limits for tasks assessed     POSTURE: No Significant postural limitations   LOWER EXTREMITY ROM: WFL    LOWER EXTREMITY MMT:  MMT Right eval Left eval  Hip flexion 5 5  Hip extension 4 4  Hip abduction 5 5  Hip adduction    Hip internal rotation    Hip external rotation    Knee flexion 5 5`  Knee extension 5 5  Ankle dorsiflexion 4+ 1  Ankle plantarflexion 2 2  Ankle inversion    Ankle eversion     (Blank rows = not tested)    FUNCTIONAL TESTS:  30 second sit to stand:  unable without UE assist 8; 9 is poor for age and sex  2 minute walk test: 246 ft with walking stick  Single leg stance:  0 rt ; 0 left      TODAY'S TREATMENT:  DATE: 09/18/22:  Evaluation Sitting ankle dorsiflexion/plantarflexion x 10    PATIENT EDUCATION:  Education details: HEP Person educated: Patient Education method: Explanation, Verbal cues, and Handouts Education comprehension: returned demonstration  HOME EXERCISE PROGRAM:  Access Code: [email or web address] URL: [email or web address] Date: 09/18/2022 Prepared by: Virgina Organ  Exercises - Seated Toe Raise  - 5 x daily - 7 x weekly - 1 sets - 10 reps - 5" hold - Seated Heel Raise  - 5 x daily - 7 x weekly - 1 sets - 10 reps - 5" hold ASSESSMENT:  CLINICAL IMPRESSION: Patient is a 62 y.o. male who was seen today for physical therapy evaluation and treatment for evaluation and treatment for gait disturbance.  Evaluation demonstrates B transmetatarsal amputations, decreased balance, decreased strength.  Mr. Rubendall does not have prosthetics for his feet, a prescription will be sent to the MD.   The pt has decreased strength and decreased balance  he will benefit from skilled PT to address these deficits to improve his gait.   OBJECTIVE IMPAIRMENTS: Abnormal gait, decreased activity tolerance, decreased balance, difficulty walking, decreased strength, and pain.   ACTIVITY LIMITATIONS: carrying, lifting, standing, squatting, stairs, and locomotion level  PARTICIPATION LIMITATIONS: meal prep, cleaning, shopping, community activity, and yard work   [facility] POTENTIAL: Good  CLINICAL DECISION MAKING: Stable/uncomplicated  EVALUATION COMPLEXITY: Moderate   GOALS: Goals reviewed with patient? Yes  SHORT TERM GOALS: Target date: 10/02/22 Pt to be I in HEP in order to improve his mobility Baseline: Goal status: INITIAL  2.  PT to increase his strength to be able to come sit to stand without UE assist  Baseline:  Goal status: INITIAL  3.  PT to be able to single leg stance on each foot x 10 seconds to reduce risk of fall  Baseline:  Goal status: INITIAL  4.  Pt to have seen orthotist Baseline:  Goal status: INITIAL    LONG TERM GOALS: Target date: 10/16/22  Pt to be I in HEP in order to improve his gait to be able to walk 300 ft in 2 minutes  Baseline:  Goal status: INITIAL  2.   PT to increase his strength to be able to come sit to stand without UE assist 12 x in 30 seconds  Baseline:  Goal status: INITIAL  3.  PT to be able to single leg stance on each foot x 15 seconds to reduce risk of fall  Baseline:  Goal status: INITIAL  4.  Pt to have acquired prothesis for feet Baseline:  Goal status: INITIAL    PLAN:  PT FREQUENCY: 2x/week  PT DURATION: 4 weeks  PLANNED INTERVENTIONS: Therapeutic exercises, Therapeutic activity, Neuromuscular re-education, Balance training, Gait training, Patient/Family education, and Self Care  PLAN FOR NEXT SESSION: begin balance activity as well as strengthening    Virgina Organ, PT CLT [phone]  09/18/2022, 1:15 PM

## 2022-09-24 ENCOUNTER — Ambulatory Visit (HOSPITAL_COMMUNITY): Payer: 59 | Admitting: Physical Therapy

## 2022-09-24 ENCOUNTER — Encounter (HOSPITAL_COMMUNITY): Payer: Self-pay

## 2022-09-25 DIAGNOSIS — E1142 Type 2 diabetes mellitus with diabetic polyneuropathy: Secondary | ICD-10-CM | POA: Diagnosis not present

## 2022-09-25 DIAGNOSIS — I700 Atherosclerosis of aorta: Secondary | ICD-10-CM | POA: Diagnosis not present

## 2022-09-25 DIAGNOSIS — I10 Essential (primary) hypertension: Secondary | ICD-10-CM | POA: Diagnosis not present

## 2022-09-25 DIAGNOSIS — Z89432 Acquired absence of left foot: Secondary | ICD-10-CM | POA: Diagnosis not present

## 2022-09-25 DIAGNOSIS — E1129 Type 2 diabetes mellitus with other diabetic kidney complication: Secondary | ICD-10-CM | POA: Diagnosis not present

## 2022-09-25 DIAGNOSIS — M4306 Spondylolysis, lumbar region: Secondary | ICD-10-CM | POA: Diagnosis not present

## 2022-09-25 DIAGNOSIS — E1140 Type 2 diabetes mellitus with diabetic neuropathy, unspecified: Secondary | ICD-10-CM | POA: Diagnosis not present

## 2022-09-25 DIAGNOSIS — M86171 Other acute osteomyelitis, right ankle and foot: Secondary | ICD-10-CM | POA: Diagnosis not present

## 2022-09-26 ENCOUNTER — Encounter (HOSPITAL_COMMUNITY): Payer: 59 | Admitting: Physical Therapy

## 2022-09-26 NOTE — Therapy (Deleted)
OUTPATIENT PHYSICAL THERAPY LOWER EXTREMITY TREATMENT   Patient Name: [name] MRN: [id number] DOB:1960-03-20, 62 y.o., male Today's Date: 09/26/2022  END OF SESSION:    Past Medical History:  Diagnosis Date   Acute biliary pancreatitis 10/23/2013   Acute osteomyelitis of right foot (HCC) 05/22/2021   AKI (acute kidney injury) (HCC) 05/22/2021   Arthritis    Biliary colic 10/23/2013   Chronic osteomyelitis of toe of right foot (HCC)    Diabetes mellitus    GERD (gastroesophageal reflux disease)    Headache(784.0)    History of kidney stones    Hypertension    Kidney stones    Neuropathy    Osteomyelitis of fourth toe of right foot (HCC) 06/16/2019   Past Surgical History:  Procedure Laterality Date   ABDOMINAL AORTOGRAM W/LOWER EXTREMITY N/A 02/10/2022   Procedure: ABDOMINAL AORTOGRAM W/LOWER EXTREMITY;  Surgeon: Victorino Sparrow, MD;  Location: [facility] INVASIVE CV LAB;  Service: Cardiovascular;  Laterality: N/A;   ACHILLES TENDON SURGERY Right 05/26/2021   Procedure: ACHILLES LENGTHENING/KIDNER;  Surgeon: Edwin Cap, DPM;  Location: [facility] OR;  Service: Podiatry;  Laterality: Right;   ACHILLES TENDON SURGERY Left 02/11/2022   Procedure: ACHILLES LENGTHENING/KIDNER;  Surgeon: Edwin Cap, DPM;  Location: [facility] OR;  Service: Podiatry;  Laterality: Left;   AMPUTATION Left 02/11/2022   Procedure: LEFT TRANSMETATARSAL AMPUTATION;  Surgeon: Edwin Cap, DPM;  Location: [facility] OR;  Service: Podiatry;  Laterality: Left;   AMPUTATION TOE Right 06/21/2019   Procedure: AMPUTATION TOE;  Surgeon: Franky Macho, MD;  Location: [facility];  Service: General;  Laterality: Right;   AMPUTATION TOE Right 08/29/2020   Procedure: RIGHT THIRD AMPUTATION TOE;  Surgeon: Franky Macho, MD;  Location: [facility];  Service: General;  Laterality: Right;  third toe   AMPUTATION TOE Right 02/20/2021   Procedure: AMPUTATION  TRANSMATARSAL  RIGHT; GREAT TOE;  Surgeon: Franky Macho, MD;  Location: [facility];   Service: General;  Laterality: Right;   AMPUTATION TOE Left 02/07/2022   Procedure: AMPUTATION TOE;  Surgeon: Edwin Cap, DPM;  Location: [facility] OR;  Service: Podiatry;  Laterality: Left;  amputation of remainder of Left 4th toe   BACK SURGERY  2022   CARDIAC CATHETERIZATION  01/21/2007   CHOLECYSTECTOMY N/A 10/24/2013   Procedure: LAPAROSCOPIC CHOLECYSTECTOMY;  Surgeon: Dalia Heading, MD;  Location: [facility];  Service: General;  Laterality: N/A;   COLONOSCOPY N/A 03/31/2013   Procedure: COLONOSCOPY;  Surgeon: Malissa Hippo, MD;  [facility]: [facility];  Service: Endoscopy;  Laterality: N/A;  730   IRRIGATION AND DEBRIDEMENT FOOT Right 04/19/2021   Procedure: IRRIGATION AND DEBRIDEMENT RIGHT SECOND TOE;  Surgeon: Franky Macho, MD;  Location: [facility];  Service: General;  Laterality: Right;   SECONDARY CLOSURE OF WOUND Right 04/19/2021   Procedure: IRRIGATION AND DEBRIDEMENT SECONDARY CLOSURE OF RIGHT GREAT TOE WOUND;  Surgeon: Franky Macho, MD;  Location: [facility];  Service: General;  Laterality: Right;   TRANSMETATARSAL AMPUTATION Right 05/24/2021   Procedure: TRANSMETATARSAL AMPUTATION;  Surgeon: Edwin Cap, DPM;  Location: [facility] OR;  Service: Podiatry;  Laterality: Right;   TRANSMETATARSAL AMPUTATION Right 05/26/2021   Procedure: TRANSMETATARSAL AMPUTATION Revision;  Surgeon: Edwin Cap, DPM;  Location: [facility] OR;  Service: Podiatry;  Laterality: Right;   Patient Active Problem List   Diagnosis Date Noted   Medication monitoring encounter 02/28/2022   Equinus contracture of left ankle 02/12/2022   Microcytic anemia 02/06/2022   Hypokalemia 02/06/2022   Abnormal  surgical wound    Deformity of metatarsal bone of right foot    Open wound of right foot    Anxiety 05/22/2021   Diabetic foot infection (HCC) 08/27/2020   Spondylolisthesis of lumbar region 10/12/2019   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  Rationale for Evaluation and Treatment: Rehabilitation  ONSET DATE: January 2024    SUBJECTIVE STATEMENT: *** Pt states that he is walking with a walking stick.  He can walk for about 10 minutes and then he wants to sit down.  He wears out by the end of the day.    PERTINENT HISTORY: B transmetatarsal amputation, 2 back surgeries.  PAIN:  Are you having pain? Yes: NPRS scale: 4/10 Pain location: both feet  Pain description: pins and needles Aggravating factors: no known reason  Relieving factors: medication that he rubs on his feet  Back pain is a 7/10 but pt is not here for this   PRECAUTIONS: None  WEIGHT BEARING RESTRICTIONS: No  FALLS:  Has patient fallen in last 6 months? No  LIVING ENVIRONMENT: Lives with: lives alone Lives in: House/apartment Stairs: No Has following equipment at home:  walking stick   OCCUPATION: disability  PLOF: Independent with basic ADLs and Independent with household mobility with device  PATIENT GOALS: To be able to take care chickens   NEXT MD VISIT: unknown  OBJECTIVE:   DIAGNOSTIC FINDINGS: none   COGNITION: Overall cognitive status: Within functional limits for tasks assessed     POSTURE: No Significant postural limitations   LOWER EXTREMITY ROM: WFL    LOWER EXTREMITY MMT:  MMT Right eval Left eval  Hip flexion 5 5  Hip extension 4 4  Hip abduction 5 5  Hip adduction    Hip internal rotation    Hip external rotation    Knee flexion 5 5`   Knee extension 5 5  Ankle dorsiflexion 4+ 1  Ankle plantarflexion 2 2  Ankle inversion    Ankle eversion     (Blank rows = not tested)    FUNCTIONAL TESTS:  30 second sit to stand:  unable without UE assist 8; 9 is poor for age and sex  2 minute walk test: 246 ft with walking stick  Single leg stance:  0 rt ; 0 left      TODAY'S TREATMENT:  DATE:  09/26/22: ***  09/18/22:  Evaluation Sitting ankle dorsiflexion/plantarflexion x 10    PATIENT EDUCATION:  Education details: HEP Person educated: Patient Education method: Explanation, Verbal cues, and Handouts Education comprehension: returned demonstration  HOME EXERCISE PROGRAM:  Access Code: [email or web address] URL: [email or web address] Date: 09/18/2022 Prepared by: Virgina Organ  Exercises - Seated Toe Raise  - 5 x daily - 7 x weekly - 1 sets - 10 reps - 5" hold - Seated Heel Raise  - 5 x daily - 7 x weekly - 1 sets - 10 reps - 5" hold ASSESSMENT:  CLINICAL IMPRESSION: *** Patient is a 62 y.o. male who was seen today for physical therapy evaluation and treatment for evaluation and treatment for gait disturbance.  Evaluation demonstrates B transmetatarsal amputations, decreased balance, decreased strength.  Mr. Stoltzfoos does not have prosthetics for his feet, a prescription will be sent to the MD.   The pt has decreased strength and decreased balance he will benefit from skilled PT to address these deficits to improve his gait.   OBJECTIVE IMPAIRMENTS: Abnormal gait, decreased activity tolerance, decreased balance, difficulty walking, decreased strength, and pain.   ACTIVITY LIMITATIONS: carrying, lifting, standing, squatting, stairs, and locomotion level  PARTICIPATION LIMITATIONS: meal prep, cleaning, shopping, community activity, and yard work   [facility] POTENTIAL: Good  CLINICAL  DECISION MAKING: Stable/uncomplicated  EVALUATION COMPLEXITY: Moderate   GOALS: Goals reviewed with patient? Yes  SHORT TERM GOALS: Target date: 10/02/22 Pt to be I in HEP in order to improve his mobility Baseline: Goal status: INITIAL  2.  PT to increase his strength to be able to come sit to stand without UE assist  Baseline:  Goal status: INITIAL  3.  PT to be able to single leg stance on each foot x 10 seconds to reduce risk of fall  Baseline:  Goal status: INITIAL  4.  Pt to have seen orthotist Baseline:  Goal status: INITIAL    LONG TERM GOALS: Target date: 10/16/22  Pt to be I in HEP in order to improve his gait to be able to walk 300 ft in 2 minutes  Baseline:  Goal status: INITIAL  2.   PT to increase his strength to be able to come sit to stand without UE assist 12 x in 30 seconds  Baseline:  Goal status: INITIAL  3.  PT to be able to single leg stance on each foot x 15 seconds to reduce risk of fall  Baseline:  Goal status: INITIAL  4.  Pt to have acquired prothesis for feet Baseline:  Goal status: INITIAL    PLAN:  PT FREQUENCY: 2x/week  PT DURATION: 4 weeks  PLANNED INTERVENTIONS: Therapeutic exercises, Therapeutic activity, Neuromuscular re-education, Balance training, Gait training, Patient/Family education, and Self Care  PLAN FOR NEXT SESSION: begin balance activity as well as strengthening    Zada Haser April Ma L Zelma Snead, PT 09/26/2022, 8:10 AM

## 2022-09-28 DIAGNOSIS — S91301A Unspecified open wound, right foot, initial encounter: Secondary | ICD-10-CM | POA: Diagnosis not present

## 2022-09-28 DIAGNOSIS — M21961 Unspecified acquired deformity of right lower leg: Secondary | ICD-10-CM | POA: Diagnosis not present

## 2022-09-28 DIAGNOSIS — T819XXA Unspecified complication of procedure, initial encounter: Secondary | ICD-10-CM | POA: Diagnosis not present

## 2022-09-29 ENCOUNTER — Ambulatory Visit (HOSPITAL_COMMUNITY): Payer: 59 | Attending: Podiatry | Admitting: Physical Therapy

## 2022-09-29 ENCOUNTER — Encounter (HOSPITAL_COMMUNITY): Payer: Self-pay | Admitting: Physical Therapy

## 2022-09-29 DIAGNOSIS — M6281 Muscle weakness (generalized): Secondary | ICD-10-CM | POA: Diagnosis not present

## 2022-09-29 DIAGNOSIS — R262 Difficulty in walking, not elsewhere classified: Secondary | ICD-10-CM | POA: Diagnosis not present

## 2022-09-29 NOTE — Therapy (Addendum)
OUTPATIENT PHYSICAL THERAPY LOWER EXTREMITY TREATMENT   Patient Name: [name] MRN: [id number] DOB:May 24, 1960, 62 y.o., male Today's Date: 09/29/2022  END OF SESSION:  PT End of Session - 09/29/22 1119     Visit Number 2    Number of Visits 8    Date for PT Re-Evaluation 10/18/22    Authorization Type [facility] medicare auth put in    Progress Note Due on Visit 8    PT Start Time 1119    PT Stop Time 1200    PT Time Calculation (min) 41 min    Activity Tolerance Patient tolerated treatment well    Behavior During Therapy [facility] for tasks assessed/performed              Past Medical History:  Diagnosis Date   Acute biliary pancreatitis 10/23/2013   Acute osteomyelitis of right foot (HCC) 05/22/2021   AKI (acute kidney injury) (HCC) 05/22/2021   Arthritis    Biliary colic 10/23/2013   Chronic osteomyelitis of toe of right foot (HCC)    Diabetes mellitus    GERD (gastroesophageal reflux disease)    Headache(784.0)    History of kidney stones    Hypertension    Kidney stones    Neuropathy    Osteomyelitis of fourth toe of right foot (HCC) 06/16/2019   Past Surgical History:  Procedure Laterality Date   ABDOMINAL AORTOGRAM W/LOWER EXTREMITY N/A 02/10/2022   Procedure: ABDOMINAL AORTOGRAM W/LOWER EXTREMITY;  Surgeon: Victorino Sparrow, MD;  Location: [facility] INVASIVE CV LAB;  Service: Cardiovascular;  Laterality: N/A;   ACHILLES TENDON SURGERY Right 05/26/2021   Procedure: ACHILLES LENGTHENING/KIDNER;  Surgeon: Edwin Cap, DPM;  Location: [facility] OR;  Service: Podiatry;  Laterality: Right;   ACHILLES TENDON SURGERY Left 02/11/2022   Procedure: ACHILLES LENGTHENING/KIDNER;  Surgeon: Edwin Cap, DPM;  Location: [facility] OR;  Service: Podiatry;  Laterality: Left;   AMPUTATION Left 02/11/2022   Procedure: LEFT TRANSMETATARSAL AMPUTATION;  Surgeon: Edwin Cap, DPM;  Location: [facility] OR;  Service: Podiatry;  Laterality: Left;   AMPUTATION TOE Right 06/21/2019   Procedure:  AMPUTATION TOE;  Surgeon: Franky Macho, MD;  Location: [facility];  Service: General;  Laterality: Right;   AMPUTATION TOE Right 08/29/2020   Procedure: RIGHT THIRD AMPUTATION TOE;  Surgeon: Franky Macho, MD;  Location: [facility];  Service: General;  Laterality: Right;  third toe   AMPUTATION TOE Right 02/20/2021   Procedure: AMPUTATION  TRANSMATARSAL  RIGHT; GREAT TOE;  Surgeon: Franky Macho, MD;  Location: [facility];  Service: General;  Laterality: Right;   AMPUTATION TOE Left 02/07/2022   Procedure: AMPUTATION TOE;  Surgeon: Edwin Cap, DPM;  Location: [facility];  Service: Podiatry;  Laterality: Left;  amputation of remainder of Left 4th toe   BACK SURGERY  2022   CARDIAC CATHETERIZATION  01/21/2007   CHOLECYSTECTOMY N/A 10/24/2013   Procedure: LAPAROSCOPIC CHOLECYSTECTOMY;  Surgeon: Dalia Heading, MD;  Location: [facility];  Service: General;  Laterality: N/A;   COLONOSCOPY N/A 03/31/2013   Procedure: COLONOSCOPY;  Surgeon: Malissa Hippo, MD;  Location: [facility];  Service: Endoscopy;  Laterality: N/A;  730   IRRIGATION AND DEBRIDEMENT FOOT Right 04/19/2021   Procedure: IRRIGATION AND DEBRIDEMENT RIGHT SECOND TOE;  Surgeon: Franky Macho, MD;  Location: [facility];  Service: General;  Laterality: Right;   SECONDARY CLOSURE OF WOUND Right 04/19/2021   Procedure: IRRIGATION AND DEBRIDEMENT SECONDARY CLOSURE OF RIGHT GREAT TOE WOUND;  Surgeon: Franky Macho, MD;  Location: [facility];  Service: General;  Laterality: Right;   TRANSMETATARSAL AMPUTATION Right 05/24/2021   Procedure: TRANSMETATARSAL AMPUTATION;  Surgeon: Edwin Cap, DPM;  Location: [facility];  Service: Podiatry;  Laterality: Right;   TRANSMETATARSAL AMPUTATION Right 05/26/2021   Procedure: TRANSMETATARSAL AMPUTATION Revision;  Surgeon: Edwin Cap, DPM;  Location: [facility] OR;  Service: Podiatry;  Laterality: Right;   Patient Active Problem List   Diagnosis Date Noted   Medication monitoring encounter 02/28/2022   Equinus contracture of left  ankle 02/12/2022   Microcytic anemia 02/06/2022   Hypokalemia 02/06/2022   Abnormal surgical wound    Deformity of metatarsal bone of right foot    Open wound of right foot    Anxiety 05/22/2021   Diabetic foot infection (HCC) 08/27/2020   Spondylolisthesis of lumbar region 10/12/2019   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) 06/16/2019   Spinal stenosis of lumbar region 03/10/2018   Esophageal ulcer without bleeding 08/26/2010   HTN (hypertension) 08/26/2010   Controlled IDDM-2 with hyperglycemia 08/25/2010   Chronic pain 08/25/2010    PCP: Elfredia Nevins  REFERRING PROVIDER: Edwin Cap, DPM  REFERRING DIAG:  R26.9 (ICD-10-CM) - Gait disturbance  [phone] (ICD-10-CM) - Status post transmetatarsal amputation of foot, right (HCC)  E45.409 (ICD-10-CM) - Status post transmetatarsal amputation of foot, left (HCC)    THERAPY DIAG:  Muscle weakness (generalized)  Difficulty in walking, not elsewhere classified  Rationale for Evaluation and Treatment: Rehabilitation  ONSET DATE: January 2024    SUBJECTIVE STATEMENT: Pt states there's a little pain in his back and going down to his foot. Primarily L side. No falls. Has continued to use walking stick.   PERTINENT HISTORY: B transmetatarsal amputation, 2 back surgeries.  PAIN:  Are you having pain? Yes: NPRS scale: 5/10 Pain location: both feet  Pain description: pins and needles Aggravating factors: no known reason  Relieving factors: medication that he rubs on his feet  Back pain is a 7/10 but pt is not here for this   PRECAUTIONS: None  WEIGHT BEARING RESTRICTIONS: No  FALLS:  Has patient fallen in last 6 months? No  LIVING ENVIRONMENT: Lives with: lives alone Lives in: House/apartment Stairs: No Has following equipment at home:  walking stick   OCCUPATION: disability  PLOF: Independent with basic ADLs and Independent with household mobility with device  PATIENT GOALS: To be able to take  care chickens   NEXT MD VISIT: unknown  OBJECTIVE:   DIAGNOSTIC FINDINGS: none   COGNITION: Overall cognitive status: Within functional limits for tasks assessed     POSTURE: No Significant postural limitations   LOWER EXTREMITY ROM: WFL    LOWER EXTREMITY MMT:  MMT Right eval Left eval  Hip flexion 5 5  Hip extension 4 4  Hip abduction 5 5  Hip adduction    Hip internal rotation    Hip external rotation    Knee flexion 5 5  Knee extension 5 5  Ankle dorsiflexion 4+ 1  Ankle plantarflexion 2 2  Ankle inversion    Ankle eversion     (Blank rows = not tested)    FUNCTIONAL TESTS:  30 second sit to stand:  unable without UE assist 8; 9 is poor for age and sex  2 minute walk test: 246 ft with walking stick  Single leg stance:  0 rt ; 0 left     TODAY'S TREATMENT:  DATE:  09/29/22: Nustep L1 x 5 min LEs only Manual therapy  Grade II to III talocrural PAs for DF  Grade II to III talocrural distraction Supine  Gastroc stretch with strap 2x30 sec  Ankle PF/DF 2x10  Heel slide x10 Seated Ankle DF 2x10 Ankle Inv red TB 2x10 Ankle ev red TB 2x10 Ankle PF red TB 2x10 Standing  Feet together EO 2x30"  Feet together EO head turns x10 (very unsteady)  Hip abduction/ext diagonals 2x10   09/18/22:  Evaluation Sitting ankle dorsiflexion/plantarflexion x 10    PATIENT EDUCATION:  Education details: HEP Person educated: Patient Education method: Programmer, multimedia, Verbal cues, and Handouts Education comprehension: returned demonstration  HOME EXERCISE PROGRAM: Access Code: [email or web address] URL: [email or web address] Date: 09/29/2022 Prepared by: Vernon Prey April Kirstie Peri  Exercises - Seated Toe Raise  - 5 x daily - 7 x weekly - 1 sets - 10 reps - 5" hold - Seated Heel Raise  - 5 x daily - 7 x weekly - 1 sets - 10 reps - 5" hold -  Long Sitting Calf Stretch with Strap  - 1 x daily - 7 x weekly - 2 sets - 30 sec hold - Seated Ankle Dorsiflexion Stretch  - 1 x daily - 7 x weekly - 1 sets - 10 reps - Seated Ankle Eversion with Resistance  - 1 x daily - 7 x weekly - 2 sets - 10 reps - Seated Figure 4 Ankle Inversion with Resistance  - 1 x daily - 7 x weekly - 2 sets - 10 reps - Seated Ankle Plantarflexion with Resistance  - 1 x daily - 7 x weekly - 2 sets - 10 reps - 3 sec hold - Romberg Stance  - 1 x daily - 7 x weekly - 2 sets - 30 sec hold - Standing Hip Abduction with Counter Support  - 1 x daily - 7 x weekly - 2 sets - 10 reps  ASSESSMENT:  CLINICAL IMPRESSION: Pt is very hypomobile with L ankle DF. Provided joint mobilization/manual work to try and obtain greater range. Stretches added to HEP to try and address accordingly at home. Worked on providing more strengthening and balance exercises for foot/ankle and LE stability. Pt highly challenged with feet together balance and unable to maintain with head turns.   OBJECTIVE IMPAIRMENTS: Abnormal gait, decreased activity tolerance, decreased balance, difficulty walking, decreased strength, and pain.   ACTIVITY LIMITATIONS: carrying, lifting, standing, squatting, stairs, and locomotion level  PARTICIPATION LIMITATIONS: meal prep, cleaning, shopping, community activity, and yard work  [facility] POTENTIAL: Good  CLINICAL DECISION MAKING: Stable/uncomplicated  EVALUATION COMPLEXITY: Moderate   GOALS: Goals reviewed with patient? Yes  SHORT TERM GOALS: Target date: 10/02/22 Pt to be I in HEP in order to improve his mobility Baseline: Goal status: INITIAL  2.  PT to increase his strength to be able to come sit to stand without UE assist  Baseline:  Goal status: INITIAL  3.  PT to be able to single leg stance on each foot x 10 seconds to reduce risk of fall  Baseline:  Goal status: INITIAL  4.  Pt to have seen orthotist Baseline:  Goal status: INITIAL    LONG  TERM GOALS: Target date: 10/16/22  Pt to be I in HEP in order to improve his gait to be able to walk 300 ft in 2 minutes  Baseline:  Goal status: INITIAL  2.   PT to increase his strength to be able to come sit to  stand without UE assist 12 x in 30 seconds  Baseline:  Goal status: INITIAL  3.  PT to be able to single leg stance on each foot x 15 seconds to reduce risk of fall  Baseline:  Goal status: INITIAL  4.  Pt to have acquired prothesis for feet Baseline:  Goal status: INITIAL    PLAN:  PT FREQUENCY: 2x/week  PT DURATION: 4 weeks  PLANNED INTERVENTIONS: Therapeutic exercises, Therapeutic activity, Neuromuscular re-education, Balance training, Gait training, Patient/Family education, and Self Care  PLAN FOR NEXT SESSION: begin balance activity as well as strengthening    [place] April Ma L Omni Dunsworth, PT 09/29/2022, 11:20 AM

## 2022-10-01 ENCOUNTER — Encounter (HOSPITAL_COMMUNITY): Payer: Self-pay

## 2022-10-01 ENCOUNTER — Encounter (HOSPITAL_COMMUNITY): Payer: 59 | Admitting: Physical Therapy

## 2022-10-06 ENCOUNTER — Telehealth (HOSPITAL_COMMUNITY): Payer: Self-pay | Admitting: Physical Therapy

## 2022-10-06 ENCOUNTER — Encounter (HOSPITAL_COMMUNITY): Payer: 59 | Admitting: Physical Therapy

## 2022-10-06 NOTE — Telephone Encounter (Signed)
Called pt in regards to No-Show #1. Went to [employer] and left message. Reminded pt of his next appointment and to call for any reschedules or cancel.   Brock Mokry [name], PT

## 2022-10-06 NOTE — Therapy (Deleted)
OUTPATIENT PHYSICAL THERAPY LOWER EXTREMITY TREATMENT   Patient Name: [name] MRN: [id number] DOB:January 23, 1960, 62 y.o., male Today's Date: 10/06/2022  END OF SESSION:     Past Medical History:  Diagnosis Date   Acute biliary pancreatitis 10/23/2013   Acute osteomyelitis of right foot (HCC) 05/22/2021   AKI (acute kidney injury) (HCC) 05/22/2021   Arthritis    Biliary colic 10/23/2013   Chronic osteomyelitis of toe of right foot (HCC)    Diabetes mellitus    GERD (gastroesophageal reflux disease)    Headache(784.0)    History of kidney stones    Hypertension    Kidney stones    Neuropathy    Osteomyelitis of fourth toe of right foot (HCC) 06/16/2019   Past Surgical History:  Procedure Laterality Date   ABDOMINAL AORTOGRAM W/LOWER EXTREMITY N/A 02/10/2022   Procedure: ABDOMINAL AORTOGRAM W/LOWER EXTREMITY;  Surgeon: Victorino Sparrow, MD;  Location: [facility] INVASIVE CV LAB;  Service: Cardiovascular;  Laterality: N/A;   ACHILLES TENDON SURGERY Right 05/26/2021   Procedure: ACHILLES LENGTHENING/KIDNER;  Surgeon: Edwin Cap, DPM;  Location: [facility] OR;  Service: Podiatry;  Laterality: Right;   ACHILLES TENDON SURGERY Left 02/11/2022   Procedure: ACHILLES LENGTHENING/KIDNER;  Surgeon: Edwin Cap, DPM;  Location: [facility] OR;  Service: Podiatry;  Laterality: Left;   AMPUTATION Left 02/11/2022   Procedure: LEFT TRANSMETATARSAL AMPUTATION;  Surgeon: Edwin Cap, DPM;  Location: [facility] OR;  Service: Podiatry;  Laterality: Left;   AMPUTATION TOE Right 06/21/2019   Procedure: AMPUTATION TOE;  Surgeon: Franky Macho, MD;  Location: [facility];  Service: General;  Laterality: Right;   AMPUTATION TOE Right 08/29/2020   Procedure: RIGHT THIRD AMPUTATION TOE;  Surgeon: Franky Macho, MD;  Location: [facility];  Service: General;  Laterality: Right;  third toe   AMPUTATION TOE Right 02/20/2021   Procedure: AMPUTATION  TRANSMATARSAL  RIGHT; GREAT TOE;  Surgeon: Franky Macho, MD;  Location: [facility];   Service: General;  Laterality: Right;   AMPUTATION TOE Left 02/07/2022   Procedure: AMPUTATION TOE;  Surgeon: Edwin Cap, DPM;  Location: [facility];  Service: Podiatry;  Laterality: Left;  amputation of remainder of Left 4th toe   BACK SURGERY  2022   CARDIAC CATHETERIZATION  01/21/2007   CHOLECYSTECTOMY N/A 10/24/2013   Procedure: LAPAROSCOPIC CHOLECYSTECTOMY;  Surgeon: Dalia Heading, MD;  Location: [facility];  Service: General;  Laterality: N/A;   COLONOSCOPY N/A 03/31/2013   Procedure: COLONOSCOPY;  Surgeon: Malissa Hippo, MD;  [facility]: [facility];  Service: Endoscopy;  Laterality: N/A;  730   IRRIGATION AND DEBRIDEMENT FOOT Right 04/19/2021   Procedure: IRRIGATION AND DEBRIDEMENT RIGHT SECOND TOE;  Surgeon: Franky Macho, MD;  Location: [facility];  Service: General;  Laterality: Right;   SECONDARY CLOSURE OF WOUND Right 04/19/2021   Procedure: IRRIGATION AND DEBRIDEMENT SECONDARY CLOSURE OF RIGHT GREAT TOE WOUND;  Surgeon: Franky Macho, MD;  Location: [facility];  Service: General;  Laterality: Right;   TRANSMETATARSAL AMPUTATION Right 05/24/2021   Procedure: TRANSMETATARSAL AMPUTATION;  Surgeon: Edwin Cap, DPM;  Location: [facility];  Service: Podiatry;  Laterality: Right;   TRANSMETATARSAL AMPUTATION Right 05/26/2021   Procedure: TRANSMETATARSAL AMPUTATION Revision;  Surgeon: Edwin Cap, DPM;  Location: [facility] OR;  Service: Podiatry;  Laterality: Right;   Patient Active Problem List   Diagnosis Date Noted   Medication monitoring encounter 02/28/2022   Equinus contracture of left ankle 02/12/2022   Microcytic anemia 02/06/2022   Hypokalemia 02/06/2022  Abnormal surgical wound    Deformity of metatarsal bone of right foot    Open wound of right foot    Anxiety 05/22/2021   Diabetic foot infection (HCC) 08/27/2020   Spondylolisthesis of lumbar region 10/12/2019   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) 06/16/2019   Spinal stenosis of lumbar region 03/10/2018   Esophageal  ulcer without bleeding 08/26/2010   HTN (hypertension) 08/26/2010   Controlled IDDM-2 with hyperglycemia 08/25/2010   Chronic pain 08/25/2010    PCP: Elfredia Nevins  REFERRING PROVIDER: Edwin Cap, DPM  REFERRING DIAG:  R26.9 (ICD-10-CM) - Gait disturbance  [phone] (ICD-10-CM) - Status post transmetatarsal amputation of foot, right (HCC)  U72.536 (ICD-10-CM) - Status post transmetatarsal amputation of foot, left (HCC)    THERAPY DIAG:  No diagnosis found.  Rationale for Evaluation and Treatment: Rehabilitation  ONSET DATE: January 2024    SUBJECTIVE STATEMENT: *** Pt states there's a little pain in his back and going down to his foot. Primarily L side. No falls. Has continued to use walking stick.   PERTINENT HISTORY: B transmetatarsal amputation, 2 back surgeries.  PAIN:  Are you having pain? Yes: NPRS scale: 5/10 Pain location: both feet  Pain description: pins and needles Aggravating factors: no known reason  Relieving factors: medication that he rubs on his feet  Back pain is a 7/10 but pt is not here for this   PRECAUTIONS: None  WEIGHT BEARING RESTRICTIONS: No  FALLS:  Has patient fallen in last 6 months? No  LIVING ENVIRONMENT: Lives with: lives alone Lives in: House/apartment Stairs: No Has following equipment at home:  walking stick   OCCUPATION: disability  PLOF: Independent with basic ADLs and Independent with household mobility with device  PATIENT GOALS: To be able to take care chickens   NEXT MD VISIT: unknown  OBJECTIVE:   DIAGNOSTIC FINDINGS: none   COGNITION: Overall cognitive status: Within functional limits for tasks assessed     POSTURE: No Significant postural limitations   LOWER EXTREMITY ROM: WFL    LOWER EXTREMITY MMT:  MMT Right eval Left eval  Hip flexion 5 5  Hip extension 4 4  Hip abduction 5 5  Hip adduction    Hip internal rotation    Hip external rotation    Knee flexion 5 5  Knee extension 5 5   Ankle dorsiflexion 4+ 1  Ankle plantarflexion 2 2  Ankle inversion    Ankle eversion     (Blank rows = not tested)    FUNCTIONAL TESTS:  30 second sit to stand:  unable without UE assist 8; 9 is poor for age and sex  2 minute walk test: 246 ft with walking stick  Single leg stance:  0 rt ; 0 left     TODAY'S TREATMENT:  DATE:  10/06/22 ***  09/29/22: Nustep L1 x 5 min LEs only Manual therapy  Grade II to III talocrural PAs for DF  Grade II to III talocrural distraction Supine  Gastroc stretch with strap 2x30 sec  Ankle PF/DF 2x10  Heel slide x10 Seated Ankle DF 2x10 Ankle Inv red TB 2x10 Ankle ev red TB 2x10 Ankle PF red TB 2x10 Standing  Feet together EO 2x30"  Feet together EO head turns x10 (very unsteady)  Hip abduction/ext diagonals 2x10   09/18/22:  Evaluation Sitting ankle dorsiflexion/plantarflexion x 10    PATIENT EDUCATION:  Education details: HEP Person educated: Patient Education method: Programmer, multimedia, Verbal cues, and Handouts Education comprehension: returned demonstration  HOME EXERCISE PROGRAM: Access Code: [email or web address] URL: [email or web address] Date: 09/29/2022 Prepared by: Vernon Prey April Kirstie Peri  Exercises - Seated Toe Raise  - 5 x daily - 7 x weekly - 1 sets - 10 reps - 5" hold - Seated Heel Raise  - 5 x daily - 7 x weekly - 1 sets - 10 reps - 5" hold - Long Sitting Calf Stretch with Strap  - 1 x daily - 7 x weekly - 2 sets - 30 sec hold - Seated Ankle Dorsiflexion Stretch  - 1 x daily - 7 x weekly - 1 sets - 10 reps - Seated Ankle Eversion with Resistance  - 1 x daily - 7 x weekly - 2 sets - 10 reps - Seated Figure 4 Ankle Inversion with Resistance  - 1 x daily - 7 x weekly - 2 sets - 10 reps - Seated Ankle Plantarflexion with Resistance  - 1 x daily - 7 x weekly - 2 sets - 10 reps - 3 sec hold -  Romberg Stance  - 1 x daily - 7 x weekly - 2 sets - 30 sec hold - Standing Hip Abduction with Counter Support  - 1 x daily - 7 x weekly - 2 sets - 10 reps  ASSESSMENT:  CLINICAL IMPRESSION: *** Pt is very hypomobile with L ankle DF. Provided joint mobilization/manual work to try and obtain greater range. Stretches added to HEP to try and address accordingly at home. Worked on providing more strengthening and balance exercises for foot/ankle and LE stability. Pt highly challenged with feet together balance and unable to maintain with head turns.   OBJECTIVE IMPAIRMENTS: Abnormal gait, decreased activity tolerance, decreased balance, difficulty walking, decreased strength, and pain.     GOALS: Goals reviewed with patient? Yes  SHORT TERM GOALS: Target date: 10/02/22 Pt to be I in HEP in order to improve his mobility Baseline: Goal status: INITIAL  2.  PT to increase his strength to be able to come sit to stand without UE assist  Baseline:  Goal status: INITIAL  3.  PT to be able to single leg stance on each foot x 10 seconds to reduce risk of fall  Baseline:  Goal status: INITIAL  4.  Pt to have seen orthotist Baseline:  Goal status: INITIAL    LONG TERM GOALS: Target date: 10/16/22  Pt to be I in HEP in order to improve his gait to be able to walk 300 ft in 2 minutes  Baseline:  Goal status: INITIAL  2.   PT to increase his strength to be able to come sit to stand without UE assist 12 x in 30 seconds  Baseline:  Goal status: INITIAL  3.  PT to be able to single leg stance on each foot x 15 seconds  to reduce risk of fall  Baseline:  Goal status: INITIAL  4.  Pt to have acquired prothesis for feet Baseline:  Goal status: INITIAL    PLAN:  PT FREQUENCY: 2x/week  PT DURATION: 4 weeks  PLANNED INTERVENTIONS: Therapeutic exercises, Therapeutic activity, Neuromuscular re-education, Balance training, Gait training, Patient/Family education, and Self Care  PLAN FOR  NEXT SESSION: begin balance activity as well as strengthening    Maven Varelas April Ma L Anayelli Lai, PT 10/06/2022, 7:55 AM

## 2022-10-08 ENCOUNTER — Encounter (HOSPITAL_COMMUNITY): Payer: 59 | Admitting: Physical Therapy

## 2022-10-08 ENCOUNTER — Telehealth (HOSPITAL_COMMUNITY): Payer: Self-pay | Admitting: Physical Therapy

## 2022-10-08 NOTE — Therapy (Deleted)
OUTPATIENT PHYSICAL THERAPY LOWER EXTREMITY TREATMENT   Patient Name: [name] MRN: [id number] DOB:12/15/1960, 62 y.o., male Today's Date: 10/08/2022  END OF SESSION:     Past Medical History:  Diagnosis Date   Acute biliary pancreatitis 10/23/2013   Acute osteomyelitis of right foot (HCC) 05/22/2021   AKI (acute kidney injury) (HCC) 05/22/2021   Arthritis    Biliary colic 10/23/2013   Chronic osteomyelitis of toe of right foot (HCC)    Diabetes mellitus    GERD (gastroesophageal reflux disease)    Headache(784.0)    History of kidney stones    Hypertension    Kidney stones    Neuropathy    Osteomyelitis of fourth toe of right foot (HCC) 06/16/2019   Past Surgical History:  Procedure Laterality Date   ABDOMINAL AORTOGRAM W/LOWER EXTREMITY N/A 02/10/2022   Procedure: ABDOMINAL AORTOGRAM W/LOWER EXTREMITY;  Surgeon: Victorino Sparrow, MD;  Location: [facility] INVASIVE CV LAB;  Service: Cardiovascular;  Laterality: N/A;   ACHILLES TENDON SURGERY Right 05/26/2021   Procedure: ACHILLES LENGTHENING/KIDNER;  Surgeon: Edwin Cap, DPM;  Location: [facility] OR;  Service: Podiatry;  Laterality: Right;   ACHILLES TENDON SURGERY Left 02/11/2022   Procedure: ACHILLES LENGTHENING/KIDNER;  Surgeon: Edwin Cap, DPM;  Location: [facility] OR;  Service: Podiatry;  Laterality: Left;   AMPUTATION Left 02/11/2022   Procedure: LEFT TRANSMETATARSAL AMPUTATION;  Surgeon: Edwin Cap, DPM;  Location: [facility] OR;  Service: Podiatry;  Laterality: Left;   AMPUTATION TOE Right 06/21/2019   Procedure: AMPUTATION TOE;  Surgeon: Franky Macho, MD;  Location: [facility] ORS;  Service: General;  Laterality: Right;   AMPUTATION TOE Right 08/29/2020   Procedure: RIGHT THIRD AMPUTATION TOE;  Surgeon: Franky Macho, MD;  Location: [facility] ORS;  Service: General;  Laterality: Right;  third toe   AMPUTATION TOE Right 02/20/2021   Procedure: AMPUTATION  TRANSMATARSAL  RIGHT; GREAT TOE;  Surgeon: Franky Macho, MD;  Location: [facility];   Service: General;  Laterality: Right;   AMPUTATION TOE Left 02/07/2022   Procedure: AMPUTATION TOE;  Surgeon: Edwin Cap, DPM;  Location: [facility] OR;  Service: Podiatry;  Laterality: Left;  amputation of remainder of Left 4th toe   BACK SURGERY  2022   CARDIAC CATHETERIZATION  01/21/2007   CHOLECYSTECTOMY N/A 10/24/2013   Procedure: LAPAROSCOPIC CHOLECYSTECTOMY;  Surgeon: Dalia Heading, MD;  Location: [facility];  Service: General;  Laterality: N/A;   COLONOSCOPY N/A 03/31/2013   Procedure: COLONOSCOPY;  Surgeon: Malissa Hippo, MD;  Location: [facility];  Service: Endoscopy;  Laterality: N/A;  730   IRRIGATION AND DEBRIDEMENT FOOT Right 04/19/2021   Procedure: IRRIGATION AND DEBRIDEMENT RIGHT SECOND TOE;  Surgeon: Franky Macho, MD;  Location: [facility];  Service: General;  Laterality: Right;   SECONDARY CLOSURE OF WOUND Right 04/19/2021   Procedure: IRRIGATION AND DEBRIDEMENT SECONDARY CLOSURE OF RIGHT GREAT TOE WOUND;  Surgeon: Franky Macho, MD;  Location: [facility];  Service: General;  Laterality: Right;   TRANSMETATARSAL AMPUTATION Right 05/24/2021   Procedure: TRANSMETATARSAL AMPUTATION;  Surgeon: Edwin Cap, DPM;  Location: [facility] OR;  Service: Podiatry;  Laterality: Right;   TRANSMETATARSAL AMPUTATION Right 05/26/2021   Procedure: TRANSMETATARSAL AMPUTATION Revision;  Surgeon: Edwin Cap, DPM;  Location: [facility] OR;  Service: Podiatry;  Laterality: Right;   Patient Active Problem List   Diagnosis Date Noted   Medication monitoring encounter 02/28/2022   Equinus contracture of left ankle 02/12/2022   Microcytic anemia 02/06/2022   Hypokalemia 02/06/2022  Abnormal surgical wound    Deformity of metatarsal bone of right foot    Open wound of right foot    Anxiety 05/22/2021   Diabetic foot infection (HCC) 08/27/2020   Spondylolisthesis of lumbar region 10/12/2019   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) 06/16/2019   Spinal stenosis of lumbar region 03/10/2018   Esophageal  ulcer without bleeding 08/26/2010   HTN (hypertension) 08/26/2010   Controlled IDDM-2 with hyperglycemia 08/25/2010   Chronic pain 08/25/2010    PCP: Elfredia Nevins  REFERRING PROVIDER: Edwin Cap, DPM  REFERRING DIAG:  R26.9 (ICD-10-CM) - Gait disturbance  [phone] (ICD-10-CM) - Status post transmetatarsal amputation of foot, right (HCC)  Z30.865 (ICD-10-CM) - Status post transmetatarsal amputation of foot, left (HCC)    THERAPY DIAG:  No diagnosis found.  Rationale for Evaluation and Treatment: Rehabilitation  ONSET DATE: January 2024    SUBJECTIVE STATEMENT: *** Pt states there's a little pain in his back and going down to his foot. Primarily L side. No falls. Has continued to use walking stick.   PERTINENT HISTORY: B transmetatarsal amputation, 2 back surgeries.  PAIN:  Are you having pain? Yes: NPRS scale: 5/10 Pain location: both feet  Pain description: pins and needles Aggravating factors: no known reason  Relieving factors: medication that he rubs on his feet  Back pain is a 7/10 but pt is not here for this   PRECAUTIONS: None  WEIGHT BEARING RESTRICTIONS: No  FALLS:  Has patient fallen in last 6 months? No  LIVING ENVIRONMENT: Lives with: lives alone Lives in: House/apartment Stairs: No Has following equipment at home:  walking stick   OCCUPATION: disability  PLOF: Independent with basic ADLs and Independent with household mobility with device  PATIENT GOALS: To be able to take care chickens   NEXT MD VISIT: unknown  OBJECTIVE:   DIAGNOSTIC FINDINGS: none   COGNITION: Overall cognitive status: Within functional limits for tasks assessed     POSTURE: No Significant postural limitations   LOWER EXTREMITY ROM: WFL    LOWER EXTREMITY MMT:  MMT Right eval Left eval  Hip flexion 5 5  Hip extension 4 4  Hip abduction 5 5  Hip adduction    Hip internal rotation    Hip external rotation    Knee flexion 5 5  Knee extension 5 5   Ankle dorsiflexion 4+ 1  Ankle plantarflexion 2 2  Ankle inversion    Ankle eversion     (Blank rows = not tested)    FUNCTIONAL TESTS:  30 second sit to stand:  unable without UE assist 8; 9 is poor for age and sex  2 minute walk test: 246 ft with walking stick  Single leg stance:  0 rt ; 0 left     TODAY'S TREATMENT:  DATE:  10/06/22 ***  09/29/22: Nustep L1 x 5 min LEs only Manual therapy  Grade II to III talocrural PAs for DF  Grade II to III talocrural distraction Supine  Gastroc stretch with strap 2x30 sec  Ankle PF/DF 2x10  Heel slide x10 Seated Ankle DF 2x10 Ankle Inv red TB 2x10 Ankle ev red TB 2x10 Ankle PF red TB 2x10 Standing  Feet together EO 2x30"  Feet together EO head turns x10 (very unsteady)  Hip abduction/ext diagonals 2x10   09/18/22:  Evaluation Sitting ankle dorsiflexion/plantarflexion x 10    PATIENT EDUCATION:  Education details: HEP Person educated: Patient Education method: Programmer, multimedia, Verbal cues, and Handouts Education comprehension: returned demonstration  HOME EXERCISE PROGRAM: Access Code: [email or web address] URL: [email or web address] Date: 09/29/2022 Prepared by: Vernon Prey April Kirstie Peri  Exercises - Seated Toe Raise  - 5 x daily - 7 x weekly - 1 sets - 10 reps - 5" hold - Seated Heel Raise  - 5 x daily - 7 x weekly - 1 sets - 10 reps - 5" hold - Long Sitting Calf Stretch with Strap  - 1 x daily - 7 x weekly - 2 sets - 30 sec hold - Seated Ankle Dorsiflexion Stretch  - 1 x daily - 7 x weekly - 1 sets - 10 reps - Seated Ankle Eversion with Resistance  - 1 x daily - 7 x weekly - 2 sets - 10 reps - Seated Figure 4 Ankle Inversion with Resistance  - 1 x daily - 7 x weekly - 2 sets - 10 reps - Seated Ankle Plantarflexion with Resistance  - 1 x daily - 7 x weekly - 2 sets - 10 reps - 3 sec hold -  Romberg Stance  - 1 x daily - 7 x weekly - 2 sets - 30 sec hold - Standing Hip Abduction with Counter Support  - 1 x daily - 7 x weekly - 2 sets - 10 reps  ASSESSMENT:  CLINICAL IMPRESSION: *** Pt is very hypomobile with L ankle DF. Provided joint mobilization/manual work to try and obtain greater range. Stretches added to HEP to try and address accordingly at home. Worked on providing more strengthening and balance exercises for foot/ankle and LE stability. Pt highly challenged with feet together balance and unable to maintain with head turns.   OBJECTIVE IMPAIRMENTS: Abnormal gait, decreased activity tolerance, decreased balance, difficulty walking, decreased strength, and pain.     GOALS: Goals reviewed with patient? Yes  SHORT TERM GOALS: Target date: 10/02/22 Pt to be I in HEP in order to improve his mobility Baseline: Goal status: INITIAL  2.  PT to increase his strength to be able to come sit to stand without UE assist  Baseline:  Goal status: INITIAL  3.  PT to be able to single leg stance on each foot x 10 seconds to reduce risk of fall  Baseline:  Goal status: INITIAL  4.  Pt to have seen orthotist Baseline:  Goal status: INITIAL    LONG TERM GOALS: Target date: 10/16/22  Pt to be I in HEP in order to improve his gait to be able to walk 300 ft in 2 minutes  Baseline:  Goal status: INITIAL  2.   PT to increase his strength to be able to come sit to stand without UE assist 12 x in 30 seconds  Baseline:  Goal status: INITIAL  3.  PT to be able to single leg stance on each foot x 15 seconds  to reduce risk of fall  Baseline:  Goal status: INITIAL  4.  Pt to have acquired prothesis for feet Baseline:  Goal status: INITIAL    PLAN:  PT FREQUENCY: 2x/week  PT DURATION: 4 weeks  PLANNED INTERVENTIONS: Therapeutic exercises, Therapeutic activity, Neuromuscular re-education, Balance training, Gait training, Patient/Family education, and Self Care  PLAN FOR  NEXT SESSION: begin balance activity as well as strengthening    Wilhemenia Camba April Ma L Chaselyn Nanney, PT 10/08/2022, 8:40 AM

## 2022-10-08 NOTE — Telephone Encounter (Signed)
Called pt in regards to no-show #2. Went to [employer]. PT left message stating that pt has no other scheduled appointments at this time. Told pt he can call to schedule more visits if he wants and that we have his prosthetics referral if he wants to stop by and get that to take to the prosthetist's office. Will d/c if no call back within the next few weeks.   [name] [name], PT

## 2022-10-24 DIAGNOSIS — E1129 Type 2 diabetes mellitus with other diabetic kidney complication: Secondary | ICD-10-CM | POA: Diagnosis not present

## 2022-10-24 DIAGNOSIS — E1140 Type 2 diabetes mellitus with diabetic neuropathy, unspecified: Secondary | ICD-10-CM | POA: Diagnosis not present

## 2022-10-24 DIAGNOSIS — G894 Chronic pain syndrome: Secondary | ICD-10-CM | POA: Diagnosis not present

## 2022-10-28 DIAGNOSIS — T819XXA Unspecified complication of procedure, initial encounter: Secondary | ICD-10-CM | POA: Diagnosis not present

## 2022-10-28 DIAGNOSIS — S91301A Unspecified open wound, right foot, initial encounter: Secondary | ICD-10-CM | POA: Diagnosis not present

## 2022-10-28 DIAGNOSIS — M21961 Unspecified acquired deformity of right lower leg: Secondary | ICD-10-CM | POA: Diagnosis not present

## 2022-11-18 DIAGNOSIS — E1129 Type 2 diabetes mellitus with other diabetic kidney complication: Secondary | ICD-10-CM | POA: Diagnosis not present

## 2022-11-18 DIAGNOSIS — M4306 Spondylolysis, lumbar region: Secondary | ICD-10-CM | POA: Diagnosis not present

## 2022-11-18 DIAGNOSIS — E1142 Type 2 diabetes mellitus with diabetic polyneuropathy: Secondary | ICD-10-CM | POA: Diagnosis not present

## 2022-11-18 DIAGNOSIS — I10 Essential (primary) hypertension: Secondary | ICD-10-CM | POA: Diagnosis not present

## 2022-11-18 DIAGNOSIS — E1140 Type 2 diabetes mellitus with diabetic neuropathy, unspecified: Secondary | ICD-10-CM | POA: Diagnosis not present

## 2022-11-18 DIAGNOSIS — G894 Chronic pain syndrome: Secondary | ICD-10-CM | POA: Diagnosis not present

## 2022-11-18 DIAGNOSIS — L03119 Cellulitis of unspecified part of limb: Secondary | ICD-10-CM | POA: Diagnosis not present

## 2022-11-26 ENCOUNTER — Ambulatory Visit: Payer: 59 | Admitting: Podiatry

## 2022-11-28 DIAGNOSIS — S91301A Unspecified open wound, right foot, initial encounter: Secondary | ICD-10-CM | POA: Diagnosis not present

## 2022-11-28 DIAGNOSIS — M21961 Unspecified acquired deformity of right lower leg: Secondary | ICD-10-CM | POA: Diagnosis not present

## 2022-11-28 DIAGNOSIS — T819XXA Unspecified complication of procedure, initial encounter: Secondary | ICD-10-CM | POA: Diagnosis not present

## 2022-12-01 DIAGNOSIS — Z89432 Acquired absence of left foot: Secondary | ICD-10-CM | POA: Diagnosis not present

## 2022-12-01 DIAGNOSIS — Z89431 Acquired absence of right foot: Secondary | ICD-10-CM | POA: Diagnosis not present

## 2022-12-03 DIAGNOSIS — M5416 Radiculopathy, lumbar region: Secondary | ICD-10-CM | POA: Diagnosis not present

## 2022-12-05 ENCOUNTER — Encounter: Payer: Self-pay | Admitting: Podiatry

## 2022-12-05 ENCOUNTER — Ambulatory Visit (INDEPENDENT_AMBULATORY_CARE_PROVIDER_SITE_OTHER): Payer: 59

## 2022-12-05 ENCOUNTER — Ambulatory Visit (INDEPENDENT_AMBULATORY_CARE_PROVIDER_SITE_OTHER): Payer: 59 | Admitting: Podiatry

## 2022-12-05 DIAGNOSIS — M778 Other enthesopathies, not elsewhere classified: Secondary | ICD-10-CM

## 2022-12-05 DIAGNOSIS — M779 Enthesopathy, unspecified: Secondary | ICD-10-CM | POA: Diagnosis not present

## 2022-12-05 DIAGNOSIS — E1142 Type 2 diabetes mellitus with diabetic polyneuropathy: Secondary | ICD-10-CM

## 2022-12-05 MED ORDER — PREGABALIN 50 MG PO CAPS: 50.0000 mg | ORAL_CAPSULE | Freq: Three times a day (TID) | ORAL | 4 refills | Status: DC

## 2022-12-08 DIAGNOSIS — M779 Enthesopathy, unspecified: Secondary | ICD-10-CM | POA: Diagnosis not present

## 2022-12-08 MED ORDER — TRIAMCINOLONE ACETONIDE 10 MG/ML IJ SUSP
10.0000 mg | Freq: Once | INTRAMUSCULAR | Status: AC
  Administered 2022-12-08: 10 mg via INTRA_ARTICULAR

## 2022-12-08 NOTE — Progress Notes (Signed)
Subjective:   Patient ID: [name], male   DOB: 62 y.o.   MRN: [id number]   HPI Patient presents with pain on top of his feet of both feet stating he did well for a good period of time started to get sore   ROS      Objective:  Physical Exam  Neurovascular status intact with tendinitis bilateral with inflammation noted within the extensor complex bilateral     Assessment:  Extensor tendinitis bilateral did well for a good period of time     Plan:  Sterile prep went ahead and injected the extensor complex 3 mg Kenalog 5 mg Xylocaine bilateral after explaining risk advised on ice therapy reappoint as symptoms indicate  X-rays dated today do indicate spurring of the dorsal feet with moderate arthritis midtarsal joint no signs of fracture or acute injury

## 2022-12-25 DIAGNOSIS — E1140 Type 2 diabetes mellitus with diabetic neuropathy, unspecified: Secondary | ICD-10-CM | POA: Diagnosis not present

## 2022-12-25 DIAGNOSIS — J3089 Other allergic rhinitis: Secondary | ICD-10-CM | POA: Diagnosis not present

## 2022-12-25 DIAGNOSIS — E1129 Type 2 diabetes mellitus with other diabetic kidney complication: Secondary | ICD-10-CM | POA: Diagnosis not present

## 2023-01-01 ENCOUNTER — Emergency Department (HOSPITAL_COMMUNITY): Payer: 59

## 2023-01-01 ENCOUNTER — Encounter (HOSPITAL_COMMUNITY): Payer: Self-pay | Admitting: *Deleted

## 2023-01-01 ENCOUNTER — Other Ambulatory Visit: Payer: Self-pay

## 2023-01-01 ENCOUNTER — Emergency Department (HOSPITAL_COMMUNITY)
Admission: EM | Admit: 2023-01-01 | Discharge: 2023-01-01 | Disposition: A | Discharge status: Home / Self Care | Payer: 59 | Attending: Emergency Medicine | Admitting: Emergency Medicine

## 2023-01-01 DIAGNOSIS — Z91040 Latex allergy status: Secondary | ICD-10-CM | POA: Insufficient documentation

## 2023-01-01 DIAGNOSIS — E876 Hypokalemia: Secondary | ICD-10-CM | POA: Diagnosis not present

## 2023-01-01 DIAGNOSIS — D649 Anemia, unspecified: Secondary | ICD-10-CM | POA: Diagnosis not present

## 2023-01-01 DIAGNOSIS — K5730 Diverticulosis of large intestine without perforation or abscess without bleeding: Secondary | ICD-10-CM | POA: Diagnosis not present

## 2023-01-01 DIAGNOSIS — R1031 Right lower quadrant pain: Secondary | ICD-10-CM | POA: Diagnosis not present

## 2023-01-01 DIAGNOSIS — Z794 Long term (current) use of insulin: Secondary | ICD-10-CM | POA: Diagnosis not present

## 2023-01-01 DIAGNOSIS — Z7982 Long term (current) use of aspirin: Secondary | ICD-10-CM | POA: Insufficient documentation

## 2023-01-01 DIAGNOSIS — R109 Unspecified abdominal pain: Secondary | ICD-10-CM

## 2023-01-01 DIAGNOSIS — K838 Other specified diseases of biliary tract: Secondary | ICD-10-CM | POA: Diagnosis not present

## 2023-01-01 DIAGNOSIS — R000 Tachycardia, unspecified: Secondary | ICD-10-CM | POA: Diagnosis not present

## 2023-01-01 LAB — CBC WITH DIFFERENTIAL/PLATELET
Abs Immature Granulocytes: 0.07 10*3/uL (ref 0.00–0.07)
Basophils Absolute: 0 10*3/uL (ref 0.0–0.1)
Basophils Relative: 0 %
Eosinophils Absolute: 0.1 10*3/uL (ref 0.0–0.5)
Eosinophils Relative: 1 %
HCT: 37.7 % — ABNORMAL LOW (ref 39.0–52.0)
Hemoglobin: 11.7 g/dL — ABNORMAL LOW (ref 13.0–17.0)
Immature Granulocytes: 1 %
Lymphocytes Relative: 26 %
Lymphs Abs: 2.3 10*3/uL (ref 0.7–4.0)
MCH: 26.1 pg (ref 26.0–34.0)
MCHC: 31 g/dL (ref 30.0–36.0)
MCV: 84 fL (ref 80.0–100.0)
Monocytes Absolute: 0.6 10*3/uL (ref 0.1–1.0)
Monocytes Relative: 7 %
Neutro Abs: 5.7 10*3/uL (ref 1.7–7.7)
Neutrophils Relative %: 65 %
Platelets: 248 10*3/uL (ref 150–400)
RBC: 4.49 MIL/uL (ref 4.22–5.81)
RDW: 17 % — ABNORMAL HIGH (ref 11.5–15.5)
WBC: 8.8 10*3/uL (ref 4.0–10.5)
nRBC: 0 % (ref 0.0–0.2)

## 2023-01-01 LAB — BASIC METABOLIC PANEL
Anion gap: 14 (ref 5–15)
BUN: 21 mg/dL (ref 8–23)
CO2: 23 mmol/L (ref 22–32)
Calcium: 9 mg/dL (ref 8.9–10.3)
Chloride: 105 mmol/L (ref 98–111)
Creatinine, Ser: 1.2 mg/dL (ref 0.61–1.24)
GFR, Estimated: >60 mL/min (ref >60)
Glucose, Bld: 158 mg/dL — ABNORMAL HIGH (ref 70–99)
Potassium: 3.3 mmol/L — ABNORMAL LOW (ref 3.5–5.1)
Sodium: 142 mmol/L (ref 135–145)

## 2023-01-01 LAB — URINALYSIS, ROUTINE W REFLEX MICROSCOPIC
Bacteria, UA: NONE SEEN
Bilirubin Urine: NEGATIVE
Glucose, UA: NEGATIVE mg/dL
Hgb urine dipstick: NEGATIVE
Ketones, ur: NEGATIVE mg/dL
Leukocytes,Ua: NEGATIVE
Nitrite: NEGATIVE
Protein, ur: 30 mg/dL — AB
Specific Gravity, Urine: 1.023 (ref 1.005–1.030)
pH: 5 (ref 5.0–8.0)

## 2023-01-01 MED ORDER — ONDANSETRON HCL 4 MG/2ML IJ SOLN
4.0000 mg | Freq: Once | INTRAMUSCULAR | Status: AC
  Administered 2023-01-01: 4 mg via INTRAVENOUS
  Filled 2023-01-01: qty 2

## 2023-01-01 MED ORDER — MORPHINE SULFATE (PF) 4 MG/ML IV SOLN
4.0000 mg | Freq: Once | INTRAVENOUS | Status: AC
  Administered 2023-01-01: 4 mg via INTRAVENOUS
  Filled 2023-01-01: qty 1

## 2023-01-01 NOTE — ED Triage Notes (Signed)
Pt with elevated BP at home, pt with right flank pain since last week and seen PCP about it, was told he had a virus. Denies any blood in urine or burning with urination, pt with hx of kidney stones.

## 2023-01-01 NOTE — Discharge Instructions (Addendum)
Today you were seen for flank pain.  You may alternate taking Tylenol and Motrin as needed for pain.  Please do not take Motrin for greater than 5 days in a row as this may cause rebound headaches.  Thank you for letting us treat you today. After reviewing your labs and imaging, I feel you are safe to go home. Please follow up with your PCP in the next several days and provide them with your records from this visit. Return to the Emergency Room if pain becomes severe or symptoms worsen.

## 2023-01-01 NOTE — ED Notes (Signed)
Pt given a urinal.

## 2023-01-01 NOTE — ED Provider Notes (Signed)
[place] EMERGENCY DEPARTMENT AT [facility] Provider Note   CSN: [id number] Arrival date & time: 01/01/23  1225     History  Chief Complaint  Patient presents with   Hypertension    [name] is a 62 y.o. male medical history significant for kidney stones presents today for right flank pain x 1 week.  Patient is also had elevated blood pressure readings at home.  Patient saw PCP who said he had a virus.  Patient says the pain wraps around to the right lower quadrant and groin area.  Patient denies hematuria, dysuria, diarrhea, fevers, or chills.  Patient does endorse 1 episode of emesis and nausea.  Patient does report previous cholecystectomy   Hypertension       Home Medications Prior to Admission medications   Medication Sig Start Date End Date Taking? Authorizing Provider  acetaminophen (TYLENOL) 500 MG tablet Take 2 tablets (1,000 mg total) by mouth every 6 (six) hours as needed for up to 20 doses (pain). 02/14/22   Marguerita Merles Latif, DO  ALPRAZolam Prudy Feeler) 0.5 MG tablet Take 0.5 mg by mouth 3 (three) times daily. 02/25/15   [provider]  aspirin EC 81 MG tablet Take 1 tablet (81 mg total) by mouth daily with breakfast. 06/22/19   Shon Hale, MD  aspirin-acetaminophen-caffeine (EXCEDRIN MIGRAINE) [phone] MG tablet Take 1 tablet by mouth every 6 (six) hours as needed for headache or migraine.    [provider]  atorvastatin (LIPITOR) 20 MG tablet Take 20 mg by mouth daily. 03/31/19   [provider]  buPROPion (WELLBUTRIN XL) 300 MG 24 hr tablet Take 300 mg by mouth daily. 08/06/20   [provider]  dexlansoprazole (DEXILANT) 60 MG capsule Take 1 capsule by mouth daily. 12/28/21   [provider]  diclofenac Sodium (VOLTAREN) 1 % GEL Apply 2 g topically daily as needed (pain). 03/14/21   [provider]  diltiazem (TIAZAC) 420 MG 24 hr capsule Take 420 mg by mouth daily. 02/20/19   [provider]  DULoxetine (CYMBALTA) 60 MG capsule Take 60 mg by mouth daily.    [provider]  furosemide (LASIX) 20 MG tablet Take 20 mg by mouth daily. 11/25/19   [provider]  gabapentin (NEURONTIN) 800 MG tablet Take 800 mg by mouth 3 (three) times daily. 08/29/19   [provider]  hydrALAZINE (APRESOLINE) 10 MG tablet Take 10 mg by mouth See admin instructions. Take 1 tablet by mouth daily (orange tablet)    [provider]  insulin glargine (LANTUS) 100 UNIT/ML injection Inject 80 Units into the skin daily.    [provider]  metFORMIN (GLUCOPHAGE) 500 MG tablet Take 1,000 mg by mouth in the morning and at bedtime. In the morning & 1300    [provider]  Multiple Vitamin (MULTIVITAMIN WITH MINERALS) TABS tablet Take 1 tablet by mouth daily.    [provider]  ondansetron (ZOFRAN) 4 MG tablet Take 4 mg by mouth 4 (four) times daily. 03/04/22   [provider]  oxyCODONE (ROXICODONE) 15 MG immediate release tablet Take 15 mg by mouth every 4 (four) hours. 02/11/22   [provider]  polyethylene glycol powder (GLYCOLAX/MIRALAX) 17 GM/SCOOP powder Take 1 capful (17 g) by mouth daily as needed for mild constipation. 02/14/22   Marguerita Merles Latif, DO  pregabalin (LYRICA) 50 MG capsule Take 1 capsule (50 mg total) by mouth 3 (three) times daily. 12/05/22   Regal,  Kirstie Peri, DPM  sitaGLIPtin (JANUVIA) 100 MG tablet Take 100 mg by mouth daily.    [provider]  sulfamethoxazole-trimethoprim (BACTRIM DS) 800-160 MG tablet Take 1 tablet by mouth 2 (two) times daily. Patient taking differently: Take 1 tablet by mouth 2 (two) times daily. Start on 03/13/22 for 10 days supply 03/13/22   Edwin Cap, DPM  XTAMPZA ER 9 MG C12A Take 1 capsule by mouth 2 (two) times daily. 02/12/22   [provider]      Allergies    Latex    Review of Systems   Review of Systems  Genitourinary:  Positive for  flank pain.    Physical Exam Updated Vital Signs BP (!) 140/72   Pulse 89   Temp 98.2 F (36.8 C) (Oral)   Resp 14   Ht 5\' 9"  (1.753 m)   Wt 86.2 kg   SpO2 97%   BMI 28.06 kg/m  Physical Exam Vitals and nursing note reviewed.  Constitutional:      General: He is not in acute distress.    Appearance: He is well-developed.  HENT:     Head: Normocephalic and atraumatic.     Right Ear: External ear normal.     Left Ear: External ear normal.  Eyes:     Extraocular Movements: Extraocular movements intact.     Conjunctiva/sclera: Conjunctivae normal.  Cardiovascular:     Rate and Rhythm: Regular rhythm. Tachycardia present.     Pulses: Normal pulses.     Heart sounds: Normal heart sounds. No murmur heard. Pulmonary:     Effort: Pulmonary effort is normal. No respiratory distress.     Breath sounds: Normal breath sounds.  Abdominal:     General: Bowel sounds are normal.     Palpations: Abdomen is soft.     Tenderness: There is abdominal tenderness in the right lower quadrant. There is right CVA tenderness.  Musculoskeletal:        General: No swelling. Normal range of motion.     Cervical back: Normal range of motion and neck supple.  Skin:    General: Skin is warm and dry.     Capillary Refill: Capillary refill takes less than 2 seconds.  Neurological:     General: No focal deficit present.     Mental Status: He is alert.     Motor: No weakness.  Psychiatric:        Mood and Affect: Mood normal.     ED Results / Procedures / Treatments   Labs (all labs ordered are listed, but only abnormal results are displayed) Labs Reviewed  CBC WITH DIFFERENTIAL/PLATELET - Abnormal; Notable for the following components:      Result Value   Hemoglobin 11.7 (*)    HCT 37.7 (*)    RDW 17.0 (*)    All other components within normal limits  BASIC METABOLIC PANEL - Abnormal; Notable for the following components:   Potassium 3.3 (*)    Glucose, Bld 158 (*)    All other  components within normal limits  URINALYSIS, ROUTINE W REFLEX MICROSCOPIC - Abnormal; Notable for the following components:   Protein, ur 30 (*)    All other components within normal limits    EKG EKG Interpretation Date/Time:  Thursday January 01 2023 12:34:32 EST Ventricular Rate:  101 PR Interval:  156 QRS Duration:  96 QT Interval:  334 QTC Calculation: 433 R Axis:   60  Text Interpretation: Sinus tachycardia Otherwise normal ECG When  compared with ECG of 20-Mar-2022 14:56, No significant change since last tracing Confirmed by Meridee Score [phone]) on 01/01/2023 12:42:46 PM  Radiology CT Renal Stone Study Result Date: 01/01/2023 CLINICAL DATA:  Abdominal/flank pain, stone suspected EXAM: CT ABDOMEN AND PELVIS WITHOUT CONTRAST TECHNIQUE: Multidetector CT imaging of the abdomen and pelvis was performed following the standard protocol without IV contrast. RADIATION DOSE REDUCTION: This exam was performed according to the departmental dose-optimization program which includes automated exposure control, adjustment of the mA and/or kV according to patient size and/or use of iterative reconstruction technique. COMPARISON:  February 24, 2017 FINDINGS: Evaluation is limited by lack of IV contrast and suboptimal positioning with resultant streak artifact. Lower chest: No acute abnormality. Hepatobiliary: Status post cholecystectomy. Dilated common bile duct measuring up to 11 mm, likely due to reservoir affect. Pancreas: Fatty infiltration of the pancreas. No peripancreatic fat stranding. Spleen: Unremarkable. Adrenals/Urinary Tract: Adrenal glands are unremarkable. No hydronephrosis. No obstructing nephrolithiasis. Punctate bilateral nephrolithiasis. Bladder is unremarkable. Stomach/Bowel: No evidence of bowel obstruction. Diverticulosis most pronounced in the descending and sigmoid colon. Appendix is normal. Moderate colonic stool burden. Vascular/Lymphatic: Atherosclerotic calcifications of the  nonaneurysmal abdominal aorta. No new suspicious lymphadenopathy. Reproductive: Prostate is present. Other: No free air or free fluid. Musculoskeletal: Status post posterior fixation with intervertebral spacer placement of L3-4. 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 Severe multilevel degenerative changes of the lumbar spine with levocurvature centered at L2-3. IMPRESSION: 1. No acute abnormality of the abdomen or pelvis. 2. Punctate bilateral nephrolithiasis. No hydronephrosis. Aortic Atherosclerosis (ICD10-I70.0). Electronically Signed   By: Meda Klinefelter M.D.   On: 01/01/2023 16:11    Procedures Procedures    Medications Ordered in ED Medications  morphine (PF) 4 MG/ML injection 4 mg (4 mg Intravenous Given 01/01/23 1531)  ondansetron (ZOFRAN) injection 4 mg (4 mg Intravenous Given 01/01/23 1531)    ED Course/ Medical Decision Making/ A&P                                 Medical Decision Making Amount and/or Complexity of Data Reviewed Labs: ordered. Radiology: ordered.  Risk Prescription drug management.   This patient presents to the ED with chief complaint(s) of right flank pain with pertinent past medical history of kidney stones which further complicates the presenting complaint. The complaint involves an extensive differential diagnosis and also carries with it a high risk of complications and morbidity.    The differential diagnosis includes appendicitis, kidney stone, UTI, flank pain  Additional history obtained: Records reviewed podiatry office visit notes  ED Course and Reassessment:   Independent labs interpretation:  The following labs were independently interpreted:  EKG: Sinus tach BMP: Mild hypokalemia CBC: Anemia at 11.7 which is chronic per historical values UA: 30 protein  Independent visualization of imaging: - I independently visualized the following imaging with scope of interpretation limited to determining acute life threatening conditions related to  emergency care: CT renal stone study, which revealed no acute abnormality of the abdomen or pelvis.  Punctate bilateral nephrolithiasis without hydronephrosis  Consultation: - Consulted or discussed management/test interpretation w/ external professional: None  Consideration for admission or further workup: None today for admission or further workup however patient's vital signs, physical exam, labs, and imaging of all reassuring.  Patient should alternate taking Tylenol and Motrin as needed for pain.         Final Clinical Impression(s) / ED Diagnoses Final diagnoses:  Flank pain  Rx / DC Orders ED Discharge Orders     None         Gretta Began 01/01/23 1734    Terrilee Files, MD 01/02/23 1740

## 2023-01-05 DIAGNOSIS — I700 Atherosclerosis of aorta: Secondary | ICD-10-CM | POA: Diagnosis not present

## 2023-01-05 DIAGNOSIS — M67911 Unspecified disorder of synovium and tendon, right shoulder: Secondary | ICD-10-CM | POA: Diagnosis not present

## 2023-01-05 DIAGNOSIS — Z89432 Acquired absence of left foot: Secondary | ICD-10-CM | POA: Diagnosis not present

## 2023-01-05 DIAGNOSIS — E1140 Type 2 diabetes mellitus with diabetic neuropathy, unspecified: Secondary | ICD-10-CM | POA: Diagnosis not present

## 2023-01-05 DIAGNOSIS — M869 Osteomyelitis, unspecified: Secondary | ICD-10-CM | POA: Diagnosis not present

## 2023-01-05 DIAGNOSIS — E1129 Type 2 diabetes mellitus with other diabetic kidney complication: Secondary | ICD-10-CM | POA: Diagnosis not present

## 2023-01-05 DIAGNOSIS — I10 Essential (primary) hypertension: Secondary | ICD-10-CM | POA: Diagnosis not present

## 2023-01-05 DIAGNOSIS — G894 Chronic pain syndrome: Secondary | ICD-10-CM | POA: Diagnosis not present

## 2023-01-05 DIAGNOSIS — E1142 Type 2 diabetes mellitus with diabetic polyneuropathy: Secondary | ICD-10-CM | POA: Diagnosis not present

## 2023-01-11 IMAGING — DX DG FOOT COMPLETE 3+V*R*
3 series · 3 of 3 positions shown · non-contrast
Comparison: Right foot radiographs 01/26/2021

CLINICAL DATA: Pain and bleeding at surgical site. Recent right
great toe amputation.

EXAM:
RIGHT FOOT COMPLETE - 3+ VIEW

[foot ap]
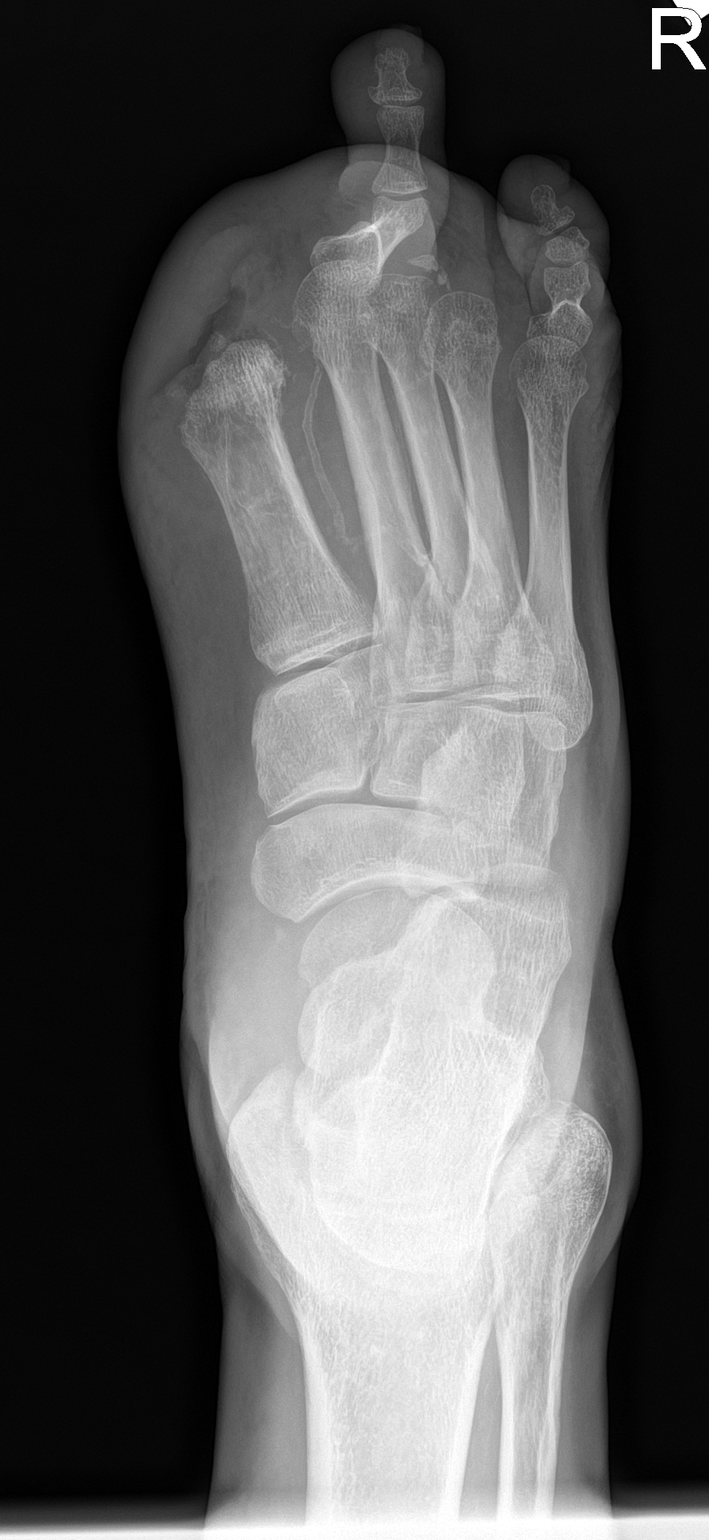

[foot obl]
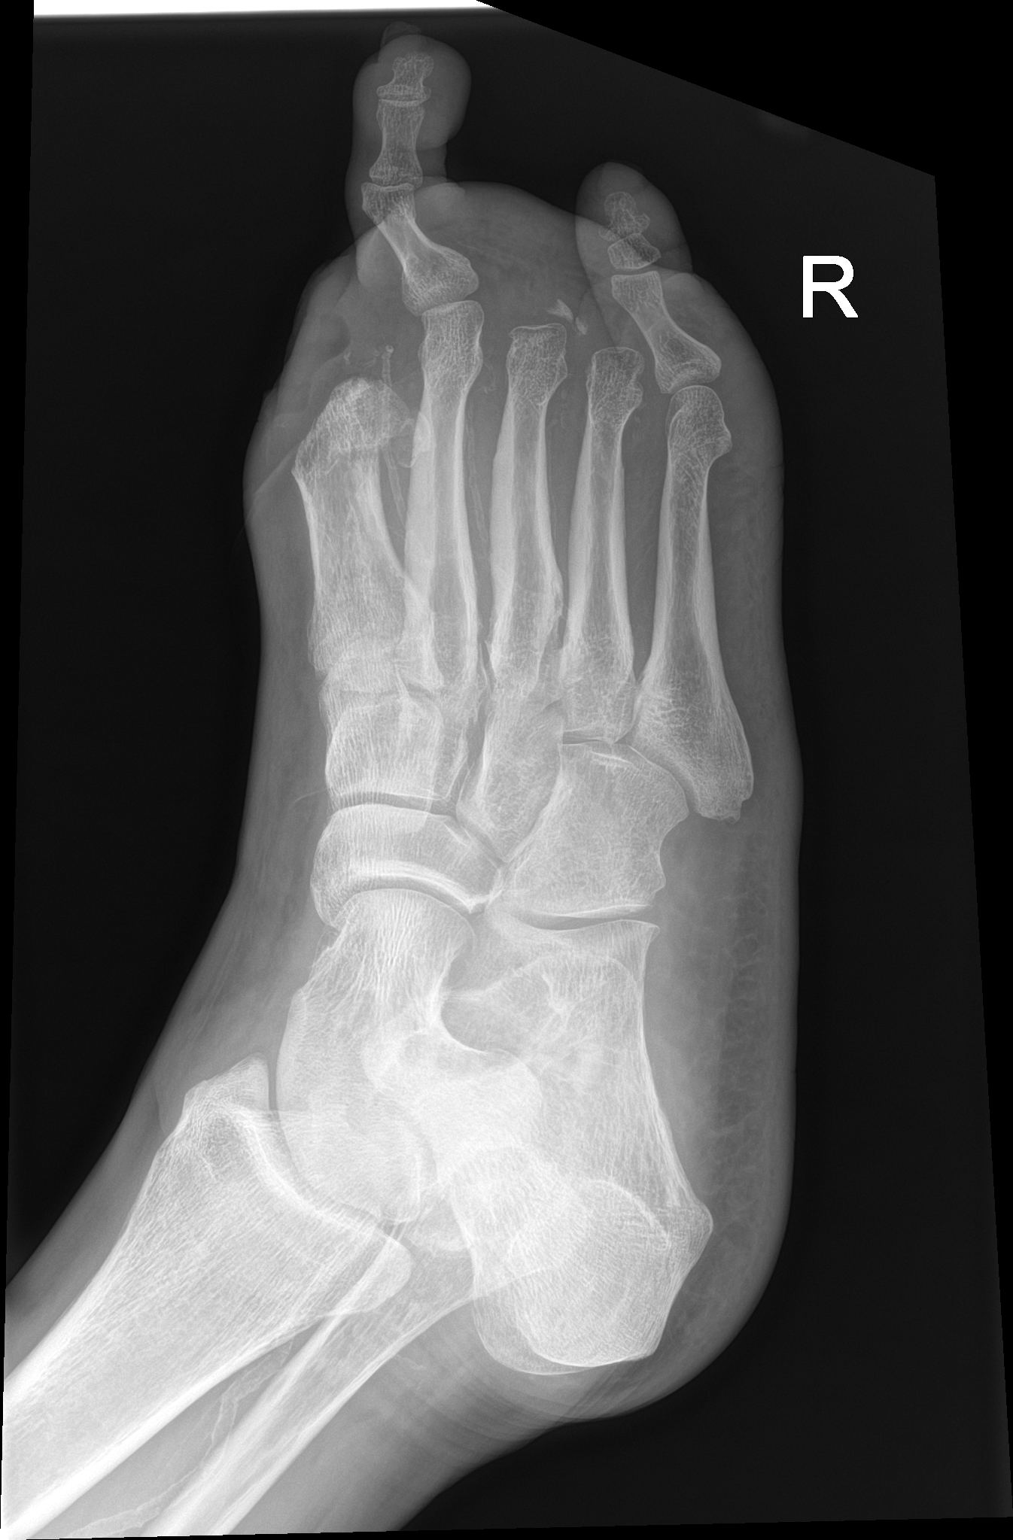

[foot lat]
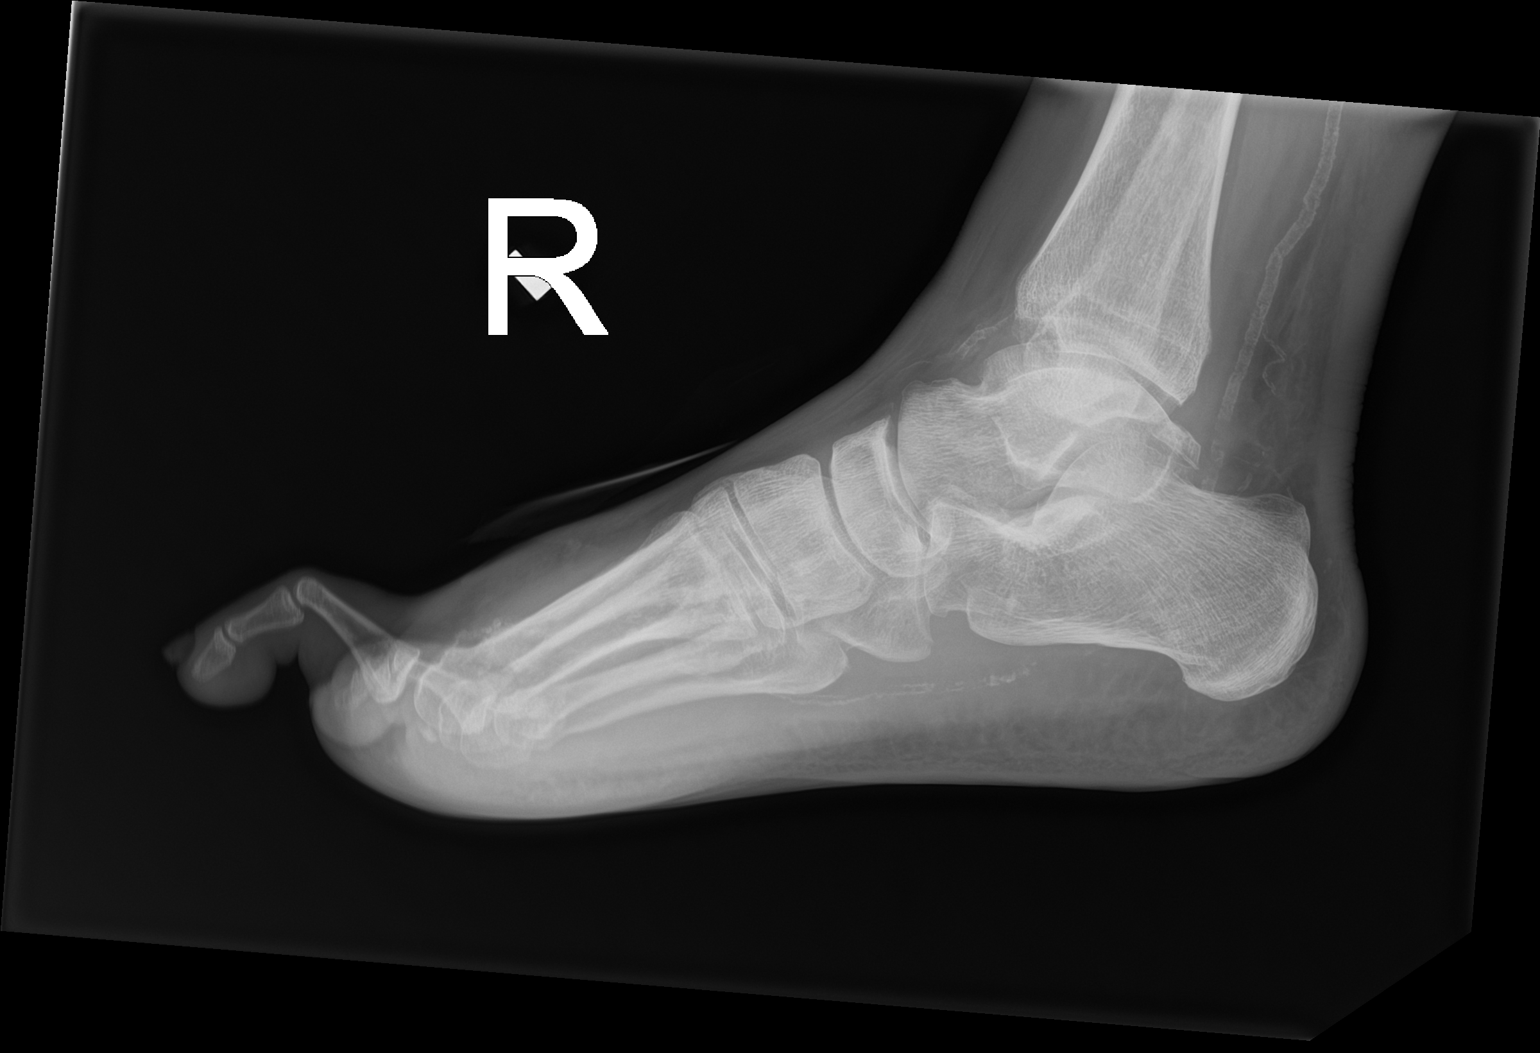

[3 of 3 positions shown; findings below may reference images not displayed]

FINDINGS: Sequelae of interval great toe amputation are identified at the
level of the metatarsal head. Previous amputation is again noted at
the third and fourth toes at the MTP joints. No acute fracture,
dislocation, or osseous destruction is identified. Atherosclerotic
vascular calcifications are noted. No dissecting subcutaneous
emphysema is evident.
IMPRESSION: Interval great toe amputation without evidence of acute osseous
abnormality.

## 2023-01-26 DIAGNOSIS — E1129 Type 2 diabetes mellitus with other diabetic kidney complication: Secondary | ICD-10-CM | POA: Diagnosis not present

## 2023-01-26 DIAGNOSIS — G894 Chronic pain syndrome: Secondary | ICD-10-CM | POA: Diagnosis not present

## 2023-01-26 DIAGNOSIS — E1140 Type 2 diabetes mellitus with diabetic neuropathy, unspecified: Secondary | ICD-10-CM | POA: Diagnosis not present

## 2023-01-26 DIAGNOSIS — I10 Essential (primary) hypertension: Secondary | ICD-10-CM | POA: Diagnosis not present

## 2023-01-26 DIAGNOSIS — M4306 Spondylolysis, lumbar region: Secondary | ICD-10-CM | POA: Diagnosis not present

## 2023-01-26 DIAGNOSIS — J329 Chronic sinusitis, unspecified: Secondary | ICD-10-CM | POA: Diagnosis not present

## 2023-01-26 DIAGNOSIS — Z89432 Acquired absence of left foot: Secondary | ICD-10-CM | POA: Diagnosis not present

## 2023-01-26 DIAGNOSIS — I700 Atherosclerosis of aorta: Secondary | ICD-10-CM | POA: Diagnosis not present

## 2023-01-27 DIAGNOSIS — M4316 Spondylolisthesis, lumbar region: Secondary | ICD-10-CM | POA: Diagnosis not present

## 2023-02-26 DIAGNOSIS — J329 Chronic sinusitis, unspecified: Secondary | ICD-10-CM | POA: Diagnosis not present

## 2023-02-26 DIAGNOSIS — E1129 Type 2 diabetes mellitus with other diabetic kidney complication: Secondary | ICD-10-CM | POA: Diagnosis not present

## 2023-02-26 DIAGNOSIS — I10 Essential (primary) hypertension: Secondary | ICD-10-CM | POA: Diagnosis not present

## 2023-02-26 DIAGNOSIS — I700 Atherosclerosis of aorta: Secondary | ICD-10-CM | POA: Diagnosis not present

## 2023-02-26 DIAGNOSIS — E1142 Type 2 diabetes mellitus with diabetic polyneuropathy: Secondary | ICD-10-CM | POA: Diagnosis not present

## 2023-02-26 DIAGNOSIS — E1140 Type 2 diabetes mellitus with diabetic neuropathy, unspecified: Secondary | ICD-10-CM | POA: Diagnosis not present

## 2023-02-26 DIAGNOSIS — M67911 Unspecified disorder of synovium and tendon, right shoulder: Secondary | ICD-10-CM | POA: Diagnosis not present

## 2023-02-26 DIAGNOSIS — G894 Chronic pain syndrome: Secondary | ICD-10-CM | POA: Diagnosis not present

## 2023-03-09 DIAGNOSIS — M5416 Radiculopathy, lumbar region: Secondary | ICD-10-CM | POA: Diagnosis not present

## 2023-03-11 ENCOUNTER — Ambulatory Visit: Payer: 59 | Admitting: Podiatry

## 2023-03-13 ENCOUNTER — Ambulatory Visit (INDEPENDENT_AMBULATORY_CARE_PROVIDER_SITE_OTHER): Payer: 59 | Admitting: Podiatry

## 2023-03-13 ENCOUNTER — Encounter: Payer: Self-pay | Admitting: Podiatry

## 2023-03-13 DIAGNOSIS — M7752 Other enthesopathy of left foot: Secondary | ICD-10-CM | POA: Diagnosis not present

## 2023-03-13 DIAGNOSIS — M779 Enthesopathy, unspecified: Secondary | ICD-10-CM | POA: Diagnosis not present

## 2023-03-13 DIAGNOSIS — M7751 Other enthesopathy of right foot: Secondary | ICD-10-CM

## 2023-03-13 MED ORDER — TRIAMCINOLONE ACETONIDE 10 MG/ML IJ SUSP
10.0000 mg | Freq: Once | INTRAMUSCULAR | Status: AC
  Administered 2023-03-13: 10 mg via INTRA_ARTICULAR

## 2023-03-14 NOTE — Progress Notes (Signed)
 Subjective:   Patient ID: [name], male   DOB: 63 y.o.   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  Today I did explain risk he wants to continue along the same path I did sterile prep I injected the dorsal midtarsal joint tendon complex 3 mg dexamethasone Kenalog 5 mg Xylocaine advised on reduced activity reappoint as needed

## 2023-03-25 DIAGNOSIS — E1140 Type 2 diabetes mellitus with diabetic neuropathy, unspecified: Secondary | ICD-10-CM | POA: Diagnosis not present

## 2023-03-25 DIAGNOSIS — E1129 Type 2 diabetes mellitus with other diabetic kidney complication: Secondary | ICD-10-CM | POA: Diagnosis not present

## 2023-03-25 DIAGNOSIS — E11628 Type 2 diabetes mellitus with other skin complications: Secondary | ICD-10-CM | POA: Diagnosis not present

## 2023-03-25 DIAGNOSIS — M4306 Spondylolysis, lumbar region: Secondary | ICD-10-CM | POA: Diagnosis not present

## 2023-03-25 DIAGNOSIS — I700 Atherosclerosis of aorta: Secondary | ICD-10-CM | POA: Diagnosis not present

## 2023-03-25 DIAGNOSIS — E1142 Type 2 diabetes mellitus with diabetic polyneuropathy: Secondary | ICD-10-CM | POA: Diagnosis not present

## 2023-03-25 DIAGNOSIS — I10 Essential (primary) hypertension: Secondary | ICD-10-CM | POA: Diagnosis not present

## 2023-03-25 DIAGNOSIS — G894 Chronic pain syndrome: Secondary | ICD-10-CM | POA: Diagnosis not present

## 2023-04-24 DIAGNOSIS — E11628 Type 2 diabetes mellitus with other skin complications: Secondary | ICD-10-CM | POA: Diagnosis not present

## 2023-04-24 DIAGNOSIS — E1129 Type 2 diabetes mellitus with other diabetic kidney complication: Secondary | ICD-10-CM | POA: Diagnosis not present

## 2023-04-24 DIAGNOSIS — E1140 Type 2 diabetes mellitus with diabetic neuropathy, unspecified: Secondary | ICD-10-CM | POA: Diagnosis not present

## 2023-04-24 DIAGNOSIS — G43011 Migraine without aura, intractable, with status migrainosus: Secondary | ICD-10-CM | POA: Diagnosis not present

## 2023-04-24 DIAGNOSIS — I700 Atherosclerosis of aorta: Secondary | ICD-10-CM | POA: Diagnosis not present

## 2023-04-24 DIAGNOSIS — M5416 Radiculopathy, lumbar region: Secondary | ICD-10-CM | POA: Diagnosis not present

## 2023-04-24 DIAGNOSIS — E1142 Type 2 diabetes mellitus with diabetic polyneuropathy: Secondary | ICD-10-CM | POA: Diagnosis not present

## 2023-04-28 DIAGNOSIS — E11628 Type 2 diabetes mellitus with other skin complications: Secondary | ICD-10-CM | POA: Diagnosis not present

## 2023-04-28 DIAGNOSIS — M4316 Spondylolisthesis, lumbar region: Secondary | ICD-10-CM | POA: Diagnosis not present

## 2023-04-28 DIAGNOSIS — M5416 Radiculopathy, lumbar region: Secondary | ICD-10-CM | POA: Diagnosis not present

## 2023-04-28 DIAGNOSIS — D649 Anemia, unspecified: Secondary | ICD-10-CM | POA: Diagnosis not present

## 2023-05-07 ENCOUNTER — Telehealth: Payer: Self-pay | Admitting: *Deleted

## 2023-05-07 ENCOUNTER — Encounter: Payer: Self-pay | Admitting: Internal Medicine

## 2023-05-07 ENCOUNTER — Ambulatory Visit: Admitting: Internal Medicine

## 2023-05-07 ENCOUNTER — Encounter: Payer: Self-pay | Admitting: *Deleted

## 2023-05-07 VITALS — BP 148/82 | HR 99 | Temp 98.7°F | Ht 68.0 in | Wt 188.0 lb

## 2023-05-07 DIAGNOSIS — Z860101 Personal history of adenomatous and serrated colon polyps: Secondary | ICD-10-CM | POA: Diagnosis not present

## 2023-05-07 DIAGNOSIS — T402X5A Adverse effect of other opioids, initial encounter: Secondary | ICD-10-CM

## 2023-05-07 DIAGNOSIS — R112 Nausea with vomiting, unspecified: Secondary | ICD-10-CM

## 2023-05-07 DIAGNOSIS — D509 Iron deficiency anemia, unspecified: Secondary | ICD-10-CM | POA: Diagnosis not present

## 2023-05-07 DIAGNOSIS — R1310 Dysphagia, unspecified: Secondary | ICD-10-CM

## 2023-05-07 DIAGNOSIS — K219 Gastro-esophageal reflux disease without esophagitis: Secondary | ICD-10-CM

## 2023-05-07 DIAGNOSIS — K5903 Drug induced constipation: Secondary | ICD-10-CM | POA: Diagnosis not present

## 2023-05-07 DIAGNOSIS — R1319 Other dysphagia: Secondary | ICD-10-CM

## 2023-05-07 NOTE — Telephone Encounter (Signed)
 LMOVM to call back to schedule TCS/EGD/ED with Dr. Mordechai April, ASA 3

## 2023-05-07 NOTE — Telephone Encounter (Signed)
 Spoke with pt. He has been scheduled for 4/28. He will come by office to pick up samples/instructions next week for procedure.

## 2023-05-07 NOTE — Progress Notes (Signed)
 Primary Care Physician:  Kathyleen Parkins, MD Primary Gastroenterologist:  Dr. Mordechai April  Chief Complaint  Patient presents with   Emesis    Having issues with nausea and vomiting for several weeks, also states that his stools are black at times (admits to taking pepto) and is having issues with constipation.     HPI:   [name] is a 63 y.o. male who presents to the clinic today by referral from his PCP Dr. Manson Seitz for evaluation.   Chronic GERD, esophageal dysphagia: Chronic GERD for many years.  Currently taking dexlansoprazole 60 mg daily.  Symptoms relatively well-controlled.  Does note progressively worsening esophageal dysphagia.  Feels like food will get stuck in his substernal region.  Also notes recently issues with nausea and vomiting.  Taking Pepto to help with this.  History of adenomatous colon polyps: Last colonoscopy 03/31/2013 with 3 polyps removed.  Tubular adenoma on path.  No family history of colorectal malignancy.  No melena hematochezia.  No unintentional weight loss.  Chronic constipation: Worsened as of late, notes going multiple days without bowel movements.  Occasional straining.  Bristol stool 1.  Takes chronic opioids.    Past Medical History:  Diagnosis Date   Acute biliary pancreatitis 10/23/2013   Acute osteomyelitis of right foot (HCC) 05/22/2021   AKI (acute kidney injury) (HCC) 05/22/2021   Arthritis    Biliary colic 10/23/2013   Chronic osteomyelitis of toe of right foot (HCC)    Diabetes mellitus    GERD (gastroesophageal reflux disease)    Headache(784.0)    History of kidney stones    Hypertension    Kidney stones    Neuropathy    Osteomyelitis of fourth toe of right foot (HCC) 06/16/2019    Past Surgical History:  Procedure Laterality Date   ABDOMINAL AORTOGRAM W/LOWER EXTREMITY N/A 02/10/2022   Procedure: ABDOMINAL AORTOGRAM W/LOWER EXTREMITY;  Surgeon: Kayla Part, MD;  Location: [facility] INVASIVE CV LAB;  Service:  Cardiovascular;  Laterality: N/A;   ACHILLES TENDON SURGERY Right 05/26/2021   Procedure: ACHILLES LENGTHENING/KIDNER;  Surgeon: Floyce Hutching, DPM;  Location: [facility] OR;  Service: Podiatry;  Laterality: Right;   ACHILLES TENDON SURGERY Left 02/11/2022   Procedure: ACHILLES LENGTHENING/KIDNER;  Surgeon: Floyce Hutching, DPM;  Location: [facility] OR;  Service: Podiatry;  Laterality: Left;   AMPUTATION Left 02/11/2022   Procedure: LEFT TRANSMETATARSAL AMPUTATION;  Surgeon: Floyce Hutching, DPM;  Location: [facility] OR;  Service: Podiatry;  Laterality: Left;   AMPUTATION TOE Right 06/21/2019   Procedure: AMPUTATION TOE;  Surgeon: Alanda Allegra, MD;  Location: [facility]S;  Service: General;  Laterality: Right;   AMPUTATION TOE Right 08/29/2020   Procedure: RIGHT THIRD AMPUTATION TOE;  Surgeon: Alanda Allegra, MD;  Location: [facility];  Service: General;  Laterality: Right;  third toe   AMPUTATION TOE Right 02/20/2021   Procedure: AMPUTATION  TRANSMATARSAL  RIGHT; GREAT TOE;  Surgeon: Alanda Allegra, MD;  Location: [facility];  Service: General;  Laterality: Right;   AMPUTATION TOE Left 02/07/2022   Procedure: AMPUTATION TOE;  Surgeon: Floyce Hutching, DPM;  Location: [facility] OR;  Service: Podiatry;  Laterality: Left;  amputation of remainder of Left 4th toe   BACK SURGERY  2022   CARDIAC CATHETERIZATION  01/21/2007   CHOLECYSTECTOMY N/A 10/24/2013   Procedure: LAPAROSCOPIC CHOLECYSTECTOMY;  Surgeon: Beau Bound, MD;  Location: [facility];  Service: General;  Laterality: N/A;   COLONOSCOPY N/A 03/31/2013   Procedure: COLONOSCOPY;  Surgeon: Mathews Solomons  Homero Luster, MD;  Location: [facility];  Service: Endoscopy;  Laterality: N/A;  730   IRRIGATION AND DEBRIDEMENT FOOT Right 04/19/2021   Procedure: IRRIGATION AND DEBRIDEMENT RIGHT SECOND TOE;  Surgeon: Alanda Allegra, MD;  Location: [facility];  Service: General;  Laterality: Right;   SECONDARY CLOSURE OF WOUND Right 04/19/2021   Procedure: IRRIGATION AND DEBRIDEMENT SECONDARY CLOSURE OF RIGHT  GREAT TOE WOUND;  Surgeon: Alanda Allegra, MD;  Location: [facility];  Service: General;  Laterality: Right;   TRANSMETATARSAL AMPUTATION Right 05/24/2021   Procedure: TRANSMETATARSAL AMPUTATION;  Surgeon: Floyce Hutching, DPM;  Location: [facility] OR;  Service: Podiatry;  Laterality: Right;   TRANSMETATARSAL AMPUTATION Right 05/26/2021   Procedure: TRANSMETATARSAL AMPUTATION Revision;  Surgeon: Floyce Hutching, DPM;  Location: [facility] OR;  Service: Podiatry;  Laterality: Right;    Current Outpatient Medications  Medication Sig Dispense Refill   ALPRAZolam  (XANAX ) 0.5 MG tablet Take 0.5 mg by mouth 3 (three) times daily.  2   aspirin  EC 81 MG tablet Take 1 tablet (81 mg total) by mouth daily with breakfast. 120 tablet 3   aspirin -acetaminophen -caffeine  (EXCEDRIN  MIGRAINE) 250-250-65 MG tablet Take 1 tablet by mouth every 6 (six) hours as needed for headache or migraine.     atorvastatin  (LIPITOR ) 20 MG tablet Take 20 mg by mouth daily.     buPROPion  (WELLBUTRIN  XL) 300 MG 24 hr tablet Take 300 mg by mouth daily.     dexlansoprazole (DEXILANT) 60 MG capsule Take 1 capsule by mouth daily.     diclofenac Sodium (VOLTAREN) 1 % GEL Apply 2 g topically daily as needed (pain).     diltiazem  (TIAZAC ) 420 MG 24 hr capsule Take 420 mg by mouth daily.     DULoxetine  (CYMBALTA ) 60 MG capsule Take 60 mg by mouth daily.     furosemide  (LASIX ) 20 MG tablet Take 20 mg by mouth daily.     hydrALAZINE  (APRESOLINE ) 10 MG tablet Take 10 mg by mouth See admin instructions. Take 1 tablet by mouth daily (orange tablet)     insulin  glargine (LANTUS ) 100 UNIT/ML injection Inject 80 Units into the skin daily.     metFORMIN  (GLUCOPHAGE ) 500 MG tablet Take 1,000 mg by mouth in the morning and at bedtime. In the morning & 1300     Multiple Vitamin (MULTIVITAMIN WITH MINERALS) TABS tablet Take 1 tablet by mouth daily.     ondansetron  (ZOFRAN ) 4 MG tablet Take 4 mg by mouth 4 (four) times daily.     oxyCODONE  (ROXICODONE ) 15 MG immediate  release tablet Take 15 mg by mouth every 4 (four) hours.     pregabalin  (LYRICA ) 50 MG capsule Take 1 capsule (50 mg total) by mouth 3 (three) times daily. 60 capsule 4   sitaGLIPtin (JANUVIA) 100 MG tablet Take 100 mg by mouth daily.     XTAMPZA  ER 18 MG C12A Take 1 capsule by mouth 2 (two) times daily.     No current facility-administered medications for this visit.    Allergies as of 05/07/2023 - Review Complete 05/07/2023  Allergen Reaction Noted   Latex Rash and Other (See Comments) 10/05/2019    Family History  Problem Relation Age of Onset   Diabetes Mother    Heart failure Mother    Heart disease Mother    Heart failure Father     Social History   Socioeconomic History   Marital status: Divorced    Spouse name: Not on file   Number of children: Not  on file   Years of education: Not on file   Highest education level: Not on file  Occupational History   Not on file  Tobacco Use   Smoking status: Former    Current packs/day: 0.00    Average packs/day: 1 pack/day for 7.0 years (7.0 ttl pk-yrs)    Types: Cigarettes    Start date: 01/31/1975    Quit date: 01/30/1982    Years since quitting: 41.2   Smokeless tobacco: Never  Vaping Use   Vaping status: Never Used  Substance and Sexual Activity   Alcohol use: No   Drug use: No   Sexual activity: Not on file  Other Topics Concern   Not on file  Social History Narrative   Not on file   Social Drivers of Health   Financial Resource Strain: Not on file  Food Insecurity: Not on file  Transportation Needs: Not on file  Physical Activity: Not on file  Stress: Not on file  Social Connections: Unknown (06/04/2021)   Received from [facility]   Social Network    Social Network: Not on file  Intimate Partner Violence: Unknown (04/26/2021)   Received from [facility], [facility]   HITS    Physically Hurt: Not on file    Insult or Talk Down To: Not on file    Threaten Physical Harm: Not on file     Scream or Curse: Not on file    Subjective: Review of Systems  Constitutional:  Negative for chills and fever.  HENT:  Negative for congestion and hearing loss.   Eyes:  Negative for blurred vision and double vision.  Respiratory:  Negative for cough and shortness of breath.   Cardiovascular:  Negative for chest pain and palpitations.  Gastrointestinal:  Positive for constipation, heartburn, nausea and vomiting. Negative for abdominal pain, blood in stool, diarrhea and melena.  Genitourinary:  Negative for dysuria and urgency.  Musculoskeletal:  Negative for joint pain and myalgias.  Skin:  Negative for itching and rash.  Neurological:  Negative for dizziness and headaches.  Psychiatric/Behavioral:  Negative for depression. The patient is not nervous/anxious.        Objective: BP (!) 148/82 (BP Location: Right Arm, Patient Position: Sitting, Cuff Size: Normal)   Pulse 99   Temp 98.7 F (37.1 C) (Oral)   Ht 5\' 8"  (1.727 m)   Wt 188 lb (85.3 kg)   SpO2 97%   BMI 28.59 kg/m  Physical Exam Constitutional:      Appearance: Normal appearance.  HENT:     Head: Normocephalic and atraumatic.  Eyes:     Extraocular Movements: Extraocular movements intact.     Conjunctiva/sclera: Conjunctivae normal.  Cardiovascular:     Rate and Rhythm: Normal rate and regular rhythm.  Pulmonary:     Effort: Pulmonary effort is normal.     Breath sounds: Normal breath sounds.  Abdominal:     General: Bowel sounds are normal.     Palpations: Abdomen is soft.  Musculoskeletal:        General: Normal range of motion.     Cervical back: Normal range of motion and neck supple.  Skin:    General: Skin is warm.  Neurological:     General: No focal deficit present.     Mental Status: He is alert and oriented to person, place, and time.  Psychiatric:        Mood and Affect: Mood normal.  Behavior: Behavior normal.      Assessment/Plan:  1.  Dysphagia, GERD, nausea vomiting- Will  schedule for EGD with possible dilation to evaluate for peptic ulcer disease, esophagitis, gastritis, H. Pylori, duodenitis, or other. Will also evaluate for esophageal stricture, Schatzki's ring, esophageal web or other.   The risks including infection, bleed, or perforation as well as benefits, limitations, alternatives and imponderables have been reviewed with the patient. Potential for esophageal dilation, biopsy, etc. have also been reviewed.  Questions have been answered. All parties agreeable.  Continue on Dexilant daily.  2.  History of adenomatous colon polyps, anemia-at same time as upper endoscopy, we will perform colonoscopy to further evaluate.  3.  Constipation-likely drug-induced from chronic opioid use.  Will give samples of Linzess  290 mcg daily and see how he does.  Call with update next week and I can send in formal prescription if improved.  Follow-up after procedures. 05/07/2023 9:47 AM   Disclaimer: This note was dictated with voice recognition software. Similar sounding words can inadvertently be transcribed and may not be corrected upon review.

## 2023-05-07 NOTE — Patient Instructions (Signed)
 We will schedule you for upper endoscopy and colonoscopy to further evaluate your iron deficiency anemia, history of colon polyps, GERD, nausea vomiting and feeling of food getting stuck in your esophagus.  I may elect to stretch your esophagus depending on findings.  For your constipation I am going to give you samples of Linzess 290 mcg daily.  We have room to decrease dose if this is too strong.  Let me know in a week or so how you are doing and I can send in formal prescription.  Linzess works best when taken once a day every day, on an empty stomach, at least 30 minutes before your first meal of the day.  When Linzess is taken daily as directed:  *Constipation relief is typically felt in about a week *IBS-C patients may begin to experience relief from belly pain and overall abdominal symptoms (pain, discomfort, and bloating) in about 1 week,   with symptoms typically improving over 12 weeks.  Diarrhea may occur in the first 2 weeks -keep taking it.  The diarrhea should go away and you should start having normal, complete, full bowel movements. It may be helpful to start treatment when you can be near the comfort of your own bathroom, such as a weekend.   It was very nice meeting both you today.  Dr. Mordechai April

## 2023-05-15 ENCOUNTER — Encounter (HOSPITAL_COMMUNITY)
Admission: RE | Admit: 2023-05-15 | Discharge: 2023-05-15 | Disposition: A | Discharge status: Home / Self Care | Source: Ambulatory Visit

## 2023-05-15 ENCOUNTER — Encounter (HOSPITAL_COMMUNITY): Payer: Self-pay

## 2023-05-15 HISTORY — DX: Anxiety disorder, unspecified: F41.9

## 2023-05-15 HISTORY — DX: Other specified postprocedural states: Z98.890

## 2023-05-15 HISTORY — DX: Depression, unspecified: F32.A

## 2023-05-15 HISTORY — DX: Nausea with vomiting, unspecified: R11.2

## 2023-05-18 ENCOUNTER — Encounter (HOSPITAL_COMMUNITY): Payer: Self-pay | Admitting: Internal Medicine

## 2023-05-18 ENCOUNTER — Ambulatory Visit (HOSPITAL_COMMUNITY): Admitting: Anesthesiology

## 2023-05-18 ENCOUNTER — Encounter (HOSPITAL_COMMUNITY)
Admission: RE | Disposition: A | Discharge status: Home / Self Care | Payer: Self-pay | Source: Home / Self Care | Attending: Internal Medicine

## 2023-05-18 ENCOUNTER — Telehealth: Payer: Self-pay | Admitting: *Deleted

## 2023-05-18 ENCOUNTER — Ambulatory Visit (HOSPITAL_COMMUNITY)
Admission: RE | Admit: 2023-05-18 | Discharge: 2023-05-18 | Disposition: A | Discharge status: Home / Self Care | Attending: Internal Medicine | Admitting: Internal Medicine

## 2023-05-18 DIAGNOSIS — R112 Nausea with vomiting, unspecified: Secondary | ICD-10-CM | POA: Diagnosis not present

## 2023-05-18 DIAGNOSIS — K2970 Gastritis, unspecified, without bleeding: Secondary | ICD-10-CM | POA: Insufficient documentation

## 2023-05-18 DIAGNOSIS — E119 Type 2 diabetes mellitus without complications: Secondary | ICD-10-CM | POA: Diagnosis not present

## 2023-05-18 DIAGNOSIS — K219 Gastro-esophageal reflux disease without esophagitis: Secondary | ICD-10-CM | POA: Insufficient documentation

## 2023-05-18 DIAGNOSIS — Z794 Long term (current) use of insulin: Secondary | ICD-10-CM | POA: Insufficient documentation

## 2023-05-18 DIAGNOSIS — K5730 Diverticulosis of large intestine without perforation or abscess without bleeding: Secondary | ICD-10-CM | POA: Insufficient documentation

## 2023-05-18 DIAGNOSIS — I10 Essential (primary) hypertension: Secondary | ICD-10-CM | POA: Insufficient documentation

## 2023-05-18 DIAGNOSIS — F32A Depression, unspecified: Secondary | ICD-10-CM | POA: Insufficient documentation

## 2023-05-18 DIAGNOSIS — F419 Anxiety disorder, unspecified: Secondary | ICD-10-CM | POA: Diagnosis not present

## 2023-05-18 DIAGNOSIS — Z87891 Personal history of nicotine dependence: Secondary | ICD-10-CM | POA: Insufficient documentation

## 2023-05-18 DIAGNOSIS — Z7984 Long term (current) use of oral hypoglycemic drugs: Secondary | ICD-10-CM | POA: Diagnosis not present

## 2023-05-18 DIAGNOSIS — D509 Iron deficiency anemia, unspecified: Secondary | ICD-10-CM | POA: Diagnosis not present

## 2023-05-18 DIAGNOSIS — E1140 Type 2 diabetes mellitus with diabetic neuropathy, unspecified: Secondary | ICD-10-CM | POA: Insufficient documentation

## 2023-05-18 DIAGNOSIS — K222 Esophageal obstruction: Secondary | ICD-10-CM | POA: Diagnosis not present

## 2023-05-18 DIAGNOSIS — R1310 Dysphagia, unspecified: Secondary | ICD-10-CM | POA: Diagnosis not present

## 2023-05-18 HISTORY — PX: ESOPHAGOGASTRODUODENOSCOPY: SHX5428

## 2023-05-18 HISTORY — PX: COLONOSCOPY: SHX5424

## 2023-05-18 HISTORY — PX: ESOPHAGEAL DILATION: SHX303

## 2023-05-18 LAB — GLUCOSE, CAPILLARY
Glucose-Capillary: 80 mg/dL (ref 70–99)
Glucose-Capillary: 78 mg/dL (ref 70–99)

## 2023-05-18 SURGERY — COLONOSCOPY
Anesthesia: General

## 2023-05-18 MED ORDER — FENTANYL CITRATE (PF) 100 MCG/2ML IJ SOLN
INTRAMUSCULAR | Status: AC
  Filled 2023-05-18: qty 2

## 2023-05-18 MED ORDER — LINACLOTIDE 290 MCG PO CAPS: 290.0000 ug | ORAL_CAPSULE | Freq: Every day | ORAL | 3 refills | Status: DC

## 2023-05-18 MED ORDER — PROPOFOL 10 MG/ML IV BOLUS
INTRAVENOUS | Status: DC | PRN
  Administered 2023-05-18: 100 mg via INTRAVENOUS

## 2023-05-18 MED ORDER — PROPOFOL 500 MG/50ML IV EMUL: INTRAVENOUS | Status: DC | PRN

## 2023-05-18 MED ORDER — FENTANYL CITRATE (PF) 100 MCG/2ML IJ SOLN
INTRAMUSCULAR | Status: DC | PRN
Start: 2023-05-18 — End: 2023-05-18
  Administered 2023-05-18: 100 ug via INTRAVENOUS

## 2023-05-18 MED ORDER — PROPOFOL 500 MG/50ML IV EMUL
INTRAVENOUS | Status: DC | PRN
  Administered 2023-05-18: 200 ug/kg/min via INTRAVENOUS

## 2023-05-18 MED ORDER — PROPOFOL 1000 MG/100ML IV EMUL
INTRAVENOUS | Status: AC
  Filled 2023-05-18: qty 200

## 2023-05-18 MED ORDER — LACTATED RINGERS IV SOLN: INTRAVENOUS | Status: DC | PRN

## 2023-05-18 MED ORDER — LIDOCAINE 2% (20 MG/ML) 5 ML SYRINGE
INTRAMUSCULAR | Status: AC
  Filled 2023-05-18: qty 10

## 2023-05-18 MED ORDER — LIDOCAINE 2% (20 MG/ML) 5 ML SYRINGE
INTRAMUSCULAR | Status: DC | PRN
Start: 2023-05-18 — End: 2023-05-18
  Administered 2023-05-18: 100 mg via INTRAVENOUS

## 2023-05-18 NOTE — Anesthesia Preprocedure Evaluation (Signed)
 Anesthesia Evaluation    History of Anesthesia Complications (+) PONV  Airway Mallampati: III  TM Distance: >3 FB Neck ROM: Full    Dental no notable dental hx. (+) Dental Advisory Given, Teeth Intact   Pulmonary former smoker Stop Bang 4   Pulmonary exam normal breath sounds clear to auscultation       Cardiovascular hypertension, Normal cardiovascular exam Rhythm:Regular Rate:Normal     Neuro/Psych  Headaches PSYCHIATRIC DISORDERS Anxiety Depression    neuropathy    GI/Hepatic PUD,GERD  ,,  Endo/Other  diabetes, Type 2    Renal/GU Renal disease     Musculoskeletal  (+) Arthritis , Osteoarthritis,    Abdominal   Peds  Hematology   Anesthesia Other Findings   Reproductive/Obstetrics                             Anesthesia Physical Anesthesia Plan  ASA: 3  Anesthesia Plan: General   Post-op Pain Management: Minimal or no pain anticipated   Induction: Intravenous  PONV Risk Score and Plan: Propofol  infusion  Airway Management Planned: Nasal Cannula and Natural Airway  Additional Equipment: None  Intra-op Plan:   Post-operative Plan:   Informed Consent:      Dental advisory given  Plan Discussed with: CRNA  Anesthesia Plan Comments:        Anesthesia Quick Evaluation

## 2023-05-18 NOTE — Anesthesia Postprocedure Evaluation (Signed)
 Anesthesia Post Note  Patient: [name]  Procedure(s) Performed: COLONOSCOPY EGD (ESOPHAGOGASTRODUODENOSCOPY) DILATION, ESOPHAGUS  Patient location during evaluation: PACU Anesthesia Type: General Level of consciousness: awake and alert Pain management: pain level controlled Vital Signs Assessment: post-procedure vital signs reviewed and stable Respiratory status: spontaneous breathing, nonlabored ventilation, respiratory function stable and patient connected to nasal cannula oxygen Cardiovascular status: blood pressure returned to baseline and stable Postop Assessment: no apparent nausea or vomiting Anesthetic complications: no   There were no known notable events for this encounter.   Last Vitals:  Vitals:   05/18/23 1345 05/18/23 1426  BP: (!) 147/79 109/66  Pulse: 87 79  Resp: 10 20  Temp: 36.4 C 36.7 C  SpO2: 99% 98%    Last Pain:  Vitals:   05/18/23 1438  TempSrc:   PainSc: 4                  Charita Lindenberger L Masey Scheiber

## 2023-05-18 NOTE — Discharge Instructions (Signed)
 EGD Discharge instructions Please read the instructions outlined below and refer to this sheet in the next few weeks. These discharge instructions provide you with general information on caring for yourself after you leave the hospital. Your doctor may also give you specific instructions. While your treatment has been planned according to the most current medical practices available, unavoidable complications occasionally occur. If you have any problems or questions after discharge, please call your doctor. ACTIVITY You may resume your regular activity but move at a slower pace for the next 24 hours.  Take frequent rest periods for the next 24 hours.  Walking will help expel (get rid of) the air and reduce the bloated feeling in your abdomen.  No driving for 24 hours (because of the anesthesia (medicine) used during the test).  You may shower.  Do not sign any important legal documents or operate any machinery for 24 hours (because of the anesthesia used during the test).  NUTRITION Drink plenty of fluids.  You may resume your normal diet.  Begin with a light meal and progress to your normal diet.  Avoid alcoholic beverages for 24 hours or as instructed by your caregiver.  MEDICATIONS You may resume your normal medications unless your caregiver tells you otherwise.  WHAT YOU CAN EXPECT TODAY You may experience abdominal discomfort such as a feeling of fullness or "gas" pains.  FOLLOW-UP Your doctor will discuss the results of your test with you.  SEEK IMMEDIATE MEDICAL ATTENTION IF ANY OF THE FOLLOWING OCCUR: Excessive nausea (feeling sick to your stomach) and/or vomiting.  Severe abdominal pain and distention (swelling).  Trouble swallowing.  Temperature over 101 F (37.8 C).  Rectal bleeding or vomiting of blood.      Colonoscopy Discharge Instructions  Read the instructions outlined below and refer to this sheet in the next few weeks. These discharge instructions provide you  with general information on caring for yourself after you leave the hospital. Your doctor may also give you specific instructions. While your treatment has been planned according to the most current medical practices available, unavoidable complications occasionally occur.   ACTIVITY You may resume your regular activity, but move at a slower pace for the next 24 hours.  Take frequent rest periods for the next 24 hours.  Walking will help get rid of the air and reduce the bloated feeling in your belly (abdomen).  No driving for 24 hours (because of the medicine (anesthesia) used during the test).   Do not sign any important legal documents or operate any machinery for 24 hours (because of the anesthesia used during the test).  NUTRITION Drink plenty of fluids.  You may resume your normal diet as instructed by your doctor.  Begin with a light meal and progress to your normal diet. Heavy or fried foods are harder to digest and may make you feel sick to your stomach (nauseated).  Avoid alcoholic beverages for 24 hours or as instructed.  MEDICATIONS You may resume your normal medications unless your doctor tells you otherwise.  WHAT YOU CAN EXPECT TODAY Some feelings of bloating in the abdomen.  Passage of more gas than usual.  Spotting of blood in your stool or on the toilet paper.  IF YOU HAD POLYPS REMOVED DURING THE COLONOSCOPY: No aspirin  products for 7 days or as instructed.  No alcohol for 7 days or as instructed.  Eat a soft diet for the next 24 hours.  FINDING OUT THE RESULTS OF YOUR TEST Not all test results  are available during your visit. If your test results are not back during the visit, make an appointment with your caregiver to find out the results. Do not assume everything is normal if you have not heard from your caregiver or the medical facility. It is important for you to follow up on all of your test results.  SEEK IMMEDIATE MEDICAL ATTENTION IF: You have more than a  spotting of blood in your stool.  Your belly is swollen (abdominal distention).  You are nauseated or vomiting.  You have a temperature over 101.  You have abdominal pain or discomfort that is severe or gets worse throughout the day.   Your EGD revealed mild amount inflammation in your stomach.  I took biopsies of this to rule out infection with a bacteria called H. pylori.  Await pathology results, my office will contact you.  You also had a tightening of your esophagus called a Schatzki's ring.  I stretched this out today.  Hopefully this helps with feeling of food getting stuck.  Continue on Dexilant daily.  Small bowel was normal.  Unfortunately, your colon was not adequately prepped today for colonoscopy.  I did not see any evidence of colon cancer or large polyps, but certainly could have missed smaller polyps due to poor visualization.  Would recommend repeat colonoscopy in 3-6 months with a different colon prep.  Follow-up in GI office in 8 weeks.  I hope you have a great rest of your week!  Rolando Cliche. Mordechai April, D.O. Gastroenterology and Hepatology [facility]

## 2023-05-18 NOTE — Op Note (Signed)
 [facility] Patient Name: [name] Procedure Date: 05/18/2023 1:42 PM MRN: [id number] Date of Birth: Mar 09, 1960 Attending MD: Rolando Cliche. Mordechai April , [place], [id number] CSN: [id number] Age: 63 Admit Type: Outpatient Procedure:                Upper GI endoscopy Indications:              Dysphagia, Heartburn, Nausea Providers:                Rolando Cliche. Mordechai April, DO, Vonna Guardian, Kristine L.                            Roberta Chin, Technician Referring MD:              Medicines:                See the Anesthesia note for documentation of the                            administered medications Complications:            No immediate complications. Estimated Blood Loss:     Estimated blood loss was minimal. Procedure:                Pre-Anesthesia Assessment:                           - The anesthesia plan was to use monitored                            anesthesia care (MAC).                           After obtaining informed consent, the endoscope was                            passed under direct vision. Throughout the                            procedure, the patient's blood pressure, pulse, and                            oxygen saturations were monitored continuously. The                            GIF-H190 ([id number]) scope was introduced through the                            mouth, and advanced to the second part of duodenum.                            The upper GI endoscopy was accomplished without                            difficulty. The patient tolerated the procedure                            well. Scope In:  2:04:03 PM Scope Out: 2:09:39 PM Total Procedure Duration: 0 hours 5 minutes 36 seconds  Findings:      A mild Schatzki ring was found in the distal esophagus. A TTS dilator       was passed through the scope. Dilation with an 18-19-20 mm balloon       dilator was performed to 20 mm. The dilation site was examined and       showed moderate improvement in luminal  narrowing.      Patchy mild inflammation characterized by erythema was found in the       entire examined stomach. Biopsies were taken with a cold forceps for       Helicobacter pylori testing.      The duodenal bulb, first portion of the duodenum and second portion of       the duodenum were normal. Impression:               - Mild Schatzki ring. Dilated.                           - Gastritis. Biopsied.                           - Normal duodenal bulb, first portion of the                            duodenum and second portion of the duodenum. Moderate Sedation:      Per Anesthesia Care Recommendation:           - Patient has a contact number available for                            emergencies. The signs and symptoms of potential                            delayed complications were discussed with the                            patient. Return to normal activities tomorrow.                            Written discharge instructions were provided to the                            patient.                           - Resume previous diet.                           - Continue present medications.                           - Await pathology results.                           - Repeat upper endoscopy PRN for retreatment.                           -  Return to GI clinic in 8 weeks. Procedure Code(s):        --- Professional ---                           [phone], Esophagogastroduodenoscopy, flexible,                            transoral; with transendoscopic balloon dilation of                            esophagus (less than 30 mm diameter)                           43239, 59, Esophagogastroduodenoscopy, flexible,                            transoral; with biopsy, single or multiple Diagnosis Code(s):        --- Professional ---                           K22.2, Esophageal obstruction                           K29.70, Gastritis, unspecified, without bleeding                           R13.10,  Dysphagia, unspecified                           R12, Heartburn                           R11.0, Nausea CPT copyright 2022 [employer] Medical Association. All rights reserved. The codes documented in this report are preliminary and upon coder review may  be revised to meet current compliance requirements. Rolando Cliche. Mordechai April, DO Rolando Cliche. Mordechai April, DO 05/18/2023 2:39:23 PM This report has been signed electronically. Number of Addenda: 0

## 2023-05-18 NOTE — H&P (Signed)
 Primary Care Physician:  Kathyleen Parkins, MD Primary Gastroenterologist:  Dr. Mordechai April  Pre-Procedure History & Physical: HPI:  [name] is a 63 y.o. male is here  for an EGD with possible dilation due to history of dysphagia, GERD, nausea vomiting, and colonoscopy for anemia.  Past Medical History:  Diagnosis Date   Acute biliary pancreatitis 10/23/2013   Acute osteomyelitis of right foot (HCC) 05/22/2021   AKI (acute kidney injury) (HCC) 05/22/2021   Anxiety    Arthritis    Biliary colic 10/23/2013   Chronic osteomyelitis of toe of right foot (HCC)    Depression    Diabetes mellitus    GERD (gastroesophageal reflux disease)    Headache(784.0)    History of kidney stones    Hypertension    Kidney stones    Neuropathy    Osteomyelitis of fourth toe of right foot (HCC) 06/16/2019   PONV (postoperative nausea and vomiting)     Past Surgical History:  Procedure Laterality Date   ABDOMINAL AORTOGRAM W/LOWER EXTREMITY N/A 02/10/2022   Procedure: ABDOMINAL AORTOGRAM W/LOWER EXTREMITY;  Surgeon: Kayla Part, MD;  Location: [facility] INVASIVE CV LAB;  Service: Cardiovascular;  Laterality: N/A;   ACHILLES TENDON SURGERY Right 05/26/2021   Procedure: ACHILLES LENGTHENING/KIDNER;  Surgeon: Floyce Hutching, DPM;  Location: [facility] OR;  Service: Podiatry;  Laterality: Right;   ACHILLES TENDON SURGERY Left 02/11/2022   Procedure: ACHILLES LENGTHENING/KIDNER;  Surgeon: Floyce Hutching, DPM;  Location: [facility] OR;  Service: Podiatry;  Laterality: Left;   AMPUTATION Left 02/11/2022   Procedure: LEFT TRANSMETATARSAL AMPUTATION;  Surgeon: Floyce Hutching, DPM;  Location: [facility] OR;  Service: Podiatry;  Laterality: Left;   AMPUTATION TOE Right 06/21/2019   Procedure: AMPUTATION TOE;  Surgeon: Alanda Allegra, MD;  Location: [facility] ORS;  Service: General;  Laterality: Right;   AMPUTATION TOE Right 08/29/2020   Procedure: RIGHT THIRD AMPUTATION TOE;  Surgeon: Alanda Allegra, MD;  Location: [facility];  Service:  General;  Laterality: Right;  third toe   AMPUTATION TOE Right 02/20/2021   Procedure: AMPUTATION  TRANSMATARSAL  RIGHT; GREAT TOE;  Surgeon: Alanda Allegra, MD;  Location: [facility];  Service: General;  Laterality: Right;   AMPUTATION TOE Left 02/07/2022   Procedure: AMPUTATION TOE;  Surgeon: Floyce Hutching, DPM;  Location: [facility] OR;  Service: Podiatry;  Laterality: Left;  amputation of remainder of Left 4th toe   BACK SURGERY  2022   CARDIAC CATHETERIZATION  01/21/2007   CHOLECYSTECTOMY N/A 10/24/2013   Procedure: LAPAROSCOPIC CHOLECYSTECTOMY;  Surgeon: Beau Bound, MD;  Location: [facility];  Service: General;  Laterality: N/A;   COLONOSCOPY N/A 03/31/2013   Procedure: COLONOSCOPY;  Surgeon: Ruby Corporal, MD;  Location: [facility];  Service: Endoscopy;  Laterality: N/A;  730   IRRIGATION AND DEBRIDEMENT FOOT Right 04/19/2021   Procedure: IRRIGATION AND DEBRIDEMENT RIGHT SECOND TOE;  Surgeon: Alanda Allegra, MD;  Location: [facility];  Service: General;  Laterality: Right;   SECONDARY CLOSURE OF WOUND Right 04/19/2021   Procedure: IRRIGATION AND DEBRIDEMENT SECONDARY CLOSURE OF RIGHT GREAT TOE WOUND;  Surgeon: Alanda Allegra, MD;  Location: [facility];  Service: General;  Laterality: Right;   TRANSMETATARSAL AMPUTATION Right 05/24/2021   Procedure: TRANSMETATARSAL AMPUTATION;  Surgeon: Floyce Hutching, DPM;  Location: [facility] OR;  Service: Podiatry;  Laterality: Right;   TRANSMETATARSAL AMPUTATION Right 05/26/2021   Procedure: TRANSMETATARSAL AMPUTATION Revision;  Surgeon: Floyce Hutching, DPM;  Location: [facility] OR;  Service: Podiatry;  Laterality: Right;  Prior to Admission medications   Medication Sig Start Date End Date Taking? Authorizing Provider  ALPRAZolam  (XANAX ) 0.5 MG tablet Take 0.5 mg by mouth 3 (three) times daily. 02/25/15   [provider]  aspirin  EC 81 MG tablet Take 1 tablet (81 mg total) by mouth daily with breakfast. 06/22/19   Colin Dawley, MD  aspirin -acetaminophen -caffeine   (EXCEDRIN  MIGRAINE) 250-250-65 MG tablet Take 1 tablet by mouth every 6 (six) hours as needed for headache or migraine.    [provider]  atorvastatin  (LIPITOR ) 20 MG tablet Take 20 mg by mouth daily. 03/31/19   [provider]  buPROPion  (WELLBUTRIN  XL) 300 MG 24 hr tablet Take 300 mg by mouth daily. 08/06/20   [provider]  dexlansoprazole (DEXILANT) 60 MG capsule Take 1 capsule by mouth daily. 12/28/21   [provider]  diclofenac Sodium (VOLTAREN) 1 % GEL Apply 2 g topically daily as needed (pain). 03/14/21   [provider]  diltiazem  (TIAZAC ) 420 MG 24 hr capsule Take 420 mg by mouth daily. 02/20/19   [provider]  DULoxetine  (CYMBALTA ) 60 MG capsule Take 60 mg by mouth daily.    [provider]  furosemide  (LASIX ) 20 MG tablet Take 20 mg by mouth daily. 11/25/19   [provider]  hydrALAZINE  (APRESOLINE ) 10 MG tablet Take 10 mg by mouth See admin instructions. Take 1 tablet by mouth daily (orange tablet)    [provider]  insulin  glargine (LANTUS ) 100 UNIT/ML injection Inject 80 Units into the skin daily.    [provider]  metFORMIN  (GLUCOPHAGE ) 500 MG tablet Take 1,000 mg by mouth in the morning and at bedtime. In the morning & 1300    [provider]  Multiple Vitamin (MULTIVITAMIN WITH MINERALS) TABS tablet Take 1 tablet by mouth daily.    [provider]  ondansetron  (ZOFRAN ) 4 MG tablet Take 4 mg by mouth 4 (four) times daily. 03/04/22   [provider]  oxyCODONE  (ROXICODONE ) 15 MG immediate release tablet Take 15 mg by mouth every 4 (four) hours. 02/11/22   [provider]  pregabalin  (LYRICA ) 50 MG capsule Take 1 capsule (50 mg total) by mouth 3 (three) times daily. 12/05/22   Brandt Cake, DPM  sitaGLIPtin (JANUVIA) 100 MG tablet Take 100 mg by mouth daily.    [provider]  XTAMPZA  ER 18 MG C12A Take 1 capsule by mouth 2 (two) times daily.  04/30/23   [provider]    Allergies as of 05/07/2023 - Review Complete 05/07/2023  Allergen Reaction Noted   Latex Rash and Other (See Comments) 10/05/2019    Family History  Problem Relation Age of Onset   Diabetes Mother    Heart failure Mother    Heart disease Mother    Heart failure Father     Social History   Socioeconomic History   Marital status: Divorced    Spouse name: Not on file   Number of children: Not on file   Years of education: Not on file   Highest education level: Not on file  Occupational History   Not on file  Tobacco Use   Smoking status: Former    Current packs/day: 0.00    Average packs/day: 1 pack/day for 7.0 years (7.0 ttl pk-yrs)    Types: Cigarettes    Start date: 01/31/1975    Quit date: 01/30/1982    Years since quitting: 41.3   Smokeless tobacco: Never  Vaping Use  Vaping status: Never Used  Substance and Sexual Activity   Alcohol use: No   Drug use: No   Sexual activity: Not on file  Other Topics Concern   Not on file  Social History Narrative   Not on file   Social Drivers of Health   Financial Resource Strain: Not on file  Food Insecurity: Not on file  Transportation Needs: Not on file  Physical Activity: Not on file  Stress: Not on file  Social Connections: Unknown (06/04/2021)   Received from [facility]   Social Network    Social Network: Not on file  Intimate Partner Violence: Unknown (04/26/2021)   Received from [facility]   HITS    Physically Hurt: Not on file    Insult or Talk Down To: Not on file    Threaten Physical Harm: Not on file    Scream or Curse: Not on file    Review of Systems: General: Negative for fever, chills, fatigue, weakness. Eyes: Negative for vision changes.  ENT: Negative for hoarseness, difficulty swallowing , nasal congestion. CV: Negative for chest pain, angina, palpitations, dyspnea on exertion, peripheral edema.  Respiratory: Negative for  dyspnea at rest, dyspnea on exertion, cough, sputum, wheezing.  GI: See history of present illness. GU:  Negative for dysuria, hematuria, urinary incontinence, urinary frequency, nocturnal urination.  MS: Negative for joint pain, low back pain.  Derm: Negative for rash or itching.  Neuro: Negative for weakness, abnormal sensation, seizure, frequent headaches, memory loss, confusion.  Psych: Negative for anxiety, depression Endo: Negative for unusual weight change.  Heme: Negative for bruising or bleeding. Allergy: Negative for rash or hives.  Physical Exam: Vital signs in last 24 hours:     General:   Alert,  Well-developed, well-nourished, pleasant and cooperative in NAD Head:  Normocephalic and atraumatic. Eyes:  Sclera clear, no icterus.   Conjunctiva pink. Ears:  Normal auditory acuity. Nose:  No deformity, discharge,  or lesions. Msk:  Symmetrical without gross deformities. Normal posture. Extremities:  Without clubbing or edema. Neurologic:  Alert and  oriented x4;  grossly normal neurologically. Skin:  Intact without significant lesions or rashes. Psych:  Alert and cooperative. Normal mood and affect.   Impression/Plan: DVON KUDELKA is here for an EGD with possible dilation due to history of dysphagia, GERD, nausea vomiting, and colonoscopy for anemia.  Risks, benefits, limitations, imponderables and alternatives regarding procedure have been reviewed with the patient. Questions have been answered. All parties agreeable.

## 2023-05-18 NOTE — Op Note (Signed)
 [facility] Patient Name: [name] Procedure Date: 05/18/2023 1:08 PM MRN: [id number] Date of Birth: 07-24-1960 Attending MD: Rolando Cliche. Roel Clarity, [id number] CSN: [id number] Age: 63 Admit Type: Outpatient Procedure:                Colonoscopy Indications:              Iron deficiency anemia Providers:                Rolando Cliche. Mordechai April, DO, Vonna Guardian, Kristine L.                            Roberta Chin, Technician Referring MD:              Medicines:                See the Anesthesia note for documentation of the                            administered medications Complications:            No immediate complications. Estimated Blood Loss:     Estimated blood loss: none. Procedure:                Pre-Anesthesia Assessment:                           - The anesthesia plan was to use monitored                            anesthesia care (MAC).                           After obtaining informed consent, the colonoscope                            was passed under direct vision. Throughout the                            procedure, the patient's blood pressure, pulse, and                            oxygen saturations were monitored continuously. The                            [id number]-HQ190L ([id number]) scope was introduced through                            the anus and advanced to the the cecum, identified                            by appendiceal orifice and ileocecal valve. The                            colonoscopy was somewhat difficult due to a                            redundant colon and significant looping. Successful  completion of the procedure was aided by applying                            abdominal pressure. The patient tolerated the                            procedure well. The quality of the bowel                            preparation was evaluated using the BBPS [facility]                            Bowel Preparation Scale) with scores of:  Right                            Colon = 1 (portion of mucosa seen, but other areas                            not well seen due to staining, residual stool                            and/or opaque liquid), Transverse Colon = 1                            (portion of mucosa seen, but other areas not well                            seen due to staining, residual stool and/or opaque                            liquid) and Left Colon = 2 (minor amount of                            residual staining, small fragments of stool and/or                            opaque liquid, but mucosa seen well). The total                            BBPS score equals 4. The quality of the bowel                            preparation was inadequate. Scope In: 2:13:27 PM Scope Out: 2:21:34 PM Scope Withdrawal Time: 0 hours 2 minutes 53 seconds  Total Procedure Duration: 0 hours 8 minutes 7 seconds  Findings:      Multiple large-mouthed and small-mouthed diverticula were found in the       sigmoid colon and descending colon.      A large amount of semi-solid stool was found in the entire colon,       precluding visualization. Lavage of the area was performed using copious       amounts of sterile water , resulting in incomplete clearance with  continued poor visualization. Impression:               - Preparation of the colon was inadequate.                           - Diverticulosis in the sigmoid colon and in the                            descending colon.                           - Stool in the entire examined colon.                           - No specimens collected. Moderate Sedation:      Per Anesthesia Care Recommendation:           - Patient has a contact number available for                            emergencies. The signs and symptoms of potential                            delayed complications were discussed with the                            patient. Return to normal activities tomorrow.                             Written discharge instructions were provided to the                            patient.                           - Resume previous diet.                           - Continue present medications.                           - Repeat colonoscopy in 3-6 months because the                            bowel preparation was suboptimal.                           - Return to GI clinic in 8 weeks. Procedure Code(s):        --- Professional ---                           [phone], Colonoscopy, flexible; diagnostic, including                            collection of specimen(s) by brushing or washing,  when performed (separate procedure) Diagnosis Code(s):        --- Professional ---                           D50.9, Iron deficiency anemia, unspecified                           K57.30, Diverticulosis of large intestine without                            perforation or abscess without bleeding CPT copyright 2022 [employer]. All rights reserved. The codes documented in this report are preliminary and upon coder review may  be revised to meet current compliance requirements. Rolando Cliche. Mordechai April, DO Rolando Cliche. Mordechai April, DO 05/18/2023 2:41:26 PM This report has been signed electronically. Number of Addenda: 0

## 2023-05-18 NOTE — Transfer of Care (Signed)
 Immediate Anesthesia Transfer of Care Note  Patient: [name]  Procedure(s) Performed: COLONOSCOPY EGD (ESOPHAGOGASTRODUODENOSCOPY) DILATION, ESOPHAGUS  Patient Location: Endoscopy Unit  Anesthesia Type:General  Level of Consciousness: awake, drowsy, and patient cooperative  Airway & Oxygen Therapy: Patient Spontanous Breathing  Post-op Assessment: Report given to RN, Post -op Vital signs reviewed and stable, and Patient moving all extremities X 4  Post vital signs: Reviewed and stable  Last Vitals:  Vitals Value Taken Time  BP 109/66 05/18/23 1426  Temp 36.7 C 05/18/23 1426  Pulse 79 05/18/23 1426  Resp 20 05/18/23 1426  SpO2 98 % 05/18/23 1426    Last Pain:  Vitals:   05/18/23 1426  TempSrc: Oral  PainSc: Asleep      Patients Stated Pain Goal: 7 (05/18/23 1345)  Complications: No notable events documented.

## 2023-05-18 NOTE — Progress Notes (Signed)
Awaiting patient's ride to arrive.

## 2023-05-18 NOTE — Telephone Encounter (Signed)
 Pt called in. Stated he is not able to keep his prep down this morning. Last night reports stool was liquid/clear. He did not want to reschedule. I have sent FYI to Dr. Mordechai April.

## 2023-05-19 ENCOUNTER — Encounter (HOSPITAL_COMMUNITY): Payer: Self-pay | Admitting: Internal Medicine

## 2023-05-19 ENCOUNTER — Telehealth: Payer: Self-pay

## 2023-05-19 NOTE — Telephone Encounter (Signed)
 Returned the pt's phone call and no ans. LMOVM

## 2023-05-20 LAB — SURGICAL PATHOLOGY

## 2023-05-20 NOTE — Telephone Encounter (Signed)
 Spoke with the pt advised his results were not back yet once I receive them I will call him. Pt expressed understanding ad he stated he was advised to schedule a follow up in 6 to 8 weeks and he was transferred to the front.

## 2023-05-22 DIAGNOSIS — E1129 Type 2 diabetes mellitus with other diabetic kidney complication: Secondary | ICD-10-CM | POA: Diagnosis not present

## 2023-05-22 DIAGNOSIS — K922 Gastrointestinal hemorrhage, unspecified: Secondary | ICD-10-CM | POA: Diagnosis not present

## 2023-05-22 DIAGNOSIS — E559 Vitamin D deficiency, unspecified: Secondary | ICD-10-CM | POA: Diagnosis not present

## 2023-05-22 DIAGNOSIS — R5383 Other fatigue: Secondary | ICD-10-CM | POA: Diagnosis not present

## 2023-05-22 DIAGNOSIS — E1140 Type 2 diabetes mellitus with diabetic neuropathy, unspecified: Secondary | ICD-10-CM | POA: Diagnosis not present

## 2023-05-22 DIAGNOSIS — I10 Essential (primary) hypertension: Secondary | ICD-10-CM | POA: Diagnosis not present

## 2023-05-22 DIAGNOSIS — E1142 Type 2 diabetes mellitus with diabetic polyneuropathy: Secondary | ICD-10-CM | POA: Diagnosis not present

## 2023-05-22 DIAGNOSIS — M5416 Radiculopathy, lumbar region: Secondary | ICD-10-CM | POA: Diagnosis not present

## 2023-05-22 DIAGNOSIS — Z0001 Encounter for general adult medical examination with abnormal findings: Secondary | ICD-10-CM | POA: Diagnosis not present

## 2023-05-25 ENCOUNTER — Telehealth: Payer: Self-pay

## 2023-05-25 NOTE — Telephone Encounter (Signed)
 Pt phoned advising that he has been speaking with his PCP (Dr Lewayne Records) and he and the pt is stating that since the pt is still having problems that Dr Lewayne Records requests a Capsule Study to be  done and if you have any questions or need to speak with him regarding this please call [phone]. Please advise

## 2023-05-27 ENCOUNTER — Emergency Department (HOSPITAL_COMMUNITY)

## 2023-05-27 ENCOUNTER — Encounter (HOSPITAL_COMMUNITY): Payer: Self-pay | Admitting: Emergency Medicine

## 2023-05-27 ENCOUNTER — Emergency Department (HOSPITAL_COMMUNITY)
Admission: EM | Admit: 2023-05-27 | Discharge: 2023-05-27 | Disposition: A | Discharge status: Home / Self Care | Attending: Emergency Medicine | Admitting: Emergency Medicine

## 2023-05-27 ENCOUNTER — Other Ambulatory Visit: Payer: Self-pay

## 2023-05-27 DIAGNOSIS — Z91040 Latex allergy status: Secondary | ICD-10-CM | POA: Insufficient documentation

## 2023-05-27 DIAGNOSIS — R112 Nausea with vomiting, unspecified: Secondary | ICD-10-CM | POA: Insufficient documentation

## 2023-05-27 DIAGNOSIS — Z7982 Long term (current) use of aspirin: Secondary | ICD-10-CM | POA: Insufficient documentation

## 2023-05-27 DIAGNOSIS — R1011 Right upper quadrant pain: Secondary | ICD-10-CM | POA: Insufficient documentation

## 2023-05-27 DIAGNOSIS — R1013 Epigastric pain: Secondary | ICD-10-CM

## 2023-05-27 DIAGNOSIS — K5730 Diverticulosis of large intestine without perforation or abscess without bleeding: Secondary | ICD-10-CM | POA: Diagnosis not present

## 2023-05-27 DIAGNOSIS — R109 Unspecified abdominal pain: Secondary | ICD-10-CM | POA: Diagnosis not present

## 2023-05-27 DIAGNOSIS — K8689 Other specified diseases of pancreas: Secondary | ICD-10-CM | POA: Diagnosis not present

## 2023-05-27 LAB — CBC WITH DIFFERENTIAL/PLATELET
Abs Immature Granulocytes: 0.08 10*3/uL — ABNORMAL HIGH (ref 0.00–0.07)
Basophils Absolute: 0.1 10*3/uL (ref 0.0–0.1)
Basophils Relative: 1 %
Eosinophils Absolute: 0.3 10*3/uL (ref 0.0–0.5)
Eosinophils Relative: 3 %
HCT: 32.6 % — ABNORMAL LOW (ref 39.0–52.0)
Hemoglobin: 10.1 g/dL — ABNORMAL LOW (ref 13.0–17.0)
Immature Granulocytes: 1 %
Lymphocytes Relative: 36 %
Lymphs Abs: 3.5 10*3/uL (ref 0.7–4.0)
MCH: 24.6 pg — ABNORMAL LOW (ref 26.0–34.0)
MCHC: 31 g/dL (ref 30.0–36.0)
MCV: 79.3 fL — ABNORMAL LOW (ref 80.0–100.0)
Monocytes Absolute: 0.7 10*3/uL (ref 0.1–1.0)
Monocytes Relative: 7 %
Neutro Abs: 5 10*3/uL (ref 1.7–7.7)
Neutrophils Relative %: 52 %
Platelets: 286 10*3/uL (ref 150–400)
RBC: 4.11 MIL/uL — ABNORMAL LOW (ref 4.22–5.81)
RDW: 16.3 % — ABNORMAL HIGH (ref 11.5–15.5)
WBC: 9.6 10*3/uL (ref 4.0–10.5)
nRBC: 0 % (ref 0.0–0.2)

## 2023-05-27 LAB — URINALYSIS, ROUTINE W REFLEX MICROSCOPIC
Bacteria, UA: NONE SEEN
Bilirubin Urine: NEGATIVE
Glucose, UA: NEGATIVE mg/dL
Hgb urine dipstick: NEGATIVE
Ketones, ur: NEGATIVE mg/dL
Leukocytes,Ua: NEGATIVE
Nitrite: NEGATIVE
Protein, ur: 100 mg/dL — AB
Specific Gravity, Urine: 1.02 (ref 1.005–1.030)
pH: 5 (ref 5.0–8.0)

## 2023-05-27 LAB — LIPASE, BLOOD: Lipase: 33 U/L (ref 11–51)

## 2023-05-27 LAB — COMPREHENSIVE METABOLIC PANEL WITH GFR
ALT: 21 U/L (ref 0–44)
AST: 20 U/L (ref 15–41)
Albumin: 3.9 g/dL (ref 3.5–5.0)
Alkaline Phosphatase: 87 U/L (ref 38–126)
Anion gap: 11 (ref 5–15)
BUN: 17 mg/dL (ref 8–23)
CO2: 22 mmol/L (ref 22–32)
Calcium: 8.9 mg/dL (ref 8.9–10.3)
Chloride: 105 mmol/L (ref 98–111)
Creatinine, Ser: 0.99 mg/dL (ref 0.61–1.24)
GFR, Estimated: >60 mL/min (ref >60)
Glucose, Bld: 99 mg/dL (ref 70–99)
Potassium: 3.4 mmol/L — ABNORMAL LOW (ref 3.5–5.1)
Sodium: 138 mmol/L (ref 135–145)
Total Bilirubin: 0.4 mg/dL (ref 0.0–1.2)
Total Protein: 6.7 g/dL (ref 6.5–8.1)

## 2023-05-27 LAB — TROPONIN I (HIGH SENSITIVITY): Troponin I (High Sensitivity): 7 ng/L (ref <18)

## 2023-05-27 MED ORDER — IOHEXOL 300 MG/ML  SOLN
100.0000 mL | Freq: Once | INTRAMUSCULAR | Status: AC | PRN
  Administered 2023-05-27: 100 mL via INTRAVENOUS

## 2023-05-27 MED ORDER — METOCLOPRAMIDE HCL 10 MG PO TABS: 10.0000 mg | ORAL_TABLET | Freq: Four times a day (QID) | ORAL | 0 refills | Status: DC

## 2023-05-27 MED ORDER — MORPHINE SULFATE (PF) 4 MG/ML IV SOLN
4.0000 mg | Freq: Once | INTRAVENOUS | Status: AC
  Administered 2023-05-27: 4 mg via INTRAVENOUS
  Filled 2023-05-27: qty 1

## 2023-05-27 MED ORDER — KETOROLAC TROMETHAMINE 15 MG/ML IJ SOLN
15.0000 mg | Freq: Once | INTRAMUSCULAR | Status: AC
Start: 2023-05-27 — End: 2023-05-27
  Administered 2023-05-27: 15 mg via INTRAVENOUS
  Filled 2023-05-27: qty 1

## 2023-05-27 MED ORDER — ONDANSETRON HCL 4 MG/2ML IJ SOLN
4.0000 mg | Freq: Once | INTRAMUSCULAR | Status: AC
  Administered 2023-05-27: 4 mg via INTRAVENOUS
  Filled 2023-05-27: qty 2

## 2023-05-27 NOTE — ED Provider Notes (Signed)
 [facility] AT [facility] Provider Note   CSN: [id number] Arrival date & time: 05/27/23  4098     History  Chief Complaint  Patient presents with   Abdominal Pain    [name] is a 63 y.o. male.  Patient is a 63 year old male who presents emergency department the chief complaint of right upper quadrant abdominal pain, nausea, vomiting which has been ongoing for approximate the past 2 months.  Patient has been seen by his primary care doctor as well as by gastroenterology.  He recently had an upper endoscopy performed.  Patient notes he has been compliant with his home medications.  He notes that last night he noticed a knot which formed on the right upper quadrant of his abdomen which has been tender.  Patient denies any associated diarrhea, constipation, dysuria, hematuria.  He denies any recent falls or blunt abdominal wall trauma.   Abdominal Pain      Home Medications Prior to Admission medications   Medication Sig Start Date End Date Taking? Authorizing Provider  ALPRAZolam  (XANAX ) 0.5 MG tablet Take 0.5 mg by mouth 3 (three) times daily. 02/25/15   [provider]  aspirin  EC 81 MG tablet Take 1 tablet (81 mg total) by mouth daily with breakfast. 06/22/19   Colin Dawley, MD  aspirin -acetaminophen -caffeine  (EXCEDRIN  MIGRAINE) 250-250-65 MG tablet Take 1 tablet by mouth every 6 (six) hours as needed for headache or migraine.    [provider]  atorvastatin  (LIPITOR ) 20 MG tablet Take 20 mg by mouth daily. 03/31/19   [provider]  buPROPion  (WELLBUTRIN  XL) 300 MG 24 hr tablet Take 300 mg by mouth daily. 08/06/20   [provider]  dexlansoprazole (DEXILANT) 60 MG capsule Take 1 capsule by mouth daily. 12/28/21   [provider]  diclofenac Sodium (VOLTAREN) 1 % GEL Apply 2 g topically daily as needed (pain). 03/14/21   [provider]  diltiazem  (TIAZAC ) 420 MG 24 hr capsule Take 420 mg  by mouth daily. 02/20/19   [provider]  DULoxetine  (CYMBALTA ) 60 MG capsule Take 60 mg by mouth daily.    [provider]  furosemide  (LASIX ) 20 MG tablet Take 20 mg by mouth daily. 11/25/19   [provider]  hydrALAZINE  (APRESOLINE ) 10 MG tablet Take 10 mg by mouth See admin instructions. Take 1 tablet by mouth daily (orange tablet)    [provider]  insulin  glargine (LANTUS ) 100 UNIT/ML injection Inject 80 Units into the skin daily.    [provider]  linaclotide  (LINZESS ) 290 MCG CAPS capsule Take 1 capsule (290 mcg total) by mouth daily before breakfast. 05/18/23 05/17/24  Carver, Charles K, DO  metFORMIN  (GLUCOPHAGE ) 500 MG tablet Take 1,000 mg by mouth in the morning and at bedtime. In the morning & 1300    [provider]  Multiple Vitamin (MULTIVITAMIN WITH MINERALS) TABS tablet Take 1 tablet by mouth daily.    [provider]  ondansetron  (ZOFRAN ) 4 MG tablet Take 4 mg by mouth 4 (four) times daily. 03/04/22   [provider]  oxyCODONE  (ROXICODONE ) 15 MG immediate release tablet Take 15 mg by mouth every 4 (four) hours. 02/11/22   [provider]  pregabalin  (LYRICA ) 50 MG capsule Take 1 capsule (50 mg total) by mouth 3 (three) times daily. 12/05/22   Brandt Cake, DPM  sitaGLIPtin (JANUVIA) 100 MG tablet Take 100 mg by mouth daily.    [provider]  XTAMPZA   ER 18 MG C12A Take 1 capsule by mouth 2 (two) times daily. 04/30/23   [provider]      Allergies    Latex    Review of Systems   Review of Systems  Gastrointestinal:  Positive for abdominal pain.  All other systems reviewed and are negative.   Physical Exam Updated Vital Signs BP 135/68 (BP Location: Left Arm)   Pulse 97   Temp 100.1 F (37.8 C) (Oral)   Resp (!) 21   SpO2 99%  Physical Exam Vitals and nursing note reviewed.  Constitutional:      Appearance: Normal appearance.  HENT:     Head: Normocephalic  and atraumatic.     Nose: Nose normal.     Mouth/Throat:     Mouth: Mucous membranes are moist.  Eyes:     Extraocular Movements: Extraocular movements intact.     Conjunctiva/sclera: Conjunctivae normal.     Pupils: Pupils are equal, round, and reactive to light.  Cardiovascular:     Rate and Rhythm: Normal rate and regular rhythm.     Pulses: Normal pulses.     Heart sounds: Normal heart sounds.  Pulmonary:     Effort: Pulmonary effort is normal. No respiratory distress.     Breath sounds: Normal breath sounds. No stridor. No wheezing, rhonchi or rales.  Abdominal:     General: Abdomen is flat. Bowel sounds are normal. There is no distension.     Palpations: Abdomen is soft. There is no mass.     Tenderness: There is abdominal tenderness in the right upper quadrant. There is no guarding.     Hernia: No hernia is present.  Musculoskeletal:        General: Normal range of motion.     Cervical back: Normal range of motion and neck supple.  Skin:    General: Skin is warm and dry.     Findings: No rash.  Neurological:     General: No focal deficit present.     Mental Status: He is alert and oriented to person, place, and time. Mental status is at baseline.  Psychiatric:        Mood and Affect: Mood normal.        Behavior: Behavior normal.        Thought Content: Thought content normal.        Judgment: Judgment normal.     ED Results / Procedures / Treatments   Labs (all labs ordered are listed, but only abnormal results are displayed) Labs Reviewed  COMPREHENSIVE METABOLIC PANEL WITH GFR  CBC WITH DIFFERENTIAL/PLATELET  LIPASE, BLOOD  URINALYSIS, ROUTINE W REFLEX MICROSCOPIC  TROPONIN I (HIGH SENSITIVITY)    EKG None  Radiology No results found.  Procedures Procedures    Medications Ordered in ED Medications  morphine  (PF) 4 MG/ML injection 4 mg (has no administration in time range)  ketorolac  (TORADOL ) 15 MG/ML injection 15 mg (has no administration in  time range)  ondansetron  (ZOFRAN ) injection 4 mg (has no administration in time range)    ED Course/ Medical Decision Making/ A&P                                 Medical Decision Making Amount and/or Complexity of Data Reviewed Labs: ordered. Radiology: ordered.  Risk Prescription drug management.   This patient presents to the ED for concern of abdominal pain, nausea and vomiting differential diagnosis includes  acute appendicitis, small bowel obstruction, diverticulitis, testicular torsion, pyelonephritis, kidney stone, mesenteric ischemia, pancreatitis, hernia   Additional history obtained:  Additional history obtained from medical records External records from outside source obtained and reviewed including medical records   Lab Tests:  I Ordered, and personally interpreted labs.  The pertinent results include:  no leukocytosis, anemia, normal electrolytes, normal kidney function liver function, negative troponin, negative lipase, unremarkable urinalysis   Imaging Studies ordered:  I ordered imaging studies including CT scan of the abdomen and pelvis I independently visualized and interpreted imaging which showed no acute surgical process I agree with the radiologist interpretation   Medicines ordered and prescription drug management:  I ordered medication including morphine , Toradol , Zofran  for nausea vomiting, abdominal pain Reevaluation of the patient after these medicines showed that the patient improved I have reviewed the patients home medicines and have made adjustments as needed   Problem List / ED Course:  Patient is doing better at this time and is stable for discharge home.  Discussed with patient that all workup in the emergency department has been unremarkable.  Discussed with patient about the continues of close follow-up with his primary care doctor and his gastroenterologist on an outpatient basis.  Will provide additional antiemetic for use at home.   Patient has no indication for acute appendicitis, small bowel obstruction, diverticulitis, testicular torsion, pyelonephritis, kidney stone, pancreatitis, mesenteric ischemia, hernia, cellulitis, abscess formation, choledocholithiasis, acute cholangitis.  Strict turn precautions were provided for any new or worsening symptoms.  Patient voiced understanding and had no additional questions.  Patient was fully evaluated by attending physician who is in agreement to plan at this time.   Social Determinants of Health:  None           Final Clinical Impression(s) / ED Diagnoses Final diagnoses:  None    Rx / DC Orders ED Discharge Orders     None         Emmalene Hare 05/27/23 1153    Ninetta Basket, MD 06/02/23 1135

## 2023-05-27 NOTE — ED Notes (Signed)
 Pt returned from ct

## 2023-05-27 NOTE — ED Notes (Signed)
 Pt is bilateral amputation of toes. 3 plus pitting edema around ankles which pt states has been intermittent since the surgery and no worse than usual. Nad.

## 2023-05-27 NOTE — Discharge Instructions (Signed)
 Follow-up closely with your primary care doctor and your gastroenterologist on an outpatient basis.  Return to the emergency department immediately for any new or worsening symptoms.

## 2023-05-27 NOTE — ED Triage Notes (Signed)
 Pt c/o abd pain/vomiting x 2 months intermittent. Has seen GI. Pt came today due to "a knot" coming up to his RUQ area. No knot noted with palpation. Nad.

## 2023-06-09 DIAGNOSIS — M5125 Other intervertebral disc displacement, thoracolumbar region: Secondary | ICD-10-CM | POA: Diagnosis not present

## 2023-06-09 DIAGNOSIS — M48061 Spinal stenosis, lumbar region without neurogenic claudication: Secondary | ICD-10-CM | POA: Diagnosis not present

## 2023-06-09 DIAGNOSIS — M4316 Spondylolisthesis, lumbar region: Secondary | ICD-10-CM | POA: Diagnosis not present

## 2023-06-09 DIAGNOSIS — M47816 Spondylosis without myelopathy or radiculopathy, lumbar region: Secondary | ICD-10-CM | POA: Diagnosis not present

## 2023-06-09 DIAGNOSIS — M4807 Spinal stenosis, lumbosacral region: Secondary | ICD-10-CM | POA: Diagnosis not present

## 2023-06-16 DIAGNOSIS — M5416 Radiculopathy, lumbar region: Secondary | ICD-10-CM | POA: Diagnosis not present

## 2023-06-16 DIAGNOSIS — M25511 Pain in right shoulder: Secondary | ICD-10-CM | POA: Diagnosis not present

## 2023-06-17 ENCOUNTER — Other Ambulatory Visit: Payer: Self-pay | Admitting: Podiatry

## 2023-06-22 DIAGNOSIS — M67911 Unspecified disorder of synovium and tendon, right shoulder: Secondary | ICD-10-CM | POA: Diagnosis not present

## 2023-06-22 DIAGNOSIS — E1140 Type 2 diabetes mellitus with diabetic neuropathy, unspecified: Secondary | ICD-10-CM | POA: Diagnosis not present

## 2023-06-22 DIAGNOSIS — E1142 Type 2 diabetes mellitus with diabetic polyneuropathy: Secondary | ICD-10-CM | POA: Diagnosis not present

## 2023-06-22 DIAGNOSIS — T148XXA Other injury of unspecified body region, initial encounter: Secondary | ICD-10-CM | POA: Diagnosis not present

## 2023-06-22 DIAGNOSIS — Z89432 Acquired absence of left foot: Secondary | ICD-10-CM | POA: Diagnosis not present

## 2023-06-22 DIAGNOSIS — E1129 Type 2 diabetes mellitus with other diabetic kidney complication: Secondary | ICD-10-CM | POA: Diagnosis not present

## 2023-06-22 DIAGNOSIS — S300XXA Contusion of lower back and pelvis, initial encounter: Secondary | ICD-10-CM | POA: Diagnosis not present

## 2023-06-22 DIAGNOSIS — M5416 Radiculopathy, lumbar region: Secondary | ICD-10-CM | POA: Diagnosis not present

## 2023-06-30 ENCOUNTER — Ambulatory Visit: Admitting: Podiatry

## 2023-07-02 DIAGNOSIS — M5416 Radiculopathy, lumbar region: Secondary | ICD-10-CM | POA: Diagnosis not present

## 2023-07-14 ENCOUNTER — Ambulatory Visit: Admitting: Gastroenterology

## 2023-07-14 NOTE — Progress Notes (Deleted)
 GI Office Note    Referring Provider: Bertell Satterfield, MD Primary Care Physician:  Bertell Satterfield, MD Primary Gastroenterologist: *** Date:  07/14/2023  ID:  [name], [name] 20-Jan-1961, MRN [id number]   Chief Complaint   No chief complaint on file.   History of Present Illness  [name] is a 63 y.o. male with a history of *** presenting today with complaint of   EGD August 2012 by Dr. Golda: - Focal acute esophagitis, questionably pill induced - Erosive antral gastritis - Pathology confirmed erosive esophagitis - Advised Carafate  4 times daily, daily PPI.  Colonoscopy March 2015 by Dr. Golda: - 3 tubular adenomas removed from cecum and ascending colon - Mild sigmoid diverticulosis - Small external hemorrhoids -Pathology negative for H. pylori - Advised high-fiber diet. - Advised repeat colonoscopy in 5 years.  Last office visit 05/07/2023 with Dr. Cindie.  Referred by Dr. Bertell.  Reported history of chronic GERD for many years and was taking Dexilant 60 mg daily with relatively good control of symptoms.  Noted progressively worsening esophageal dysphagia and also some issues with nausea and vomiting for which she was taking Pepto-Bismol.  Noted some recent worsening constipation, going multiple days of a bowel movement and stools have been [place] 1 with some occasional straining.  Admitted to chronic opioids.  Advised EGD with possible dilation given GERD, nausea, vomiting, and dysphagia.  Also advised colonoscopy given history of colon polyps.  Advised to continue Dexilant.  Provided samples of Linzess  290 mcg.  Patient called in reporting he could not keep down the prep, had multiple episodes of vomiting.  EGD 05/18/2023: - Mild Schatzki's ring s/p dilation up to 20 mm (moderate improvement of luminal narrowing) - Gastritis s/p biopsy - Normal 1st and 2nd portion of duodenum. - Pathology: Reactive epithelial changes.  Negative H. pylori. - Repeat EGD as  needed.  Colonoscopy 05/18/2023: - Inadequate colon prep (large amount of semiformed stool in the entire colon, lavaged with incomplete clearance) - Left-sided diverticulosis - Advise repeat colonoscopy in 3-6 months given subpar prep  Telephone note 05/25/2023 with patient stated that he talk with Dr. Bertell and in their conversation it was reported that given he was still having problems Dr. Bertell wanted him to have a capsule study done.  Today:    Wt Readings from Last 3 Encounters:  05/07/23 188 lb (85.3 kg)  01/01/23 190 lb (86.2 kg)  03/20/22 186 lb (84.4 kg)    Current Outpatient Medications  Medication Sig Dispense Refill   ALPRAZolam  (XANAX ) 0.5 MG tablet Take 0.5 mg by mouth 3 (three) times daily.  2   aspirin  EC 81 MG tablet Take 1 tablet (81 mg total) by mouth daily with breakfast. 120 tablet 3   aspirin -acetaminophen -caffeine  (EXCEDRIN  MIGRAINE) 250-250-65 MG tablet Take 1 tablet by mouth every 6 (six) hours as needed for headache or migraine.     atorvastatin  (LIPITOR ) 20 MG tablet Take 20 mg by mouth daily.     buPROPion  (WELLBUTRIN  XL) 300 MG 24 hr tablet Take 300 mg by mouth daily.     dexlansoprazole (DEXILANT) 60 MG capsule Take 1 capsule by mouth daily.     diclofenac Sodium (VOLTAREN) 1 % GEL Apply 2 g topically daily as needed (pain).     diltiazem  (TIAZAC ) 420 MG 24 hr capsule Take 420 mg by mouth daily.     DULoxetine  (CYMBALTA ) 60 MG capsule Take 60 mg by mouth daily.     furosemide  (LASIX ) 20 MG  tablet Take 20 mg by mouth daily.     hydrALAZINE  (APRESOLINE ) 10 MG tablet Take 10 mg by mouth See admin instructions. Take 1 tablet by mouth daily (orange tablet)     insulin  glargine (LANTUS ) 100 UNIT/ML injection Inject 80 Units into the skin daily.     linaclotide  (LINZESS ) 290 MCG CAPS capsule Take 1 capsule (290 mcg total) by mouth daily before breakfast. 90 capsule 3   metFORMIN  (GLUCOPHAGE ) 500 MG tablet Take 1,000 mg by mouth in the morning and at bedtime. In  the morning & 1300     metoCLOPramide  (REGLAN ) 10 MG tablet Take 1 tablet (10 mg total) by mouth every 6 (six) hours. 30 tablet 0   Multiple Vitamin (MULTIVITAMIN WITH MINERALS) TABS tablet Take 1 tablet by mouth daily.     ondansetron  (ZOFRAN ) 4 MG tablet Take 4 mg by mouth 4 (four) times daily.     oxyCODONE  (ROXICODONE ) 15 MG immediate release tablet Take 15 mg by mouth every 4 (four) hours.     pregabalin  (LYRICA ) 50 MG capsule TAKE 1 CAPSULE(50 MG) BY MOUTH THREE TIMES DAILY 60 capsule 0   sitaGLIPtin (JANUVIA) 100 MG tablet Take 100 mg by mouth daily.     XTAMPZA  ER 18 MG C12A Take 1 capsule by mouth 2 (two) times daily.     No current facility-administered medications for this visit.    Past Medical History:  Diagnosis Date   Acute biliary pancreatitis 10/23/2013   Acute osteomyelitis of right foot (HCC) 05/22/2021   AKI (acute kidney injury) (HCC) 05/22/2021   Anxiety    Arthritis    Biliary colic 10/23/2013   Chronic osteomyelitis of toe of right foot (HCC)    Depression    Diabetes mellitus    GERD (gastroesophageal reflux disease)    Headache(784.0)    History of kidney stones    Hypertension    Kidney stones    Neuropathy    Osteomyelitis of fourth toe of right foot (HCC) 06/16/2019   PONV (postoperative nausea and vomiting)     Past Surgical History:  Procedure Laterality Date   ABDOMINAL AORTOGRAM W/LOWER EXTREMITY N/A 02/10/2022   Procedure: ABDOMINAL AORTOGRAM W/LOWER EXTREMITY;  Surgeon: Lanis Fonda BRAVO, MD;  Location: [facility] INVASIVE CV LAB;  Service: Cardiovascular;  Laterality: N/A;   ACHILLES TENDON SURGERY Right 05/26/2021   Procedure: ACHILLES LENGTHENING/KIDNER;  Surgeon: Silva Juliene SAUNDERS, DPM;  Location: [facility] OR;  Service: Podiatry;  Laterality: Right;   ACHILLES TENDON SURGERY Left 02/11/2022   Procedure: ACHILLES LENGTHENING/KIDNER;  Surgeon: Silva Juliene SAUNDERS, DPM;  Location: [facility] OR;  Service: Podiatry;  Laterality: Left;   AMPUTATION Left 02/11/2022    Procedure: LEFT TRANSMETATARSAL AMPUTATION;  Surgeon: Silva Juliene SAUNDERS, DPM;  Location: [facility] OR;  Service: Podiatry;  Laterality: Left;   AMPUTATION TOE Right 06/21/2019   Procedure: AMPUTATION TOE;  Surgeon: Mavis Anes, MD;  Location: [facility];  Service: General;  Laterality: Right;   AMPUTATION TOE Right 08/29/2020   Procedure: RIGHT THIRD AMPUTATION TOE;  Surgeon: Mavis Anes, MD;  Location: [facility];  Service: General;  Laterality: Right;  third toe   AMPUTATION TOE Right 02/20/2021   Procedure: AMPUTATION  TRANSMATARSAL  RIGHT; GREAT TOE;  Surgeon: Mavis Anes, MD;  Location: [facility];  Service: General;  Laterality: Right;   AMPUTATION TOE Left 02/07/2022   Procedure: AMPUTATION TOE;  Surgeon: Silva Juliene SAUNDERS, DPM;  Location: [facility] OR;  Service: Podiatry;  Laterality: Left;  amputation of remainder of Left  4th toe   BACK SURGERY  2022   CARDIAC CATHETERIZATION  01/21/2007   CHOLECYSTECTOMY N/A 10/24/2013   Procedure: LAPAROSCOPIC CHOLECYSTECTOMY;  Surgeon: Oneil DELENA Budge, MD;  Location: [facility];  Service: General;  Laterality: N/A;   COLONOSCOPY N/A 03/31/2013   Procedure: COLONOSCOPY;  Surgeon: Claudis RAYMOND Rivet, MD;  Location: [facility];  Service: Endoscopy;  Laterality: N/A;  730   COLONOSCOPY N/A 05/18/2023   Procedure: COLONOSCOPY;  Surgeon: Cindie Carlin POUR, DO;  Location: [facility];  Service: Endoscopy;  Laterality: N/A;  215pm, asa 2   ESOPHAGEAL DILATION N/A 05/18/2023   Procedure: DILATION, ESOPHAGUS;  Surgeon: Cindie Carlin POUR, DO;  Location: [facility];  Service: Endoscopy;  Laterality: N/A;   ESOPHAGOGASTRODUODENOSCOPY N/A 05/18/2023   Procedure: EGD (ESOPHAGOGASTRODUODENOSCOPY);  Surgeon: Cindie Carlin POUR, DO;  Location: [facility];  Service: Endoscopy;  Laterality: N/A;   IRRIGATION AND DEBRIDEMENT FOOT Right 04/19/2021   Procedure: IRRIGATION AND DEBRIDEMENT RIGHT SECOND TOE;  Surgeon: Budge Oneil, MD;  Location: [facility];  Service: General;  Laterality: Right;    SECONDARY CLOSURE OF WOUND Right 04/19/2021   Procedure: IRRIGATION AND DEBRIDEMENT SECONDARY CLOSURE OF RIGHT GREAT TOE WOUND;  Surgeon: Budge Oneil, MD;  Location: [facility];  Service: General;  Laterality: Right;   TRANSMETATARSAL AMPUTATION Right 05/24/2021   Procedure: TRANSMETATARSAL AMPUTATION;  Surgeon: Silva Juliene SAUNDERS, DPM;  Location: [facility];  Service: Podiatry;  Laterality: Right;   TRANSMETATARSAL AMPUTATION Right 05/26/2021   Procedure: TRANSMETATARSAL AMPUTATION Revision;  Surgeon: Silva Juliene SAUNDERS, DPM;  [facility] OR;  Service: Podiatry;  Laterality: Right;    Family History  Problem Relation Age of Onset   Diabetes Mother    Heart failure Mother    Heart disease Mother    Heart failure Father     Allergies as of 07/14/2023 - Review Complete 05/27/2023  Allergen Reaction Noted   Latex Rash and Other (See Comments) 10/05/2019    Social History   Socioeconomic History   Marital status: Divorced    Spouse name: Not on file   Number of children: Not on file   Years of education: Not on file   Highest education level: Not on file  Occupational History   Not on file  Tobacco Use   Smoking status: Former    Current packs/day: 0.00    Average packs/day: 1 pack/day for 7.0 years (7.0 ttl pk-yrs)    Types: Cigarettes    Start date: 01/31/1975    Quit date: 01/30/1982    Years since quitting: 41.4   Smokeless tobacco: Never  Vaping Use   Vaping status: Never Used  Substance and Sexual Activity   Alcohol use: No   Drug use: No   Sexual activity: Not on file  Other Topics Concern   Not on file  Social History Narrative   Not on file   Social Drivers of Health   Financial Resource Strain: Not on file  Food Insecurity: Not on file  Transportation Needs: Not on file  Physical Activity: Not on file  Stress: Not on file  Social Connections: Unknown (06/04/2021)   Received from [facility]   Social Network    Social Network: Not on file     Review of Systems    Gen: Denies fever, chills, anorexia. Denies fatigue, weakness, weight loss.  CV: Denies chest pain, palpitations, syncope, peripheral edema, and claudication. Resp: Denies dyspnea at rest, cough, wheezing, coughing up blood, and pleurisy. GI: See HPI  Derm: Denies rash, itching, dry skin Psych: Denies depression, anxiety, memory loss, confusion. No homicidal or suicidal ideation.  Heme: Denies bruising, bleeding, and enlarged lymph nodes.  Physical Exam   There were no vitals taken for this visit.  General:   Alert and oriented. No distress noted. Pleasant and cooperative.  Head:  Normocephalic and atraumatic. Eyes:  Conjuctiva clear without scleral icterus. Mouth:  Oral mucosa pink and moist. Good dentition. No lesions. Lungs:  Clear to auscultation bilaterally. No wheezes, rales, or rhonchi. No distress.  Heart:  S1, S2 present without murmurs appreciated.  Abdomen:  +BS, soft, non-tender and non-distended. No rebound or guarding. No HSM or masses noted. Rectal: *** Msk:  Symmetrical without gross deformities. Normal posture. Extremities:  Without edema. Neurologic:  Alert and  oriented x4 Psych:  Alert and cooperative. Normal mood and affect.  Assessment  [name] is a 63 y.o. male with a history of *** presenting today with    PLAN   ***     Charmaine Melia, MSN, FNP-BC, AGACNP-BC [facility]

## 2023-07-15 ENCOUNTER — Encounter: Payer: Self-pay | Admitting: Gastroenterology

## 2023-07-21 ENCOUNTER — Ambulatory Visit: Admitting: Podiatry

## 2023-07-22 DIAGNOSIS — M67911 Unspecified disorder of synovium and tendon, right shoulder: Secondary | ICD-10-CM | POA: Diagnosis not present

## 2023-07-22 DIAGNOSIS — I10 Essential (primary) hypertension: Secondary | ICD-10-CM | POA: Diagnosis not present

## 2023-07-22 DIAGNOSIS — E1142 Type 2 diabetes mellitus with diabetic polyneuropathy: Secondary | ICD-10-CM | POA: Diagnosis not present

## 2023-07-22 DIAGNOSIS — E1129 Type 2 diabetes mellitus with other diabetic kidney complication: Secondary | ICD-10-CM | POA: Diagnosis not present

## 2023-07-22 DIAGNOSIS — M5416 Radiculopathy, lumbar region: Secondary | ICD-10-CM | POA: Diagnosis not present

## 2023-07-22 DIAGNOSIS — M4306 Spondylolysis, lumbar region: Secondary | ICD-10-CM | POA: Diagnosis not present

## 2023-07-22 DIAGNOSIS — G894 Chronic pain syndrome: Secondary | ICD-10-CM | POA: Diagnosis not present

## 2023-07-22 DIAGNOSIS — Z89432 Acquired absence of left foot: Secondary | ICD-10-CM | POA: Diagnosis not present

## 2023-07-22 DIAGNOSIS — E1140 Type 2 diabetes mellitus with diabetic neuropathy, unspecified: Secondary | ICD-10-CM | POA: Diagnosis not present

## 2023-08-04 NOTE — Progress Notes (Unsigned)
 GI Office Note    Referring Provider: Bertell Satterfield, MD Primary Care Physician:  Bertell Satterfield, MD Primary Gastroenterologist: Carlin POUR. Cindie, DO  Date:  08/06/2023  ID:  [name], DOB March 31, 1960, MRN [id number]  Chief Complaint   Chief Complaint  Patient presents with   Follow-up    Follow up. Having pains in stomach nausea and vomiting    History of Present Illness  [name] is a 63 y.o. male with a history of GERD, diabetes, depression/anxiety, biliary pancreatitis s/p cholecystectomy 2015, HTN, and osteomyelitis presenting today with complaint of nausea, vomiting, epigastric pain.   EGD August 2012 by Dr. Golda: - Focal acute esophagitis, questionably pill induced - Erosive antral gastritis - Pathology confirmed erosive esophagitis - Advised Carafate  4 times daily, daily PPI.   Colonoscopy March 2015 by Dr. Golda: - 3 tubular adenomas removed from cecum and ascending colon - Mild sigmoid diverticulosis - Small external hemorrhoids -Pathology negative for H. pylori - Advised high-fiber diet. - Advised repeat colonoscopy in 5 years.   Last office visit 05/07/2023 with Dr. Cindie.  Referred by Dr. Bertell.  Reported history of chronic GERD for many years and was taking Dexilant 60 mg daily with relatively good control of symptoms.  Noted progressively worsening esophageal dysphagia and also some issues with nausea and vomiting for which she was taking Pepto-Bismol.  Noted some recent worsening constipation, going multiple days of a bowel movement and stools have been [place] 1 with some occasional straining.  Admitted to chronic opioids.  Advised EGD with possible dilation given GERD, nausea, vomiting, and dysphagia.  Also advised colonoscopy given history of colon polyps.  Advised to continue Dexilant.  Provided samples of Linzess  290 mcg.   Patient called in reporting he could not keep down the prep, had multiple episodes of vomiting.   EGD  05/18/2023: - Mild Schatzki's ring s/p dilation up to 20 mm (moderate improvement of luminal narrowing) - Gastritis s/p biopsy - Normal 1st and 2nd portion of duodenum. - Pathology: Reactive epithelial changes.  Negative H. pylori. - Repeat EGD as needed.   Colonoscopy 05/18/2023: - Inadequate colon prep (large amount of semiformed stool in the entire colon, lavaged with incomplete clearance) - Left-sided diverticulosis - Advised repeat colonoscopy in 3-6 months given subpar prep   Telephone note 05/25/2023 with patient stated that he talked with Dr. Bertell and in their conversation it was reported that given he was still having problems Dr. Bertell wanted him to have a capsule study done.  Today:  The past 2 weeks he has had morning nausea and then he will take his nausea pills and its not helping much in the Ruq and epigastrium. He has had a couple episodes of vomiting. He has not much of an appetite and the smell of food turns him off. Has happened 3-4 days out of the week. No recent sick contacts. Does take opioids chronically - 20 mg oxycodone  every 6-12 hours. (Has been on this dose for about 4-5 months). Sometimes the zxofran doesn't help and he has been taking it on a regular basis. Sometimes feels like he could vomit first thing in the morning.   States swallowing has been having some distal esophageal pain and eve sometimes when he drinks something. He states it hurt constantly for 3 days and then it eased up - the last 2 days that's not bothering him like it was previously.   Still has Linzess  and is having a BM about ever other  day, sometimes daily. Still hard to go and is having to strain. Not taking anything else in addition to it. No melena or brbpr. If he hasn't had a bm in 2 days he will have some back pain and then when he is able to go and get it all out he feels like his back is cracking and he feels better.   Wt Readings from Last 7 Encounters:  08/06/23 180 lb 9.6 oz (81.9 kg)   05/07/23 188 lb (85.3 kg)  01/01/23 190 lb (86.2 kg)  03/20/22 186 lb (84.4 kg)  02/26/22 182 lb 6.4 oz (82.7 kg)  02/11/22 189 lb 9.5 oz (86 kg)  05/22/21 200 lb (90.7 kg)    Current Outpatient Medications  Medication Sig Dispense Refill   ALPRAZolam  (XANAX ) 0.5 MG tablet Take 0.5 mg by mouth 3 (three) times daily.  2   aspirin  EC 81 MG tablet Take 1 tablet (81 mg total) by mouth daily with breakfast. 120 tablet 3   aspirin -acetaminophen -caffeine  (EXCEDRIN  MIGRAINE) 250-250-65 MG tablet Take 1 tablet by mouth every 6 (six) hours as needed for headache or migraine.     atorvastatin  (LIPITOR ) 20 MG tablet Take 20 mg by mouth daily.     buPROPion  (WELLBUTRIN  XL) 300 MG 24 hr tablet Take 300 mg by mouth daily.     dexlansoprazole (DEXILANT) 60 MG capsule Take 1 capsule by mouth daily.     diclofenac Sodium (VOLTAREN) 1 % GEL Apply 2 g topically daily as needed (pain).     diltiazem  (TIAZAC ) 420 MG 24 hr capsule Take 420 mg by mouth daily.     DULoxetine  (CYMBALTA ) 60 MG capsule Take 60 mg by mouth daily.     furosemide  (LASIX ) 20 MG tablet Take 20 mg by mouth daily.     hydrALAZINE  (APRESOLINE ) 10 MG tablet Take 10 mg by mouth See admin instructions. Take 1 tablet by mouth daily (orange tablet)     insulin  glargine (LANTUS ) 100 UNIT/ML injection Inject 80 Units into the skin daily.     linaclotide  (LINZESS ) 290 MCG CAPS capsule Take 1 capsule (290 mcg total) by mouth daily before breakfast. 90 capsule 3   metFORMIN  (GLUCOPHAGE ) 500 MG tablet Take 1,000 mg by mouth in the morning and at bedtime. In the morning & 1300     metoCLOPramide  (REGLAN ) 10 MG tablet Take 1 tablet (10 mg total) by mouth every 6 (six) hours. 30 tablet 0   Multiple Vitamin (MULTIVITAMIN WITH MINERALS) TABS tablet Take 1 tablet by mouth daily.     ondansetron  (ZOFRAN ) 4 MG tablet Take 4 mg by mouth 4 (four) times daily.     oxyCODONE  (ROXICODONE ) 15 MG immediate release tablet Take 15 mg by mouth every 4 (four) hours.      pregabalin  (LYRICA ) 50 MG capsule TAKE 1 CAPSULE(50 MG) BY MOUTH THREE TIMES DAILY 60 capsule 0   sitaGLIPtin (JANUVIA) 100 MG tablet Take 100 mg by mouth daily.     XTAMPZA  ER 18 MG C12A Take 1 capsule by mouth 2 (two) times daily.     No current facility-administered medications for this visit.    Past Medical History:  Diagnosis Date   Acute biliary pancreatitis 10/23/2013   Acute osteomyelitis of right foot (HCC) 05/22/2021   AKI (acute kidney injury) (HCC) 05/22/2021   Anxiety    Arthritis    Biliary colic 10/23/2013   Chronic osteomyelitis of toe of right foot (HCC)    Depression    Diabetes mellitus  GERD (gastroesophageal reflux disease)    Headache(784.0)    History of kidney stones    Hypertension    Kidney stones    Neuropathy    Osteomyelitis of fourth toe of right foot (HCC) 06/16/2019   PONV (postoperative nausea and vomiting)     Past Surgical History:  Procedure Laterality Date   ABDOMINAL AORTOGRAM W/LOWER EXTREMITY N/A 02/10/2022   Procedure: ABDOMINAL AORTOGRAM W/LOWER EXTREMITY;  Surgeon: Lanis Fonda BRAVO, MD;  Location: [facility];  Service: Cardiovascular;  Laterality: N/A;   ACHILLES TENDON SURGERY Right 05/26/2021   Procedure: ACHILLES LENGTHENING/KIDNER;  Surgeon: Silva Juliene SAUNDERS, DPM;  Location: [facility];  Service: Podiatry;  Laterality: Right;   ACHILLES TENDON SURGERY Left 02/11/2022   Procedure: ACHILLES LENGTHENING/KIDNER;  Surgeon: Silva Juliene SAUNDERS, DPM;  Location: [facility] OR;  Service: Podiatry;  Laterality: Left;   AMPUTATION Left 02/11/2022   Procedure: LEFT TRANSMETATARSAL AMPUTATION;  Surgeon: Silva Juliene SAUNDERS, DPM;  Location: [facility];  Service: Podiatry;  Laterality: Left;   AMPUTATION TOE Right 06/21/2019   Procedure: AMPUTATION TOE;  Surgeon: Mavis Anes, MD;  Location: [facility];  Service: General;  Laterality: Right;   AMPUTATION TOE Right 08/29/2020   Procedure: RIGHT THIRD AMPUTATION TOE;  Surgeon: Mavis Anes, MD;  Location: [facility];   Service: General;  Laterality: Right;  third toe   AMPUTATION TOE Right 02/20/2021   Procedure: AMPUTATION  TRANSMATARSAL  RIGHT; GREAT TOE;  Surgeon: Mavis Anes, MD;  Location: [facility];  Service: General;  Laterality: Right;   AMPUTATION TOE Left 02/07/2022   Procedure: AMPUTATION TOE;  Surgeon: Silva Juliene SAUNDERS, DPM;  Location: [facility];  Service: Podiatry;  Laterality: Left;  amputation of remainder of Left 4th toe   BACK SURGERY  2022   CARDIAC CATHETERIZATION  01/21/2007   CHOLECYSTECTOMY N/A 10/24/2013   Procedure: LAPAROSCOPIC CHOLECYSTECTOMY;  Surgeon: Anes DELENA Mavis, MD;  Location: [facility];  Service: General;  Laterality: N/A;   COLONOSCOPY N/A 03/31/2013   Procedure: COLONOSCOPY;  Surgeon: Claudis RAYMOND Rivet, MD;  Location: [facility];  Service: Endoscopy;  Laterality: N/A;  730   COLONOSCOPY N/A 05/18/2023   Procedure: COLONOSCOPY;  Surgeon: Cindie Carlin POUR, DO;  Location: [facility];  Service: Endoscopy;  Laterality: N/A;  215pm, asa 2   ESOPHAGEAL DILATION N/A 05/18/2023   Procedure: DILATION, ESOPHAGUS;  Surgeon: Cindie Carlin POUR, DO;  Location: [facility];  Service: Endoscopy;  Laterality: N/A;   ESOPHAGOGASTRODUODENOSCOPY N/A 05/18/2023   Procedure: EGD (ESOPHAGOGASTRODUODENOSCOPY);  Surgeon: Cindie Carlin POUR, DO;  Location: [facility];  Service: Endoscopy;  Laterality: N/A;   IRRIGATION AND DEBRIDEMENT FOOT Right 04/19/2021   Procedure: IRRIGATION AND DEBRIDEMENT RIGHT SECOND TOE;  Surgeon: Mavis Anes, MD;  Location: [facility];  Service: General;  Laterality: Right;   SECONDARY CLOSURE OF WOUND Right 04/19/2021   Procedure: IRRIGATION AND DEBRIDEMENT SECONDARY CLOSURE OF RIGHT GREAT TOE WOUND;  Surgeon: Mavis Anes, MD;  Location: [facility];  Service: General;  Laterality: Right;   TRANSMETATARSAL AMPUTATION Right 05/24/2021   Procedure: TRANSMETATARSAL AMPUTATION;  Surgeon: Silva Juliene SAUNDERS, DPM;  Location: [facility];  Service: Podiatry;  Laterality: Right;   TRANSMETATARSAL  AMPUTATION Right 05/26/2021   Procedure: TRANSMETATARSAL AMPUTATION Revision;  Surgeon: Silva Juliene SAUNDERS, DPM;  Location: [facility] OR;  Service: Podiatry;  Laterality: Right;    Family History  Problem Relation Age of Onset   Diabetes Mother    Heart failure Mother    Heart disease Mother  Heart failure Father     Allergies as of 08/06/2023 - Review Complete 08/06/2023  Allergen Reaction Noted   Latex Rash and Other (See Comments) 10/05/2019    Social History   Socioeconomic History   Marital status: Divorced    Spouse name: Not on file   Number of children: Not on file   Years of education: Not on file   Highest education level: Not on file  Occupational History   Not on file  Tobacco Use   Smoking status: Former    Current packs/day: 0.00    Average packs/day: 1 pack/day for 7.0 years (7.0 ttl pk-yrs)    Types: Cigarettes    Start date: 01/31/1975    Quit date: 01/30/1982    Years since quitting: 41.5   Smokeless tobacco: Never  Vaping Use   Vaping status: Never Used  Substance and Sexual Activity   Alcohol use: No   Drug use: No   Sexual activity: Not on file  Other Topics Concern   Not on file  Social History Narrative   Not on file   Social Drivers of Health   Financial Resource Strain: Not on file  Food Insecurity: Not on file  Transportation Needs: Not on file  Physical Activity: Not on file  Stress: Not on file  Social Connections: Unknown (06/04/2021)   Received from [facility]   Social Network    Social Network: Not on file     Review of Systems   Gen: Denies fever, chills, anorexia. Denies fatigue, weakness, weight loss.  CV: Denies chest pain, palpitations, syncope, peripheral edema, and claudication. Resp: Denies dyspnea at rest, cough, wheezing, coughing up blood, and pleurisy. GI: See HPI Derm: Denies rash, itching, dry skin Psych: Denies depression, anxiety, memory loss, confusion. No homicidal or suicidal ideation.  Heme: Denies  bruising, bleeding, and enlarged lymph nodes.  Physical Exam   BP 117/69 (BP Location: Right Arm, Patient Position: Sitting, Cuff Size: Large)   Pulse 97   Temp 97.9 F (36.6 C) (Temporal)   Ht 5' 8 (1.727 m)   Wt 180 lb 9.6 oz (81.9 kg)   BMI 27.46 kg/m   General:   Alert and oriented. No distress noted. Pleasant and cooperative.  Head:  Normocephalic and atraumatic. Eyes:  Conjuctiva clear without scleral icterus. Abdomen:  +BS, soft, non-tender and non-distended. No rebound or guarding. No HSM or masses noted. Rectal: deferred Msk:  Symmetrical without gross deformities. Normal posture. Extremities:  double partial foot amputation.  Neurologic:  Alert and  oriented x4 Psych:  Alert and cooperative. Normal mood and affect.  Assessment  [name] is a 63 y.o. male presenting today with abdominal pain, nausea, and vomiting.   GERD, dysphagia, history of esophagitis, odynophagia:  - Recent EGD with gastritis with biopsies negative for H. pylori - Has had history of esophagitis on prior EGD in 2012 - Currently maintained on Dexilant 60 mg once daily.  Denies any significant indigestion/heartburn symptoms. - Continues to have nausea as below, gastritis could be a potential etiology. - Denies any overt esophageal dysphagia symptoms but does report some odynophagia with foods and at times liquids in the lower chest/epigastric region. - Continues to have lack of appetite.  There is a documented loss of about 6-8 pounds since his last visit in February.  Per review of prior weights though he has been 180 in the past as well in February 2024.   Nausea, vomiting:  - Will trail Reglan  10 mg  BID - counseled to monitor for side effects/TD - Multiple differentials include ongoing gastritis/esophagitis, gastroparesis, constipation - Due to lack of appetite he - Gastroparesis is a strong differential given diabetes and chronic opioid use.  Has Reglan  on medication list as a  short-term medication, he does not remember if this was helpful or not therefore we will retry medication.  Counseled on potential side effects  Constipation, opioid induced:  - Takes oxycodone  20 mg 2-4 times daily -Has been on Linzess  290 mcg but still not having a daily bowel movement. -If he goes more than 48 hours without a bowel movement he starts to have back pain which immediately improved after bowel movement. - Given the amount of opioids he takes, he would likely benefit from Movantik .  Discussed this today and will send in prescription given we do not have samples here in the office.  Advised him not to take Linzess  while he is taking Movantik .  RUQ pain: palpable mobile nodule in the RUQ and tender on examination. History of cholecystectomy.  Unclear if scar tissue or other unknown etiology.  Was not identified on recent CT scan therefore will assess with ultrasound.  PLAN   Reglan  10 mg BID Continue dexilant 60 mg daily.  Continue Zofran  4 mg every 6 hours as needed Trial Movantik  25 mg daily, stop Linzess  once filled.  RUQ US  Follow up 6-8 weeks   Charmaine Melia, MSN, FNP-BC, AGACNP-BC [facility]

## 2023-08-06 ENCOUNTER — Encounter: Payer: Self-pay | Admitting: Gastroenterology

## 2023-08-06 ENCOUNTER — Telehealth: Payer: Self-pay | Admitting: *Deleted

## 2023-08-06 ENCOUNTER — Ambulatory Visit: Admitting: Gastroenterology

## 2023-08-06 VITALS — BP 117/69 | HR 97 | Temp 97.9°F | Ht 68.0 in | Wt 180.6 lb

## 2023-08-06 DIAGNOSIS — R1310 Dysphagia, unspecified: Secondary | ICD-10-CM

## 2023-08-06 DIAGNOSIS — K219 Gastro-esophageal reflux disease without esophagitis: Secondary | ICD-10-CM | POA: Diagnosis not present

## 2023-08-06 DIAGNOSIS — R112 Nausea with vomiting, unspecified: Secondary | ICD-10-CM | POA: Diagnosis not present

## 2023-08-06 DIAGNOSIS — T402X5A Adverse effect of other opioids, initial encounter: Secondary | ICD-10-CM | POA: Diagnosis not present

## 2023-08-06 DIAGNOSIS — R1011 Right upper quadrant pain: Secondary | ICD-10-CM

## 2023-08-06 DIAGNOSIS — K5903 Drug induced constipation: Secondary | ICD-10-CM

## 2023-08-06 DIAGNOSIS — R1901 Right upper quadrant abdominal swelling, mass and lump: Secondary | ICD-10-CM | POA: Diagnosis not present

## 2023-08-06 MED ORDER — NALOXEGOL OXALATE 25 MG PO TABS: 25.0000 mg | ORAL_TABLET | Freq: Every day | ORAL | 3 refills | Status: DC

## 2023-08-06 MED ORDER — METOCLOPRAMIDE HCL 10 MG PO TABS: 10.0000 mg | ORAL_TABLET | Freq: Two times a day (BID) | ORAL | 1 refills | Status: DC

## 2023-08-06 NOTE — Patient Instructions (Addendum)
 I have sent in metoclopramide  (Reglan ) for you to take 10 mg twice a day to see if this helps control your nausea.  I have attached a handout regarding signs/symptoms for you to look out for and to stop immediately if you have any significant involuntary movements that occur.  For now you may also continue to take Zofran  up to every 6 hours as needed between your doses of Reglan .  Please monitor for fast heart rate, dizziness, chest pain, etc.  For now would you to stop Linzess  290 after you get your Movantik  prescription from the pharmacy.  For Movantik  you will take 25 mg once a day to see if this helps you with better bowel movements.  In regards to the palpable area in your right upper quadrant and your right upper quadrant pain we are ordering an ultrasound for further evaluation.  For now continue to take your Dexilant 60 mg once daily, you did have some inflammation on your recent upper endoscopy therefore we could consider trial of a different medication in the future if needed.  I will see you for follow-up in 6 to 8 weeks or sooner if needed.  Please let hesitate to reach out if your symptoms worsen.  It was a pleasure to see you today. I want to create trusting relationships with patients. If you receive a survey regarding your visit,  I greatly appreciate you taking time to fill this out on paper or through your MyChart. I value your feedback.  Charmaine Melia, MSN, FNP-BC, AGACNP-BC [facility]

## 2023-08-06 NOTE — Telephone Encounter (Signed)
 Called pt and he is aware of appt details for US 

## 2023-08-11 ENCOUNTER — Ambulatory Visit (HOSPITAL_COMMUNITY)
Admission: RE | Admit: 2023-08-11 | Discharge: 2023-08-11 | Disposition: A | Discharge status: Home / Self Care | Source: Ambulatory Visit | Attending: Gastroenterology | Admitting: Gastroenterology

## 2023-08-11 DIAGNOSIS — R1901 Right upper quadrant abdominal swelling, mass and lump: Secondary | ICD-10-CM | POA: Diagnosis not present

## 2023-08-11 DIAGNOSIS — R1011 Right upper quadrant pain: Secondary | ICD-10-CM | POA: Diagnosis not present

## 2023-08-11 DIAGNOSIS — R1900 Intra-abdominal and pelvic swelling, mass and lump, unspecified site: Secondary | ICD-10-CM | POA: Diagnosis not present

## 2023-08-14 DIAGNOSIS — M48061 Spinal stenosis, lumbar region without neurogenic claudication: Secondary | ICD-10-CM | POA: Diagnosis not present

## 2023-08-14 DIAGNOSIS — M4316 Spondylolisthesis, lumbar region: Secondary | ICD-10-CM | POA: Diagnosis not present

## 2023-08-14 DIAGNOSIS — M47816 Spondylosis without myelopathy or radiculopathy, lumbar region: Secondary | ICD-10-CM | POA: Diagnosis not present

## 2023-08-18 ENCOUNTER — Ambulatory Visit: Payer: Self-pay | Admitting: Gastroenterology

## 2023-08-21 DIAGNOSIS — G894 Chronic pain syndrome: Secondary | ICD-10-CM | POA: Diagnosis not present

## 2023-08-21 DIAGNOSIS — E1129 Type 2 diabetes mellitus with other diabetic kidney complication: Secondary | ICD-10-CM | POA: Diagnosis not present

## 2023-08-21 DIAGNOSIS — M67911 Unspecified disorder of synovium and tendon, right shoulder: Secondary | ICD-10-CM | POA: Diagnosis not present

## 2023-08-21 DIAGNOSIS — E1140 Type 2 diabetes mellitus with diabetic neuropathy, unspecified: Secondary | ICD-10-CM | POA: Diagnosis not present

## 2023-08-21 DIAGNOSIS — M5416 Radiculopathy, lumbar region: Secondary | ICD-10-CM | POA: Diagnosis not present

## 2023-08-21 DIAGNOSIS — I10 Essential (primary) hypertension: Secondary | ICD-10-CM | POA: Diagnosis not present

## 2023-08-21 DIAGNOSIS — E1142 Type 2 diabetes mellitus with diabetic polyneuropathy: Secondary | ICD-10-CM | POA: Diagnosis not present

## 2023-08-27 ENCOUNTER — Ambulatory Visit: Payer: Self-pay

## 2023-08-27 NOTE — Telephone Encounter (Signed)
 FYI Only or Action Required?: FYI only for provider.  Patient was last seen in primary care on N/A.  Called Nurse Triage reporting Back Pain.    Triage Disposition: Information or Advice Only Call  Patient/caregiver understands and will follow disposition?: Yes            Copied from CRM [id number]. Topic: Clinical - Red Word Triage >> Aug 27, 2023 10:36 AM [name] wrote: Red Word that prompted transfer to Nurse Triage: a little bit of back pain   ----------------------------------------------------------------------- From previous Reason for Contact - Scheduling: Patient/patient representative is calling to schedule an appointment. Refer to attachments for appointment information. Reason for Disposition  General information question, no triage required and triager able to answer question  Answer Assessment - Initial Assessment Questions Patient called attempting to set up a New Patient appointment due to his PCP [facility] closing down at the end of September--September 27th Patient's last appointment is September 3rd or 4th. He states that he wanted to get established at another facility before they closed so the transition was smooth.   Patient states that he has had chronic back issues for 2-3 years and has medication for it.  He denies any new pain or complaints.  He also states that nothing has changed recently or in the last two weeks nor has he had to go to the Emergency Room in the past two weeks. This RN looked at the schedules for New Patients at [facility], [facility], [facility], and [facility].  None had any appointments before the end of October and patient states he wants to be established prior to then at a new PCP office. He states that he has a few other places that he was going to try. He is advised that if we can be of any further assistance or if he needs any help after he calls  them to give us  a call back. Patient verbalized understanding.  Protocols used: Information Only Call - No Triage-A-AH

## 2023-08-28 ENCOUNTER — Encounter: Payer: Self-pay | Admitting: Podiatry

## 2023-08-28 ENCOUNTER — Ambulatory Visit (INDEPENDENT_AMBULATORY_CARE_PROVIDER_SITE_OTHER): Admitting: Podiatry

## 2023-08-28 DIAGNOSIS — M7752 Other enthesopathy of left foot: Secondary | ICD-10-CM | POA: Diagnosis not present

## 2023-08-28 DIAGNOSIS — M7751 Other enthesopathy of right foot: Secondary | ICD-10-CM | POA: Diagnosis not present

## 2023-08-28 DIAGNOSIS — M779 Enthesopathy, unspecified: Secondary | ICD-10-CM | POA: Diagnosis not present

## 2023-08-28 MED ORDER — TRIAMCINOLONE ACETONIDE 10 MG/ML IJ SUSP
10.0000 mg | Freq: Once | INTRAMUSCULAR | Status: AC
  Administered 2023-08-28: 10 mg via INTRA_ARTICULAR

## 2023-08-28 MED ORDER — PREGABALIN 50 MG PO CAPS: 50.0000 mg | ORAL_CAPSULE | Freq: Three times a day (TID) | ORAL | 0 refills | Status: DC

## 2023-08-31 ENCOUNTER — Other Ambulatory Visit: Payer: Self-pay | Admitting: Podiatry

## 2023-08-31 ENCOUNTER — Ambulatory Visit: Admitting: Podiatry

## 2023-08-31 NOTE — Progress Notes (Signed)
 Subjective:   Patient ID: [name], male   DOB: 63 y.o.   MRN: [id number]   HPI Patient presents stating started to develop pain again was doing pretty well   ROS      Objective:  Physical Exam  Neurovascular status intact with discomfort dorsal aspect both feet around the extensor complex of a little over 5 months duration which has been hard to walk on     Assessment:  Acute tendinitis dorsal bilateral 5 months relief     Plan:  H&P reviewed careful injection 3 mg dexamethasone  Kenalog  5 mg Xylocaine  after explaining risk of injection and applied sterile dressing.  Reappoint to recheck as symptoms indicate

## 2023-09-01 ENCOUNTER — Other Ambulatory Visit: Payer: Self-pay | Admitting: Lab

## 2023-09-01 ENCOUNTER — Telehealth: Payer: Self-pay | Admitting: Podiatry

## 2023-09-01 MED ORDER — PREGABALIN 50 MG PO CAPS: 50.0000 mg | ORAL_CAPSULE | Freq: Three times a day (TID) | ORAL | 0 refills | Status: AC

## 2023-09-01 NOTE — Telephone Encounter (Signed)
 Patient called stating that the pharmacy informed him the medication was rejected. I contacted the pharmacy for clarification, and they explained that the prescription is marked as rejected in their system if there is no response from the provider within a certain timeframe. They also noted that they have not received the prescription request that was sent on 08/31/23.  Requesting that the prescription be re-sent to the pharmacy. Thank you   pregabalin  (LYRICA ) 50 MG capsule  [facility] [phone] - [place], [place] - [address] [facility] [address]

## 2023-09-01 NOTE — Telephone Encounter (Signed)
 Sent if any other concerns he can call us  back.

## 2023-09-01 NOTE — Telephone Encounter (Signed)
 Thank you for the update!

## 2023-09-07 ENCOUNTER — Encounter: Payer: Self-pay | Admitting: Gastroenterology

## 2023-10-08 ENCOUNTER — Other Ambulatory Visit (HOSPITAL_COMMUNITY): Payer: Self-pay

## 2023-10-08 DIAGNOSIS — I10 Essential (primary) hypertension: Secondary | ICD-10-CM

## 2023-10-13 ENCOUNTER — Ambulatory Visit (HOSPITAL_COMMUNITY): Admission: RE | Admit: 2023-10-13 | Source: Ambulatory Visit

## 2023-10-19 ENCOUNTER — Ambulatory Visit (HOSPITAL_COMMUNITY)
Admission: RE | Admit: 2023-10-19 | Discharge: 2023-10-19 | Disposition: A | Discharge status: Home / Self Care | Source: Ambulatory Visit

## 2023-10-19 DIAGNOSIS — I10 Essential (primary) hypertension: Secondary | ICD-10-CM | POA: Diagnosis not present

## 2023-10-29 ENCOUNTER — Ambulatory Visit: Admitting: Internal Medicine

## 2023-11-26 ENCOUNTER — Ambulatory Visit: Admitting: Podiatry

## 2023-12-02 ENCOUNTER — Encounter: Payer: Self-pay | Admitting: Podiatry

## 2023-12-02 ENCOUNTER — Ambulatory Visit: Admitting: Podiatry

## 2023-12-02 DIAGNOSIS — M779 Enthesopathy, unspecified: Secondary | ICD-10-CM

## 2023-12-02 MED ORDER — TRIAMCINOLONE ACETONIDE 10 MG/ML IJ SUSP
10.0000 mg | Freq: Once | INTRAMUSCULAR | Status: AC
  Administered 2023-12-02: 10 mg via INTRA_ARTICULAR

## 2023-12-02 NOTE — Progress Notes (Signed)
 GI Office Note    Referring Provider: Bertell Satterfield, MD Primary Care Physician:  Shona Norleen PEDLAR, MD Primary Gastroenterologist: Carlin POUR. Cindie, DO  Date:  12/03/2023  ID:  [name], DOB 1960-02-09, MRN [id number]   Chief Complaint   Chief Complaint  Patient presents with   Follow-up    Follow up. Still having issues with his stomach. Nausea and vomiting    History of Present Illness  [name] is a 63 y.o. male with a history of GERD, diabetes, depression/anxiety, biliary pancreatitis s/p cholecystectomy 2015, HTN, and osteomyelitis presenting today with complaint of nausea and vomiting.   EGD August 2012 by Dr. Golda: - Focal acute esophagitis, questionably pill induced - Erosive antral gastritis - Pathology confirmed erosive esophagitis - Advised Carafate  4 times daily, daily PPI.   Colonoscopy March 2015 by Dr. Golda: - 3 tubular adenomas removed from cecum and ascending colon - Mild sigmoid diverticulosis - Small external hemorrhoids -Pathology negative for H. pylori - Advised high-fiber diet. - Advised repeat colonoscopy in 5 years.   OV 05/07/2023 with Dr. Cindie.  Referred by Dr. Bertell.  Reported history of chronic GERD for many years and was taking Dexilant 60 mg daily with relatively good control of symptoms.  Noted progressively worsening esophageal dysphagia and also some issues with nausea and vomiting for which she was taking Pepto-Bismol.  Noted some recent worsening constipation, going multiple days of a bowel movement and stools have been [place] 1 with some occasional straining.  Admitted to chronic opioids.  Advised EGD with possible dilation given GERD, nausea, vomiting, and dysphagia.  Also advised colonoscopy given history of colon polyps.  Advised to continue Dexilant.  Provided samples of Linzess  290 mcg.   Patient called in reporting he could not keep down the prep, had multiple episodes of vomiting.   EGD 05/18/2023: - Mild  Schatzki's ring s/p dilation up to 20 mm (moderate improvement of luminal narrowing) - Gastritis s/p biopsy - Normal 1st and 2nd portion of duodenum. - Pathology: Reactive epithelial changes.  Negative H. pylori. - Repeat EGD as needed.   Colonoscopy 05/18/2023: - Inadequate colon prep (large amount of semiformed stool in the entire colon, lavaged with incomplete clearance) - Left-sided diverticulosis - Advised repeat colonoscopy in 3-6 months given subpar prep   Telephone note 05/25/2023 with patient stated that he talked with Dr. Bertell and in their conversation it was reported that given he was still having problems Dr. Bertell wanted him to have a capsule study done.  Last office visit 08/06/23.  Morning nausea for the 2 weeks prior and take his nausea pill which is not helping any other right upper quadrant or epigastric pain.  Couple episodes of vomiting, not much of an appetite, and smell of food to his mouth.  No recent sick contacts.  Does admit to chronic opioids.  Sometimes Zofran  does not help and he has been taking however basis and sometimes feels that he can vomit first thing in the morning.  Have been having some distal odynophagia, previously hurt for 3 days constantly then eventually eased up and the 2 days prior to his visit was not having any issue.  Having BM every other day, sometimes daily but is still hard to go and having to strain and not taking anything else in addition to his Linzess , back pain is relieved after having bowel movement.  Advised Reglan  10 mg BID and Dexilant 60 mg daily recommended. Continue Zofran  as needed and advised  trial of Movantik  (stop Linzess ). RUQ US  ordered.   RUQ US  08/16/23: - in area of small palpable mass no abnormality was identified - evidence of cholecystectomy  Today:  Discussed the use of AI scribe software for clinical note transcription with the patient, who gave verbal consent to proceed.  He experiences ongoing nausea, vomiting, and  abdominal pain, which worsen with irregular bowel movements. Nausea and vomiting are particularly severe when he goes two days without a bowel movement, leading to dry heaves and significant abdominal pain. He often wakes up feeling nauseous and begins vomiting under these circumstances.  He takes Linzess  and Movantik  to manage his symptoms, which help him have more regular bowel movements and reduces nausea and vomiting. Initially, he stopped the Linzes any only took Movantik  but resumed both when he would feel nausea after2 days wtihout a BM. Taking both medications allows him to use the restroom more regularly and feel better.  He has a history of acid reflux and has been taking Dexilant, which he has been without for some time. He associates the absence of this medication with worsening symptoms. He has a history of upper endoscopy showing inflammation and previously drank alcohol heavily, which he stopped 30-35 years ago after experiencing severe symptoms, including hematemesis.  He has not been taking Zofran  for nausea due to issues with pharmacy refills and has been without it for some time. He describes significant difficulty in managing his symptoms without these medications, impacting his ability to attend appointments and perform daily activities.  No chest pain or shortness of breath. He quit smoking around the same time he stopped drinking. He reports occasional relief of symptoms when passing gas. No belching, blood in stool, or melena.      Wt Readings from Last 6 Encounters:  12/03/23 185 lb (83.9 kg)  08/06/23 180 lb 9.6 oz (81.9 kg)  05/07/23 188 lb (85.3 kg)  01/01/23 190 lb (86.2 kg)  03/20/22 186 lb (84.4 kg)  02/26/22 182 lb 6.4 oz (82.7 kg)    Body mass index is 28.13 kg/m.   Current Outpatient Medications  Medication Sig Dispense Refill   ALPRAZolam  (XANAX ) 0.5 MG tablet Take 0.5 mg by mouth 3 (three) times daily.  2   aspirin  EC 81 MG tablet Take 1 tablet (81 mg  total) by mouth daily with breakfast. 120 tablet 3   aspirin -acetaminophen -caffeine  (EXCEDRIN  MIGRAINE) 250-250-65 MG tablet Take 1 tablet by mouth every 6 (six) hours as needed for headache or migraine.     atorvastatin  (LIPITOR ) 20 MG tablet Take 20 mg by mouth daily.     buPROPion  (WELLBUTRIN  XL) 300 MG 24 hr tablet Take 300 mg by mouth daily.     dexlansoprazole (DEXILANT) 60 MG capsule Take 1 capsule by mouth daily.     diclofenac Sodium (VOLTAREN) 1 % GEL Apply 2 g topically daily as needed (pain).     diltiazem  (TIAZAC ) 420 MG 24 hr capsule Take 420 mg by mouth daily.     DULoxetine  (CYMBALTA ) 60 MG capsule Take 60 mg by mouth daily.     furosemide  (LASIX ) 20 MG tablet Take 20 mg by mouth daily.     hydrALAZINE  (APRESOLINE ) 10 MG tablet Take 10 mg by mouth See admin instructions. Take 1 tablet by mouth daily (orange tablet)     insulin  glargine (LANTUS ) 100 UNIT/ML injection Inject 80 Units into the skin daily.     linaclotide  (LINZESS ) 290 MCG CAPS capsule Take 1 capsule (290 mcg total)  by mouth daily before breakfast. 90 capsule 3   metFORMIN  (GLUCOPHAGE ) 500 MG tablet Take 1,000 mg by mouth in the morning and at bedtime. In the morning & 1300     metoCLOPramide  (REGLAN ) 10 MG tablet Take 1 tablet (10 mg total) by mouth in the morning and at bedtime. 60 tablet 1   Multiple Vitamin (MULTIVITAMIN WITH MINERALS) TABS tablet Take 1 tablet by mouth daily.     naloxegol  oxalate (MOVANTIK ) 25 MG TABS tablet Take 1 tablet (25 mg total) by mouth daily. 30 tablet 3   ondansetron  (ZOFRAN ) 4 MG tablet Take 4 mg by mouth 4 (four) times daily.     oxyCODONE  (ROXICODONE ) 15 MG immediate release tablet Take 15 mg by mouth every 4 (four) hours.     pregabalin  (LYRICA ) 50 MG capsule Take 1 capsule (50 mg total) by mouth 3 (three) times daily. 60 capsule 0   sitaGLIPtin (JANUVIA) 100 MG tablet Take 100 mg by mouth daily.     XTAMPZA  ER 18 MG C12A Take 1 capsule by mouth 2 (two) times daily.     No  current facility-administered medications for this visit.    Past Medical History:  Diagnosis Date   Acute biliary pancreatitis 10/23/2013   Acute osteomyelitis of right foot (HCC) 05/22/2021   AKI (acute kidney injury) 05/22/2021   Anxiety    Arthritis    Biliary colic 10/23/2013   Chronic osteomyelitis of toe of right foot (HCC)    Depression    Diabetes mellitus    GERD (gastroesophageal reflux disease)    Headache(784.0)    History of kidney stones    Hypertension    Kidney stones    Neuropathy    Osteomyelitis of fourth toe of right foot (HCC) 06/16/2019   PONV (postoperative nausea and vomiting)     Past Surgical History:  Procedure Laterality Date   ABDOMINAL AORTOGRAM W/LOWER EXTREMITY N/A 02/10/2022   Procedure: ABDOMINAL AORTOGRAM W/LOWER EXTREMITY;  Surgeon: Lanis Fonda BRAVO, MD;  Location: [facility] INVASIVE CV LAB;  Service: Cardiovascular;  Laterality: N/A;   ACHILLES TENDON SURGERY Right 05/26/2021   Procedure: ACHILLES LENGTHENING/KIDNER;  Surgeon: Silva Juliene SAUNDERS, DPM;  Location: [facility] OR;  Service: Podiatry;  Laterality: Right;   ACHILLES TENDON SURGERY Left 02/11/2022   Procedure: ACHILLES LENGTHENING/KIDNER;  Surgeon: Silva Juliene SAUNDERS, DPM;  Location: [facility] OR;  Service: Podiatry;  Laterality: Left;   AMPUTATION Left 02/11/2022   Procedure: LEFT TRANSMETATARSAL AMPUTATION;  Surgeon: Silva Juliene SAUNDERS, DPM;  Location: [facility] OR;  Service: Podiatry;  Laterality: Left;   AMPUTATION TOE Right 06/21/2019   Procedure: AMPUTATION TOE;  Surgeon: Mavis Anes, MD;  Location: [facility] ORS;  Service: General;  Laterality: Right;   AMPUTATION TOE Right 08/29/2020   Procedure: RIGHT THIRD AMPUTATION TOE;  Surgeon: Mavis Anes, MD;  Location: [facility] ORS;  Service: General;  Laterality: Right;  third toe   AMPUTATION TOE Right 02/20/2021   Procedure: AMPUTATION  TRANSMATARSAL  RIGHT; GREAT TOE;  Surgeon: Mavis Anes, MD;  Location: [facility];  Service: General;  Laterality: Right;   AMPUTATION TOE Left  02/07/2022   Procedure: AMPUTATION TOE;  Surgeon: Silva Juliene SAUNDERS, DPM;  Location: [facility] OR;  Service: Podiatry;  Laterality: Left;  amputation of remainder of Left 4th toe   BACK SURGERY  2022   CARDIAC CATHETERIZATION  01/21/2007   CHOLECYSTECTOMY N/A 10/24/2013   Procedure: LAPAROSCOPIC CHOLECYSTECTOMY;  Surgeon: Anes DELENA Mavis, MD;  Location: [facility];  Service: General;  Laterality: N/A;  COLONOSCOPY N/A 03/31/2013   Procedure: COLONOSCOPY;  Surgeon: Claudis RAYMOND Rivet, MD;  Location: [facility];  Service: Endoscopy;  Laterality: N/A;  730   COLONOSCOPY N/A 05/18/2023   Procedure: COLONOSCOPY;  Surgeon: Cindie Carlin POUR, DO;  Location: [facility];  Service: Endoscopy;  Laterality: N/A;  215pm, asa 2   ESOPHAGEAL DILATION N/A 05/18/2023   Procedure: DILATION, ESOPHAGUS;  Surgeon: Cindie Carlin POUR, DO;  Location: [facility];  Service: Endoscopy;  Laterality: N/A;   ESOPHAGOGASTRODUODENOSCOPY N/A 05/18/2023   Procedure: EGD (ESOPHAGOGASTRODUODENOSCOPY);  Surgeon: Cindie Carlin POUR, DO;  Location: [facility];  Service: Endoscopy;  Laterality: N/A;   IRRIGATION AND DEBRIDEMENT FOOT Right 04/19/2021   Procedure: IRRIGATION AND DEBRIDEMENT RIGHT SECOND TOE;  Surgeon: Mavis Anes, MD;  Location: [facility];  Service: General;  Laterality: Right;   SECONDARY CLOSURE OF WOUND Right 04/19/2021   Procedure: IRRIGATION AND DEBRIDEMENT SECONDARY CLOSURE OF RIGHT GREAT TOE WOUND;  Surgeon: Mavis Anes, MD;  Location: [facility];  Service: General;  Laterality: Right;   TRANSMETATARSAL AMPUTATION Right 05/24/2021   Procedure: TRANSMETATARSAL AMPUTATION;  Surgeon: Silva Juliene SAUNDERS, DPM;  Location: [facility] OR;  Service: Podiatry;  Laterality: Right;   TRANSMETATARSAL AMPUTATION Right 05/26/2021   Procedure: TRANSMETATARSAL AMPUTATION Revision;  Surgeon: Silva Juliene SAUNDERS, DPM;  Location: [facility] OR;  Service: Podiatry;  Laterality: Right;    Family History  Problem Relation Age of Onset   Diabetes Mother    Heart  failure Mother    Heart disease Mother    Heart failure Father     Allergies as of 12/03/2023 - Review Complete 12/03/2023  Allergen Reaction Noted   Latex Rash and Other (See Comments) 10/05/2019    Social History   Socioeconomic History   Marital status: Divorced    Spouse name: Not on file   Number of children: Not on file   Years of education: Not on file   Highest education level: Not on file  Occupational History   Not on file  Tobacco Use   Smoking status: Former    Current packs/day: 0.00    Average packs/day: 1 pack/day for 7.0 years (7.0 ttl pk-yrs)    Types: Cigarettes    Start date: 01/31/1975    Quit date: 01/30/1982    Years since quitting: 41.8   Smokeless tobacco: Never  Vaping Use   Vaping status: Never Used  Substance and Sexual Activity   Alcohol use: No   Drug use: No   Sexual activity: Not on file  Other Topics Concern   Not on file  Social History Narrative   Not on file   Social Drivers of Health   Financial Resource Strain: Not on file  Food Insecurity: Not on file  Transportation Needs: Not on file  Physical Activity: Not on file  Stress: Not on file  Social Connections: Unknown (06/04/2021)   Received from [facility]   Social Network    Social Network: Not on file    Review of Systems   Gen: Denies fever, chills, anorexia. Denies fatigue, weakness, weight loss.  CV: Denies chest pain, palpitations, syncope, peripheral edema, and claudication. Resp: Denies dyspnea at rest, cough, wheezing, coughing up blood, and pleurisy. GI: See HPI Derm: Denies rash, itching, dry skin Psych: Denies depression, anxiety, memory loss, confusion. Heme: Denies bruising, bleeding, and enlarged lymph nodes.  Physical Exam   BP 138/84 (BP Location: Right Arm, Patient Position: Sitting, Cuff Size: Large)   Pulse 94  Temp 98.5 F (36.9 C) (Temporal)   Ht 5' 8 (1.727 m)   Wt 185 lb (83.9 kg)   BMI 28.13 kg/m   General:   Alert and oriented.  No distress noted. Pleasant and cooperative.  Head:  Normocephalic and atraumatic. Eyes:  Conjuctiva clear without scleral icterus. Mouth:  Oral mucosa pink and moist. Good dentition. No lesions. Lungs:  Clear to auscultation bilaterally. No wheezes, rales, or rhonchi. No distress.  Heart:  S1, S2 present without murmurs appreciated.  Abdomen:  +BS, soft, non-distended.  TTP to epigastrium and LUQ.  No rebound or guarding. No HSM or masses noted. Rectal: deferred Msk:  Symmetrical without gross deformities. Normal posture. Extremities:  Without edema. Neurologic:  Alert and  oriented x4 Psych:  Alert and cooperative. Normal mood and affect.  Assessment & Plan  [name] is a 63 y.o. male presenting today with ongoing nausea and vomiting.     Chronic nausea and vomiting, possible gastroparesis Chronic nausea and vomiting with possible gastroparesis secondary to opioid use and diabetes, exacerbated by irregular bowel movements. Symptoms include severe nausea upon waking, vomiting, and abdominal pain. Symptoms improve with regular bowel movements. Possible contribution from opioid use and lack of acid suppression therapy. - Prescribed Reglan  for nausea, especially before colonoscopy prep. - Ensured adequate supply of Zofran  for nausea management. - Continue Movantik  and Linzess  to improve bowel regularity. - Resume dexilant for control of GERD  Opioid-induced constipation Contributing to irregular bowel movements and exacerbating nausea and vomiting. Symptoms improve with regular use of Movantik  and Linzess . - Continue Movantik  25 mg daily. - Continue Linzess  290 mcg daily.  Gastroesophageal reflux disease (GERD) with gastritis, history of esophagitis and epigastric pain GERD with gastritis and esophagitis, contributing to nausea and epigastric pain. Symptoms exacerbated by lack of acid suppression therapy. Previous upper endoscopy showed inflammation. Previously on Dexilant  prescribed by dr bertell. - Prescribed Dexilant 60 mg daily for acid suppression. - Ensured adequate supply of Dexilant for 90 days.  History of colon polyps and anemia History of colon polyps and anemia. Previous colonoscopy attempts were unsuccessful due to inadequate bowel preparation. Current symptoms may affect colonoscopy preparation - will augment.  Last hgb in the 10 range (possible anemia of chronic disease)  - Proceed with colonoscopy with propofol  by Dr. Cindie  in near future: the risks, benefits, and alternatives have been discussed with the patient in detail. The patient states understanding and desires to proceed. ASA 3 - any room  Hold metformin  and Januvia morning of procedure  Half dose Lantus  night prior  Hold metformin  night prior to procedure  Continue Linzess  and Movantik  prior to procedure  Reglan  10 mg BID for 3 days prior to procedure.  Extra 1/2 day clears   Follow up   Follow up 3 months    Charmaine Melia, MSN, FNP-BC, AGACNP-BC [facility]

## 2023-12-03 ENCOUNTER — Ambulatory Visit (INDEPENDENT_AMBULATORY_CARE_PROVIDER_SITE_OTHER): Admitting: Gastroenterology

## 2023-12-03 ENCOUNTER — Encounter: Payer: Self-pay | Admitting: Gastroenterology

## 2023-12-03 ENCOUNTER — Telehealth: Payer: Self-pay | Admitting: *Deleted

## 2023-12-03 VITALS — BP 138/84 | HR 94 | Temp 98.5°F | Ht 68.0 in | Wt 185.0 lb

## 2023-12-03 DIAGNOSIS — D509 Iron deficiency anemia, unspecified: Secondary | ICD-10-CM | POA: Diagnosis not present

## 2023-12-03 DIAGNOSIS — Z860101 Personal history of adenomatous and serrated colon polyps: Secondary | ICD-10-CM

## 2023-12-03 DIAGNOSIS — K219 Gastro-esophageal reflux disease without esophagitis: Secondary | ICD-10-CM

## 2023-12-03 DIAGNOSIS — R112 Nausea with vomiting, unspecified: Secondary | ICD-10-CM | POA: Diagnosis not present

## 2023-12-03 DIAGNOSIS — K5903 Drug induced constipation: Secondary | ICD-10-CM

## 2023-12-03 DIAGNOSIS — G8929 Other chronic pain: Secondary | ICD-10-CM

## 2023-12-03 DIAGNOSIS — R1901 Right upper quadrant abdominal swelling, mass and lump: Secondary | ICD-10-CM

## 2023-12-03 DIAGNOSIS — R1013 Epigastric pain: Secondary | ICD-10-CM

## 2023-12-03 MED ORDER — METOCLOPRAMIDE HCL 10 MG PO TABS: ORAL_TABLET | ORAL | 1 refills | Status: AC

## 2023-12-03 MED ORDER — ONDANSETRON HCL 4 MG PO TABS: 4.0000 mg | ORAL_TABLET | Freq: Three times a day (TID) | ORAL | 2 refills | Status: AC | PRN

## 2023-12-03 MED ORDER — DEXLANSOPRAZOLE 60 MG PO CPDR: 1.0000 | DELAYED_RELEASE_CAPSULE | Freq: Every day | ORAL | 3 refills | Status: AC

## 2023-12-03 MED ORDER — NALOXEGOL OXALATE 25 MG PO TABS: 25.0000 mg | ORAL_TABLET | Freq: Every day | ORAL | 3 refills | Status: AC

## 2023-12-03 MED ORDER — LINACLOTIDE 290 MCG PO CAPS: 290.0000 ug | ORAL_CAPSULE | Freq: Every day | ORAL | 3 refills | Status: AC

## 2023-12-03 NOTE — Patient Instructions (Addendum)
 Medications refilled today: Linzess  290 mcg -take 1 capsule daily on empty stomach. Movantik  25 mg-take 1 tablet daily. Zofran  4 mg -take 1 tablet up to every 8 hours as needed for nausea Metoclopramide (Reglan ) 10 mg -you will take 1 tablet twice a day as needed for nausea for 3 days prior to your colonoscopy.  For constipation: You may take both your Linzess  and your Movantik  every day to help with bowel movements Follow high-fiber diet Goal is 60 ounces of water  daily  For nausea and vomiting, and reflux: Start back your Dexilant 60 mg once daily, 30 minutes prior to breakfast. Follow a GERD diet:  Avoid fried, fatty, greasy, spicy, citrus foods. Avoid caffeine  and carbonated beverages. Avoid chocolate. Try eating 4-6 small meals a day rather than 3 large meals. Do not eat within 3 hours of laying down. Prop head of bed up on wood or bricks to create a 6 inch incline. You may take Zofran  up to every 8 hours as needed for nausea I suspect that with more regular bowel movements and treatment of her acid reflux she should start to have improvement of symptoms.  We can trial more long-term use of Reglan  if needed in the future if having worsening symptoms.  We will get you rescheduled for your colonoscopy with Dr. Cindie.  It was a pleasure to see you today. I want to create trusting relationships with patients. If you receive a survey regarding your visit,  I greatly appreciate you taking time to fill this out on paper or through your MyChart. I value your feedback.  Charmaine Melia, MSN, FNP-BC, AGACNP-BC [facility]

## 2023-12-03 NOTE — Telephone Encounter (Signed)
 LMOVM to call back to schedule TCS Dr. Cindie, any room, see encounter form for med details

## 2023-12-04 NOTE — Progress Notes (Signed)
 Subjective:   Patient ID: [name], male   DOB: 63 y.o.   MRN: [id number]   HPI Patient states the top of his feet have started to hurt again and he is interested in having his tentative work done on his left foot to try to improve function by Dr. Silva   ROS      Objective:  Physical Exam  Neurovascular status unchanged from previous visit history of transmetatarsal amputation bilateral with quite a bit of tendinitis dorsally.  I typically like more time in between these visits but at this point goingto try to help him for the holidays and he will then see Dr. For possible other treatment and also possibility that he could have nerve resection by another physician in the group     Assessment:  Tendinitis dorsally with inflammation of the extensor complex     Plan:  Reviewed risk of doing injection sterile prep injected with half cc of dexamethasone  Kenalog  half cc Xylocaine  bilateral dorsal applied sterile dressing reappoint to be reevaluated

## 2023-12-08 NOTE — Telephone Encounter (Signed)
 LMOVM to call back

## 2023-12-09 ENCOUNTER — Ambulatory Visit: Payer: Self-pay

## 2023-12-10 NOTE — Telephone Encounter (Signed)
 Pt wants to be scheduled in Jan after the holidays. Advised pt that will call when we get providers schedule for Jan

## 2024-01-06 NOTE — H&P (Signed)
 Surgical History & Physical  Patient Name: [name]  DOB: 03/08/1960  Surgery: Cataract extraction with intraocular lens implant phacoemulsification; Right Eye Surgeon: Lynwood Hermann MD Surgery Date: 01/11/2024 Pre-Op Date: 12/31/2023  HPI: A 47 Yr. old male patient 1. The patient is here for a cataract eval. Pt. was referred by [facility] in [place] .Pt. complains of difficulty when driving at night. Both eyes are affected. The episode is constant. Symptoms occur when the patient is driving and reading. The complaint is associated with blurry vision. This is negatively affecting the patient's quality of life and the patient is unable to function adequately in life with the current level of vision. Pt. states blood sugar was 122 this AM. Pt. also states he has watery eyes x 6 months, pt. uses Visine, encouraged pt. to use a different preservative free drop instead.  Medical History:  Anxiety, neuropathy Diabetes High Blood Pressure  Review of Systems Cardiovascular High Blood Pressure Psychiatry Anxiety  Social Never Smoked  Medication Prednisolone-moxiflox-bromfen, Atorvastatin , Metformin , Hydralazine , Ondansetron  HCl, Amlodipine , Azelastine, Erythromycin , Alprazolam , Furosemide , Ondansetron , Gabapentin , Metoclopramide  HCl, Bupropion  HCl, Olmesartan, Januvia, Duloxetine , Lantus  Solostar U-100 Insulin , Linzess , Dexlansoprazole , Tiadylt  ER  Sx/Procedures Back Surgery, Toe Amputation, Gallbladder Removal  Drug Allergies  latex   History & Physical: Heent: cataracts NECK: supple without bruits LUNGS: lungs clear to auscultation CV: regular rate and rhythm Abdomen: soft and non-tender  Impression & Plan: Assessment: 1. COMBINED FORMS AGE RELATED CATARACT; Both Eyes (H25.813) 2. Diabetes Type 2 No retinopathy (E11.9) 3. DERMATOCHALASIS, no surgery; Right Upper Lid, Left Upper Lid (H02.831, H02.834) 4. BLEPHARITIS; Right Upper Lid, Right Lower Lid, Left Upper Lid, Left  Lower Lid (H01.001, H01.002,H01.004,H01.005) 5. ASTIGMATISM, REGULAR; Both Eyes (H52.223)  Plan: 1. Cataract accounts for the patient's decreased vision. This visual impairment is not correctable with a tolerable change in glasses or contact lenses. Cataract surgery with an implantation of a new lens should significantly improve the visual and functional status of the patient. Recommend phacoemulsification with intraocular lens. Discussed all risks, benefits, alternatives, and potential complications. Discussed the procedures and recovery. The patient desires to have surgery. A-scan/Biometry ordered and will be performed for intraocular lens calculations. The surgery will be performed in order to improve vision for driving, reading, and for eye examinations. Recommend Dextenza  for post-operative pain and inflammation. Educational materials provided: Cataract. History of corneal refractive Surgery: None History of Previous Ocular Surgery (PPV, other): None History of ocular trauma: None Use of Eye Pressure Lowering Drops: None No current contact lens use. Pupil Status: Dilates poorly - shugarcaine or Lidocaine +Omidira by protocol, Malyugin Ring Right Eye worse per pt. OD first, then OS.  2. Stressed importance of blood sugar and blood pressure control, and also yearly eye examinations. Discussed the need for ongoing proactive ocular exams and treatment, hopefully before visual symptoms develop. Diabetic correspondence sent to PCP/endocrinologist today and/or within the past year.  3. Asymptomatic, recommend observation for now. Findings, prognosis and treatment options reviewed.  4. Blepharitis is present - recommend regular lid cleaning.  5. Explained astigmatism to patient.

## 2024-01-07 ENCOUNTER — Inpatient Hospital Stay (HOSPITAL_COMMUNITY)
Admission: RE | Admit: 2024-01-07 | Discharge: 2024-01-07 | Disposition: A | Source: Ambulatory Visit | Attending: Ophthalmology

## 2024-01-08 NOTE — Pre-Procedure Instructions (Signed)
 Robin at [facility] notified that we have been unable to get in touch with patient.

## 2024-01-08 NOTE — Pre-Procedure Instructions (Signed)
 Attempted 2nd phone call for pre-op. Left him a 2nd message to call us  back.

## 2024-01-11 ENCOUNTER — Ambulatory Visit (HOSPITAL_COMMUNITY)
Admission: RE | Admit: 2024-01-11 | Discharge: 2024-01-11 | Disposition: A | Discharge status: Home / Self Care | Attending: Ophthalmology | Admitting: Ophthalmology

## 2024-01-11 ENCOUNTER — Ambulatory Visit (HOSPITAL_COMMUNITY): Admitting: Anesthesiology

## 2024-01-11 ENCOUNTER — Encounter (HOSPITAL_COMMUNITY): Payer: Self-pay | Admitting: Ophthalmology

## 2024-01-11 ENCOUNTER — Other Ambulatory Visit: Payer: Self-pay

## 2024-01-11 ENCOUNTER — Encounter: Admission: RE | Payer: Self-pay

## 2024-01-11 DIAGNOSIS — I10 Essential (primary) hypertension: Secondary | ICD-10-CM | POA: Insufficient documentation

## 2024-01-11 DIAGNOSIS — Z794 Long term (current) use of insulin: Secondary | ICD-10-CM | POA: Insufficient documentation

## 2024-01-11 DIAGNOSIS — F32A Depression, unspecified: Secondary | ICD-10-CM | POA: Insufficient documentation

## 2024-01-11 DIAGNOSIS — Z7984 Long term (current) use of oral hypoglycemic drugs: Secondary | ICD-10-CM | POA: Insufficient documentation

## 2024-01-11 DIAGNOSIS — Z87891 Personal history of nicotine dependence: Secondary | ICD-10-CM

## 2024-01-11 DIAGNOSIS — H25811 Combined forms of age-related cataract, right eye: Secondary | ICD-10-CM | POA: Insufficient documentation

## 2024-01-11 DIAGNOSIS — F419 Anxiety disorder, unspecified: Secondary | ICD-10-CM | POA: Diagnosis not present

## 2024-01-11 DIAGNOSIS — E1136 Type 2 diabetes mellitus with diabetic cataract: Secondary | ICD-10-CM | POA: Diagnosis not present

## 2024-01-11 HISTORY — PX: CATARACT EXTRACTION W/PHACO: SHX586

## 2024-01-11 LAB — GLUCOSE, CAPILLARY: Glucose-Capillary: 136 mg/dL — ABNORMAL HIGH (ref 70–99)

## 2024-01-11 SURGERY — PHACOEMULSIFICATION, CATARACT, WITH IOL INSERTION
Anesthesia: Monitor Anesthesia Care | Site: Eye | Laterality: Right

## 2024-01-11 MED ORDER — POVIDONE-IODINE 5 % OP SOLN
OPHTHALMIC | Status: DC | PRN
  Administered 2024-01-11: 1 via OPHTHALMIC

## 2024-01-11 MED ORDER — MIDAZOLAM HCL 2 MG/2ML IJ SOLN
INTRAMUSCULAR | Status: AC
  Filled 2024-01-11: qty 2

## 2024-01-11 MED ORDER — MOXIFLOXACIN HCL 5 MG/ML IO SOLN
INTRAOCULAR | Status: DC | PRN
  Administered 2024-01-11: .3 mL via INTRACAMERAL

## 2024-01-11 MED ORDER — BSS IO SOLN
INTRAOCULAR | Status: DC | PRN
  Administered 2024-01-11: 15 mL via INTRAOCULAR

## 2024-01-11 MED ORDER — STERILE WATER FOR IRRIGATION IR SOLN
Status: DC | PRN
  Administered 2024-01-11: 1

## 2024-01-11 MED ORDER — PHENYLEPHRINE-KETOROLAC 1-0.3 % IO SOLN
INTRAOCULAR | Status: DC | PRN
  Administered 2024-01-11: 500 mL via OPHTHALMIC

## 2024-01-11 MED ORDER — LACTATED RINGERS IV SOLN: INTRAVENOUS | Status: DC

## 2024-01-11 MED ORDER — LIDOCAINE HCL 3.5 % OP GEL
1.0000 | Freq: Once | OPHTHALMIC | Status: AC
  Administered 2024-01-11: 1 via OPHTHALMIC

## 2024-01-11 MED ORDER — PHENYLEPHRINE HCL 2.5 % OP SOLN
1.0000 [drp] | OPHTHALMIC | Status: AC | PRN
  Administered 2024-01-11 (×3): 1 [drp] via OPHTHALMIC

## 2024-01-11 MED ORDER — MIDAZOLAM HCL 5 MG/5ML IJ SOLN
INTRAMUSCULAR | Status: DC | PRN
  Administered 2024-01-11: 2 mg via INTRAVENOUS

## 2024-01-11 MED ORDER — TROPICAMIDE 1 % OP SOLN
1.0000 [drp] | OPHTHALMIC | Status: AC | PRN
  Administered 2024-01-11 (×3): 1 [drp] via OPHTHALMIC

## 2024-01-11 MED ORDER — SODIUM HYALURONATE 23MG/ML IO SOSY
PREFILLED_SYRINGE | INTRAOCULAR | Status: DC | PRN
  Administered 2024-01-11: .6 mL via INTRAOCULAR

## 2024-01-11 MED ORDER — TETRACAINE HCL 0.5 % OP SOLN
1.0000 [drp] | OPHTHALMIC | Status: AC | PRN
  Administered 2024-01-11 (×3): 1 [drp] via OPHTHALMIC

## 2024-01-11 MED ORDER — LIDOCAINE HCL (PF) 1 % IJ SOLN
INTRAMUSCULAR | Status: DC | PRN
  Administered 2024-01-11: 1 mL

## 2024-01-11 MED ORDER — SODIUM HYALURONATE 10 MG/ML IO SOLUTION
PREFILLED_SYRINGE | INTRAOCULAR | Status: DC | PRN
  Administered 2024-01-11: .85 mL via INTRAOCULAR

## 2024-01-11 SURGICAL SUPPLY — 11 items
CLOTH BEACON ORANGE TIMEOUT ST (SAFETY) ×1 IMPLANT
EYE SHIELD UNIVERSAL CLEAR (GAUZE/BANDAGES/DRESSINGS) IMPLANT
FEE CATARACT SUITE SIGHTPATH (MISCELLANEOUS) ×1 IMPLANT
GLOVE BIOGEL PI IND STRL 7.0 (GLOVE) ×2 IMPLANT
LENS IOL TECNIS EYHANCE 21.5 (Intraocular Lens) IMPLANT
NDL HYPO 18GX1.5 BLUNT FILL (NEEDLE) ×1 IMPLANT
NEEDLE HYPO 18GX1.5 BLUNT FILL (NEEDLE) ×1 IMPLANT
PAD ARMBOARD POSITIONER FOAM (MISCELLANEOUS) ×1 IMPLANT
SYR TB 1ML LL NO SAFETY (SYRINGE) ×1 IMPLANT
TAPE PAPER 2X10 WHT MICROPORE (GAUZE/BANDAGES/DRESSINGS) IMPLANT
WATER STERILE IRR 250ML POUR (IV SOLUTION) ×1 IMPLANT

## 2024-01-11 NOTE — Discharge Instructions (Addendum)
 Please discharge patient when stable, will follow up today with Dr. June Leap at the [facility] office immediately following discharge.  Leave shield in place until visit.  All paperwork with discharge instructions will be given at the office.  [facility] Address:  [address]  [place], [place] [address]

## 2024-01-11 NOTE — Op Note (Addendum)
 Date of procedure: 01/11/2024  Pre-operative diagnosis:  Visually significant combined form age-related cataract, Right Eye (H25.811)  Post-operative diagnosis:  Visually significant combined form age-related cataract, Right Eye (H25.811)  Procedure: Removal of cataract via phacoemulsification and insertion of intra-ocular lens Vicci and Johnson DIB00 +21.5D into the capsular bag of the Right Eye  Attending surgeon: Lynwood LABOR. Martice Doty, MD, MA  Anesthesia: MAC, Topical Akten   Complications: None  Estimated Blood Loss: <34mL (minimal)  Specimens: None  Implants: As above  Indications:  Visually significant age-related cataract, Right Eye  Procedure:  The patient was seen and identified in the pre-operative area. The operative eye was identified and dilated.  The operative eye was marked.  Topical anesthesia was administered to the operative eye.     The patient was then to the operative suite and placed in the supine position.  A timeout was performed confirming the patient, procedure to be performed, and all other relevant information.   The patient's face was prepped and draped in the usual fashion for intra-ocular surgery.  A lid speculum was placed into the operative eye and the surgical microscope moved into place and focused.  A superotemporal paracentesis was created using a 20 gauge paracentesis blade. Omidria  was injected into the anterior chamber. Shugarcaine was injected into the anterior chamber.  Viscoelastic was injected into the anterior chamber.  A temporal clear-corneal main wound incision was created using a 2.87mm microkeratome.  A continuous curvilinear capsulorrhexis was initiated using an irrigating cystitome and completed using capsulorrhexis forceps.  Hydrodissection and hydrodeliniation were performed.  Viscoelastic was injected into the anterior chamber.  A phacoemulsification handpiece and a chopper as a second instrument were used to remove the nucleus and epinucleus.  The irrigation/aspiration handpiece was used to remove any remaining cortical material.   The capsular bag was reinflated with viscoelastic, checked, and found to be intact.  The intraocular lens was inserted into the capsular bag.  The irrigation/aspiration handpiece was used to remove any remaining viscoelastic.  The clear corneal wound and paracentesis wounds were then hydrated and checked with Weck-Cels to be watertight. 0.1mL of Moxfloxacin was injected into the anterior chamber. The lid-speculum was removed.  The drape was removed.  The patient's face was cleaned with a wet and dry 4x4. A clear shield was taped over the eye. The patient was taken to the post-operative care unit in good condition, having tolerated the procedure well.  Post-Op Instructions: The patient will follow up at [facility] for a same day post-operative evaluation and will receive all other orders and instructions.

## 2024-01-11 NOTE — Interval H&P Note (Signed)
 History and Physical Interval Note:  01/11/2024 9:21 AM  [name] Nap  has presented today for surgery, with the diagnosis of combined forms age related cataract, right eye.  The various methods of treatment have been discussed with the patient and family. After consideration of risks, benefits and other options for treatment, the patient has consented to  Procedures: PHACOEMULSIFICATION, CATARACT, WITH IOL INSERTION (Right) as a surgical intervention.  The patient's history has been reviewed, patient examined, no change in status, stable for surgery.  I have reviewed the patient's chart and labs.  Questions were answered to the patient's satisfaction.     HARRIE AGENT

## 2024-01-11 NOTE — Transfer of Care (Signed)
 Immediate Anesthesia Transfer of Care Note  Patient: [name]  Procedure(s) Performed: PHACOEMULSIFICATION, CATARACT, WITH IOL INSERTION (Right: Eye)  Patient Location: PACU  Anesthesia Type:MAC  Level of Consciousness: awake, alert , and patient cooperative  Airway & Oxygen Therapy: Patient Spontanous Breathing  Post-op Assessment: Report given to RN, Post -op Vital signs reviewed and stable, and Patient moving all extremities X 4  Post vital signs: Reviewed and stable  Last Vitals:  Vitals Value Taken Time  BP 184/97 01/11/24 09:50  Temp 37.1 C 01/11/24 09:50  Pulse 88 01/11/24 09:50  Resp 13 01/11/24 09:50  SpO2 98 % 01/11/24 09:50    Last Pain:  Vitals:   01/11/24 0950  TempSrc: Oral  PainSc: 0-No pain         Complications: No notable events documented.

## 2024-01-11 NOTE — Anesthesia Preprocedure Evaluation (Signed)
"                                    Anesthesia Evaluation  Patient identified by MRN, date of birth, ID band Patient awake    Reviewed: Allergy & Precautions, H&P , NPO status , Patient's Chart, lab work & pertinent test results, reviewed documented beta blocker date and time   History of Anesthesia Complications (+) PONV and history of anesthetic complications  Airway Mallampati: II  TM Distance: >3 FB Neck ROM: full    Dental no notable dental hx.    Pulmonary neg pulmonary ROS, former smoker   Pulmonary exam normal breath sounds clear to auscultation       Cardiovascular Exercise Tolerance: Good hypertension,  Rhythm:regular Rate:Normal     Neuro/Psych  Headaches PSYCHIATRIC DISORDERS Anxiety Depression       GI/Hepatic Neg liver ROS, PUD,GERD  ,,  Endo/Other  diabetes    Renal/GU Renal disease  negative genitourinary   Musculoskeletal   Abdominal   Peds  Hematology  (+) Blood dyscrasia, anemia   Anesthesia Other Findings   Reproductive/Obstetrics negative OB ROS                              Anesthesia Physical Anesthesia Plan  ASA: 3  Anesthesia Plan: MAC   Post-op Pain Management:    Induction:   PONV Risk Score and Plan:   Airway Management Planned:   Additional Equipment:   Intra-op Plan:   Post-operative Plan:   Informed Consent: I have reviewed the patients History and Physical, chart, labs and discussed the procedure including the risks, benefits and alternatives for the proposed anesthesia with the patient or authorized representative who has indicated his/her understanding and acceptance.     Dental Advisory Given  Plan Discussed with: CRNA  Anesthesia Plan Comments:         Anesthesia Quick Evaluation  "

## 2024-01-12 ENCOUNTER — Encounter (HOSPITAL_COMMUNITY): Payer: Self-pay | Admitting: Ophthalmology

## 2024-01-15 NOTE — Anesthesia Postprocedure Evaluation (Signed)
"   Anesthesia Post Note  Patient: [name]  Procedure(s) Performed: PHACOEMULSIFICATION, CATARACT, WITH IOL INSERTION (Right: Eye)  Patient location during evaluation: Phase II Anesthesia Type: MAC Level of consciousness: awake Pain management: pain level controlled Vital Signs Assessment: post-procedure vital signs reviewed and stable Respiratory status: spontaneous breathing and respiratory function stable Cardiovascular status: blood pressure returned to baseline and stable Postop Assessment: no headache and no apparent nausea or vomiting Anesthetic complications: no Comments: Late entry   No notable events documented.   Last Vitals:  Vitals:   01/11/24 0846 01/11/24 0950  BP: (!) 181/96 (!) 184/97  Pulse: 93 88  Resp: 13 13  Temp: 36.7 C 37.1 C  SpO2: 100% 98%    Last Pain:  Vitals:   01/11/24 0950  TempSrc: Oral  PainSc: 0-No pain                 Yvonna PARAS Meagan Spease      "

## 2024-01-28 ENCOUNTER — Encounter (HOSPITAL_COMMUNITY)
Admission: RE | Admit: 2024-01-28 | Discharge: 2024-01-28 | Disposition: A | Discharge status: Home / Self Care | Source: Ambulatory Visit | Attending: Ophthalmology | Admitting: Ophthalmology

## 2024-01-28 ENCOUNTER — Encounter (HOSPITAL_COMMUNITY): Payer: Self-pay

## 2024-01-29 NOTE — H&P (Signed)
 Surgical History & Physical  Patient Name: [name]  DOB: 08-15-1960  Surgery: Cataract extraction with intraocular lens implant phacoemulsification; Left Eye Surgeon: Lynwood Hermann MD Surgery Date: 02/01/2024 Pre-Op Date: 01/18/2024  HPI: A 71 Yr. old male patient Here for 1 week PO OD. Patient states vision has improved greatly. Patient states colors are more vibrant. Patient denies new floaters or flashes. Patient using gtts as instructed. Patient here for pre-op OS patient states vision is dim, yellow and blurred. Blurred VA is constant OS. Patient states he is having difficulty with glare and poor night vision, difficulty at near, recognizing ppl far away and seeing captions. This is negatively affecting the patient's quality of life and the patient is unable to function adequately in life with the current level of vision. HPI Completed by Dr. Lynwood Hermann  Medical History:  Anxiety neuropathy Diabetes High Blood Pressure  Review of Systems Cardiovascular High Blood Pressure Psychiatry Anxiety  Social Never Smoked  Medication Prednisolone-moxiflox-bromfen, Prednisolone-moxiflox-bromfen,  Atorvastatin , Metformin , Hydralazine , Ondansetron  HCl, Amlodipine , Azelastine, Erythromycin , Alprazolam , Furosemide , Ondansetron , Gabapentin , Metoclopramide  HCl, Bupropion  HCl, Olmesartan, Januvia, Duloxetine , Lantus  Solostar U-100 Insulin , Linzess , Dexlansoprazole , Tiadylt  ER   Sx/Procedures Phaco c IOL OD,  Back Surgery, Toe Amputation, Gallbladder Removal   Drug Allergies  latex   History & Physical: Heent: cataract NECK: supple without bruits LUNGS: lungs clear to auscultation CV: regular rate and rhythm Abdomen: soft and non-tender  Impression & Plan: Assessment: 1.  CATARACT EXTRACTION STATUS; Right Eye (Z98.41) 2.  COMBINED FORMS AGE RELATED CATARACT; Left Eye (H25.812)  Plan: 1.  1 month after Phaco PCIOL. Doing very well with improved vision and normal  IOP. Continue Pred-Moxi-Brom 2x/day for 3 more weeks.  2.  Cataract accounts for the patient's decreased vision. This visual impairment is not correctable with a tolerable change in glasses or contact lenses. Cataract surgery with an implantation of a new lens should significantly improve the visual and functional status of the patient. Recommend phacoemulsification with intraocular lens. Discussed all risks, benefits, alternatives, and potential complications. Discussed the procedures and recovery. The patient desires to have surgery. A-scan/Biometry ordered and will be performed for intraocular lens calculations. The surgery will be performed in order to improve vision for driving, reading, and for eye examinations. Recommend Dextenza  for post-operative pain and inflammation. Educational materials provided: Cataract. History of corneal refractive Surgery: None History of Previous Ocular Surgery (PPV, other): None History of ocular trauma: None Use of Eye Pressure Lowering Drops: None No current contact lens use. Pupil Status: Dilates poorly - shugarcaine or Lidocaine +Omidira by protocol, Malyugin Ring Left Eye.

## 2024-02-01 ENCOUNTER — Ambulatory Visit (HOSPITAL_COMMUNITY): Admission: RE | Admit: 2024-02-01 | Source: Home / Self Care | Admitting: Ophthalmology

## 2024-02-01 ENCOUNTER — Encounter (HOSPITAL_COMMUNITY): Payer: Self-pay | Admitting: Anesthesiology

## 2024-02-01 ENCOUNTER — Encounter (HOSPITAL_COMMUNITY): Admission: RE | Payer: Self-pay | Source: Home / Self Care

## 2024-02-01 SURGERY — PHACOEMULSIFICATION, CATARACT, WITH IOL INSERTION
Anesthesia: Monitor Anesthesia Care | Laterality: Left

## 2024-02-01 NOTE — Progress Notes (Signed)
 Called pt to see if he was planning to come for his cataract surgery. He was supposed to be here at 1120. Left message to call back,

## 2024-02-22 NOTE — H&P (Signed)
 Surgical History & Physical  Patient Name: [name]  DOB: [birth year]  Surgery: Cataract extraction with intraocular lens implant phacoemulsification; Left Eye Surgeon: Lynwood Hermann MD Surgery Date: 02/26/2024 Pre-Op Date: 01/17/2025  HPI: A 54 Yr. old male patient Here for 1 week PO OD. Patient states vision has improved greatly. Patient states colors are more vibrant. Patient denies new floaters or flashes. Patient using gtts as instructed. Patient here for pre-op OS patient states vision is dim, yellow and blurred. Blurred VA is constant OS. Patient states he is having difficulty with glare and poor night vision, difficulty at near, recognizing ppl far away and seeing captions. This is negatively affecting the patient's quality of life and the patient is unable to function adequately in life with the current level of vision. HPI Completed by Dr. Lynwood Hermann  Medical History:  Anxiety neuropathy Diabetes High Blood Pressure  Review of Systems Cardiovascular High Blood Pressure Psychiatry Anxiety  Social Never Smoked  Medication Prednisolone-moxiflox-bromfen, Prednisolone-moxiflox-bromfen,  Atorvastatin , Metformin , Hydralazine , Ondansetron  HCl, Amlodipine , Azelastine, Erythromycin , Alprazolam , Furosemide , Ondansetron , Gabapentin , Metoclopramide  HCl, Bupropion  HCl, Olmesartan, Januvia, Duloxetine , Lantus  Solostar U-100 Insulin , Linzess , Dexlansoprazole , Tiadylt  ER  Sx/Procedures Phaco c IOL OD,  Back Surgery, Toe Amputation, Gallbladder Removal  Drug Allergies  latex   History & Physical: Heent: cataracts  NECK: supple without bruits LUNGS: lungs clear to auscultation CV: regular rate and rhythm Abdomen: soft and non-tender  Impression & Plan: Assessment: 1.  CATARACT EXTRACTION STATUS; Right Eye (Z98.41) 2.  COMBINED FORMS AGE RELATED CATARACT; Left Eye (H25.812)  Plan: 1.  1 month after Phaco PCIOL. Doing very well with improved vision and normal  IOP. Continue Pred-Moxi-Brom 2x/day for 3 more weeks.  2.  Cataract accounts for the patient's decreased vision. This visual impairment is not correctable with a tolerable change in glasses or contact lenses. Cataract surgery with an implantation of a new lens should significantly improve the visual and functional status of the patient. Recommend phacoemulsification with intraocular lens. Discussed all risks, benefits, alternatives, and potential complications. Discussed the procedures and recovery. The patient desires to have surgery. A-scan/Biometry ordered and will be performed for intraocular lens calculations. The surgery will be performed in order to improve vision for driving, reading, and for eye examinations. Recommend Dextenza  for post-operative pain and inflammation. Educational materials provided: Cataract. History of corneal refractive Surgery: None History of Previous Ocular Surgery (PPV, other): None History of ocular trauma: None Use of Eye Pressure Lowering Drops: None No current contact lens use. Pupil Status: Dilates poorly - shugarcaine or Lidocaine +Omidira by protocol, Malyugin Ring Left Eye.

## 2024-02-24 ENCOUNTER — Encounter (HOSPITAL_COMMUNITY): Admission: RE | Admit: 2024-02-24 | Discharge: 2024-02-24 | Attending: Ophthalmology

## 2024-02-24 NOTE — Pre-Procedure Instructions (Signed)
 Attempted pre-op phone call. Patient states he needs to reschedule his procedure due to a death in his family. Office messaged to let them know.

## 2024-02-26 ENCOUNTER — Encounter (HOSPITAL_COMMUNITY): Admission: RE | Payer: Self-pay | Source: Home / Self Care

## 2024-02-26 ENCOUNTER — Ambulatory Visit (HOSPITAL_COMMUNITY): Admission: RE | Admit: 2024-02-26 | Source: Home / Self Care | Admitting: Ophthalmology
